# Patient Record
Sex: Female | Born: 1935 | Race: White | Hispanic: No | Smoking: Former smoker
Health system: Southern US, Community
[De-identification: ages and names within clinical notes are randomized; demographics above are authoritative.]

## PROBLEM LIST (undated history)

## (undated) DIAGNOSIS — R911 Solitary pulmonary nodule: Secondary | ICD-10-CM

## (undated) DIAGNOSIS — C50919 Malignant neoplasm of unspecified site of unspecified female breast: Secondary | ICD-10-CM

## (undated) DIAGNOSIS — M1990 Unspecified osteoarthritis, unspecified site: Secondary | ICD-10-CM

## (undated) DIAGNOSIS — G2581 Restless legs syndrome: Secondary | ICD-10-CM

## (undated) DIAGNOSIS — Z860100 Personal history of colon polyps, unspecified: Secondary | ICD-10-CM

## (undated) DIAGNOSIS — E785 Hyperlipidemia, unspecified: Secondary | ICD-10-CM

## (undated) DIAGNOSIS — K219 Gastro-esophageal reflux disease without esophagitis: Secondary | ICD-10-CM

## (undated) DIAGNOSIS — Z9889 Other specified postprocedural states: Secondary | ICD-10-CM

## (undated) DIAGNOSIS — K5790 Diverticulosis of intestine, part unspecified, without perforation or abscess without bleeding: Secondary | ICD-10-CM

## (undated) DIAGNOSIS — Z973 Presence of spectacles and contact lenses: Secondary | ICD-10-CM

## (undated) DIAGNOSIS — G43909 Migraine, unspecified, not intractable, without status migrainosus: Secondary | ICD-10-CM

## (undated) HISTORY — PX: CYSTOCELE REPAIR: SHX163

## (undated) HISTORY — DX: Malignant neoplasm of unspecified site of unspecified female breast: C50.919

## (undated) HISTORY — DX: Restless legs syndrome: G25.81

## (undated) HISTORY — PX: HEMORRHOID SURGERY: SHX153

## (undated) HISTORY — DX: Unspecified osteoarthritis, unspecified site: M19.90

## (undated) HISTORY — DX: Migraine, unspecified, not intractable, without status migrainosus: G43.909

## (undated) HISTORY — DX: Personal history of colonic polyps: Z86.010

## (undated) HISTORY — PX: APPENDECTOMY: SHX54

## (undated) HISTORY — PX: HYSTEROSCOPY: SHX211

## (undated) HISTORY — DX: Diverticulosis of intestine, part unspecified, without perforation or abscess without bleeding: K57.90

## (undated) HISTORY — DX: Other specified postprocedural states: Z98.890

## (undated) HISTORY — DX: Gastro-esophageal reflux disease without esophagitis: K21.9

## (undated) HISTORY — DX: Personal history of colon polyps, unspecified: Z86.0100

## (undated) HISTORY — PX: LYMPH NODE BIOPSY: SHX201

## (undated) HISTORY — PX: OTHER SURGICAL HISTORY: SHX169

## (undated) HISTORY — PX: ERCP: SHX60

## (undated) HISTORY — PX: ABDOMINAL HYSTERECTOMY: SHX81

## (undated) HISTORY — DX: Solitary pulmonary nodule: R91.1

## (undated) HISTORY — PX: SPHINCTEROTOMY: SHX5279

## (undated) HISTORY — DX: Hyperlipidemia, unspecified: E78.5

---

## 1993-09-16 HISTORY — PX: BREAST LUMPECTOMY: SHX2

## 1998-06-12 ENCOUNTER — Ambulatory Visit (HOSPITAL_COMMUNITY): Admission: RE | Admit: 1998-06-12 | Discharge: 1998-06-12 | Payer: Self-pay | Admitting: Oncology

## 1998-06-12 ENCOUNTER — Encounter: Payer: Self-pay | Admitting: Oncology

## 1999-03-15 ENCOUNTER — Encounter (INDEPENDENT_AMBULATORY_CARE_PROVIDER_SITE_OTHER): Payer: Self-pay | Admitting: Specialist

## 1999-03-15 ENCOUNTER — Other Ambulatory Visit: Admission: RE | Admit: 1999-03-15 | Discharge: 1999-03-15 | Payer: Self-pay | Admitting: Internal Medicine

## 1999-05-09 ENCOUNTER — Other Ambulatory Visit: Admission: RE | Admit: 1999-05-09 | Discharge: 1999-05-09 | Payer: Self-pay | Admitting: Obstetrics and Gynecology

## 1999-05-09 ENCOUNTER — Encounter (INDEPENDENT_AMBULATORY_CARE_PROVIDER_SITE_OTHER): Payer: Self-pay

## 1999-05-28 ENCOUNTER — Encounter (INDEPENDENT_AMBULATORY_CARE_PROVIDER_SITE_OTHER): Payer: Self-pay | Admitting: Specialist

## 1999-05-28 ENCOUNTER — Ambulatory Visit (HOSPITAL_COMMUNITY): Admission: RE | Admit: 1999-05-28 | Discharge: 1999-05-28 | Payer: Self-pay | Admitting: Obstetrics and Gynecology

## 1999-05-28 ENCOUNTER — Encounter: Payer: Self-pay | Admitting: Obstetrics and Gynecology

## 1999-06-14 ENCOUNTER — Encounter: Payer: Self-pay | Admitting: Oncology

## 1999-06-14 ENCOUNTER — Ambulatory Visit (HOSPITAL_COMMUNITY): Admission: RE | Admit: 1999-06-14 | Discharge: 1999-06-14 | Payer: Self-pay | Admitting: Oncology

## 2000-02-26 ENCOUNTER — Encounter: Payer: Self-pay | Admitting: Surgery

## 2000-02-26 ENCOUNTER — Encounter: Admission: RE | Admit: 2000-02-26 | Discharge: 2000-02-26 | Payer: Self-pay | Admitting: Surgery

## 2000-05-02 ENCOUNTER — Encounter: Payer: Self-pay | Admitting: Internal Medicine

## 2000-05-02 ENCOUNTER — Ambulatory Visit (HOSPITAL_COMMUNITY): Admission: RE | Admit: 2000-05-02 | Discharge: 2000-05-02 | Payer: Self-pay | Admitting: Internal Medicine

## 2000-08-12 ENCOUNTER — Ambulatory Visit (HOSPITAL_COMMUNITY): Admission: RE | Admit: 2000-08-12 | Discharge: 2000-08-12 | Payer: Self-pay | Admitting: Internal Medicine

## 2000-08-12 ENCOUNTER — Encounter: Payer: Self-pay | Admitting: Internal Medicine

## 2000-08-20 ENCOUNTER — Inpatient Hospital Stay (HOSPITAL_COMMUNITY): Admission: RE | Admit: 2000-08-20 | Discharge: 2000-08-23 | Payer: Self-pay | Admitting: Obstetrics and Gynecology

## 2000-08-20 ENCOUNTER — Encounter (INDEPENDENT_AMBULATORY_CARE_PROVIDER_SITE_OTHER): Payer: Self-pay | Admitting: Specialist

## 2000-11-13 ENCOUNTER — Ambulatory Visit (HOSPITAL_COMMUNITY): Admission: RE | Admit: 2000-11-13 | Discharge: 2000-11-13 | Payer: Self-pay | Admitting: Internal Medicine

## 2000-11-13 ENCOUNTER — Encounter: Payer: Self-pay | Admitting: Internal Medicine

## 2001-04-06 ENCOUNTER — Ambulatory Visit (HOSPITAL_COMMUNITY): Admission: RE | Admit: 2001-04-06 | Discharge: 2001-04-06 | Payer: Self-pay | Admitting: Internal Medicine

## 2001-04-06 ENCOUNTER — Encounter: Payer: Self-pay | Admitting: Internal Medicine

## 2001-08-13 ENCOUNTER — Emergency Department (HOSPITAL_COMMUNITY): Admission: EM | Admit: 2001-08-13 | Discharge: 2001-08-13 | Payer: Self-pay | Admitting: Emergency Medicine

## 2001-08-26 ENCOUNTER — Encounter: Payer: Self-pay | Admitting: Internal Medicine

## 2001-08-26 ENCOUNTER — Ambulatory Visit (HOSPITAL_COMMUNITY): Admission: RE | Admit: 2001-08-26 | Discharge: 2001-08-26 | Payer: Self-pay | Admitting: Internal Medicine

## 2001-11-14 HISTORY — PX: CERVICAL SPINE SURGERY: SHX589

## 2001-11-16 ENCOUNTER — Encounter: Payer: Self-pay | Admitting: Neurosurgery

## 2001-11-19 ENCOUNTER — Inpatient Hospital Stay (HOSPITAL_COMMUNITY): Admission: RE | Admit: 2001-11-19 | Discharge: 2001-11-20 | Payer: Self-pay | Admitting: Neurosurgery

## 2001-11-19 ENCOUNTER — Encounter: Payer: Self-pay | Admitting: Neurosurgery

## 2001-12-16 ENCOUNTER — Encounter: Admission: RE | Admit: 2001-12-16 | Discharge: 2001-12-16 | Payer: Self-pay | Admitting: Neurosurgery

## 2001-12-16 ENCOUNTER — Encounter: Payer: Self-pay | Admitting: Neurosurgery

## 2002-03-30 ENCOUNTER — Encounter: Payer: Self-pay | Admitting: Neurosurgery

## 2002-03-30 ENCOUNTER — Encounter: Admission: RE | Admit: 2002-03-30 | Discharge: 2002-03-30 | Payer: Self-pay | Admitting: Neurosurgery

## 2002-07-30 ENCOUNTER — Ambulatory Visit (HOSPITAL_COMMUNITY): Admission: RE | Admit: 2002-07-30 | Discharge: 2002-07-30 | Payer: Self-pay | Admitting: Internal Medicine

## 2002-07-30 ENCOUNTER — Encounter: Payer: Self-pay | Admitting: Internal Medicine

## 2003-10-28 ENCOUNTER — Ambulatory Visit (HOSPITAL_COMMUNITY): Admission: RE | Admit: 2003-10-28 | Discharge: 2003-10-28 | Payer: Self-pay | Admitting: Internal Medicine

## 2003-10-28 ENCOUNTER — Encounter: Payer: Self-pay | Admitting: Internal Medicine

## 2003-11-08 ENCOUNTER — Ambulatory Visit (HOSPITAL_COMMUNITY): Admission: RE | Admit: 2003-11-08 | Discharge: 2003-11-08 | Payer: Self-pay | Admitting: Internal Medicine

## 2004-01-04 ENCOUNTER — Encounter: Payer: Self-pay | Admitting: Gastroenterology

## 2004-01-04 ENCOUNTER — Observation Stay (HOSPITAL_COMMUNITY): Admission: AD | Admit: 2004-01-04 | Discharge: 2004-01-05 | Payer: Self-pay | Admitting: Gastroenterology

## 2004-04-16 ENCOUNTER — Emergency Department (HOSPITAL_COMMUNITY): Admission: EM | Admit: 2004-04-16 | Discharge: 2004-04-16 | Payer: Self-pay | Admitting: Emergency Medicine

## 2004-08-01 ENCOUNTER — Ambulatory Visit: Payer: Self-pay | Admitting: Internal Medicine

## 2004-09-16 HISTORY — PX: OTHER SURGICAL HISTORY: SHX169

## 2004-11-16 ENCOUNTER — Ambulatory Visit: Payer: Self-pay | Admitting: Internal Medicine

## 2005-03-01 ENCOUNTER — Ambulatory Visit: Payer: Self-pay | Admitting: Internal Medicine

## 2005-03-12 ENCOUNTER — Ambulatory Visit (HOSPITAL_COMMUNITY): Admission: RE | Admit: 2005-03-12 | Discharge: 2005-03-12 | Payer: Self-pay | Admitting: Internal Medicine

## 2005-03-12 ENCOUNTER — Ambulatory Visit: Payer: Self-pay | Admitting: Internal Medicine

## 2005-06-20 ENCOUNTER — Ambulatory Visit: Payer: Self-pay | Admitting: Internal Medicine

## 2005-08-13 ENCOUNTER — Ambulatory Visit: Payer: Self-pay | Admitting: Internal Medicine

## 2005-11-12 ENCOUNTER — Ambulatory Visit: Payer: Self-pay | Admitting: Internal Medicine

## 2005-11-18 ENCOUNTER — Ambulatory Visit: Payer: Self-pay | Admitting: Internal Medicine

## 2006-02-07 ENCOUNTER — Ambulatory Visit: Payer: Self-pay | Admitting: Internal Medicine

## 2006-03-31 ENCOUNTER — Ambulatory Visit: Payer: Self-pay | Admitting: Internal Medicine

## 2006-04-07 ENCOUNTER — Ambulatory Visit: Payer: Self-pay | Admitting: Internal Medicine

## 2006-04-28 ENCOUNTER — Ambulatory Visit: Payer: Self-pay | Admitting: Internal Medicine

## 2006-07-11 ENCOUNTER — Ambulatory Visit: Payer: Self-pay | Admitting: Internal Medicine

## 2006-07-30 ENCOUNTER — Ambulatory Visit: Payer: Self-pay | Admitting: Internal Medicine

## 2006-07-30 LAB — CONVERTED CEMR LAB: Cholesterol: 187 mg/dL (ref 0–200)

## 2006-08-13 ENCOUNTER — Ambulatory Visit: Payer: Self-pay | Admitting: Internal Medicine

## 2006-10-03 ENCOUNTER — Ambulatory Visit: Payer: Self-pay | Admitting: Internal Medicine

## 2007-01-12 ENCOUNTER — Ambulatory Visit: Payer: Self-pay | Admitting: Internal Medicine

## 2007-01-12 LAB — CONVERTED CEMR LAB
Alkaline Phosphatase: 84 units/L (ref 39–117)
Lipase: 23 units/L (ref 11.0–59.0)
Total Bilirubin: 1 mg/dL (ref 0.3–1.2)
Total Protein: 7 g/dL (ref 6.0–8.3)

## 2007-01-15 ENCOUNTER — Ambulatory Visit: Payer: Self-pay | Admitting: Internal Medicine

## 2007-05-28 DIAGNOSIS — Z860100 Personal history of colon polyps, unspecified: Secondary | ICD-10-CM | POA: Insufficient documentation

## 2007-05-28 DIAGNOSIS — K219 Gastro-esophageal reflux disease without esophagitis: Secondary | ICD-10-CM | POA: Insufficient documentation

## 2007-05-28 DIAGNOSIS — Z853 Personal history of malignant neoplasm of breast: Secondary | ICD-10-CM

## 2007-06-01 ENCOUNTER — Ambulatory Visit: Payer: Self-pay | Admitting: Internal Medicine

## 2007-06-01 DIAGNOSIS — E785 Hyperlipidemia, unspecified: Secondary | ICD-10-CM | POA: Insufficient documentation

## 2007-06-01 DIAGNOSIS — G4700 Insomnia, unspecified: Secondary | ICD-10-CM | POA: Insufficient documentation

## 2007-06-01 LAB — CONVERTED CEMR LAB
Blood in Urine, dipstick: NEGATIVE
Nitrite: NEGATIVE
Protein, U semiquant: NEGATIVE
Specific Gravity, Urine: 1.02
WBC Urine, dipstick: NEGATIVE

## 2007-06-08 ENCOUNTER — Telehealth: Payer: Self-pay | Admitting: Internal Medicine

## 2007-06-11 LAB — CONVERTED CEMR LAB
ALT: 27 units/L (ref 0–35)
AST: 29 units/L (ref 0–37)
Basophils Relative: 0.7 % (ref 0.0–1.0)
Bilirubin, Direct: 0.1 mg/dL (ref 0.0–0.3)
CO2: 32 meq/L (ref 19–32)
Calcium: 9.6 mg/dL (ref 8.4–10.5)
Chloride: 110 meq/L (ref 96–112)
Eosinophils Relative: 4.8 % (ref 0.0–5.0)
Glucose, Bld: 105 mg/dL — ABNORMAL HIGH (ref 70–99)
Platelets: 222 10*3/uL (ref 150–400)
RBC: 4.16 M/uL (ref 3.87–5.11)
RDW: 12.9 % (ref 11.5–14.6)
Total CHOL/HDL Ratio: 3
Total Protein: 6.2 g/dL (ref 6.0–8.3)
Triglycerides: 84 mg/dL (ref 0–149)
VLDL: 17 mg/dL (ref 0–40)
WBC: 5.8 10*3/uL (ref 4.5–10.5)

## 2007-07-15 ENCOUNTER — Ambulatory Visit: Payer: Self-pay | Admitting: Internal Medicine

## 2007-07-15 DIAGNOSIS — R634 Abnormal weight loss: Secondary | ICD-10-CM

## 2007-07-20 ENCOUNTER — Ambulatory Visit: Payer: Self-pay | Admitting: Cardiovascular Disease

## 2007-08-04 ENCOUNTER — Telehealth: Payer: Self-pay | Admitting: *Deleted

## 2007-08-31 ENCOUNTER — Ambulatory Visit: Payer: Self-pay | Admitting: Internal Medicine

## 2007-08-31 DIAGNOSIS — F4322 Adjustment disorder with anxiety: Secondary | ICD-10-CM

## 2007-10-07 ENCOUNTER — Ambulatory Visit: Payer: Self-pay | Admitting: Internal Medicine

## 2007-11-11 ENCOUNTER — Telehealth: Payer: Self-pay | Admitting: Internal Medicine

## 2007-11-19 ENCOUNTER — Telehealth: Payer: Self-pay | Admitting: *Deleted

## 2007-11-27 ENCOUNTER — Encounter: Payer: Self-pay | Admitting: Internal Medicine

## 2008-01-28 ENCOUNTER — Telehealth: Payer: Self-pay | Admitting: Internal Medicine

## 2008-01-29 ENCOUNTER — Telehealth: Payer: Self-pay | Admitting: Internal Medicine

## 2008-02-29 ENCOUNTER — Telehealth: Payer: Self-pay | Admitting: Internal Medicine

## 2008-04-18 ENCOUNTER — Ambulatory Visit: Payer: Self-pay | Admitting: Internal Medicine

## 2008-04-18 DIAGNOSIS — L255 Unspecified contact dermatitis due to plants, except food: Secondary | ICD-10-CM

## 2008-04-20 ENCOUNTER — Encounter: Payer: Self-pay | Admitting: Internal Medicine

## 2008-05-03 ENCOUNTER — Telehealth: Payer: Self-pay | Admitting: Internal Medicine

## 2008-05-11 ENCOUNTER — Telehealth: Payer: Self-pay | Admitting: Internal Medicine

## 2008-07-06 ENCOUNTER — Ambulatory Visit: Payer: Self-pay | Admitting: Internal Medicine

## 2008-07-06 DIAGNOSIS — M542 Cervicalgia: Secondary | ICD-10-CM

## 2008-07-06 DIAGNOSIS — R51 Headache: Secondary | ICD-10-CM

## 2008-07-06 DIAGNOSIS — R519 Headache, unspecified: Secondary | ICD-10-CM | POA: Insufficient documentation

## 2008-07-06 DIAGNOSIS — M25559 Pain in unspecified hip: Secondary | ICD-10-CM

## 2008-07-06 DIAGNOSIS — M549 Dorsalgia, unspecified: Secondary | ICD-10-CM | POA: Insufficient documentation

## 2008-07-08 ENCOUNTER — Telehealth: Payer: Self-pay | Admitting: Internal Medicine

## 2008-07-08 LAB — CONVERTED CEMR LAB
ALT: 21 units/L (ref 0–35)
AST: 25 units/L (ref 0–37)
Alkaline Phosphatase: 85 units/L (ref 39–117)
Bilirubin, Direct: 0.1 mg/dL (ref 0.0–0.3)
CO2: 33 meq/L — ABNORMAL HIGH (ref 19–32)
Calcium: 9.5 mg/dL (ref 8.4–10.5)
Chloride: 105 meq/L (ref 96–112)
Glucose, Bld: 91 mg/dL (ref 70–99)
Hemoglobin: 14 g/dL (ref 12.0–15.0)
LDL Cholesterol: 88 mg/dL (ref 0–99)
Lymphocytes Relative: 18.6 % (ref 12.0–46.0)
Monocytes Relative: 9.6 % (ref 3.0–12.0)
Neutro Abs: 4.1 10*3/uL (ref 1.4–7.7)
Neutrophils Relative %: 64.6 % (ref 43.0–77.0)
RBC: 4.32 M/uL (ref 3.87–5.11)
RDW: 12.2 % (ref 11.5–14.6)
Sodium: 144 meq/L (ref 135–145)
Total CHOL/HDL Ratio: 2.6
Total Protein: 6.6 g/dL (ref 6.0–8.3)
Triglycerides: 114 mg/dL (ref 0–149)

## 2008-07-13 ENCOUNTER — Ambulatory Visit: Payer: Self-pay | Admitting: Internal Medicine

## 2008-07-19 ENCOUNTER — Telehealth: Payer: Self-pay | Admitting: Internal Medicine

## 2008-07-20 ENCOUNTER — Ambulatory Visit: Payer: Self-pay | Admitting: Internal Medicine

## 2008-07-21 ENCOUNTER — Telehealth: Payer: Self-pay | Admitting: Internal Medicine

## 2008-07-25 ENCOUNTER — Telehealth: Payer: Self-pay | Admitting: Internal Medicine

## 2008-08-04 DIAGNOSIS — K589 Irritable bowel syndrome without diarrhea: Secondary | ICD-10-CM | POA: Insufficient documentation

## 2008-08-04 DIAGNOSIS — K5730 Diverticulosis of large intestine without perforation or abscess without bleeding: Secondary | ICD-10-CM | POA: Insufficient documentation

## 2008-08-04 DIAGNOSIS — K222 Esophageal obstruction: Secondary | ICD-10-CM

## 2008-08-05 ENCOUNTER — Ambulatory Visit: Payer: Self-pay | Admitting: Internal Medicine

## 2008-08-08 ENCOUNTER — Telehealth: Payer: Self-pay | Admitting: *Deleted

## 2008-08-15 ENCOUNTER — Encounter: Payer: Self-pay | Admitting: Internal Medicine

## 2008-08-29 ENCOUNTER — Ambulatory Visit: Payer: Self-pay | Admitting: Internal Medicine

## 2008-09-01 ENCOUNTER — Telehealth: Payer: Self-pay | Admitting: Internal Medicine

## 2008-09-12 ENCOUNTER — Telehealth: Payer: Self-pay | Admitting: Internal Medicine

## 2008-09-13 ENCOUNTER — Encounter: Payer: Self-pay | Admitting: Internal Medicine

## 2008-11-08 ENCOUNTER — Telehealth: Payer: Self-pay | Admitting: *Deleted

## 2008-11-22 ENCOUNTER — Telehealth: Payer: Self-pay | Admitting: *Deleted

## 2008-12-08 ENCOUNTER — Encounter: Payer: Self-pay | Admitting: Internal Medicine

## 2008-12-21 ENCOUNTER — Encounter: Payer: Self-pay | Admitting: Internal Medicine

## 2008-12-30 ENCOUNTER — Telehealth: Payer: Self-pay | Admitting: Internal Medicine

## 2009-01-27 ENCOUNTER — Encounter: Payer: Self-pay | Admitting: Internal Medicine

## 2009-02-21 ENCOUNTER — Ambulatory Visit: Payer: Self-pay | Admitting: Internal Medicine

## 2009-02-21 DIAGNOSIS — R195 Other fecal abnormalities: Secondary | ICD-10-CM

## 2009-02-22 LAB — CONVERTED CEMR LAB
ALT: 24 units/L (ref 0–35)
AST: 27 units/L (ref 0–37)
Albumin: 4.1 g/dL (ref 3.5–5.2)
Alkaline Phosphatase: 76 units/L (ref 39–117)
Bilirubin, Direct: 0.2 mg/dL (ref 0.0–0.3)
Total Protein: 6.9 g/dL (ref 6.0–8.3)

## 2009-04-14 ENCOUNTER — Telehealth: Payer: Self-pay | Admitting: *Deleted

## 2009-06-13 ENCOUNTER — Telehealth: Payer: Self-pay | Admitting: *Deleted

## 2009-06-21 ENCOUNTER — Telehealth: Payer: Self-pay | Admitting: Internal Medicine

## 2009-07-04 ENCOUNTER — Ambulatory Visit: Payer: Self-pay | Admitting: Internal Medicine

## 2009-08-02 ENCOUNTER — Telehealth: Payer: Self-pay | Admitting: *Deleted

## 2009-10-27 ENCOUNTER — Ambulatory Visit: Payer: Self-pay | Admitting: Internal Medicine

## 2009-10-27 DIAGNOSIS — M1990 Unspecified osteoarthritis, unspecified site: Secondary | ICD-10-CM | POA: Insufficient documentation

## 2009-10-27 LAB — CONVERTED CEMR LAB
Albumin: 3.9 g/dL (ref 3.5–5.2)
BUN: 20 mg/dL (ref 6–23)
Basophils Absolute: 0 10*3/uL (ref 0.0–0.1)
Basophils Relative: 0.6 % (ref 0.0–3.0)
CO2: 31 meq/L (ref 19–32)
Chloride: 105 meq/L (ref 96–112)
Cholesterol: 178 mg/dL (ref 0–200)
Creatinine, Ser: 0.9 mg/dL (ref 0.4–1.2)
Eosinophils Absolute: 0.3 10*3/uL (ref 0.0–0.7)
Hemoglobin: 13.6 g/dL (ref 12.0–15.0)
LDL Cholesterol: 87 mg/dL (ref 0–99)
Lymphocytes Relative: 26.9 % (ref 12.0–46.0)
Monocytes Relative: 10.2 % (ref 3.0–12.0)
Neutro Abs: 3 10*3/uL (ref 1.4–7.7)
Neutrophils Relative %: 56.5 % (ref 43.0–77.0)
Potassium: 4.5 meq/L (ref 3.5–5.1)
RBC: 4.28 M/uL (ref 3.87–5.11)
TSH: 3.37 microintl units/mL (ref 0.35–5.50)
Total CHOL/HDL Ratio: 2
Total Protein: 6.4 g/dL (ref 6.0–8.3)
Triglycerides: 60 mg/dL (ref 0.0–149.0)
VLDL: 12 mg/dL (ref 0.0–40.0)

## 2009-11-29 ENCOUNTER — Ambulatory Visit: Payer: Self-pay | Admitting: Internal Medicine

## 2009-12-11 ENCOUNTER — Encounter: Payer: Self-pay | Admitting: Internal Medicine

## 2009-12-22 ENCOUNTER — Ambulatory Visit: Payer: Self-pay | Admitting: Internal Medicine

## 2009-12-22 LAB — CONVERTED CEMR LAB
OCCULT 1: NEGATIVE
OCCULT 2: NEGATIVE
OCCULT 3: NEGATIVE

## 2009-12-26 ENCOUNTER — Encounter: Payer: Self-pay | Admitting: *Deleted

## 2010-01-26 ENCOUNTER — Telehealth: Payer: Self-pay | Admitting: *Deleted

## 2010-01-29 ENCOUNTER — Telehealth: Payer: Self-pay | Admitting: *Deleted

## 2010-01-30 ENCOUNTER — Encounter: Payer: Self-pay | Admitting: Internal Medicine

## 2010-01-31 ENCOUNTER — Telehealth: Payer: Self-pay | Admitting: Internal Medicine

## 2010-01-31 ENCOUNTER — Encounter: Payer: Self-pay | Admitting: Internal Medicine

## 2010-02-07 ENCOUNTER — Encounter (INDEPENDENT_AMBULATORY_CARE_PROVIDER_SITE_OTHER): Payer: Self-pay | Admitting: *Deleted

## 2010-02-28 ENCOUNTER — Telehealth: Payer: Self-pay | Admitting: *Deleted

## 2010-03-06 ENCOUNTER — Ambulatory Visit: Payer: Self-pay | Admitting: Internal Medicine

## 2010-03-06 DIAGNOSIS — R209 Unspecified disturbances of skin sensation: Secondary | ICD-10-CM | POA: Insufficient documentation

## 2010-03-07 ENCOUNTER — Ambulatory Visit: Payer: Self-pay | Admitting: Internal Medicine

## 2010-03-26 ENCOUNTER — Telehealth: Payer: Self-pay | Admitting: *Deleted

## 2010-03-28 ENCOUNTER — Encounter: Admission: RE | Admit: 2010-03-28 | Discharge: 2010-03-28 | Payer: Self-pay | Admitting: Neurosurgery

## 2010-04-03 ENCOUNTER — Encounter: Admission: RE | Admit: 2010-04-03 | Discharge: 2010-04-03 | Payer: Self-pay | Admitting: Neurosurgery

## 2010-06-06 ENCOUNTER — Ambulatory Visit: Payer: Self-pay | Admitting: Internal Medicine

## 2010-06-12 LAB — CONVERTED CEMR LAB
AST: 27 units/L (ref 0–37)
Albumin: 4 g/dL (ref 3.5–5.2)
Alkaline Phosphatase: 113 units/L (ref 39–117)
Total Protein: 6.3 g/dL (ref 6.0–8.3)

## 2010-06-18 ENCOUNTER — Telehealth: Payer: Self-pay | Admitting: *Deleted

## 2010-07-02 ENCOUNTER — Ambulatory Visit: Payer: Self-pay | Admitting: Internal Medicine

## 2010-07-02 DIAGNOSIS — R1011 Right upper quadrant pain: Secondary | ICD-10-CM

## 2010-07-02 LAB — CONVERTED CEMR LAB
ALT: 29 units/L (ref 0–35)
Basophils Absolute: 0 10*3/uL (ref 0.0–0.1)
Basophils Relative: 0.6 % (ref 0.0–3.0)
Eosinophils Absolute: 0.3 10*3/uL (ref 0.0–0.7)
Glucose, Urine, Semiquant: NEGATIVE
Lipase: 30 units/L (ref 11.0–59.0)
Lymphocytes Relative: 24.1 % (ref 12.0–46.0)
MCHC: 34.3 g/dL (ref 30.0–36.0)
MCV: 94.3 fL (ref 78.0–100.0)
Monocytes Absolute: 0.7 10*3/uL (ref 0.1–1.0)
Neutrophils Relative %: 60 % (ref 43.0–77.0)
Nitrite: NEGATIVE
Platelets: 206 10*3/uL (ref 150.0–400.0)
RBC: 4.31 M/uL (ref 3.87–5.11)
RDW: 13.9 % (ref 11.5–14.6)
Specific Gravity, Urine: 1.015
Total Bilirubin: 0.6 mg/dL (ref 0.3–1.2)
WBC Urine, dipstick: NEGATIVE
pH: 7

## 2010-07-06 ENCOUNTER — Encounter: Admission: RE | Admit: 2010-07-06 | Discharge: 2010-07-06 | Payer: Self-pay | Admitting: Internal Medicine

## 2010-07-19 ENCOUNTER — Ambulatory Visit: Payer: Self-pay | Admitting: Internal Medicine

## 2010-08-27 ENCOUNTER — Encounter: Payer: Self-pay | Admitting: Internal Medicine

## 2010-08-27 ENCOUNTER — Telehealth: Payer: Self-pay | Admitting: Internal Medicine

## 2010-08-28 ENCOUNTER — Telehealth: Payer: Self-pay | Admitting: *Deleted

## 2010-09-16 HISTORY — PX: CHOLECYSTECTOMY: SHX55

## 2010-10-08 LAB — COMPREHENSIVE METABOLIC PANEL
Alkaline Phosphatase: 100 U/L (ref 39–117)
BUN: 15 mg/dL (ref 6–23)
CO2: 28 mEq/L (ref 19–32)
Chloride: 104 mEq/L (ref 96–112)
Creatinine, Ser: 0.96 mg/dL (ref 0.4–1.2)
GFR calc non Af Amer: 57 mL/min — ABNORMAL LOW (ref >60)
Glucose, Bld: 92 mg/dL (ref 70–99)
Potassium: 5.1 mEq/L (ref 3.5–5.1)
Total Bilirubin: 0.4 mg/dL (ref 0.3–1.2)

## 2010-10-08 LAB — CBC
HCT: 40.8 % (ref 36.0–46.0)
MCH: 31.1 pg (ref 26.0–34.0)
MCHC: 33.1 g/dL (ref 30.0–36.0)
MCV: 94 fL (ref 78.0–100.0)
RDW: 13.5 % (ref 11.5–15.5)

## 2010-10-08 LAB — SURGICAL PCR SCREEN: MRSA, PCR: NEGATIVE

## 2010-10-08 LAB — DIFFERENTIAL
Basophils Absolute: 0 10*3/uL (ref 0.0–0.1)
Eosinophils Relative: 4 % (ref 0–5)
Lymphocytes Relative: 25 % (ref 12–46)
Lymphs Abs: 1.5 10*3/uL (ref 0.7–4.0)
Monocytes Absolute: 0.6 10*3/uL (ref 0.1–1.0)
Monocytes Relative: 11 % (ref 3–12)

## 2010-10-12 ENCOUNTER — Ambulatory Visit (HOSPITAL_COMMUNITY)
Admission: RE | Admit: 2010-10-12 | Discharge: 2010-10-14 | Payer: Self-pay | Source: Home / Self Care | Attending: General Surgery | Admitting: General Surgery

## 2010-10-16 NOTE — Op Note (Signed)
Jeanne Tran, Jeanne Tran            ACCOUNT NO.:  [id number]  MEDICAL RECORD NO.:  [id number]          PATIENT TYPE:  AMB  LOCATION:  DAY                          FACILITY:  [facility]  PHYSICIAN:  [name], M.D.DATE OF BIRTH:  1934-08-15  DATE OF PROCEDURE: DATE OF DISCHARGE:                              OPERATIVE REPORT   PREOPERATIVE DIAGNOSIS:  Symptomatic cholelithiasis.  POSTOPERATIVE DIAGNOSIS:  Symptomatic cholelithiasis.  PROCEDURE:  Laparoscopic cholecystectomy with cholangiogram.  SURGEON:  [name], M.D.  ASSISTANT:  Jeanella Anton, MD  ANESTHESIA:  General.  INDICATIONS:  Ms. Batch is a 77 year old female who has a history of problems with choledocholithiasis.  In 2006, she underwent an ERCP and sphincterotomy.  She has known gallstones and common bile duct diameter is chronically dilated about 10 mm.  Liver function tests were normal. She now presents for elective cholecystectomy.  We discussed the procedure, risks and aftercare preoperatively.  TECHNIQUE:  She was brought to the operating room, placed supine on the operating table, and a general anesthetic was administered.  The abdominal wall was widely sterilely prepped and draped.  In the subumbilical region, Marcaine solution was infiltrated with a local anesthetic effect superficially deep.  A small subumbilical incision was made through the skin and subcutaneous tissues where the fascia was identified.  A small incision was made in the midline fascia. The fascia was grasped and retracted anteriorly.  The peritoneal cavity was then entered under direct vision.  A pursestring suture of 0 Vicryl was placed around the fascial edges.  A Hasson trocar was introduced to the peritoneal cavity, and pneumoperitoneum created by insufflation of CO2 gas.  Next the laparoscope was introduced, and there was no underlying bleeding or organ injury noted.  She was placed in reverse Trendelenburg  position with the right side tilted slightly up.  A 5-mm trocar was placed through subxiphoid incision and two 5-mm trocars placed in the right upper quadrant.  The gallbladder was identified, and there were no acute inflammatory changes.  The fundus of the gallbladder was grasped and retracted to the right shoulder.  The infundibulum was grasped using blunt dissection on the infundibulum, and I mobilized it and it was retracted laterally.  I identified the cystic duct, and using blunt dissection, created a window around it and a critical view was achieved.  A clip was placed at the cystic duct gallbladder junction.  A small incision was made into the cystic duct, and bile milked back.  A cholangiocath was passed to the anterior abdominal wall and placed into the cystic duct and cholangiogram performed.  Under real time fluoroscopy, dilute contrast was injected into the cystic duct.  The common hepatic, right and left hepatic, and common bile ducts all filled promptly.  There was some dilatation of common bile duct.  Contrast drained into the duodenum without obvious evidence of obstruction.  There was some scalloping of the distal common bile duct.  Final reports pending the radiologist's interpretation.  Following this, I removed the cholangiocatheter, the cystic duct was clipped three times on the biliary side, and it was divided.  I identified an anterior branch of  the cystic artery and created a window around, and it was clipped and divided.  A posterior branch of the cystic artery was identified.  A window was created around it, and it was clipped and divided.  The gallbladder was then dissected from the liver using electrocautery and tacked. The gallbladder fossa was inspected and copiously irrigated and bleeding points controlled with electrocautery.  Further inspection demonstrated no evidence of bleeding and no evidence of bile leak.  The gallbladder was then placed in  an Endopouch bag and removed through the subumbilical port.  I evacuated fluid as much as possible.  I once again inspected the gallbladder fossa.  No bleeding or bile leak was noticed.  I then closed the subumbilical fascial defect under laparoscopic vision by tightening up and tying down the pursestring suture.  The CO2 gas was released, and the trocars were removed.  All skin incisions were closed with 4-0 Monocryl subcuticular stitches.  Steri-Strips and sterile dressings were applied.  She tolerated the procedure well without any apparent complications and was taken to recovery room in satisfactory condition.     [name], M.D.     Katina Degree  D:  10/12/2010  T:  10/12/2010  Job:  [id number]  cc:   Standley Brooking. Regis Bill, MD [place], [place] [address]  Lowella Bandy. Olevia Perches, [place] [address]  Electronically Signed by [name] M.D. on 10/16/2010 09:21:38 AM

## 2010-10-18 NOTE — Progress Notes (Signed)
Summary: refills on celebrex and nasacort sent to [facility]  Phone Note Call from Patient Call back at Home Phone [phone]   Caller: Patient Summary of Call: Pt would like refills on celebrex and nasacort sent to [facility] Initial call taken by: Sherron Monday, CMA (AAMA),  March 26, 2010 4:56 PM  Follow-up for Phone Call        ok to do this  ( celebrex x 6 months and nasocort for 12 months) Follow-up by: Burnis Medin MD,  March 26, 2010 4:57 PM  Additional Follow-up for Phone Call Additional follow up Details #1::        Rx faxed electronically. Additional Follow-up by: Sherron Monday, CMA (AAMA),  March 26, 2010 5:08 PM    Prescriptions: NASACORT AQ 55 MCG/ACT AERS (TRIAMCINOLONE ACETONIDE(NASAL)) Spray 2 spray into both nostrils once a day  #3 x 3   Entered by:   Sherron Monday, CMA (AAMA)   Authorized by:   Burnis Medin MD   Signed by:   Sherron Monday, CMA (AAMA) on 03/26/2010   Method used:   Faxed to ...       [facility] (retail)             ,          Ph: [phone]       Fax: [phone]   RxIDWU:[id number] CELEBREX 200 MG CAPS (CELECOXIB) Take 1 capsule by mouth once a day  #90 x 1   Entered by:   Sherron Monday, CMA ([place])   Authorized by:   Burnis Medin MD   Signed by:   Sherron Monday, CMA (AAMA) on 03/26/2010   Method used:   Faxed to ...       [facility] (retail)             ,          Ph: [phone]       Fax: [phone]   RxID:   [id number]

## 2010-10-18 NOTE — Progress Notes (Signed)
Summary: refills   Phone Note Call from Patient Call back at Home Phone [phone]   Caller: Patient Summary of Call: refills on ambien and vagifem Initial call taken by: Sherron Monday, [place] Deborra Medina),  August 27, 2010 4:44 PM  Follow-up for Phone Call        rx mailed to pharmacy. Follow-up by: Sherron Monday, CMA (AAMA),  August 27, 2010 4:45 PM    Prescriptions: VAGIFEM 10 MCG TABS (ESTRADIOL) use 3 times a week  #36 x 3   Entered by:   Sherron Monday, CMA (AAMA)   Authorized by:   Burnis Medin MD   Signed by:   Sherron Monday, CMA (AAMA) on 08/27/2010   Method used:   Printed then faxed to ...       [facility] (retail)             ,          Ph: [phone]       Fax: [phone]   [id number] AMBIEN 10 MG TABS (ZOLPIDEM TARTRATE) 1 1/2 by mouth at bedtime Brand medically necessary #135 x 1   Entered by:   Sherron Monday, CMA (AAMA)   Authorized by:   Burnis Medin MD   Signed by:   Sherron Monday, CMA (AAMA) on 08/27/2010   Method used:   Printed then faxed to ...       [facility] (retail)             ,          Ph: [phone]       Fax: [phone]   RxID:   [id number]

## 2010-10-18 NOTE — Medication Information (Signed)
Summary: Coverage Approval for Ambien  Coverage Approval for Ambien   Imported By: Laural Benes 02/05/2010 12:21:22  _____________________________________________________________________  External Attachment:    Type:   Image     Comment:   External Document

## 2010-10-18 NOTE — Assessment & Plan Note (Signed)
Summary: annual visit/ssc   Vital Signs:  Patient profile:   77 year old female Menstrual status:  hysterectomy Height:      65 inches Weight:      159 pounds BMI:     26.55 Pulse rate:   72 / minute BP sitting:   120 / 80  (left arm) Cuff size:   regular  Vitals Entered By: Sherron Monday, CMA (AAMA) (November 29, 2009 11:09 AM)  Contraindications/Deferment of Procedures/Staging:    Test/Procedure: PAP Smear    Reason for deferment: hysterectomy  CC: Annual visit for disease management-Pt wants to discuss increasing zoloft     Menstrual Status hysterectomy   History of Present Illness: Jeanne Tran comesin today for   multiple medical problems  management .  Since her last visit she has done failry well without major change of med status.   LIPIds  NO se of meds  Allergies : zyrtec some help   and nose spray  year round.  GERD:  on nexium  forgets some.   about 3-4 per week.   then signs come back . Sleep :   brand  Lorrin Mais  helps best.  GI  on hypocyamine Er  two times a day per brodie   Mood:  would like to increase  stress some out of sorts.    Some anxiety .  mammogram   to do in march   To get  tooth extracted .    Preventive Care Screening  Mammogram:    Date:  11/14/2008    Results:  normal   Prior Values:    Colonoscopy:  Location:  [facility].   (03/12/2005)    Last Tetanus Booster:  Historical (09/16/2001)    Last Flu Shot:  Historical (09/16/1997)   Preventive Screening-Counseling & Management  Alcohol-Tobacco     Alcohol drinks/day: 2     Alcohol type: wine     Smoking Status: quit     Year Quit: 1990  Caffeine-Diet-Exercise     Caffeine use/day: 2     Does Patient Exercise: no  Hep-HIV-STD-Contraception     Dental Visit-last 6 months yes     Sun Exposure-Excessive: no  Safety-Violence-Falls     Seat Belt Use: 100     Smoke Detectors: yes  EKG  Procedure date:  11/29/2009  Findings:      Normal sinus rhythm with  rate of:  73, Left axis deviation- anterior fascicular block. Poor R wave progression- probable normal variant.  Current Medications (verified): 1)  Ambien 10 Mg Tabs (Zolpidem Tartrate) .Marland Kitchen.. 1 1/2 By Mouth At Bedtime 2)  Celebrex 200 Mg Caps (Celecoxib) .... Take 1 Capsule By Mouth Once A Day 3)  Nasacort Aq 55 Mcg/act Aers (Triamcinolone Acetonide(Nasal)) .... Spray 2 Spray Into Both Nostrils Once A Day 4)  Nexium 40 Mg  Cpdr (Esomeprazole Magnesium) .Marland Kitchen.. 1 By Mouth Once Daily 5)  Hydroxyzine Hcl 25 Mg  Tabs (Hydroxyzine Hcl) .Marland Kitchen.. 1 By Mouth At Bedtime 6)  Vagifem 10 Mcg Tabs (Estradiol) .... Use 3 Times A Week 7)  Flomax 0.4 Mg  Cp24 (Tamsulosin Hcl) .Marland Kitchen.. 1 By Mouth Twice A Week 8)  Biotin Forte 3 Mg  Tabs (Biotin) .... Once Daily 9)  Eq Fiber Therapy 0.52 Gm  Caps (Psyllium) .... Once Daily 10)  Multi-Vitamin   Tabs (Multiple Vitamin) .... Once Daily 11)  Fish Oil   Oil (Fish Oil) .... Once Daily 12)  Calcium 500/d 500-125 Mg-Unit  Tabs (  Calcium Carbonate-Vitamin D) .... Once Daily 13)  Crestor 5 Mg  Tabs (Rosuvastatin Calcium) .Marland Kitchen.. 1 By Mouth Once Daily 14)  Zoloft 50 Mg  Tabs (Sertraline Hcl) .Marland Kitchen.. 1 By Mouth Once Daily or As Directed 15)  Hyoscyamine Sulfate Cr 0.375 Mg Xr12h-Tab (Hyoscyamine Sulfate) .Marland Kitchen.. 1 By Mouth Two Times A Day  Allergies (verified): 1)  ! Codeine  Past History:  Past medical, surgical, family and social histories (including risk factors) reviewed, and no changes noted (except as noted below).  Past Medical History: Reviewed history from 07/06/2008 and no changes required. High Cholesterol RLS Breast cancer, hx of left with radiation and lumpectomy Colonic polyps, hx of GERD ercp  and sphincterotomy  for abnormal lfts and dilated biliary ducts 5/05 Pulmonary nodules stable childbirth x 3           LAST Mammogram: 1/09 Pap: Hyst Td: 2003 Colonscopy: 2006 EKG: Dexa: 2009  Past Surgical History: Reviewed history from 08/31/2007 and no changes  required. Lymph Node Surgery Cystocele Repair CB x3 Colonoscopy Appendectomy Hemorrhoidectomy Lumpectomy Tonsillectomy Hysterectomy C spine surgery  3/03  Past History:  Care Management: Surgery: [facility] Gastroenterology: Olevia Perches  Family History: Reviewed history from 07/06/2008 and no changes required. Family History Other cancer - Esophageal Family History of Cardiovascular disorder    Social History: Reviewed history from 07/06/2008 and no changes required. Retired Single widowed Never Smoked Alcohol use-yes   new International aid/development worker   hhof 1    Caffeine use/day:  2 Dental Care w/in 6 mos.:  yes Sun Exposure-Excessive:  no  Review of Systems  The patient denies anorexia, fever, weight loss, weight gain, decreased hearing, hoarseness, chest pain, syncope, dyspnea on exertion, peripheral edema, prolonged cough, headaches, hemoptysis, abdominal pain, melena, hematochezia, severe indigestion/heartburn, hematuria, muscle weakness, transient blindness, difficulty walking, depression, unusual weight change, abnormal bleeding, enlarged lymph nodes, angioedema, and breast masses.   Physical Exam General Appearance: well developed, well nourished, no acute distress Eyes: conjunctiva and lids normal, PERRLA, EOMI, WNL Ears, Nose, Mouth, Throat: TM clear, nares clear, oral exam WNL Neck: supple, no lymphadenopathy, no thyromegaly, no JVD Respiratory: clear to auscultation and percussion, respiratory effort normal Cardiovascular: regular rate and rhythm, S1-S2, no murmur, rub or gallop, no bruits, peripheral pulses normal and symmetric, no cyanosis, clubbing, edema or varicosities Chest: no scars, masses, tenderness; no asymmetry, skin changes, nipple discharge   Gastrointestinal: soft, non-tender; no hepatosplenomegaly, masses; active bowel sounds all quadrants,  rectal per gi  Lymphatic: no cervical, axillary or inguinal adenopathy Musculoskeletal: gait normal, muscle tone and strength WNL,  no joint swelling, effusions, discoloration, crepitus  Skin: clear, good turgor, color WNL, no rashes, lesions, or ulcerations Neurologic: normal mental status, normal reflexes, normal strength, sensation, and motion Psychiatric: alert; oriented to person, place and time Other Exam:  see labs  and ekg     sinus rhythm  LAD  no acute changes.     Impression & Recommendations:  Problem # 1:  DEGENERATIVE JOINT DISEASE (ICD-715.90) meds lfts to monitor no sig se of meds  stool cards given  Her updated medication list for this problem includes:    Celebrex 200 Mg Caps (Celecoxib) .Marland Kitchen... Take 1 capsule by mouth once a day  Problem # 2:  Hx of ESOPHAGEAL STRICTURE (ICD-530.3) continue on meds  nexium   Problem # 3:  INSOMNIA, CHRONIC (ICD-307.42) stable otherwise on brand name ambien  Problem # 4:  HYPERLIPIDEMIA (B2193296.4)  Her updated medication list for this problem includes:  Crestor 5 Mg Tabs (Rosuvastatin calcium) .Marland Kitchen... 1 by mouth once daily  Labs Reviewed: SGOT: 22 (10/27/2009)   SGPT: 21 (10/27/2009)   HDL:79.50 (10/27/2009), 70.6 (07/06/2008)  LDL:87 (10/27/2009), 88 (07/06/2008)  Chol:178 (10/27/2009), 181 (07/06/2008)  Trig:60.0 (10/27/2009), 114 (07/06/2008)  Orders: EKG w/ Interpretation (93000)  Problem # 5:  GERD (ICD-530.81)  Her updated medication list for this problem includes:    Nexium 40 Mg Cpdr (Esomeprazole magnesium) .Marland Kitchen... 1 by mouth once daily    Hyoscyamine Sulfate Cr 0.375 Mg Xr12h-tab (Hyoscyamine sulfate) .Marland Kitchen... 1 by mouth two times a day  Problem # 6:  BREAST CANCER, HX OF (ICD-V10.3)  Problem # 7:  COLONIC POLYPS, HX OF (ICD-V12.72) stool cards today  given   Problem # 8:  ibs on as needed levsin    Problem # 9:  ADJUSTMENT DISORDER WITH ANXIOUS MOOD (ICD-309.24)  some what sounds lile poop out and some life related issues aggravating  no alarm signs  . agree with increase meds dose.   Orders: Prescription Created Electronically  [phone])  Complete Medication List: 1)  Ambien 10 Mg Tabs (Zolpidem tartrate) .Marland Kitchen.. 1 1/2 by mouth at bedtime 2)  Celebrex 200 Mg Caps (Celecoxib) .... Take 1 capsule by mouth once a day 3)  Nasacort Aq 55 Mcg/act Aers (Triamcinolone acetonide(nasal)) .... Spray 2 spray into both nostrils once a day 4)  Nexium 40 Mg Cpdr (Esomeprazole magnesium) .Marland Kitchen.. 1 by mouth once daily 5)  Hydroxyzine Hcl 25 Mg Tabs (Hydroxyzine hcl) .Marland Kitchen.. 1 by mouth at bedtime 6)  Vagifem 10 Mcg Tabs (Estradiol) .... Use 3 times a week 7)  Flomax 0.4 Mg Cp24 (Tamsulosin hcl) .Marland Kitchen.. 1 by mouth twice a week 8)  Biotin Forte 3 Mg Tabs (Biotin) .... Once daily 9)  Eq Fiber Therapy 0.52 Gm Caps (Psyllium) .... Once daily 10)  Multi-vitamin Tabs (Multiple vitamin) .... Once daily 11)  Fish Oil Oil (Fish oil) .... Once daily 12)  Calcium 500/d 500-125 Mg-unit Tabs (Calcium carbonate-vitamin d) .... Once daily 13)  Crestor 5 Mg Tabs (Rosuvastatin calcium) .Marland Kitchen.. 1 by mouth once daily 14)  Zoloft 50 Mg Tabs (Sertraline hcl) .Marland Kitchen.. 1 by mouth once daily or as directed 15)  Hyoscyamine Sulfate Cr 0.375 Mg Xr12h-tab (Hyoscyamine sulfate) .Marland Kitchen.. 1 by mouth two times a day 16)  Sertraline Hcl 100 Mg Tabs (Sertraline hcl) .Marland Kitchen.. 1 by mouth once daily or as directed  Patient Instructions: 1)  increase zoloft as discussed  . 2)  return office visit  in a month    if not better . otherwise .    3)  ROV   in 12 months.  4)  LFTS  in 6 months  " moinitor  high risk meds " Prescriptions: CRESTOR 5 MG  TABS (ROSUVASTATIN CALCIUM) 1 by mouth once daily  #90 x 3   Entered and Authorized by:   Burnis Medin MD   Signed by:   Burnis Medin MD on 11/29/2009   Method used:   Electronically to        [facility] (mail-order)             ,          Ph: [phone]       Fax: [phone]   RxID:   [id number] AMBIEN 10 MG TABS (ZOLPIDEM TARTRATE) 1 1/2 by mouth at bedtime Brand medically necessary #135 x 1   Entered and Authorized by:    Burnis Medin MD  Signed by:   Burnis Medin MD on 11/29/2009   Method used:   Print then Give to Patient   RxID:   [id number] SERTRALINE HCL 100 MG TABS (SERTRALINE HCL) 1 by mouth once daily or as directed  #90 x 3   Entered and Authorized by:   Burnis Medin MD   Signed by:   Burnis Medin MD on 11/29/2009   Method used:   Electronically to        [facility] (mail-order)             ,          Ph: [phone]       Fax: [phone]   RxID:   [id number]

## 2010-10-18 NOTE — Progress Notes (Signed)
Summary: FYI-  Phone Note Call from Patient Call back at [facility] Phone [phone]   Caller: Patient Summary of Call: Pt called and said that she had neck surgery in the past. She is now having her neck tingle but it goes away when she turns her neck. Pt is wondering if she should call Dr. Saintclair Halsted or see [place] first. I told pt to call Dr. Saintclair Halsted since he done the surgery to see what they say then if they tell her to see [place] then she can make an appt. Initial call taken by: Sherron Monday, [place] Deborra Medina),  February 28, 2010 1:12 PM  Follow-up for Phone Call        actually her surgery was 8 years ago so I think she should see Dr. Regis Bill about this to work it up a little first Follow-up by: Laurey Morale MD,  February 28, 2010 1:17 PM  Additional Follow-up for Phone Call Additional follow up Details #1::        Pt aware and appt made. Additional Follow-up by: Sherron Monday, CMA (AAMA),  February 28, 2010 1:17 PM

## 2010-10-18 NOTE — Progress Notes (Signed)
Summary: prior auth  Phone Note Call from Patient Call back at [facility] Phone [phone]   Caller: Patient Summary of Call: Pt called back saying that [facility] couldn't call out to call us about doing a prior auth. I have left her a message to call back in reguards to this.  Initial call taken by: Sherron Monday, CMA Deborra Medina),  Jan 29, 2010 3:18 PM  Follow-up for Phone Call        Spoke to pt and told her to tell [facility] to fax [place] the prior auth form. Pt is aware and has our fax number. She is going to see what they can do about this. Follow-up by: Sherron Monday, CMA (AAMA),  Jan 29, 2010 5:08 PM

## 2010-10-18 NOTE — Progress Notes (Signed)
Summary: Pt req to get a referral to Dr Olevia Perches re: gall bladder  Phone Note Call from Patient Call back at Home Phone [phone]   Caller: Patient Summary of Call: Pt is having gall bladder problems and is req a referral to Dr. Olevia Perches. Pls advise.  Initial call taken by: Braulio Bosch,  June 18, 2010 4:01 PM  Follow-up for Phone Call        Spoke to pt and she is having RUQ pain and then sometimes it moves to the back. This has been going on for 4 days. Pt had the gallbladder duct open 4 years ago. Pt has seen Dr. Olevia Perches in the past but thought we could get her in soonier. Pt is also going to call them herself to see when she can get in.  Pt called saying that she can't see Dr. Olevia Perches until 11/3 Follow-up by: Sherron Monday, [place] Deborra Medina),  June 18, 2010 4:13 PM  Additional Follow-up for Phone Call Additional follow up Details #1::        We can do an ov   Additional Follow-up by: Burnis Medin MD,  June 19, 2010 4:58 PM    Additional Follow-up for Phone Call Additional follow up Details #2::    Offered appt on 10/10 but pt wanted to wait until after her trip. She leaves on 10/11 and will return on 10/15. Pt to come in on 10/17 in am. I told pt to call us if she gets worse. Follow-up by: Sherron Monday, CMA (AAMA),  June 20, 2010 9:19 AM

## 2010-10-18 NOTE — Assessment & Plan Note (Signed)
Summary: abd pain--ch.    History of Present Illness Visit Type: Follow-up Visit Primary GI MD: Delfin Edis MD Primary Tamarra Geiselman: Shanon Ace, MD Chief Complaint: follow-up RUQ pain 2 episodes of N&V  Pt. wants to know if she has to have colonoscopy (received recall letter in mail) History of Present Illness:   This is a 77 year old white female with right upper quadrant abdominal pain of several weeks duration. She has a long history of intermittent abdominal pain attributed to choledocholithiasis. She has a strong family history of gallbladder disease in her grandmother as well as in her mother and 2 half-sisters. An upper abdominal ultrasound in 2008 showed s normal gallbladder with a 2.6 mm gallbladder wall. A CT Scan of the abdomen in November 2008 showed a normal gallbladder and pancreas. Her most recent upper abdominal ultrasound in October 2011 showed cholelithiasis with a common bile duct of 10 mm. Her liver function tests have been normal as well as her amylase and lipase. In 2006, she had a severe attack of abdominal pain and underwent an ERCP and sphincterotomy with an 8.5 mm balloon passage through the common bile duct.Her LFT's at that time were markedly elevated.  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 The attacks became very mild and less frequent after the shincterotomy.She denies jaundice or fever. Colonoscopies in 2000, 2003 and 2006 showed a tortuous colon and hyperplastic polyp. An upper endoscopy  in February 2005 showed a mild esophageal stricture.   GI Review of Systems    Reports abdominal pain, nausea, and  vomiting.     Location of  Abdominal pain: RUQ.    Denies acid reflux, belching, bloating, chest pain, dysphagia with liquids, dysphagia with solids, heartburn, loss of appetite, vomiting blood, weight loss, and  weight gain.        Denies anal fissure, black tarry stools, change in bowel habit, constipation, diarrhea, diverticulosis, fecal incontinence,  heme positive stool, hemorrhoids, irritable bowel syndrome, jaundice, light color stool, liver problems, rectal bleeding, and  rectal pain.    Current Medications (verified): 1)  Ambien 10 Mg Tabs (Zolpidem Tartrate) .Marland Kitchen.. 1 1/2 By Mouth At Bedtime 2)  Celebrex 200 Mg Caps (Celecoxib) .... Take 1 Capsule By Mouth Once A Day 3)  Nasacort Aq 55 Mcg/act Aers (Triamcinolone Acetonide(Nasal)) .... Spray 2 Spray Into Both Nostrils Once A Day 4)  Nexium 40 Mg  Cpdr (Esomeprazole Magnesium) .Marland Kitchen.. 1 By Mouth Once Daily 5)  Hydroxyzine Hcl 25 Mg  Tabs (Hydroxyzine Hcl) .Marland Kitchen.. 1 By Mouth At Bedtime 6)  Vagifem 10 Mcg Tabs (Estradiol) .... Use 3 Times A Week 7)  Flomax 0.4 Mg  Cp24 (Tamsulosin Hcl) .Marland Kitchen.. 1 By Mouth Twice A Week 8)  Biotin Forte 3 Mg  Tabs (Biotin) .... Once Daily 9)  Eq Fiber Therapy 0.52 Gm  Caps (Psyllium) .... Once Daily 10)  Multi-Vitamin   Tabs (Multiple Vitamin) .... Once Daily 11)  Fish Oil   Oil (Fish Oil) .... Once Daily 12)  Calcium 500/d 500-125 Mg-Unit  Tabs (Calcium Carbonate-Vitamin D) .... Once Daily 13)  Crestor 5 Mg  Tabs (Rosuvastatin Calcium) .Marland Kitchen.. 1 By Mouth Once Daily 14)  Zoloft 100 Mg Tabs (Sertraline Hcl) .Marland Kitchen.. 1 By Mouth Once Daily 15)  Hyoscyamine Sulfate Cr 0.375 Mg Xr12h-Tab (Hyoscyamine Sulfate) .Marland Kitchen.. 1 By Mouth Two Times A Day 16)  Sertraline Hcl 100 Mg Tabs (Sertraline Hcl) .Marland Kitchen.. 1 By Mouth Once Daily or As Directed 17)  Fluocinonide 0.05 % Crea (Fluocinonide) .... Apply  Two Times A Day To Poison Ivy Not On Face 18)  Cetirizine Hcl 10 Mg Tabs (Cetirizine Hcl)  Allergies (verified): 1)  ! Codeine  Past History:  Past Medical History: Reviewed history from 03/06/2010 and no changes required. High Cholesterol RLS Breast cancer, hx of left with radiation and lumpectomy Colonic polyps, hx of GERD ercp  and sphincterotomy  for abnormal lfts and dilated biliary ducts 5/05 Pulmonary nodules stable childbirth x 3   Migraine Headache          LAST Mammogram:  1/09 Pap: Hyst Td: 2003 Colonscopy: 2006 EKG: Dexa: 2009  Past Surgical History: Reviewed history from 07/02/2010 and no changes required. Lymph Node Surgery Cystocele Repair CB x3 Colonoscopy Appendectomy Hemorrhoidectomy Lumpectomy Tonsillectomy Hysterectomy C spine surgery  3/03 Gallbladder duct open 2006  Family History: Reviewed history from 07/06/2008 and no changes required. Family History Other cancer - Esophageal Family History of Cardiovascular disorder   No FH of Colon Cancer:  Social History: Reviewed history from 11/29/2009 and no changes required. Retired Single widowed Never Smoked Alcohol use-yes   new International aid/development worker   hhof 1      Review of Systems  The patient denies allergy/sinus, anemia, anxiety-new, arthritis/joint pain, back pain, blood in urine, breast changes/lumps, change in vision, confusion, cough, coughing up blood, depression-new, fainting, fatigue, fever, headaches-new, hearing problems, heart murmur, heart rhythm changes, itching, menstrual pain, muscle pains/cramps, night sweats, nosebleeds, pregnancy symptoms, shortness of breath, skin rash, sleeping problems, sore throat, swelling of feet/legs, swollen lymph glands, thirst - excessive , urination - excessive , urination changes/pain, urine leakage, vision changes, and voice change.         Pertinent positive and negative review of systems were noted in the above HPI. All other ROS was otherwise negative.   Vital Signs:  Patient profile:   77 year old female Menstrual status:  hysterectomy Height:      65 inches Weight:      155 pounds BMI:     25.89 Pulse rate:   68 / minute Pulse rhythm:   regular BP sitting:   112 / 70  (left arm)  Vitals Entered By: Randye Lobo NCMA (July 19, 2010 3:16 PM)  Physical Exam  General:  Well developed, well nourished, no acute distress. Eyes:  PERRLA, no icterus. Mouth:  No deformity or lesions, dentition normal. Neck:  Supple; no masses or  thyromegaly. Lungs:  Clear throughout to auscultation. Heart:  Regular rate and rhythm; no murmurs, rubs,  or bruits. Abdomen:  soft abdomen with normoactive bowel sounds. No scars. No distention. Minimal tenderness in right upper quadrant. Liver edge at costal margin. No CVA tenderness. Extremities:  No clubbing, cyanosis, edema or deformities noted. Skin:  no stigmata of chronic liver disease. Psych:  Alert and cooperative. Normal mood and affect.   Impression & Recommendations:  Problem # 1:  ABDOMINAL PAIN, RIGHT UPPER QUADRANT (ICD-789.01) Patient has recurrent right upper quadrant abdominal pain with a previous negative biliary workup now with cholelithiasis on an abdominal ultrasound. Her liver function tests are normal but they were very elevated in 2006 during one her attacks. She has a dilated common bile duct and underwent a sphincterotomy in 2008. There is a strong family history of this in several relatives. We will make a referral for consideration of cholecystectomy. We have discussed the possibility of a HIDA scan but I feel it is not necessary at this time since the indication for chole is already here even if  her HIDA scan is normal.  Problem # 2:  FECAL OCCULT BLOOD (ICD-792.1) She is up-to-date on her colonoscopy. The next exam will be due in 2016. It may not be necessary due to age.  Other Orders: Normal Surgery ([place])  Patient Instructions: 1)  You have been scheduled for a consult regarding possible cholecystectomy with Dr [name] at [facility] on 08/20/10 @ 2:10 pm. Please arrive at 1:45 pm for registration. 2)  Stay on low-fat diet. 3)  Copy sent to : Dr Shanon Ace, Dr [name] 4)  The medication list was reviewed and reconciled.  All changed / newly prescribed medications were explained.  A complete medication list was provided to the patient / caregiver.

## 2010-10-18 NOTE — Letter (Signed)
Summary: Generic Letter  [facility] [place]   [place], [place] [address]   Phone: [phone]  Fax: [phone]    12/26/2009  [address] [place], [place]  [address]  Dear Ms. Carberry,  Your stool cards are negative for blood.          Sincerely,   Paul Half, CMA (AAMA)

## 2010-10-18 NOTE — Letter (Signed)
Summary: [facility]   Imported By: Laural Benes 02/20/2010 13:53:35  _____________________________________________________________________  External Attachment:    Type:   Image     Comment:   External Document

## 2010-10-18 NOTE — Letter (Signed)
Summary: [facility]   Imported By: Laural Benes 09/20/2010 09:10:58  _____________________________________________________________________  External Attachment:    Type:   Image     Comment:   External Document

## 2010-10-18 NOTE — Progress Notes (Signed)
Summary: Lorrin Mais PA  Phone Note From Other Clinic   Caller: vm Summary of Call: PA call Janett Billow, coverage review rep, [place].  [phone] [id number] Case #.   Request for coverage is currently in process.  Once all info received a decision will be make within 24 hours.  Paperwork faxed to office today.  Paperwork not in Triage office.  Do you have?  Ambien 10 mg needs review because nonpreferred.  No subs listed.  Generic Zolpidem is covered through [employer] & at pharmacy.    Initial call taken by: Shelbie Hutching, RN,  Jan 31, 2010 1:26 PM  Follow-up for Phone Call        Just recieved it and will fax it back today. Follow-up by: Sherron Monday, CMA (AAMA),  Jan 31, 2010 1:56 PM

## 2010-10-18 NOTE — Assessment & Plan Note (Signed)
Summary: 3 MTH ROV  lab only // RS   Vital Signs:  Patient profile:   77 year old female Weight:      156.6 pounds Pulse rate:   66 / minute  Vitals Entered By: Sherron Monday, CMA (AAMA) (October 27, 2009 10:29 AM) CC: Lab only visit- Pt scheduled a annual visit for disease management for a later date.   History of Present Illness: pt  hasnt had lab yet and we reviewed  record and scheduled lab for today and then follow up appt scheduled. Burnis Medin MD  October 27, 2009 1:23 PM   Allergies: 1)  ! Codeine   Complete Medication List: 1)  Ambien 10 Mg Tabs (Zolpidem tartrate) .Marland Kitchen.. 1 1/2 by mouth at bedtime 2)  Celebrex 200 Mg Caps (Celecoxib) .... Take 1 capsule by mouth once a day 3)  Nasacort Aq 55 Mcg/act Aers (Triamcinolone acetonide(nasal)) .... Spray 2 spray into both nostrils once a day 4)  Nexium 40 Mg Cpdr (Esomeprazole magnesium) .Marland Kitchen.. 1 by mouth once daily 5)  Hydroxyzine Hcl 25 Mg Tabs (Hydroxyzine hcl) .Marland Kitchen.. 1 by mouth at bedtime 6)  Vagifem 10 Mcg Tabs (Estradiol) .... Use 3 times a week 7)  Flomax 0.4 Mg Cp24 (Tamsulosin hcl) .Marland Kitchen.. 1 by mouth twice a week 8)  Biotin Forte 3 Mg Tabs (Biotin) .... Once daily 9)  Eq Fiber Therapy 0.52 Gm Caps (Psyllium) .... Once daily 10)  Multi-vitamin Tabs (Multiple vitamin) .... Once daily 11)  Fish Oil Oil (Fish oil) .... Once daily 12)  Calcium 500/d 500-125 Mg-unit Tabs (Calcium carbonate-vitamin d) .... Once daily 13)  Crestor 5 Mg Tabs (Rosuvastatin calcium) .Marland Kitchen.. 1 by mouth once daily 14)  Zoloft 50 Mg Tabs (Sertraline hcl) .Marland Kitchen.. 1 by mouth once daily or as directed 15)  Prednisone 20 Mg Tabs (Prednisone) .... Take 3 by mouth once daily for 3 days ,2  once daily x 3 days,1 once daily x 3 days ,1/2 once daily x 3 days  Other Orders: Venipuncture [id number]) TLB-Hepatic/Liver Function Pnl (80076-HEPATIC) TLB-TSH (Thyroid Stimulating Hormone) (84443-TSH) TLB-CBC Platelet - w/Differential (85025-CBCD) TLB-BMP (Basic  Metabolic Panel-BMET) ([id number]) TLB-Lipid Panel (80061-LIPID)

## 2010-10-18 NOTE — Assessment & Plan Note (Signed)
Summary: neck tingling/ok to wait per Dr. Toula Moos   Vital Signs:  Patient profile:   77 year old female Menstrual status:  hysterectomy Weight:      157 pounds Pulse rate:   72 / minute BP sitting:   130 / 80  (right arm) Cuff size:   regular  Vitals Entered By: Sherron Monday, CMA (AAMA) (March 06, 2010 3:02 PM) CC: Neck tingling that has been going on x 1 month. Pt states that it comes and goes. It is on the left side and when she turns her head to the rt it stops. Sometimes it goes to her left arm and chest area. It doesn't go down her back and her leg.   History of Present Illness: Jeanne Tran comes in today  for acute problem.    Onset  fairly suddenly     in am ike a crcik.  in the neck awaken  with this.   This on for about a month . No rx  except tylenol. Gets tingling left neck  tipping head to right stops the tingling.    Sometimes  goes down  upper left arm and chest.  No chest pain sob. No weakness   balance problems other neuro isses.  No rx for this  better that earlier but still tingling.   Dr Saintclair Halsted  did her neck surgery years ago.   Preventive Screening-Counseling & Management  Alcohol-Tobacco     Alcohol drinks/day: 2     Alcohol type: wine     Smoking Status: quit     Year Quit: 1990  Caffeine-Diet-Exercise     Caffeine use/day: 2     Does Patient Exercise: no  Current Medications (verified): 1)  Ambien 10 Mg Tabs (Zolpidem Tartrate) .Marland Kitchen.. 1 1/2 By Mouth At Bedtime 2)  Celebrex 200 Mg Caps (Celecoxib) .... Take 1 Capsule By Mouth Once A Day 3)  Nasacort Aq 55 Mcg/act Aers (Triamcinolone Acetonide(Nasal)) .... Spray 2 Spray Into Both Nostrils Once A Day 4)  Nexium 40 Mg  Cpdr (Esomeprazole Magnesium) .Marland Kitchen.. 1 By Mouth Once Daily 5)  Hydroxyzine Hcl 25 Mg  Tabs (Hydroxyzine Hcl) .Marland Kitchen.. 1 By Mouth At Bedtime 6)  Vagifem 10 Mcg Tabs (Estradiol) .... Use 3 Times A Week 7)  Flomax 0.4 Mg  Cp24 (Tamsulosin Hcl) .Marland Kitchen.. 1 By Mouth Twice A Week 8)  Biotin Forte 3  Mg  Tabs (Biotin) .... Once Daily 9)  Eq Fiber Therapy 0.52 Gm  Caps (Psyllium) .... Once Daily 10)  Multi-Vitamin   Tabs (Multiple Vitamin) .... Once Daily 11)  Fish Oil   Oil (Fish Oil) .... Once Daily 12)  Calcium 500/d 500-125 Mg-Unit  Tabs (Calcium Carbonate-Vitamin D) .... Once Daily 13)  Crestor 5 Mg  Tabs (Rosuvastatin Calcium) .Marland Kitchen.. 1 By Mouth Once Daily 14)  Zoloft 50 Mg  Tabs (Sertraline Hcl) .Marland Kitchen.. 1 By Mouth Once Daily or As Directed 15)  Hyoscyamine Sulfate Cr 0.375 Mg Xr12h-Tab (Hyoscyamine Sulfate) .Marland Kitchen.. 1 By Mouth Two Times A Day 16)  Sertraline Hcl 100 Mg Tabs (Sertraline Hcl) .Marland Kitchen.. 1 By Mouth Once Daily or As Directed 17)  Fluocinonide 0.05 % Crea (Fluocinonide) .... Apply Two Times A Day To Poison Ivy Not On Face 18)  Cetirizine Hcl 10 Mg Tabs (Cetirizine Hcl)  Allergies (verified): 1)  ! Codeine  Past History:  Past medical, surgical, family and social histories (including risk factors) reviewed, and no changes noted (except as noted below).  Past Medical History: High  Cholesterol RLS Breast cancer, hx of left with radiation and lumpectomy Colonic polyps, hx of GERD ercp  and sphincterotomy  for abnormal lfts and dilated biliary ducts 5/05 Pulmonary nodules stable childbirth x 3   Migraine Headache          LAST Mammogram: 1/09 Pap: Hyst Td: 2003 Colonscopy: 2006 EKG: Dexa: 2009  Past Surgical History: Reviewed history from 08/31/2007 and no changes required. Lymph Node Surgery Cystocele Repair CB x3 Colonoscopy Appendectomy Hemorrhoidectomy Lumpectomy Tonsillectomy Hysterectomy C spine surgery  3/03  Past History:  Care Management: Surgery: [facility] Gastroenterology: Olevia Perches Neurology consult : [facility]  NS in past Dr Saintclair Halsted   Family History: Reviewed history from 07/06/2008 and no changes required. Family History Other cancer - Esophageal Family History of Cardiovascular disorder    Social History: Reviewed history from 11/29/2009 and no  changes required. 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    Physical Exam  General:  alert, well-developed, well-nourished, and well-hydrated.   Head:  normocephalic, atraumatic, and no abnormalities observed.   Eyes:  vision grossly intact.  eoms nl  Ears:  R ear normal and L ear normal.   Mouth:  pharynx pink and moist.  tongue midline Neck:  mild tenderness left trapezius    area no bony tenderness   rom ok  signs with left tipping less iwht right  Lungs:  Normal respiratory effort, chest expands symmetrically. Lungs are clear to auscultation, no crackles or wheezes. Heart:  normal rate, regular rhythm, and no murmur.   Msk:  no joint swelling and no joint warmth.  mild oa changes  Extremities:  no clubbing cyanosis or edema  Neurologic:  alert & oriented X3 and cranial nerves IIi-XII intact.  strength normal in all extremities, gait normal, and DTRs symmetrical and normal.  good balance  Skin:  turgor normal, color normal, no ecchymoses, and no petechiae.   Cervical Nodes:  No lymphadenopathy noted Psych:  Oriented X3, good eye contact, not anxious appearing, and not depressed appearing.     Impression & Recommendations:  Problem # 1:  NECK PAIN (ICD-723.1) seemingly mechanical Her updated medication list for this problem includes:    Celebrex 200 Mg Caps (Celecoxib) .Marland Kitchen... Take 1 capsule by mouth once a day  Orders: T-2 View CXR ([id number]) T-Cervical Spine Comp 4 Views (72050TC)  Problem # 2:  TINGLING (ICD-782.0) Assessment: New coming and going probably  mechanical  nerve  issue ... has djd of neck  and non  focall exam today  but does have hx of breast cancer .  Orders: T-2 View CXR ([id number]) T-Cervical Spine Comp 4 Views (72050TC)  Problem # 3:  BREAST CANCER, HX OF (ICD-V10.3)  Orders: T-2 View CXR ([id number]) T-Cervical Spine Comp 4 Views (72050TC)  Complete Medication List: 1)  Ambien 10 Mg Tabs (Zolpidem tartrate) .Marland Kitchen.. 1 1/2 by mouth at bedtime 2)  Celebrex 200 Mg Caps (Celecoxib) .... Take 1 capsule by mouth once a day 3)  Nasacort Aq 55 Mcg/act Aers (Triamcinolone acetonide(nasal)) .... Spray 2 spray into both nostrils once a day 4)  Nexium 40 Mg Cpdr (Esomeprazole magnesium) .Marland Kitchen.. 1 by mouth once daily 5)  Hydroxyzine  Hcl 25 Mg Tabs (Hydroxyzine hcl) .Marland Kitchen.. 1 by mouth at bedtime 6)  Vagifem 10 Mcg Tabs (Estradiol) .... Use 3 times a week 7)  Flomax 0.4 Mg Cp24 (Tamsulosin hcl) .Marland Kitchen.. 1 by mouth twice a week 8)  Biotin Forte 3 Mg Tabs (Biotin) .... Once daily 9)  Eq Fiber Therapy 0.52 Gm Caps (Psyllium) .... Once daily 10)  Multi-vitamin Tabs (Multiple vitamin) .... Once daily 11)  Fish Oil Oil (Fish oil) .... Once daily 12)  Calcium 500/d 500-125 Mg-unit Tabs (Calcium carbonate-vitamin d) .... Once daily 13)  Crestor 5 Mg Tabs (Rosuvastatin calcium) .Marland Kitchen.. 1 by mouth once daily 14)  Zoloft 50 Mg Tabs (Sertraline hcl) .Marland Kitchen.. 1 by mouth once daily or as directed 15)  Hyoscyamine Sulfate Cr 0.375 Mg Xr12h-tab (Hyoscyamine sulfate) .Marland Kitchen.. 1 by mouth two times a day 16)  Sertraline Hcl 100 Mg Tabs (Sertraline hcl) .Marland Kitchen.. 1 by mouth once daily or as directed 17)  Fluocinonide 0.05 % Crea (Fluocinonide) .... Apply two times a day to poison ivy not on face 18)  Cetirizine Hcl 10 Mg Tabs (Cetirizine hcl)  Patient Instructions: 1)  plain x ray  of neck  and chest  2)  will notify of results  3)  if ok recommend     we try a course of prednisone and see results. 4)  You have normal strength  5)  This acts like a pinched nerve

## 2010-10-18 NOTE — Progress Notes (Signed)
Summary: refills and multiple ?  Phone Note Call from Patient Call back at Home Phone [phone]   Caller: Patient Summary of Call: Pt needs refills on hydroxysine 25mg  and  nexium 40mg  sent to [facility]. Pt sent [place] a copy of fluocinonide 0.05% cream. I'm unsure if she needs a refill on this or not and also a note about ambien needing a prior [place]. I have left pt a message to call back. We need to know if pt needs [place] to send all 4 rx's to [facility] and to tell pt that she needs to have [facility] call us about the prior [place] for [place]. Pt also wanted to know if Lorrin Mais could be authrized for longer than 6 months but since this is a controlled drug we are unable to do any longer than 6 months. Initial call taken by: Sherron Monday, CMA Deborra Medina),  Jan 26, 2010 3:57 PM  Follow-up for Phone Call        Spoke to pt and she is going to have [facility] call us for the prior auth.  Pt wants hydroxysine and nexium to [facility] and the cream to [facility].  Rx sent to pharmacy. Follow-up by: Sherron Monday, CMA (AAMA),  Jan 29, 2010 9:08 AM    New/Updated Medications: FLUOCINONIDE 0.05 % CREA (FLUOCINONIDE) apply two times a day to poison ivy not on face Prescriptions: FLUOCINONIDE 0.05 % CREA (FLUOCINONIDE) apply two times a day to poison ivy not on face  #30 x 0   Entered by:   Sherron Monday, CMA (AAMA)   Authorized by:   Burnis Medin MD   Signed by:   Sherron Monday, CMA (AAMA) on 01/29/2010   Method used:   Electronically to        [facility]. [phone]* (retail)       [address]       [place], [place]  [address]       Ph: [phone]       Fax: [phone]   RxID:   [id number] NEXIUM 40 MG  CPDR (ESOMEPRAZOLE MAGNESIUM) 1 by mouth once daily  #90 x 3   Entered by:   Sherron Monday, CMA (AAMA)   Authorized by:   Burnis Medin MD   Signed by:   Sherron Monday, CMA (AAMA) on 01/29/2010   Method used:   Electronically to          [facility] (mail-order)             ,          Ph: [phone]       Fax: [phone]   RxIDNY:[id number] HYDROXYZINE HCL 25 MG  TABS (HYDROXYZINE HCL) 1 by mouth at bedtime  #90 x 3   Entered by:   Sherron Monday, CMA ([place])   Authorized by:   Burnis Medin MD   Signed by:   Sherron Monday, CMA (AAMA) on 01/29/2010   Method used:   Electronically to        [facility] (mail-order)             ,          Ph: [phone]       Fax: [phone]   RxID:   [id number]

## 2010-10-18 NOTE — Progress Notes (Signed)
Summary: refill on celebrex  Phone Note From Pharmacy   Caller: [facility] Reason for Call: Needs renewal Details for Reason: celebrex Initial call taken by: Sherron Monday, [place] Deborra Medina),  August 28, 2010 4:43 PM  Follow-up for Phone Call        Per Dr. Regis Bill- ok for 6 months. Follow-up by: Sherron Monday, [place] Deborra Medina),  August 28, 2010 5:36 PM  Additional Follow-up for Phone Call Additional follow up Details #1::        Rx sent to pharmacy. Additional Follow-up by: Sherron Monday, CMA (AAMA),  August 29, 2010 8:20 AM    Prescriptions: CELEBREX 200 MG CAPS (CELECOXIB) Take 1 capsule by mouth once a day  #90 x 1   Entered by:   Sherron Monday, CMA (AAMA)   Authorized by:   Burnis Medin MD   Signed by:   Sherron Monday, CMA (AAMA) on 08/29/2010   Method used:   Electronically to        [facility]* (retail)             ,          Ph: [phone]       Fax: [phone]   RxID:   [id number]

## 2010-10-18 NOTE — Letter (Signed)
Summary: Colonoscopy Letter  [facility]  [place], [place] [address]   Phone: [phone]  Fax: [phone]      Feb 07, 2010 MRN: [id number]   [facility] [address] [place], [place]  [address]   Dear Ms. Kruzel,   According to your medical record, it is time for you to schedule a Colonoscopy. The American Cancer Society recommends this procedure as a method to detect early colon cancer. Patients with a family history of colon cancer, or a personal history of colon polyps or inflammatory bowel disease are at increased risk.  This letter has beeen generated based on the recommendations made at the time of your procedure. If you feel that in your particular situation this may no longer apply, please contact our office.  Please call our office at [phone] to schedule this appointment or to update your records at your earliest convenience.  Thank you for cooperating with [place] to provide you with the very best care possible.   Sincerely,  Lowella Bandy. Olevia Perches, M.D.  [facility] [phone]

## 2010-10-18 NOTE — Progress Notes (Signed)
Summary: dexa  Phone Note Outgoing Call   Call placed by: Sherron Monday, CMA,  July 21, 2008 2:20 PM Call placed to: Patient Summary of Call: Called pt about getting a dexa done. Pt states that she had this done at [facility] about 6 weeks ago. Initial call taken by: Sherron Monday, [place],  July 21, 2008 2:20 PM  Follow-up for Phone Call        I beleive  it was sent to scan please check for this and tell pt will get this to her  Also  her stool came back positive for blood. Rec we get her back to GI   consult . Follow-up by: Burnis Medin MD,  July 22, 2008 5:20 PM  Additional Follow-up for Phone Call Additional follow up Details #1::        Left message on machine to call back. Additional Follow-up by: Sherron Monday, CMA,  July 22, 2008 5:30 PM    Additional Follow-up for Phone Call Additional follow up Details #2::    Pt aware of this and will call Dr. Nichola Sizer office to schedule a follow up. Follow-up by: Sherron Monday, CMA,  July 25, 2008 1:19 PM

## 2010-10-18 NOTE — Assessment & Plan Note (Signed)
Summary: RUQ pain/ssc   Vital Signs:  Patient profile:   77 year old female Menstrual status:  hysterectomy Weight:      158 pounds Temp:     99.4 degrees F oral Pulse rate:   60 / minute BP sitting:   110 / 80  (right arm) Cuff size:   regular  Vitals Entered By: Sherron Monday, CMA (AAMA) (July 02, 2010 11:10 AM) CC: RUQ pain- Pt states that it's not constant. Pt states that it is like ache. Pt had the same kind of pain when she had to gallbladder duct open. This has been going on for 1 month.   History of Present Illness: Jeanne Tran comes in today  for onset of about a month ago  of ruq discomfort   with no particular pattern   but  sometimes after eating . Pain Comes and goes   but mostly daily.  and last about 1 hour and ocass nausea  no vomiting. . no nocturnal. about 4-5 /10.  No intervention.  or treatments tried No diarrhea bleeding . No new meds . Similar feeling when had biliary dilitation  rx with ercp in 2005. Has appt with Dr Olevia Perches in November   Preventive Screening-Counseling & Management  Alcohol-Tobacco     Alcohol drinks/day: 0     Smoking Status: quit     Year Quit: 1990  Caffeine-Diet-Exercise     Caffeine use/day: 2     Does Patient Exercise: no  Current Medications (verified): 1)  Ambien 10 Mg Tabs (Zolpidem Tartrate) .Marland Kitchen.. 1 1/2 By Mouth At Bedtime 2)  Celebrex 200 Mg Caps (Celecoxib) .... Take 1 Capsule By Mouth Once A Day 3)  Nasacort Aq 55 Mcg/act Aers (Triamcinolone Acetonide(Nasal)) .... Spray 2 Spray Into Both Nostrils Once A Day 4)  Nexium 40 Mg  Cpdr (Esomeprazole Magnesium) .Marland Kitchen.. 1 By Mouth Once Daily 5)  Hydroxyzine Hcl 25 Mg  Tabs (Hydroxyzine Hcl) .Marland Kitchen.. 1 By Mouth At Bedtime 6)  Vagifem 10 Mcg Tabs (Estradiol) .... Use 3 Times A Week 7)  Flomax 0.4 Mg  Cp24 (Tamsulosin Hcl) .Marland Kitchen.. 1 By Mouth Twice A Week 8)  Biotin Forte 3 Mg  Tabs (Biotin) .... Once Daily 9)  Eq Fiber Therapy 0.52 Gm  Caps (Psyllium) .... Once Daily 10)   Multi-Vitamin   Tabs (Multiple Vitamin) .... Once Daily 11)  Fish Oil   Oil (Fish Oil) .... Once Daily 12)  Calcium 500/d 500-125 Mg-Unit  Tabs (Calcium Carbonate-Vitamin D) .... Once Daily 13)  Crestor 5 Mg  Tabs (Rosuvastatin Calcium) .Marland Kitchen.. 1 By Mouth Once Daily 14)  Zoloft 100 Mg Tabs (Sertraline Hcl) .Marland Kitchen.. 1 By Mouth Once Daily 15)  Hyoscyamine Sulfate Cr 0.375 Mg Xr12h-Tab (Hyoscyamine Sulfate) .Marland Kitchen.. 1 By Mouth Two Times A Day 16)  Sertraline Hcl 100 Mg Tabs (Sertraline Hcl) .Marland Kitchen.. 1 By Mouth Once Daily or As Directed 17)  Fluocinonide 0.05 % Crea (Fluocinonide) .... Apply Two Times A Day To Poison Ivy Not On Face 18)  Cetirizine Hcl 10 Mg Tabs (Cetirizine Hcl)  Allergies (verified): 1)  ! Codeine  Past History:  Past medical, surgical, family and social histories (including risk factors) reviewed, and no changes noted (except as noted below).  Past Medical History: Reviewed history from 03/06/2010 and no changes required. High Cholesterol RLS Breast cancer, hx of left with radiation and lumpectomy Colonic polyps, hx of GERD ercp  and sphincterotomy  for abnormal lfts and dilated biliary ducts 5/05 Pulmonary nodules stable  childbirth x 3   Migraine Headache          LAST Mammogram: 1/09 Pap: Hyst Td: 2003 Colonscopy: 2006 EKG: Dexa: 2009  Past Surgical History: Lymph Node Surgery Cystocele Repair CB x3 Colonoscopy Appendectomy Hemorrhoidectomy Lumpectomy Tonsillectomy Hysterectomy C spine surgery  3/03 Gallbladder duct open 2006  Past History:  Care Management: Surgery: [facility]: Clement Sayres opened up the gallbladder duct- 2006 Neurology consult : HAs  NS in past Dr Saintclair Halsted   Family History: Reviewed history from 07/06/2008 and no changes required. Family History Other cancer - Esophageal Family History of Cardiovascular disorder    Social History: Reviewed history from 11/29/2009 and no changes required. Retired Single widowed Never  Smoked Alcohol use-yes   new International aid/development worker   hhof 1      Review of Systems  The patient denies anorexia, fever, weight loss, weight gain, decreased hearing, hemoptysis, melena, hematochezia, severe indigestion/heartburn, hematuria, muscle weakness, transient blindness, difficulty walking, depression, abnormal bleeding, and enlarged lymph nodes.    Physical Exam  General:  Well-developed,well-nourished,in no acute distress; alert,appropriate and cooperative throughout examination. non toxic and looks generally well Head:  normocephalic and atraumatic.   Eyes:  vision grossly intact.   Nose:  no external deformity and no external erythema.   Neck:  No deformities, masses, or tenderness noted. Lungs:  Normal respiratory effort, chest expands symmetrically. Lungs are clear to auscultation, no crackles or wheezes. Heart:  Normal rate and regular rhythm. S1 and S2 normal without gallop, murmur, click, rub or other extra sounds. Abdomen:  soft, normal bowel sounds, no masses, no guarding, no rigidity, no abdominal hernia, no hepatomegaly, and no splenomegaly.  mildly  no flank pain no rib painuncomfortable right upper quadrant Pulses:  pulses intact without delay   Extremities:  no clubbing cyanosis or edema  Neurologic:  alert & oriented X3 and gait normal.   Skin:  no ecchymoses and no petechiae.   Cervical Nodes:  No lymphadenopathy noted Psych:  Oriented X3, good eye contact, and not anxious appearing.     Impression & Recommendations:  Problem # 1:  ABDOMINAL PAIN, RIGHT UPPER QUADRANT (ICD-789.01) Assessment New unclear etiology  bur pt relates this to [place] when had biliary obst   and gb removed.    will check ezymes today and [place] and keep appt with dr Olevia Perches .   follow up earlier if worse or progressive. Orders: TLB-Hepatic/Liver Function Pnl (80076-HEPATIC) TLB-Amylase (82150-AMYL) TLB-Lipase (83690-LIPASE) UA Dipstick w/o Micro (automated)  (81003) TLB-CBC Platelet - w/Differential  (85025-CBCD) T-CRP (C-Reactive Protein) [id number]) Radiology Referral (Radiology)  Complete Medication List: 1)  Ambien 10 Mg Tabs (Zolpidem tartrate) .Marland Kitchen.. 1 1/2 by mouth at bedtime 2)  Celebrex 200 Mg Caps (Celecoxib) .... Take 1 capsule by mouth once a day 3)  Nasacort Aq 55 Mcg/act Aers (Triamcinolone acetonide(nasal)) .... Spray 2 spray into both nostrils once a day 4)  Nexium 40 Mg Cpdr (Esomeprazole magnesium) .Marland Kitchen.. 1 by mouth once daily 5)  Hydroxyzine Hcl 25 Mg Tabs (Hydroxyzine hcl) .Marland Kitchen.. 1 by mouth at bedtime 6)  Vagifem 10 Mcg Tabs (Estradiol) .... Use 3 times a week 7)  Flomax 0.4 Mg Cp24 (Tamsulosin hcl) .Marland Kitchen.. 1 by mouth twice a week 8)  Biotin Forte 3 Mg Tabs (Biotin) .... Once daily 9)  Eq Fiber Therapy 0.52 Gm Caps (Psyllium) .... Once daily 10)  Multi-vitamin Tabs (Multiple vitamin) .... Once daily 11)  Fish Oil Oil (Fish oil) .... Once daily 12)  Calcium 500/d  500-125 Mg-unit Tabs (Calcium carbonate-vitamin d) .... Once daily 13)  Crestor 5 Mg Tabs (Rosuvastatin calcium) .Marland Kitchen.. 1 by mouth once daily 14)  Zoloft 100 Mg Tabs (Sertraline hcl) .Marland Kitchen.. 1 by mouth once daily 15)  Hyoscyamine Sulfate Cr 0.375 Mg Xr12h-tab (Hyoscyamine sulfate) .Marland Kitchen.. 1 by mouth two times a day 16)  Sertraline Hcl 100 Mg Tabs (Sertraline hcl) .Marland Kitchen.. 1 by mouth once daily or as directed 17)  Fluocinonide 0.05 % Crea (Fluocinonide) .... Apply two times a day to poison ivy not on face 18)  Cetirizine Hcl 10 Mg Tabs (Cetirizine hcl)  Patient Instructions: 1)  You will be informed of lab results when available.  2)  will contact  you about  ultrasound . 3)  Plan follow up with Dr Olevia Perches .  call if worse  in the meantime .    Orders Added: 1)  TLB-Hepatic/Liver Function Pnl [80076-HEPATIC] 2)  TLB-Amylase [82150-AMYL] 3)  TLB-Lipase [83690-LIPASE] 4)  UA Dipstick w/o Micro (automated)  [81003] 5)  TLB-CBC Platelet - w/Differential [85025-CBCD] 6)  T-CRP (C-Reactive Protein) [23860] 7)  Radiology Referral  [Radiology] 8)  Est. Patient Level IV [id number]    Laboratory Results   Urine Tests    Routine Urinalysis   Color: yellow Appearance: Clear Glucose: negative   (Normal Range: Negative) Bilirubin: negative   (Normal Range: Negative) Ketone: negative   (Normal Range: Negative) Spec. Gravity: 1.015   (Normal Range: 1.003-1.035) Blood: negative   (Normal Range: Negative) pH: 7.0   (Normal Range: 5.0-8.0) Protein: negative   (Normal Range: Negative) Urobilinogen: 0.2   (Normal Range: 0-1) Nitrite: negative   (Normal Range: Negative) Leukocyte Esterace: negative   (Normal Range: Negative)    Comments: Joyce Gross  July 02, 2010 3:40 PM

## 2010-10-25 ENCOUNTER — Encounter: Payer: Self-pay | Admitting: Internal Medicine

## 2010-11-06 ENCOUNTER — Other Ambulatory Visit: Payer: Self-pay | Admitting: *Deleted

## 2010-11-06 MED ORDER — ROSUVASTATIN CALCIUM 5 MG PO TABS: 5.0000 mg | ORAL_TABLET | Freq: Every day | ORAL | Status: DC

## 2010-11-13 NOTE — Letter (Signed)
Summary: [facility]   Imported By: Phillis Knack 11/08/2010 09:25:15  _____________________________________________________________________  External Attachment:    Type:   Image     Comment:   External Document

## 2011-01-26 ENCOUNTER — Other Ambulatory Visit: Payer: Self-pay | Admitting: Internal Medicine

## 2011-01-29 NOTE — Assessment & Plan Note (Signed)
[facility]                                 ON-CALL NOTE   NAME:SMIGELHieu, Krish                     MRN:          [id number]  DATE:07/19/2007                            DOB:          May 28, 1934    TIME:  12:39 p.m.   PHONE NUMBER:  [phone].   OBJECTIVE:  Patient has a question about a CT scan that she is scheduled  for tomorrow at 11:30.  Told she needed to consume two bottles of  contrast before her procedure.  She has not been given the contrast and  wonders what she should be doing.   Objective is CT scan with contrast tomorrow.   PLAN:  She will need to call the office first thing in the morning at 8  o'clock because I have no way of getting the contrast for her or letting  her know when to take it.   PRIMARY CARE [place]:  Dr. Regis Bill.   HOME OFFICE:  [place].     Modesto Charon, MD  Electronically Signed    RNS/MedQ  DD: 07/19/2007  DT: 07/20/2007  Job #: [id number]

## 2011-02-01 NOTE — H&P (Signed)
[facility]  Patient:    Jeanne Tran, Jeanne Tran                     MRN: [id number] Adm. Date:  08/20/00 Attending:  Selinda Orion, M.D.                         History and Physical  CHIEF COMPLAINT:  Pelvic pressure and pelvic relaxation.  HISTORY OF PRESENT ILLNESS:  Ms. Limbaugh is a 77 year old gravida 3, para 3, with three normal vaginal births, who presents for vaginal hysterectomy and repair for pelvic relaxation and oophorectomy.  Significant in the past, she had a cystocele repair in 1963.  She has no stress incontinence but does have pelvic pressure and protrusion of the cystocele and rectocele and the cervix out the vagina on standing.  She has been using some Vagifem tablets for vaginal health.  PAST MEDICAL HISTORY:  Significant in her past history is that in 1995, she had an invasive breast cancer with negative lymph nodes and underwent radiation treatment along with axillary node biopsy.  She had no chemotherapy and she has had no evidence of recurrence.  She is being followed by Dr. Vernell Morgans P. Livesay and Dr. Haywood Lasso.  She has had a screening colonoscopy in the past as well.  CURRENT MEDICATIONS:  Klonopin, Celebrex and Ambien for sleep.  ALLERGIES:  She has allergies to CODEINE.  PAST SURGICAL PROCEDURES:  She had a cystocele repair, as noted.  She had a D&C in 1983.  She had a hysteroscopy last year for spotting.  She has not had any bleeding since then.  She had a hemorrhoidectomy in 1963, a tubal ligation in 1978 and breast surgery.  TRANSFUSIONS:  In 1964, she was said to have had a blood transfusion.  REVIEW OF SYSTEMS:  HEENT:  She wears contacts but has noted no recent decrease in visual or auditory acuity.  No dizziness.  No headaches.  HEART: No history of hypertension.  No rheumatic fever.  No mitral valve prolapse and no chest pain.  No shortness of breath.  LUNGS:  No chronic cough.  No history of asthma.  No  history of emphysema.  She denies hemoptysis.  GU:  She specifically denies stress incontinence but does have pelvic pressure.  No history of recurrent UTIs.  GI:  No bowel habit change.  No weight loss or gain.  She regularly does Hemoccults, which have all been negative.  No anorexia and no weight loss.  MUSCLES, BONES AND JOINTS:  No history of fractures or arthritis.  SOCIAL HISTORY:  She drinks alcohol socially.  Does not smoke.  FAMILY HISTORY:  Her mother has hypertension and is 61.  She does not know the whereabouts of her father.  She has one brother and two sisters.  Her maternal grandmother had a gynecologic cancer and her maternal aunt was diabetic.  PHYSICAL EXAMINATION  VITAL SIGNS:  Blood pressure 120/70.  Weight 168 pounds.  GENERAL:  Well-developed, nourished female in no acute distress, oriented in all respects.  HEENT:  The oropharynx is not injected.  The pupils are round and regular and react to light and accommodation.  NECK:  Supple.  Carotid pulses are equal.  Bruits are not heard.  No adenopathy appreciated.  Trachea is in the midline.  BREASTS:  No masses or tenderness.  Radiation changes are noted.  Axillary areas are free from adenopathy.  LUNGS:  Clear.  HEART:  Normal sinus rhythm.  No murmur.  ABDOMEN:  Soft.  Liver, spleen or kidneys are not palpated.  No tenderness. No masses felt.  Bowel sounds are normal and no bruits heard.  EXTREMITIES:  Good range of motion.  NEUROLOGIC:  Cranial nerves are intact.  Deep reflexes bilaterally are equal.  PELVIC:  Examination reveals protrusion of the cervix and a cystocele at the introitus as well as rectocele.  No pelvic masses are noted.  RECTAL:  Hemoccult is negative.  IMPRESSION 1. Pelvic relaxation. 2. History of breast cancer.  PLAN:  Laparoscopically assisted vaginal hysterectomy to remove her ovaries, along with A&P repair.  Risks, benefits and failure rates have been discussed with  [place].  She has been given detailed informed consent. DD:  08/20/00 TD:  08/20/00 Job: [id number]

## 2011-02-01 NOTE — H&P (Signed)
NAME:  Jeanne Tran, Jeanne Tran                      ACCOUNT NO.:  [id number]   MEDICAL RECORD NO.:  [id number]                   PATIENT TYPE:  OBV   LOCATION:  3034                                 FACILITY:  [facility]   PHYSICIAN:  Malcolm T. Fuller Plan, M.D. [facility]          DATE OF BIRTH:  Feb 05, 1934   DATE OF ADMISSION:  01/04/2004  DATE OF DISCHARGE:  01/05/2004                                HISTORY & PHYSICAL   CHIEF COMPLAINT AND REASON FOR ADMISSION:  Patient being admitted for  observation following ERCP with sphincterotomy earlier today.   HISTORY OF PRESENT ILLNESS:  This is a pleasant 77 year old white female.  She has a history of right upper quadrant pain associated with dilated bile  duct in 2004. Initially, her LFTs were normal. Prior ultrasound in 2003  showed no gallstones but in 2004 she did have a common bile duct dilatation  of 14 mm. Ultimately, her LFTs were elevated in association with these  episodes of nausea and right upper quadrant pain.   Dr. Delfin Edis, her primary GI M.D., attempted ERCP in February of 2005;  however, there were two duodenal diverticula in the region where the ampulla  is normally located, and Dr. Olevia Perches was unable to locate the papilla and  thus unable to proceed with the ERCP. She did note an incidental esophageal  stricture, but this was not dilated. The patient had had further episodes of  GI symptoms of nausea, vomiting, and abdominal pain, and on February 8, her  SGOT was 1,074, and her SGPT was 1,069 with an alkaline phosphatase of 280.  Total bilirubin was normal at 1.2 as were the lipase and amylase.   Today, Dr. Fuller Plan performed an ERCP with sphincterotomy and balloon sweep of  the bile duct. This was a challenging procedure as the diverticula made the  ampulla difficult to locate. After a balloon sweep, he did not find any  stones or sludge emerging from the bile duct. He does note that the  sphincterotomy was limited in size by these  diverticula.   In the post ERCP recovery area, the patient is complaining of moderately  severe upper abdominal pain mostly on the right side associated with some  nausea. This has quickly subsided following administration of Demerol and  Phenergan. Plan is to admit the patient for observation and symptomatic  control for at least 23 hours following this sphincterotomy.   ALLERGIES:  CODEINE.   CURRENT MEDICATIONS:  This is from a list provided by the patient:  1. Atarax 25 mg at h.s.  2. Ambien 5 mg one to three tablets at h.s. p.r.n.  3. Zoloft 100 mg daily.  4. Zetia 10 mg daily.  5. Allegra once daily.  6. Nasacort two sprays daily as needed.  7. Aciphex 20 mg daily.  8. Dicyclomine 10 mg twice daily.  9. Calcium 1,200 mg daily.  10.      Multivitamin once daily.  PAST MEDICAL HISTORY:  1. Irritable bowel syndrome.  2. Diverticulosis and colon polyps. Diverticulosis noted and colon polyps     removed at colonoscopy in July of 2003. Do not know the pathology of the     colon polyps however.  3. Degenerative joint disease.   PAST SURGICAL HISTORY:  1. Hemorrhoidectomy in 1961.  2. Cystocele repair in 1963. She had a repeat cystocele with rectocele     repair in 2001.  3. Right knee arthroscopy 1990.  4. Left breast lumpectomy with radiation in 1995 along with axillary lymph     node dissection.  5. Total abdominal hysterectomy in 2001.  6. Cervical diskectomy in 2003.  7. Removal of precancerous lesion from the right thigh approximately two     years ago.   FAMILY HISTORY:  Mother had gallbladder disease. Her maternal grandfather  had esophageal cancer and her maternal grandmother had heart disease. She  has three half siblings, and she is not aware of any significant medical  problems there.   SOCIAL HISTORY:  The patient is a widow, lives in [place]. She  occasionally has wine to drink. She quit tobacco in 1995. She used to work  in the [employer]; she is retired.   REVIEW OF SYSTEMS:  Occasional stress incontinence with laughing or  sneezing. Occasional episodes of nausea with right upper quadrant pain as  described above. No history of any ENT-type or bleeding problems or any  usual bleeding. Denies dysphagia. Denies jaundice. She is complaining of a  dry mouth following the procedure, probably medication related as this is  not a chronic problem. She denies cough or any history of heart disease or  significant activity-limiting shortness of breath.   PHYSICAL EXAMINATION:  VITAL SIGNS:  Blood pressure is 136/62, pulse 70,  respirations 14, room air saturation of 95%. Weight not yet determined.  GENERAL:  The patient is a pleasant older white female who is somewhat  somnolent but now comfortable following ERCP. She is arousable and  appropriate when answering questions. No acute confusion.  HEENT:  No pallor, no icterus, and extraocular movements intact. Oropharynx:  Mucous membranes are dry and clear.  NECK:  There is no JVD. No masses. No bruits.  CHEST:  Clear to auscultation and percussion bilaterally. There is a healed  scar at the left areola. No cough.  COR:  Regular rate and rhythm. No murmurs, rubs, or gallops.  ABDOMEN:  Slightly tender in the left upper quadrant. No guarding, no  rebound. Bowel sounds are active. There is no abdominal distention.  RECTAL/GENITOURINARY EXAMS:  Not performed.  EXTREMITIES:  Dorsalis pedal pulses are 3+ bilaterally. There is no pedal  edema.  NEUROLOGICAL:  No tremor. Her grip strength is 5/5.   LABORATORY DATA:  From the most recent are from April 14 and as follows:  Total bilirubin 0.7, alkaline phosphatase 208, AST 30, and ALT 124.   IMPRESSION:  1. Episodic nausea and vomiting associated with dilated common bile duct and     abnormal LFTs. Question is to whether this is all related to     microlithiasis obstructing the common bile duct now passed versus    sphincter of  Oddi dysfunction. In any event, a small sphincterotomy has     been performed, and the duct itself was clear on balloon sweep today at     ERCP. Has developed some complaint of increased pain with nausea     following the ERCP, so  there is some concern that she may some post ERCP     pancreatitis.  2. History of breast cancer status post lumpectomy and radiation therapy.  3. History of colon polyps. The pathology of the polyps not clear at this     point.  4. Esophageal stricture which is asymptomatic.  5. Duodenal diverticula in the region of the ampulla making the ERCP     challenging as described above.  6. History of irritable bowel syndrome.   PLAN:  The patient is being admitted to 6700 for observation. Will plan to  check labs including a CMET and a lipase level. Will plan to continue IV  fluids. If she is doing well by 6 p.m. tonight, we will allow her to have  clear liquids. In the meantime, sips and ice chips only. Demerol and  Phenergan added for symptom control if needed, and most of her outpatient  medications will be continued.      Azucena Freed, P.A. [facility]                   Malcolm T. Fuller Plan, M.D. [facility]    SG/MEDQ  D:  01/04/2004  T:  01/05/2004  Job:  [id number]   cc:   Standley Brooking. Regis Bill, M.D. [facility]

## 2011-02-01 NOTE — Discharge Summary (Signed)
NAME:  Jeanne Tran, Jeanne Tran                      ACCOUNT NO.:  [id number]   MEDICAL RECORD NO.:  [id number]                   PATIENT TYPE:  OBV   LOCATION:  3034                                 FACILITY:  [facility]   PHYSICIAN:  Malcolm T. Fuller Plan, M.D. [facility]          DATE OF BIRTH:  1934-06-08   DATE OF ADMISSION:  01/04/2004  DATE OF DISCHARGE:  01/05/2004                                 DISCHARGE SUMMARY   ADMISSION DIAGNOSES:  1. History of episodic nausea and vomiting associated with dilated common     bile duct and elevated liver function tests. Rule out secondary to     microlithiasis involving the common bile duct versus sphincter of Oddi     dysfunction.  2. Status post ERCP with sphincterotomy on the day of admission.  3. Rule out post ERCP pancreatitis as patient had some increased abdominal     pain and nausea post ERCP.  4. History of breast cancer status post lumpectomy and radiation therapy in     1995.  5. History of irritable bowel syndrome, diarrhea predominant.  6. Diverticulosis and colon polyps on colonoscopy in July 2003.  7. History of degenerative joint disease.  8. Status post right knee arthroscopy in 1990.  9. Status post cystocele repair in 1963 and repeat cystocele repair with     rectocele repair in 2001.  10.      Status post cervical disk surgery 2003.  11.      Status post hemorrhoidectomy in 1961.  12.      Status post total abdominal hysterectomy in 2001.  13.      Status post removal of precancerous lesion from her right thigh.  14.      Duodenal diverticula.  15.      Asymptomatic esophageal stricture and history of gastroesophageal     reflux disease.   DISCHARGE DIAGNOSES:  1. Status post ERCP with sphincterotomy.  2. Questionable mild post ERCP pancreatitis. Lipase minimally elevated and     symptoms resolved rapidly.  3. History of dilated common bile duct, abnormal LFTs, and episodic bouts of     nausea and vomiting. Question secondary to  microlithiasis involving the     common bile duct versus sphincter of Oddi dysfunction.  4. Status post multiple abdominal and pelvic surgeries, no history of small-     bowel obstruction.  5. Diarrhea-predominant IBS.  6. Asymptomatic esophageal stricture with controlled gastroesophageal reflux     symptoms.  7. Probable chronic obstructive pulmonary disease.   PROCEDURE:  ERCP with sphincterotomy and balloon pull through per Dr.  Lucio Edward, performed January 04, 2004, showing diffuse dilation of the  biliary tree from the common bile duct up into the common hepatic duct. No  irregularity to the ductal appearance. Cannulation of the ampulla was  difficult secondary to presence of periampullar diverticula. A conservative  sphincterotomy was performed and balloon pull through performed twice. No  stones  or sludge noted and minimal resistance to balloon pull through.   CONSULTATIONS:  None.   LABORATORY DATA:  Sodium 139, potassium 3.5, chloride 106, carbon dioxide  28, glucose 101, BUN 9, creatinine 0.8. Total bilirubin 0.8, alkaline  phosphatase 161, AST 66, ALT 68, lipase 181.   IMAGING STUDIES:  None.   HOSPITAL COURSE:  #1.  ERCP with sphincterotomy. This test was performed  prior to her admission. There were no immediate complications other than  patient complained of some upper abdominal pain and nausea and vomiting  which may have just been normal post ERCP symptoms associated with the  cannulation and the procedure itself; however, given that the case was  somewhat challenging, there was some concern that she may have had some post  ERCP pancreatitis. Her lipase was somewhat elevated. She had GI complaints  immediately upon awakening in the recovery area from the ERCP. She was given  Demerol and Phenergan with resolution of symptoms. She required no further  analgesics or antinauseals for the duration of her hospital stay.   At this time of this dictation, the patient had  tolerated clears, and the  plan was to let her have a low fat meal, and if this was tolerated, she  would be discharged home in stable condition.   #2. Irritable bowel syndrome, diarrhea predominant. The patient has been  taking dicyclomine not only her bouts with nausea, vomiting, and abdominal  pain but also because it helps control her diarrhea. Medications were  discussed with the patient, and it was suggested that she might want to try  using Imodium on an as needed basis to see whether this would control her  diarrhea and that she could then revert to dicyclomine use as needed only as  this medication that has many more potential side effects than Imodium does.   #3. History of episodic nausea and vomiting associated with abnormal LFTs  and dilated common bile duct on ultrasound imaging. The patient's history of  dilated common bile duct goes back further the onset of her episodic  symptoms with her episodic GI symptoms. The LFT abnormalities were more  recent within the last eight months. She had had attempt at ERCP in December  by Dr. Olevia Perches, but because of her anatomy, Dr. Olevia Perches was unable to complete  the ERCP. She had a recurrent bout with GI symptoms, and this time, her  transaminases were in the 1000s, so Dr. Olevia Perches referred her to Dr. Fuller Plan for  another attempt at ERCP. Fortunately, this attempt was successfully, and  hopefully we have solved her problems in so far as GI symptoms go. The  patient was advised that should she have further recurrence of her GI  symptoms that she would probably need to have her gallbladder removed,  especially if these symptoms were again associated with abnormal LFTs.   #4.  Headache. The patient complained of a headache in the morning post  procedure day #1. She was given Tylenol for this.   #5.  Chronic obstructive pulmonary disease. The patient's oxygen saturations were about 94% on 2 liters of oxygen. She has seen Dr. Keturah Barre in  the  past but has not needed to see him recently. She says that on x-ray she has  got granuloma. She also smoked up to three packs a day for 35 or so years.  She quit about 5 years ago. It is likely that she has COPD.   CONDITION ON DISCHARGE:  Stable.  FOLLOW UP:  Return office visit May 9 with Dr. Delfin Edis at 3:15.   DIET:  She is to follow a low fat diet for the next three to five days and  then slowly resume regular diet and try more fatty meals to see if these are  tolerated.   MEDICATIONS:  1. Atarax 25 mg at h.s.  2. Ambien 5 mg one to three p.o. at h.s. p.r.n.  3. Zoloft 100 mg daily.  4. Zetia 10 mg daily.  5. Allegra once daily as needed.  6. Nasacort two sprays to nostrils daily as needed.  7. Aciphex 20 mg daily.  8. Dicyclomine 10 mg twice daily as needed.  9. Imodium one to two tablets after loose stools as needed, up to six doses     daily.  10.      Calcium 1,200 mg daily.  11.      Multivitamin once daily.  12.      Phenergan suppository 25 mg to rectum q.4-6h. p.r.n. nausea.  13.      Tylenol regular or extra strength as needed.      Azucena Freed, P.A. [facility]                   Malcolm T. Fuller Plan, M.D. [facility]    SG/MEDQ  D:  01/05/2004  T:  01/07/2004  Job:  [id number]

## 2011-02-01 NOTE — Op Note (Signed)
[place]  Patient:    Jeanne Tran, Jeanne Tran Visit Number: [id number] MRN: [id number]          Service Type: SUR Location: [id number] Attending Physician:  Elaina Hoops Dictated by:   Grayland Jack., M.D. Proc. 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  PROCEDURE:  Anterior cervical diskectomy and fusion C4-5 and C5-6 using 6 mm ______ patellar wedges and a 37.5 mm Atlantis plate, six 13 mm variable angled screws.  SURGEON:  Julien Girt. Guy Begin., MD  ASSISTANT:  Faythe Ghee, M.D.  ANESTHESIA:  General endotracheal.  HISTORY OF PRESENT ILLNESS:  The patient is a very pleasant 77 year old female, who has had longstanding intermittent numbness and tingling with weakness in her arms and legs and clumsiness in her hands.  She has noted progressive worsening in weakness of the right side of her body with the right arm and leg with significant neck pain and popping in the back of her neck. Preoperative imaging showed severe spinal cord compression at C4-5 and C5-6 and it was felt that her clinical examination was consistent with the early signs of myelopathy and that her films supported spinal cord compression.  The patient was extensively recommended anterior cervical diskectomy and fusion at two levels.  The risks and benefits were explained.  The patient decided to proceed forward.  DESCRIPTION OF PROCEDURE:  The patient was brought in the OR and was induced under general anesthesia.  She was positioned supine with a shoulder roll and 5 pounds of halter traction.  The right side of the neck was prepped and draped using sterile fashion.  After a preoperative x-ray localized the C5 vertebral body, a curvilinear incision was made just off the midline to the anterior border of the sternocleidomastoid  with the 10-blade scalpel.  The superficial layer of the platysma was dissected out and divided ______ sternocleidomastoid and the omohyoid and strap muscles were developed down to prevertebral fascia.  The prevertebral fascia was dissected with Kitners.  An intraoperative x-ray confirmed localization at the C5-6 disk space.  This disk was incised with an 11-blade scalpel and pituitary rongeurs used to remove the anterior margins of the annulus to mark it and then the longus colli was reflected laterally at this disk space and the level above.  A self-retaining retractor was placed.  Both large anterior osteophytes were noted coming off the C4-5 and C5-6 vertebral bodies.  Interspaces were bitten off with a Leksell rongeur and then using a 2 and 3 mm Kerrison punch, the remainder of the anterior osteophytes were bitten off.  Then, using high-speed drill, the disk space was drilled down to the posterior osteophyte.  There was a very large posterior osteophyte coming off the C4 vertebral body that was visualized.  The operating microscope was draped and brought into the field and ______ illumination the remainder of the C4-5 interspace was drilled down using 1 and 2 mm Kerrison punch this large osteophyte coming off the C4 vertebral body was removed in a piecemeal fashion and the posterior longitudinal ligament was visualized and also removed in a piecemeal fashion the thecal sac was exposed.  Then, both proximal C5 neural foramina were decompressed out their foramen.  Thecal sac  was visualized.  The thecal sac was noted to be widely decompressed after removal of the large osteophyte coming off C4.  Endplates were then prepared for arthrodesis.  Gelfoam was placed and attention was taken to the C5-6 disk space.  Again, the high-speed drill was used to drill down the remainder of the annulus, herniated nucleus pulposus and the endplates down to the posterior longitudinal ligament as well as  posterior osteophyte.  There was a moderate sized osteophyte coming off the C5 vertebral body ______ ligament was hypertrophied causing spinal cord compression.  This was all removed in a piecemeal fashion with 1 and 2 mm Kerrison punches.  The thecal sac was then visualized and both proximal C6 neural foramina were widely decompressed.  All four neural foramen were explored with a nerve hook and noted to have no further stenosis and the thecal sac essentially was also noted to be widely decompressed after removal of both osteophytes.  Then the endplates again were scraped and prepared to receive the bone graft using interbody spreader, the vertebral bodies were distracted and 6 mm patellar wedge was inserted at the 2 mm cutoff depth. Then, the C4-5 interspace, this was also this procedure was repeated after copious irrigation and removal of Gelfoam.  After two 6 mm patellar wedges were inserted 1 mm deep to the ______ vertebral ______ , a 37.5 mm Atlantis plate was sized, selected, and inserted after being drilled, tapped, and six 13 mm variable angled screws were inserted in the bodies of C4-5 and six. Postoperative x-ray confirmed good localization of the plate, screws, and bone grafts.  The wound was copiously irrigated and meticulous hemostasis was maintained and the platysma was reapproximated with 3-0 interrupted Vicryls, the skin was closed with a running 4-0 subcuticular.  Benzoin and Steri-Strips were applied.  The patient went to recovery room in stable condition.  At the end of the case, all needle counts and sponge counts were correct. Dictated by:   Grayland Jack., M.D. Attending Physician:  Elaina Hoops DD:  11/19/01 TD:  11/19/01 Job: [id number]

## 2011-02-01 NOTE — Assessment & Plan Note (Signed)
[place] OFFICE NOTE   NAME:Bohlen, KEIRSTON COVINGTON                   MRN:          [id number]  DATE:01/12/2007                            DOB:          [birth year]    Ms. Wergin is a very nice, 77 year old, white female with a history of  dilated common bile duct status post sphincterotomy in 2005 by Dr. Fuller Plan  after my unsuccessful attempt for ERCP in February 2005. She had a  remote cholecystectomy. We have also poled her for irritable bowel  syndrome and diverticulosis. She had colon polyps in 2000. Her last  colonoscopy in July 2003. History of breast cancer in 1995 status post  radiation.   The patient had an acute episode of nausea, vomiting and diarrhea which  occurred 4 weeks ago while visiting her sisters at a family reunion in  [place]. She was sick for about 12 hours vomiting greenish-yellow  material. There was no fever and no rectal bleeding. She was able to  drive back 3 days later to [facility] after taking her sister's  Phenergan suppositories which seemed to have helped. She is about 80 or  90% improved now but having small bowel movements rather frequently 5-6  times a day that are formed.   MEDICATIONS:  1. Calcium.  2. Multivitamins.  3. Hydroxyzine 25 mg p.o. daily.  4. Nexium 40 mg p.o. daily.  5. Crestor 1 a day.  6. Celebrex 200 mg p.o. daily.   PHYSICAL EXAMINATION:  VITAL SIGNS:  Blood pressure 122/72, pulse 80 and  weight 167 pounds.  GENERAL:  She was alert, oriented in no distress.  LUNGS:  Clear to auscultation.  COR:  Normal S1, normal S2.  ABDOMEN:  Soft. Liver edge at costal margin. I could not elicit any  tenderness in the right upper quadrant. Her bowel sounds were  hyperactive. There was some tympany diffusely towards the transverse  colon. Lower abdomen was normal.  RECTAL:  Normal rectal tone. Stool was Hemoccult negative.   IMPRESSION:  A 77 year old, white  female with acute episode of nausea,  vomiting and diarrhea suggestive of acute viral gastroenteritis. The  patient feels that her symptoms are more suggestive of recurrent  gallbladder attack but she is much improved now and I can only find  hyperactive bowel sounds on her physical exam. Besides Hemoccult  negative stool, she is having some symptoms of stool frequency as if she  had a post infectious irritable bowel syndrome.   PLAN:  1. Obtain liver function tests, amylase and lipase today.  2. Upper abdominal ultrasound to assess the size of the common bile      duct and to rule out retained stools.  3. Levbid 0.375 mg 1 p.o. b.i.d.  4. Phenergan 25 mg, dispensed 20 to take p.r.n. nausea and vomiting.     Lowella Bandy. Olevia Perches, MD  Electronically Signed    DMB/MedQ  DD: 01/12/2007  DT: 01/12/2007  Job #: [id number]   cc:   Standley Brooking. Regis Bill, MD

## 2011-02-01 NOTE — Discharge Summary (Signed)
[facility]  Patient:    Jeanne Tran, Jeanne Tran [facility]                 MRN: [id number] Adm. Date:  [id number] Disc. Date: [id number] Attending:  Richarda Blade CC:         Haywood Lasso, M.D.  Lennis P. Marko Plume, M.D.   Discharge Summary  ADMISSION DIAGNOSIS:  Status post carcinoma of the breast, complete prolapse.  OPERATION PERFORMED:  Laparoscopic-assisted vaginal hysterectomy, A&P repair, bilateral salpingo-oophorectomy.  BRIEF HISTORY:  [name] is a 77 year old female with pelvic relaxation and pelvic pressure.  She has minimal to no stress urinary incontinence but has noted complete prolapse.  It is significant that in 1995 she had invasive cancer of the breast with negative nodes and underwent radiation treatment. She has had no chemotherapy and no evidence of recurrence.  She is being followed at this time by Dr. Margot Chimes and Dr. Marko Plume.  Of significance on physical examination was the genital prolapse.  LABORATORY STUDIES:  Coagulation profile and metabolic profile was all within normal limits.  Cardiogram was normal.  HOSPITAL COURSE:  The patient was admitted to the hospital and underwent an uneventful laparoscopic-assisted vaginal hysterectomy, A&P repair, bilateral salpingo-oophorectomy, with lysis of adhesions.  Pathology report revealed benign gynecologic organs.  Her postoperative course was uncomplicated.  Foley catheter was removed on the second day, and she was unable to void, so she was discharged with the Foley catheter to return to the office in about three or four days.  DISCHARGE MEDICATIONS:   She will take Cipro for antibiotic coverage along with Percocet and Restoril for pain.  CONDITION UPON DISCHARGE:  Improved. DD:  09/02/00 TD:  09/02/00 Job: [id number]

## 2011-02-01 NOTE — Op Note (Signed)
[facility]  Patient:    Jeanne Tran, Jeanne Tran                     MRN: [id number] Proc. Date: 08/20/00 Attending:  Selinda Orion, M.D.                           Operative Report  PREOPERATIVE DIAGNOSES:  Pelvic relaxation, genital prolapse with cystocele, rectocele, and uterine descensus.  POSTOPERATIVE DIAGNOSES:  Pelvic relaxation, genital prolapse with cystocele, rectocele, and uterine descensus, pelvic adhesions.  OPERATION PERFORMED:  Laparoscopically assisted vaginal hysterectomy, bilateral salpingo-oophorectomy and lysis of adhesions.  DESCRIPTION OF PROCEDURE:  The patient was placed in lithotomy position and prepped and draped in the usual fashion. A Foley catheter was inserted. A Hulka elevator was inserted into the uterus and a transverse incision was made in the abdomen. The abdomen was distended using a Veress needle and aspiration infusion technique. The abdomen was distended with 3.3 liters of carbon dioxide and then a large 12 mm trocar was inserted into the abdomen to visualize the pelvis. Both ovaries and tubes were normal. The tubes and ovaries were stuck to the pelvic side wall bilaterally. Three trocars were inserted, three 5 mm trocars were inserted 1 in the midline and 1 on each side in right mid quadrant and left mid quadrant. This was under direct vision. Visualization of the pelvis was done. The liver was smooth. There was no evidence of intraperitoneal fluid or abnormality on the external surface of the small or large bowel. The adhesions around both ovaries were then lysed using the endoshears and then I separated the infundibulopelvic ligaments on each side with the Seitzinger tripolar forceps 5 mm in size. The round ligaments were handled and the broad ligaments were severed using the Seitzinger forceps. The bladder flap was created and then went down below and removed the uterus by circumscribing the cervix, clamping the  uterosacral and cardinal ligaments and then removing the uterus. The uterosacral ligaments were then plicated in the midline for vault support with #O Ethibond as well as #0 Vicryl. The vagina was closed in the mid plane posteriorly and then we closed the peritoneum with 2-0 PDS and then did an anterior repair by dissecting the pubocervical vaginal fascia from the midline plicating this in the midline with interrupted sutures of 3-0 Vicryl. Excess vagina was then excised and then closed with a locking running suture of Vicryl and interrupted sutures of 3-0 Vicryl. The vagina was closed anteriorly. We then went posteriorly and dissected the rectocele off the vaginal mucosa and plicated the prerectal fascia with interrupted sutures of 3-0 Vicryl. This afforded a good posterior repair. Excess vaginal mucosa was trimmed and then closed with 2-0 chromic and 3-0 chromic as was the bulbocavernosus and deep transverse perinei muscle. The skin was closed with a continuous suture of 3-0 chromic. We packed the vagina and then went above to look at the trocar sites. They were hemostatically secure. The pedicles were hemostatically secure. Irrigation was accomplished without difficulty. The trocars were then removed and the trocar sites were closed. The three 5 mm trocar sites were closed with 3-0 Vicryl and then the 12 mm was closed with one #0 Vicryl following which we used 3-0 Vicryl to close the skin. The four incisions were then infiltrated with about 16 cc of 0.5% Marcaine with epinephrine. The patient tolerated the procedure well and was sent to the recovery room in  good condition. DD:  08/20/00 TD:  08/20/00 Job: [id number]

## 2011-02-08 ENCOUNTER — Other Ambulatory Visit: Payer: Self-pay | Admitting: Internal Medicine

## 2011-02-12 ENCOUNTER — Other Ambulatory Visit: Payer: Self-pay | Admitting: Internal Medicine

## 2011-02-18 ENCOUNTER — Telehealth: Payer: Self-pay | Admitting: Internal Medicine

## 2011-02-18 NOTE — Telephone Encounter (Signed)
Pt received triamcinolone nasal spray only 2 bottles instead for 3 bottles from [facility]. Please correct.

## 2011-02-18 NOTE — Telephone Encounter (Signed)
Pt aware that this was done by accident. She is to call us before she runs out and let us know to do 3 bottles instead of 2. This way we can send a new rx to [facility].

## 2011-02-27 ENCOUNTER — Encounter: Payer: Self-pay | Admitting: Family Medicine

## 2011-02-27 ENCOUNTER — Ambulatory Visit (INDEPENDENT_AMBULATORY_CARE_PROVIDER_SITE_OTHER): Payer: Medicare Other | Admitting: Family Medicine

## 2011-02-27 VITALS — BP 130/70 | Temp 98.6°F | Ht 65.0 in | Wt 158.0 lb

## 2011-02-27 DIAGNOSIS — Z9889 Other specified postprocedural states: Secondary | ICD-10-CM | POA: Insufficient documentation

## 2011-02-27 DIAGNOSIS — R21 Rash and other nonspecific skin eruption: Secondary | ICD-10-CM

## 2011-02-27 MED ORDER — TRIAMCINOLONE ACETONIDE 0.1 % EX CREA: TOPICAL_CREAM | Freq: Two times a day (BID) | CUTANEOUS | Status: AC

## 2011-02-27 NOTE — Patient Instructions (Signed)
Leave off alcohol which can dry your skin.

## 2011-02-27 NOTE — Progress Notes (Signed)
  Subjective:    Patient ID: Jeanne Tran, female    DOB: 1933/12/15, 77 y.o.   MRN: [id number]  HPI  patient seen with intermittent pruritic rash on her back since this past January. Started shortly after gallbladder surgery. No clear precipitants. No pustular features. No change in soaps or detergents. She's tried some topical alcohol and occasionally peroxide. No other topicals. Symptoms are moderate. She does take hydroxyzine at night chronically for sleep   Review of Systems  Constitutional: Negative for fever and chills.  Skin: Positive for rash.       Objective:   Physical Exam  Constitutional: She appears well-developed and well-nourished.  Cardiovascular: Normal rate and regular rhythm.   Pulmonary/Chest: Breath sounds normal. No respiratory distress. She has no wheezes. She has no rales.  Skin:       Patient has nonspecific rash on back. She has a few small erythematous papules which are nonpustular and some these are excoriated. No significant scaling. Has generally mild dry skin          Assessment & Plan:  Nonspecific papular/follicular rash back which is intermittent for several months. Triamcinolone 0.1% cream twice daily as needed for flareups. Avoid alcohol or peroxide or other drying agents.

## 2011-03-14 ENCOUNTER — Telehealth: Payer: Self-pay | Admitting: *Deleted

## 2011-03-14 MED ORDER — ZOLPIDEM TARTRATE 10 MG PO TABS: 10.0000 mg | ORAL_TABLET | Freq: Every evening | ORAL | Status: DC | PRN

## 2011-03-14 MED ORDER — AMBIEN 10 MG PO TABS: 10.0000 mg | ORAL_TABLET | Freq: Every evening | ORAL | Status: DC | PRN

## 2011-03-14 NOTE — Telephone Encounter (Signed)
Pt needs ambien sent to [facility].  Rx mailed to them.

## 2011-03-14 NOTE — Telephone Encounter (Signed)
Rx mailed to pharmacy.

## 2011-03-21 ENCOUNTER — Telehealth: Payer: Self-pay | Admitting: *Deleted

## 2011-04-26 ENCOUNTER — Other Ambulatory Visit: Payer: Self-pay | Admitting: Internal Medicine

## 2011-05-30 ENCOUNTER — Encounter: Payer: Self-pay | Admitting: Internal Medicine

## 2011-06-10 ENCOUNTER — Telehealth: Payer: Self-pay | Admitting: *Deleted

## 2011-06-10 MED ORDER — AMBIEN 10 MG PO TABS: 10.0000 mg | ORAL_TABLET | Freq: Every evening | ORAL | Status: DC | PRN

## 2011-06-10 NOTE — Telephone Encounter (Signed)
Pt states that she needs a written rx for ambien 10mg  sent to [facility] for #90 tabs. She is also wanting to know if she needs to have labs done.

## 2011-06-12 ENCOUNTER — Other Ambulatory Visit: Payer: Self-pay | Admitting: *Deleted

## 2011-06-12 MED ORDER — HYDROXYZINE HCL 25 MG PO TABS: 25.0000 mg | ORAL_TABLET | Freq: Every day | ORAL | Status: DC

## 2011-06-12 MED ORDER — TRIAMCINOLONE ACETONIDE(NASAL) 55 MCG/ACT NA INHA: 2.0000 | Freq: Every day | NASAL | Status: DC

## 2011-06-12 MED ORDER — CELECOXIB 200 MG PO CAPS: 200.0000 mg | ORAL_CAPSULE | Freq: Every day | ORAL | Status: DC

## 2011-06-12 NOTE — Telephone Encounter (Signed)
Refill on triamcinolone nasal, celebrex and hydroxyzine Needs a follow up medication appt.

## 2011-07-13 ENCOUNTER — Other Ambulatory Visit: Payer: Self-pay | Admitting: Internal Medicine

## 2011-07-20 ENCOUNTER — Other Ambulatory Visit: Payer: Self-pay | Admitting: Internal Medicine

## 2011-10-03 ENCOUNTER — Other Ambulatory Visit: Payer: Self-pay | Admitting: Internal Medicine

## 2011-10-13 ENCOUNTER — Other Ambulatory Visit: Payer: Self-pay | Admitting: Internal Medicine

## 2011-10-16 ENCOUNTER — Telehealth: Payer: Self-pay | Admitting: *Deleted

## 2011-10-16 MED ORDER — SERTRALINE HCL 100 MG PO TABS: ORAL_TABLET | ORAL | Status: DC

## 2011-10-16 MED ORDER — ROSUVASTATIN CALCIUM 5 MG PO TABS: 5.0000 mg | ORAL_TABLET | Freq: Every day | ORAL | Status: DC

## 2011-10-16 MED ORDER — TRIAMCINOLONE ACETONIDE(NASAL) 55 MCG/ACT NA INHA: 2.0000 | Freq: Every day | NASAL | Status: DC

## 2011-10-16 MED ORDER — CELECOXIB 200 MG PO CAPS: 200.0000 mg | ORAL_CAPSULE | Freq: Every day | ORAL | Status: DC

## 2011-10-16 MED ORDER — HYDROXYZINE HCL 25 MG PO TABS: 25.0000 mg | ORAL_TABLET | Freq: Every day | ORAL | Status: DC

## 2011-10-16 NOTE — Telephone Encounter (Addendum)
Refills sent to [facility]. Pt needs to schedule a follow up appt.  Pt aware and will call back to make a follow up appt.

## 2011-10-17 ENCOUNTER — Encounter: Payer: Self-pay | Admitting: Internal Medicine

## 2011-10-17 ENCOUNTER — Ambulatory Visit (INDEPENDENT_AMBULATORY_CARE_PROVIDER_SITE_OTHER): Payer: Medicare Other | Admitting: Internal Medicine

## 2011-10-17 VITALS — BP 100/70 | HR 66 | Wt 165.0 lb

## 2011-10-17 DIAGNOSIS — K219 Gastro-esophageal reflux disease without esophagitis: Secondary | ICD-10-CM

## 2011-10-17 DIAGNOSIS — G4700 Insomnia, unspecified: Secondary | ICD-10-CM | POA: Diagnosis not present

## 2011-10-17 DIAGNOSIS — E785 Hyperlipidemia, unspecified: Secondary | ICD-10-CM | POA: Diagnosis not present

## 2011-10-17 DIAGNOSIS — Z853 Personal history of malignant neoplasm of breast: Secondary | ICD-10-CM

## 2011-10-17 DIAGNOSIS — M25559 Pain in unspecified hip: Secondary | ICD-10-CM

## 2011-10-17 DIAGNOSIS — F4322 Adjustment disorder with anxiety: Secondary | ICD-10-CM

## 2011-10-17 DIAGNOSIS — M1990 Unspecified osteoarthritis, unspecified site: Secondary | ICD-10-CM | POA: Diagnosis not present

## 2011-10-17 DIAGNOSIS — Z79899 Other long term (current) drug therapy: Secondary | ICD-10-CM

## 2011-10-17 LAB — CBC WITH DIFFERENTIAL/PLATELET
Basophils Absolute: 0 10*3/uL (ref 0.0–0.1)
Eosinophils Absolute: 0.4 10*3/uL (ref 0.0–0.7)
Hemoglobin: 13.7 g/dL (ref 12.0–15.0)
Lymphocytes Relative: 23.3 % (ref 12.0–46.0)
MCHC: 33.7 g/dL (ref 30.0–36.0)
Monocytes Relative: 10.9 % (ref 3.0–12.0)
Neutrophils Relative %: 58.9 % (ref 43.0–77.0)
RDW: 12.4 % (ref 11.5–14.6)

## 2011-10-17 LAB — HEPATIC FUNCTION PANEL
AST: 24 U/L (ref 0–37)
Alkaline Phosphatase: 88 U/L (ref 39–117)
Bilirubin, Direct: 0 mg/dL (ref 0.0–0.3)
Total Bilirubin: 0.4 mg/dL (ref 0.3–1.2)

## 2011-10-17 LAB — TSH: TSH: 5.12 u[IU]/mL (ref 0.35–5.50)

## 2011-10-17 LAB — BASIC METABOLIC PANEL
CO2: 30 mEq/L (ref 19–32)
Calcium: 9.6 mg/dL (ref 8.4–10.5)
Creatinine, Ser: 1 mg/dL (ref 0.4–1.2)
GFR: 56.64 mL/min — ABNORMAL LOW (ref >60.00)
Glucose, Bld: 91 mg/dL (ref 70–99)
Sodium: 139 mEq/L (ref 135–145)

## 2011-10-17 LAB — LIPID PANEL
HDL: 81.4 mg/dL (ref >39.00)
LDL Cholesterol: 86 mg/dL (ref 0–99)
Total CHOL/HDL Ratio: 2
VLDL: 11.6 mg/dL (ref 0.0–40.0)

## 2011-10-17 MED ORDER — FLUOCINONIDE 0.05 % EX CREA: TOPICAL_CREAM | Freq: Two times a day (BID) | CUTANEOUS | Status: DC

## 2011-10-17 NOTE — Patient Instructions (Signed)
Will notify you  of labs when available. Caution with mental fogginess with sleep medications I think you are doing well. You need lab monitoring  About every 6 months.

## 2011-10-20 DIAGNOSIS — Z79899 Other long term (current) drug therapy: Secondary | ICD-10-CM | POA: Insufficient documentation

## 2011-10-20 NOTE — Progress Notes (Signed)
Subjective:    Patient ID: Jeanne Tran, female    DOB: Oct 23, 1933, 78 y.o.   MRN: [id number]  HPI Patient comes in today for follow up of  multiple medical problems.  IT has been awhile since in for med review and assessment. Since last visit has had her GB out and doing much better and no recurrence of abd pain. LIPIDs; no change in med and nose noted DJD;  hip problematic at times and various sites  Takes celebrex without se at his point  No fallinf or change in status. Skin: needs refeill for lide for dermatitis that flares  None now.  Mood: sertraline quite helpful  And wishes to remain at this time nose  Noted.  Sleep has been on branded ambien for a while and denies  Hangover or unusual se  And HSRD TO SLEEP WITHOUT IT. Also uses hydroxyzine with this  Hx of breast cancer : gets mammo  Dr Ree Edman retired her GYNE.  Review of Systems Neg cp sob change vision hearing  GI GU excet as above . Bleeding dysphagia. Panic attacks new numbness or weakness.   Past history family history social history reviewed in the electronic medical record. Outpatient Encounter Prescriptions as of 10/17/2011  Medication Sig Dispense Refill  . AMBIEN 10 MG tablet Take 1 tablet (10 mg total) by mouth at bedtime as needed.  90 tablet  1  . Biotin 10 MG TABS Take by mouth.        . celecoxib (CELEBREX) 200 MG capsule Take 1 capsule (200 mg total) by mouth daily.  90 capsule  0  . estradiol (VAGIFEM) 25 MCG vaginal tablet Place 25 mcg vaginally daily.        . fish oil-omega-3 fatty acids 1000 MG capsule Take 2 g by mouth daily.        . fluocinonide cream (LIDEX) 0.05 % Apply topically 2 (two) times daily.  30 g  2  . hydrOXYzine (ATARAX/VISTARIL) 25 MG tablet Take 1 tablet (25 mg total) by mouth at bedtime.  90 tablet  0  . MULTIPLE VITAMIN PO Take by mouth.        Marland Kitchen NEXIUM 40 MG capsule TAKE 1 CAPSULE DAILY  90 capsule  2  . psyllium (FIBER THERAPY) 0.52 G capsule Take 0.52 g by mouth daily.        .  rosuvastatin (CRESTOR) 5 MG tablet Take 1 tablet (5 mg total) by mouth daily.  90 tablet  0  . sertraline (ZOLOFT) 100 MG tablet 1 daily.      Marland Kitchen triamcinolone (NASACORT) 55 MCG/ACT nasal inhaler Place 2 sprays into the nose daily.  3 Inhaler  0  . DISCONTD: fluocinonide (LIDEX) 0.05 % cream Apply topically 2 (two) times daily.        Marland Kitchen DISCONTD: sertraline (ZOLOFT) 100 MG tablet 1 daily. Pt needs to schedule a follow up appt before next refill.  90 tablet  0  . triamcinolone (KENALOG) 0.1 % cream Apply topically 2 (two) times daily.  85.2 g  0  . DISCONTD: calcium-vitamin D (OSCAL WITH D) 250-125 MG-UNIT per tablet Take 1 tablet by mouth daily.        Marland Kitchen DISCONTD: hyoscyamine (LEVBID) 0.375 MG 12 hr tablet Take 0.375 mg by mouth every 12 (twelve) hours as needed.             Objective:   Physical Exam WDWN in nad HEENT: Normocephalic ;atraumatic , Eyes;  PERRL, EOMs  Full,  lids and conjunctiva clear,,Ears: no deformities, canals nl, TM landmarks normal, Nose: no deformity or discharge  Mouth : OP clear without lesion or edema . Neck: Supple without adenopathy or masses or bruits Chest:  Clear to A&P without wheezes rales or rhonchi CV:  S1-S2 no gallops or murmurs peripheral perfusion is normal Abdomen:  Sof,t normal bowel sounds without hepatosplenomegaly, no guarding rebound or masses no CVA tenderness No clubbing cyanosis or edema MS no focal atrophy  Some oa changes gait wnl . Skin: normal capillary refill ,turgor , color:  Some dryness.No acute rashes ,petechiae or bruising  Oriented x 3 and no noted deficits in memory, attention, and speech. NEURO grossly non focal.     Assessment & Plan:  Sleep and  Mood  meds stable     Risk benefit of medication discussed. Aware of newer FDA warning on ambien type meds and BEERs list warnings but she is doing wuite well  benefti more than risk at present and will continue same regimen.  DJD on meds long term needs lab monitoring risk discussed    LIPIDS stable  Skin  Dermatitis  Ok to refill .   Hx of breast ca  Remote gets reg checks

## 2011-10-20 NOTE — Assessment & Plan Note (Signed)
stable Risk benefit of medication discussed. Aware of newer FDA warning on ambien type meds and BEERs list warnings but she is doing wuite well  benefti more than risk at present and will continue same regimen.

## 2011-10-22 ENCOUNTER — Encounter: Payer: Self-pay | Admitting: *Deleted

## 2011-10-22 NOTE — Progress Notes (Signed)
Quick Note:    Letter sent to pt.  ______

## 2011-11-19 ENCOUNTER — Other Ambulatory Visit: Payer: Self-pay | Admitting: *Deleted

## 2011-11-19 NOTE — Telephone Encounter (Signed)
Ok to do this  For 6 months worth mof these meds

## 2011-11-19 NOTE — Telephone Encounter (Signed)
Pt needs refills 90 days supply on the following Hydroxyzine 25mg , zoloft 100mg , celebrex 200mg  and vagifem 5mcg sent into [facility].

## 2011-11-20 MED ORDER — CELECOXIB 200 MG PO CAPS: 200.0000 mg | ORAL_CAPSULE | Freq: Every day | ORAL | Status: DC

## 2011-11-20 MED ORDER — HYDROXYZINE HCL 25 MG PO TABS: 25.0000 mg | ORAL_TABLET | Freq: Every day | ORAL | Status: DC

## 2011-11-20 MED ORDER — SERTRALINE HCL 100 MG PO TABS: 100.0000 mg | ORAL_TABLET | Freq: Every day | ORAL | Status: DC

## 2011-11-20 MED ORDER — ESTRADIOL 25 MCG VA TABS: 25.0000 ug | ORAL_TABLET | VAGINAL | Status: DC

## 2011-11-20 NOTE — Telephone Encounter (Signed)
Spoke with pt and she verified that she uses estradiol 3 times weekly.  Rx sent to pharmacy for medications requested.

## 2012-02-11 ENCOUNTER — Other Ambulatory Visit: Payer: Self-pay | Admitting: Internal Medicine

## 2012-03-04 ENCOUNTER — Other Ambulatory Visit: Payer: Self-pay | Admitting: Family Medicine

## 2012-03-04 NOTE — Telephone Encounter (Signed)
Pt last seen on 10/17/11 and has a future appt for 04/24/12.  Please advise.

## 2012-04-24 ENCOUNTER — Encounter: Payer: Self-pay | Admitting: Internal Medicine

## 2012-04-24 ENCOUNTER — Ambulatory Visit (INDEPENDENT_AMBULATORY_CARE_PROVIDER_SITE_OTHER): Payer: Medicare Other | Admitting: Internal Medicine

## 2012-04-24 VITALS — BP 130/90 | HR 68 | Temp 98.8°F | Ht 64.0 in | Wt 158.0 lb

## 2012-04-24 DIAGNOSIS — Z0000 Encounter for general adult medical examination without abnormal findings: Secondary | ICD-10-CM | POA: Diagnosis not present

## 2012-04-24 DIAGNOSIS — F4322 Adjustment disorder with anxiety: Secondary | ICD-10-CM | POA: Diagnosis not present

## 2012-04-24 DIAGNOSIS — K219 Gastro-esophageal reflux disease without esophagitis: Secondary | ICD-10-CM

## 2012-04-24 DIAGNOSIS — Z853 Personal history of malignant neoplasm of breast: Secondary | ICD-10-CM

## 2012-04-24 DIAGNOSIS — G4700 Insomnia, unspecified: Secondary | ICD-10-CM

## 2012-04-24 DIAGNOSIS — Z79899 Other long term (current) drug therapy: Secondary | ICD-10-CM

## 2012-04-24 DIAGNOSIS — E785 Hyperlipidemia, unspecified: Secondary | ICD-10-CM

## 2012-04-24 LAB — LIPID PANEL
LDL Cholesterol: 73 mg/dL (ref 0–99)
Total CHOL/HDL Ratio: 2
VLDL: 16 mg/dL (ref 0.0–40.0)

## 2012-04-24 LAB — CBC WITH DIFFERENTIAL/PLATELET
Basophils Absolute: 0 10*3/uL (ref 0.0–0.1)
Basophils Relative: 0.7 % (ref 0.0–3.0)
HCT: 40.3 % (ref 36.0–46.0)
Hemoglobin: 13.4 g/dL (ref 12.0–15.0)
Lymphocytes Relative: 25.8 % (ref 12.0–46.0)
Lymphs Abs: 1.5 10*3/uL (ref 0.7–4.0)
Monocytes Relative: 9.2 % (ref 3.0–12.0)
Neutro Abs: 3.4 10*3/uL (ref 1.4–7.7)
RBC: 4.25 Mil/uL (ref 3.87–5.11)
RDW: 13.7 % (ref 11.5–14.6)

## 2012-04-24 LAB — BASIC METABOLIC PANEL
CO2: 28 mEq/L (ref 19–32)
GFR: 67.18 mL/min (ref >60.00)
Glucose, Bld: 94 mg/dL (ref 70–99)
Potassium: 4.1 mEq/L (ref 3.5–5.1)
Sodium: 137 mEq/L (ref 135–145)

## 2012-04-24 LAB — HEPATIC FUNCTION PANEL
ALT: 21 U/L (ref 0–35)
AST: 24 U/L (ref 0–37)
Albumin: 4 g/dL (ref 3.5–5.2)
Alkaline Phosphatase: 94 U/L (ref 39–117)
Total Protein: 6.7 g/dL (ref 6.0–8.3)

## 2012-04-24 MED ORDER — AMBIEN 10 MG PO TABS: 10.0000 mg | ORAL_TABLET | Freq: Every evening | ORAL | Status: DC | PRN

## 2012-04-24 NOTE — Progress Notes (Signed)
Subjective:    Patient ID: Jeanne Tran, female    DOB: [birth year], 78 y.o.   MRN: [id number]  HPI Patient comes in today for preventive visit and follow-up of medical issues. Update  history since  last visit: Since last visit she had gall bladder removed Dr Zella Richer about Jan [id number] without complication.  No injury  Sleep still using ambien / atarax for sleep and doing well denies CNS se and falling or memory lapse. Want to continue. Had reeval of c spine because of some neck arm pain and sx but not a surgical problem at this time. GERD taking nexium and skips some days.  LIPIDS no se of meds Moods stable on the current zoloft denies sig depression and anxiety. impairment.    Hearing:  Stable   Vision:  No limitations at present .  Safety:  Has smoke detector and wears seat belts.  No firearms. No excess sun exposure. Sees dentist regularly.  Falls: NONe  Advance directive :  Reviewed   Memory: Felt to be good  , no concern from her or her family.  Depression: No anhedonia unusual crying or depressive symptoms  Nutrition: Eats well balanced diet; adequate calcium and vitamin D. No swallowing chewiing problems.  Injury: no major injuries in the last six months.  Other healthcare providers:  Reviewed today .  Social:  1  1 cat.   Preventive parameters: up-to-date on colonoscopy, mammogram, immunizations. Including Tdap and pneumovax.  ADLS:   There are no problems or need for assistance  driving, feeding, obtaining food, dressing, toileting and bathing, managing money using phone. She is independent.  Exercise walking 2 x per week neg tad;  hx  Tobacco stopped 1996    Review of Systems  ROS:  GEN/ HEENT: No fever, significant weight changes sweats headaches vision problems hearing changes, CV/ PULM; No chest pain shortness of breath cough, syncope,edema  change in exercise tolerance. GI /GU: No adominal pain, vomiting, change in bowel habits. No blood in the  stool. No significant GU symptoms. SKIN/HEME: ,no acute skin rashes suspicious lesions or bleeding. No lymphadenopathy, nodules, masses.  NEURO/ PSYCH:  No neurologic signs such as weakness numbness. No depression anxiety. IMM/ Allergy: No unusual infections.   REST of 12 system review negative except as per HPI     Objective:   Physical Exam BP 130/90  Pulse 68  Temp 98.8 F (37.1 C) (Oral)  Ht 5\' 4"  (1.626 m)  Wt 158 lb (71.668 kg)  BMI 27.12 kg/m2 Physical Exam: Vital signs reviewed [id number] is a well-developed well-nourished alert cooperative  white female who appears her stated age in no acute distress.  HEENT: normocephalic atraumatic , Eyes: PERRL EOM's full, conjunctiva clear, Nares: paten,t no deformity discharge or tenderness., Ears: no deformity EAC's clear TMs with normal landmarks. Mouth: clear OP, no lesions, edema.  Moist mucous membranes. Dentition in adequate repair. NECK: supple without masses, thyromegaly or bruits. CHEST/PULM:  Clear to auscultation and percussion breath sounds equal no wheeze , rales or rhonchi. No chest wall deformities or tenderness. Breast no nodules or dc  Old changes left  CV: PMI is nondisplaced, S1 S2 no gallops, murmurs, rubs. Peripheral pulses are full without delay.No JVD .  ABDOMEN: Bowel sounds normal nontender  No guard or rebound, no hepato splenomegal no CVA tenderness.  No hernia. Extremtities:  No clubbing cyanosis or edema, no acute joint swelling or redness no focal atrophy NEURO:  Oriented x3, cranial nerves 3-12 appear to be  intact, no obvious focal weakness,gait within normal limits no abnormal reflexes or asymmetrical SKIN: No acute rashes normal turgor, color, no bruising or petechiae. PSYCH: Oriented, good eye contact, no obvious depression anxiety, cognition and judgment appear normal. LN: no cervical axillary inguinal adenopathy      Assessment & Plan:   Preventive Health Care Counseled regarding healthy nutrition,  exercise, sleep, injury prevention, calcium vit d and healthy weight .  Sleep high risk meds  Very stable on current regimen  Risk benefit of medication discussed. Continue same. At present  GI nexium forgets sometimes  Can wean to 3 per week. LIPIDS  No se of meds  DJD spine.  celebrex helps  Mood  zoloft   100 works better.  May try to do 50 mg  Hx of breast cancer remote  Gets mammograms regular at this point  Ambien   10 mg .  No sig se.   No amnesia.  With atarax.   Doing a while and no se .

## 2012-04-24 NOTE — Patient Instructions (Addendum)
Continue healthy lifestyle caution to avoid falls exercise as tolerated  Make sure you're taking the equivalent of 1000 units of vitamin D a day. Refill your medicines as needed no change Caution with Atarax and Ambien as they can make you mentally foggy but it appears that you are well adapted to this medicine. Will notify you  of labs when available. If you are doing well preventive visit in 1 year  .  Call for refills.

## 2012-04-27 ENCOUNTER — Other Ambulatory Visit: Payer: Self-pay | Admitting: Family Medicine

## 2012-04-27 ENCOUNTER — Encounter: Payer: Self-pay | Admitting: Internal Medicine

## 2012-04-27 DIAGNOSIS — R7989 Other specified abnormal findings of blood chemistry: Secondary | ICD-10-CM | POA: Insufficient documentation

## 2012-04-27 DIAGNOSIS — E038 Other specified hypothyroidism: Secondary | ICD-10-CM

## 2012-04-27 DIAGNOSIS — Z0000 Encounter for general adult medical examination without abnormal findings: Secondary | ICD-10-CM | POA: Insufficient documentation

## 2012-04-29 ENCOUNTER — Encounter: Payer: Self-pay | Admitting: Internal Medicine

## 2012-05-11 DIAGNOSIS — L259 Unspecified contact dermatitis, unspecified cause: Secondary | ICD-10-CM | POA: Diagnosis not present

## 2012-05-16 DIAGNOSIS — Z23 Encounter for immunization: Secondary | ICD-10-CM | POA: Diagnosis not present

## 2012-05-20 ENCOUNTER — Other Ambulatory Visit: Payer: Self-pay | Admitting: Internal Medicine

## 2012-05-20 DIAGNOSIS — Z1231 Encounter for screening mammogram for malignant neoplasm of breast: Secondary | ICD-10-CM | POA: Diagnosis not present

## 2012-05-21 ENCOUNTER — Encounter: Payer: Self-pay | Admitting: Internal Medicine

## 2012-05-28 ENCOUNTER — Other Ambulatory Visit (INDEPENDENT_AMBULATORY_CARE_PROVIDER_SITE_OTHER): Payer: Medicare Other

## 2012-05-28 ENCOUNTER — Telehealth: Payer: Self-pay | Admitting: Internal Medicine

## 2012-05-28 DIAGNOSIS — E038 Other specified hypothyroidism: Secondary | ICD-10-CM | POA: Diagnosis not present

## 2012-05-28 DIAGNOSIS — R6889 Other general symptoms and signs: Secondary | ICD-10-CM | POA: Diagnosis not present

## 2012-05-28 DIAGNOSIS — R7989 Other specified abnormal findings of blood chemistry: Secondary | ICD-10-CM

## 2012-05-28 LAB — TSH: TSH: 2.47 u[IU]/mL (ref 0.35–5.50)

## 2012-05-28 LAB — T4, FREE: Free T4: 0.82 ng/dL (ref 0.60–1.60)

## 2012-05-28 NOTE — Telephone Encounter (Signed)
Patient would like to know if she needs to get a chest xray.  Please advise patient.

## 2012-05-28 NOTE — Addendum Note (Signed)
Addended by: Joyce Gross R on: 05/28/2012 11:22 AM   Modules accepted: Orders

## 2012-05-29 NOTE — Telephone Encounter (Signed)
Pt just had wellness exam.  Thought she should have had an x-ray for that appt.  Informed her that is not a normal protocol for that exam.  She is not having any problems and did not find any information in WP's note.  Informed pt she does not need x-ray.

## 2012-06-03 ENCOUNTER — Encounter: Payer: Self-pay | Admitting: Internal Medicine

## 2012-06-08 ENCOUNTER — Other Ambulatory Visit: Payer: Self-pay | Admitting: Family Medicine

## 2012-06-08 DIAGNOSIS — R7989 Other specified abnormal findings of blood chemistry: Secondary | ICD-10-CM

## 2012-06-10 ENCOUNTER — Other Ambulatory Visit: Payer: Self-pay | Admitting: Internal Medicine

## 2012-06-12 ENCOUNTER — Telehealth: Payer: Self-pay | Admitting: Internal Medicine

## 2012-06-12 ENCOUNTER — Ambulatory Visit (INDEPENDENT_AMBULATORY_CARE_PROVIDER_SITE_OTHER): Payer: Medicare Other | Admitting: Family Medicine

## 2012-06-12 ENCOUNTER — Other Ambulatory Visit: Payer: Self-pay | Admitting: Family Medicine

## 2012-06-12 ENCOUNTER — Encounter: Payer: Self-pay | Admitting: Family Medicine

## 2012-06-12 VITALS — BP 112/70 | HR 98 | Temp 98.7°F | Wt 158.0 lb

## 2012-06-12 DIAGNOSIS — L259 Unspecified contact dermatitis, unspecified cause: Secondary | ICD-10-CM | POA: Diagnosis not present

## 2012-06-12 MED ORDER — TRIAMCINOLONE ACETONIDE(NASAL) 55 MCG/ACT NA INHA: 2.0000 | Freq: Every day | NASAL | Status: DC

## 2012-06-12 MED ORDER — PREDNISONE 10 MG PO TABS: ORAL_TABLET | ORAL | Status: DC

## 2012-06-12 NOTE — Telephone Encounter (Signed)
I spoke with pt and did clarify directions.

## 2012-06-12 NOTE — Progress Notes (Signed)
  Subjective:    Patient ID: Jeanne Tran, female    DOB: June 15, 1934, 78 y.o.   MRN: [id number]  HPI Here for an itchy rash on the arms, the right neck, and the right face. She is using Fluocinonide cream.    Review of Systems  Constitutional: Negative.   Skin: Positive for rash.       Objective:   Physical Exam  Constitutional: She appears well-developed and well-nourished.  Skin:       Red papulovesicular rash as above           Assessment & Plan:  Given a 16 day taper of Prednisone from 40 mg a day down. Recheck prn

## 2012-06-12 NOTE — Telephone Encounter (Signed)
Pharmacist called and needs clarification on Prednisone. Need to know how many days pt should take med.

## 2012-07-06 ENCOUNTER — Other Ambulatory Visit: Payer: Self-pay | Admitting: Internal Medicine

## 2012-07-07 ENCOUNTER — Other Ambulatory Visit: Payer: Self-pay | Admitting: Family Medicine

## 2012-07-07 MED ORDER — ROSUVASTATIN CALCIUM 5 MG PO TABS: 5.0000 mg | ORAL_TABLET | Freq: Every day | ORAL | Status: DC

## 2012-09-01 ENCOUNTER — Other Ambulatory Visit: Payer: Self-pay | Admitting: Internal Medicine

## 2012-09-03 ENCOUNTER — Other Ambulatory Visit: Payer: Self-pay | Admitting: Family Medicine

## 2012-09-03 MED ORDER — SERTRALINE HCL 100 MG PO TABS: 100.0000 mg | ORAL_TABLET | Freq: Every day | ORAL | Status: DC

## 2012-09-08 ENCOUNTER — Telehealth: Payer: Self-pay | Admitting: Internal Medicine

## 2012-09-08 NOTE — Telephone Encounter (Signed)
Patient asking to schedule OV for a change in bowel habits. States she is having several BM's a day that are formed. This is a change for her and she has taken something to slow bowels in past and would like OV to discuss. Scheduled on 10/02/12 at 2:30 PM.

## 2012-09-28 ENCOUNTER — Other Ambulatory Visit: Payer: Self-pay | Admitting: Internal Medicine

## 2012-09-29 ENCOUNTER — Other Ambulatory Visit: Payer: Self-pay | Admitting: Internal Medicine

## 2012-10-02 ENCOUNTER — Ambulatory Visit (INDEPENDENT_AMBULATORY_CARE_PROVIDER_SITE_OTHER): Payer: Medicare Other | Admitting: Internal Medicine

## 2012-10-02 ENCOUNTER — Encounter: Payer: Self-pay | Admitting: Internal Medicine

## 2012-10-02 VITALS — BP 120/72 | HR 68 | Ht 64.0 in | Wt 160.0 lb

## 2012-10-02 DIAGNOSIS — R198 Other specified symptoms and signs involving the digestive system and abdomen: Secondary | ICD-10-CM

## 2012-10-02 NOTE — Progress Notes (Signed)
Patient left before being seen. Running behind schedule and patient had another appointment to be at.

## 2012-10-02 NOTE — Progress Notes (Signed)
MADELEN Tran [birth year] MRN [id number]        History of Present Illness:  This is a 79 year old, follow up change in bowl habits. Pt decided to leave before being seen. She waited approx 1 hour.   Past Medical History  Diagnosis Date  . Hyperlipidemia   . RLS (restless legs syndrome)   . Breast cancer     hx of left with radiation and lumpectomy  . History of colonic polyps   . GERD (gastroesophageal reflux disease)   . History of ERCP     and sphincterotomy for abnormal lfts and dilated biliary ducts 5/05  . Pulmonary nodule   . Migraine headache    Past Surgical History  Procedure Date  . Ercp     for abnormal lfts and dilated biliary ducts 5/05  . Sphincterotomy     for abnormal lfts and dilated biliary ducts 5/05  . Lymph node biopsy   . Cystocele repair   . Appendectomy   . Hemorrhoid surgery   . Breast lumpectomy   . Tonsillectomy   . Cervical spine surgery 3/03  . Gallbladder duct open 2006  . Cholecystectomy 2012  . Orthoscopic rt knee   . Hysteroscopy   . Complete hysterectomy     reports that she quit smoking about 18 years ago. Her smoking use included Cigarettes. She has a 75 pack-year smoking history. She has never used smokeless tobacco. She reports that she does not drink alcohol or use illicit drugs. family history is not on file. Allergies  Allergen Reactions  . Codeine     REACTION: Intolerance w/ pain meds not cough syrup       Assessment and Plan: Pt left before being seen   10/02/2012 Jeanne Tran

## 2012-10-03 ENCOUNTER — Encounter: Payer: Self-pay | Admitting: Internal Medicine

## 2012-10-21 ENCOUNTER — Ambulatory Visit: Payer: Medicare Other | Admitting: Internal Medicine

## 2012-10-22 ENCOUNTER — Telehealth: Payer: Self-pay | Admitting: Internal Medicine

## 2012-10-22 NOTE — Telephone Encounter (Signed)
Message copied by Oliva Bustard on Thu Oct 22, 2012  2:20 PM ------      Message from: Larina Bras      Created: Thu Oct 22, 2012  8:09 AM                   ----- Message -----         From: Lafayette Dragon, MD         Sent: 10/21/2012  10:15 PM           To: Larina Bras, CMA            No charge.      ----- Message -----         From: Larina Bras, CMA         Sent: 10/21/2012   4:31 PM           To: Lafayette Dragon, MD            Patient no showed appointment (scheduled yesterday) for 10/21/12. Dr Olevia Perches, do you want to charge no show fee?

## 2012-11-03 ENCOUNTER — Telehealth: Payer: Self-pay | Admitting: Family Medicine

## 2012-11-03 NOTE — Telephone Encounter (Signed)
Ok to refill  90 days  X 2 she should get her yearly visit in august.

## 2012-11-03 NOTE — Telephone Encounter (Signed)
The patient is requesting a 90 day supply be sent to the pharmacy.  Last filled on 09/29/12 #30 with 1 refill.  She would like 6 months.  Last seen on 04/24/12 for her annual.  Please advise.  Thanks!!

## 2012-11-04 ENCOUNTER — Other Ambulatory Visit: Payer: Self-pay | Admitting: Family Medicine

## 2012-11-04 MED ORDER — ZOLPIDEM TARTRATE 10 MG PO TABS: ORAL_TABLET | ORAL | Status: DC

## 2012-11-04 NOTE — Telephone Encounter (Signed)
Done/kjh

## 2012-11-04 NOTE — Telephone Encounter (Signed)
Please schedule the pt for her physical in August and let her know that her rx has been sent to the pharmacy.  Thanks!!

## 2012-11-10 ENCOUNTER — Other Ambulatory Visit (INDEPENDENT_AMBULATORY_CARE_PROVIDER_SITE_OTHER): Payer: Medicare Other

## 2012-11-10 DIAGNOSIS — R946 Abnormal results of thyroid function studies: Secondary | ICD-10-CM | POA: Diagnosis not present

## 2012-11-10 DIAGNOSIS — R7989 Other specified abnormal findings of blood chemistry: Secondary | ICD-10-CM

## 2012-11-10 LAB — TSH: TSH: 3.94 u[IU]/mL (ref 0.35–5.50)

## 2012-11-21 ENCOUNTER — Other Ambulatory Visit: Payer: Self-pay | Admitting: Internal Medicine

## 2012-11-23 ENCOUNTER — Other Ambulatory Visit: Payer: Self-pay | Admitting: Family Medicine

## 2012-11-23 MED ORDER — CELECOXIB 200 MG PO CAPS: ORAL_CAPSULE | ORAL | Status: DC

## 2012-11-23 MED ORDER — ESTRADIOL 10 MCG VA TABS: ORAL_TABLET | VAGINAL | Status: DC

## 2012-12-13 ENCOUNTER — Other Ambulatory Visit: Payer: Self-pay | Admitting: Internal Medicine

## 2012-12-25 ENCOUNTER — Other Ambulatory Visit: Payer: Self-pay | Admitting: Internal Medicine

## 2012-12-28 ENCOUNTER — Other Ambulatory Visit: Payer: Self-pay | Admitting: Internal Medicine

## 2012-12-29 ENCOUNTER — Other Ambulatory Visit: Payer: Self-pay | Admitting: Internal Medicine

## 2013-01-05 ENCOUNTER — Telehealth: Payer: Self-pay | Admitting: Family Medicine

## 2013-01-05 ENCOUNTER — Other Ambulatory Visit: Payer: Self-pay | Admitting: Family Medicine

## 2013-01-05 MED ORDER — TRIAMCINOLONE ACETONIDE(NASAL) 55 MCG/ACT NA INHA: NASAL | Status: DC

## 2013-01-05 NOTE — Telephone Encounter (Signed)
Error

## 2013-01-25 ENCOUNTER — Other Ambulatory Visit: Payer: Self-pay | Admitting: Internal Medicine

## 2013-03-09 DIAGNOSIS — H16049 Marginal corneal ulcer, unspecified eye: Secondary | ICD-10-CM | POA: Diagnosis not present

## 2013-03-11 DIAGNOSIS — H16049 Marginal corneal ulcer, unspecified eye: Secondary | ICD-10-CM | POA: Diagnosis not present

## 2013-04-23 ENCOUNTER — Other Ambulatory Visit (INDEPENDENT_AMBULATORY_CARE_PROVIDER_SITE_OTHER): Payer: Medicare Other

## 2013-04-23 ENCOUNTER — Ambulatory Visit (INDEPENDENT_AMBULATORY_CARE_PROVIDER_SITE_OTHER): Payer: Medicare Other | Admitting: Internal Medicine

## 2013-04-23 ENCOUNTER — Encounter: Payer: Self-pay | Admitting: Internal Medicine

## 2013-04-23 VITALS — BP 112/70 | HR 72 | Ht 64.0 in | Wt 161.2 lb

## 2013-04-23 DIAGNOSIS — R197 Diarrhea, unspecified: Secondary | ICD-10-CM | POA: Diagnosis not present

## 2013-04-23 DIAGNOSIS — Z86010 Personal history of colonic polyps: Secondary | ICD-10-CM

## 2013-04-23 LAB — HEPATIC FUNCTION PANEL
ALT: 23 U/L (ref 0–35)
Albumin: 4.1 g/dL (ref 3.5–5.2)
Alkaline Phosphatase: 84 U/L (ref 39–117)
Total Protein: 6.8 g/dL (ref 6.0–8.3)

## 2013-04-23 MED ORDER — DICYCLOMINE HCL 10 MG PO CAPS: 10.0000 mg | ORAL_CAPSULE | Freq: Three times a day (TID) | ORAL | Status: DC

## 2013-04-23 MED ORDER — MOVIPREP 100 G PO SOLR: 1.0000 | Freq: Once | ORAL | Status: DC

## 2013-04-23 NOTE — Progress Notes (Signed)
Jeanne Tran Aug 22, 1934 MRN [id number]   History of Present Illness:  This is a 79 year old white female with frequent bowel movements occurring 5-6 times a day. They are formed but small and urgent. She is leaking  small amount of stool on her underwear. She denies rectal bleeding or abdominal pain. She has no bowel movements at night. Her last colonoscopy in June 2006 showed extensive diverticulosis, tortuous colon, it was a difficult exam.. She is status post remote hemorrhoidectomy .Marland Kitchen A prior colonoscopy in 2003 showed a hyperplastic polyp. She had a laparoscopic cholecystectomy for cholelithiasis in January 2012. She has an enlarged but not obstructed common bile duct. She denies any other upper right upper quadrant abdominal pain. She underwent an ERCP and sphincterotomy and sweep of the CBD by Dr Fuller Plan in April 2005 to rule out common bile duct obstruction. 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 The liver function tests have been normal.   Past Medical History  Diagnosis Date  . Hyperlipidemia   . RLS (restless legs syndrome)   . Breast cancer     hx of left with radiation and lumpectomy  . History of colonic polyps   . GERD (gastroesophageal reflux disease)   . History of ERCP     and sphincterotomy for abnormal lfts and dilated biliary ducts 5/05  . Pulmonary nodule   . Migraine headache    Past Surgical History  Procedure Laterality Date  . Ercp      for abnormal lfts and dilated biliary ducts 5/05  . Sphincterotomy      for abnormal lfts and dilated biliary ducts 5/05  . Lymph node biopsy    . Cystocele repair    . Appendectomy    . Hemorrhoid surgery    . Breast lumpectomy    . Tonsillectomy    . Cervical spine surgery  3/03  . Gallbladder duct open  2006  . Cholecystectomy  2012  . Orthoscopic rt knee    . Hysteroscopy    . Complete hysterectomy      reports that she quit smoking about 19 years ago. Her smoking use included Cigarettes. She has a  75 pack-year smoking history. She has never used smokeless tobacco. She reports that she does not drink alcohol or use illicit drugs. family history is not on file. Allergies  Allergen Reactions  . Codeine     REACTION: Intolerance w/ pain meds not cough syrup        Review of Systems: Denies abdominal pain rectal bleeding weight loss  The remainder of the 10 point ROS is negative except as outlined in H&P   Physical Exam: General appearance  Well developed, in no distress. Eyes- non icteric. HEENT nontraumatic, normocephalic. Mouth no lesions, tongue papillated, no cheilosis. Neck supple without adenopathy, thyroid not enlarged, no carotid bruits, no JVD. Lungs Clear to auscultation bilaterally. Cor normal S1, normal S2, regular rhythm, no murmur,  quiet precordium. Abdomen: Soft nontender with a mildly hyperactive bowel sounds, Liver edge at costal margin. Rectal: Decreased rectal sphincter tone. Small amount of stool in the rectal ampulla. Stool is Hemoccult negative, no external hemorrhoids. Extremities no pedal edema. Skin no lesions. Neurological alert and oriented x 3. Psychological normal mood and affect.  Assessment and Plan:  Problem #49 79 year old white female with frequent bowel movements which are otherwise soft . She has moderate to severe diverticulosis which may be contributing to her symptoms. She also had a prior cholecystectomy which may result in choleretic  diarrhea. We will start her on Bentyl 10 mg before each meal and I have asked her to discontinue taking prunes every day which is contributing to her frequent bowel movements. We will check a sprue profile, liver function tests and sedimentation rate today. She is due for a recall colonoscopy which will be scheduled in the near future.   04/23/2013 Delfin Edis

## 2013-04-23 NOTE — Patient Instructions (Addendum)
You have been scheduled for a colonoscopy with propofol. Please follow written instructions given to you at your visit today.  Please pick up your prep kit at the pharmacy within the next 1-3 days. If you use inhalers (even only as needed), please bring them with you on the day of your procedure. Your physician has requested that you go to www.startemmi.com and enter the access code given to you at your visit today. This web site gives a general overview about your procedure. However, you should still follow specific instructions given to you by our office regarding your preparation for the procedure.  We have sent the following medications to your pharmacy for you to pick up at your convenience: Bentyl  Please stop eating prunes.  Your physician has requested that you go to the basement for the following lab work before leaving today: Sed Rate, Hepatic Panel, Celiac Panel  Cc: Dr Shanon Ace

## 2013-04-24 ENCOUNTER — Encounter: Payer: Self-pay | Admitting: Internal Medicine

## 2013-04-26 ENCOUNTER — Telehealth: Payer: Self-pay | Admitting: Internal Medicine

## 2013-04-26 ENCOUNTER — Encounter: Payer: Medicare Other | Admitting: Internal Medicine

## 2013-04-26 NOTE — Telephone Encounter (Signed)
Pt had cpx on 8/19, but went to dr Olevia Perches and she did lots of blood work and exam and pt feels like everything has been done. Cancelled cpx. Pt will need scripts  and hopes you can get copy of labs from computer. Pt states if you need more labs to pls let her know.

## 2013-04-27 LAB — CELIAC PANEL 10
Endomysial Screen: NEGATIVE
Tissue Transglutaminase Ab, IgA: 3 U/mL (ref <20)

## 2013-04-29 NOTE — Telephone Encounter (Signed)
Needs yearly office visit for medication evaluation.  Uncertain if she needs any labs may not .  Have her schedule  Reg OV.

## 2013-04-30 NOTE — Telephone Encounter (Signed)
Done kh

## 2013-05-01 ENCOUNTER — Other Ambulatory Visit: Payer: Self-pay | Admitting: Internal Medicine

## 2013-05-03 NOTE — Telephone Encounter (Signed)
Last filled on 11/04/12 #90 with 1 additional refill Has a future appt on 05/18/13 Last seen on 04/24/12 Please advise. Thanks!!

## 2013-05-04 ENCOUNTER — Encounter: Payer: Medicare Other | Admitting: Internal Medicine

## 2013-05-04 NOTE — Telephone Encounter (Signed)
Can refill x 1 #90

## 2013-05-05 ENCOUNTER — Other Ambulatory Visit: Payer: Self-pay | Admitting: Family Medicine

## 2013-05-15 ENCOUNTER — Other Ambulatory Visit: Payer: Self-pay | Admitting: Internal Medicine

## 2013-05-18 ENCOUNTER — Encounter: Payer: Self-pay | Admitting: Internal Medicine

## 2013-05-18 ENCOUNTER — Ambulatory Visit (INDEPENDENT_AMBULATORY_CARE_PROVIDER_SITE_OTHER): Payer: Medicare Other | Admitting: Internal Medicine

## 2013-05-18 VITALS — BP 134/80 | HR 77 | Temp 98.3°F | Wt 161.0 lb

## 2013-05-18 DIAGNOSIS — M1990 Unspecified osteoarthritis, unspecified site: Secondary | ICD-10-CM | POA: Diagnosis not present

## 2013-05-18 DIAGNOSIS — R7989 Other specified abnormal findings of blood chemistry: Secondary | ICD-10-CM

## 2013-05-18 DIAGNOSIS — Z23 Encounter for immunization: Secondary | ICD-10-CM | POA: Diagnosis not present

## 2013-05-18 DIAGNOSIS — E785 Hyperlipidemia, unspecified: Secondary | ICD-10-CM

## 2013-05-18 DIAGNOSIS — Z79899 Other long term (current) drug therapy: Secondary | ICD-10-CM

## 2013-05-18 DIAGNOSIS — L258 Unspecified contact dermatitis due to other agents: Secondary | ICD-10-CM

## 2013-05-18 DIAGNOSIS — L853 Xerosis cutis: Secondary | ICD-10-CM | POA: Insufficient documentation

## 2013-05-18 DIAGNOSIS — R6889 Other general symptoms and signs: Secondary | ICD-10-CM

## 2013-05-18 DIAGNOSIS — G4700 Insomnia, unspecified: Secondary | ICD-10-CM

## 2013-05-18 LAB — LIPID PANEL
Cholesterol: 172 mg/dL (ref 0–200)
HDL: 77.9 mg/dL (ref >39.00)
LDL Cholesterol: 75 mg/dL (ref 0–99)
Total CHOL/HDL Ratio: 2
Triglycerides: 97 mg/dL (ref 0.0–149.0)
VLDL: 19.4 mg/dL (ref 0.0–40.0)

## 2013-05-18 LAB — CBC WITH DIFFERENTIAL/PLATELET
Basophils Absolute: 0 10*3/uL (ref 0.0–0.1)
Eosinophils Absolute: 0.4 10*3/uL (ref 0.0–0.7)
HCT: 38.8 % (ref 36.0–46.0)
Lymphocytes Relative: 24 % (ref 12.0–46.0)
Lymphs Abs: 1.7 10*3/uL (ref 0.7–4.0)
MCHC: 33.4 g/dL (ref 30.0–36.0)
Monocytes Relative: 8.3 % (ref 3.0–12.0)
Neutro Abs: 4.3 10*3/uL (ref 1.4–7.7)
Platelets: 180 10*3/uL (ref 150.0–400.0)
RDW: 14.3 % (ref 11.5–14.6)

## 2013-05-18 LAB — BASIC METABOLIC PANEL
BUN: 24 mg/dL — ABNORMAL HIGH (ref 6–23)
CO2: 31 mEq/L (ref 19–32)
Calcium: 9.6 mg/dL (ref 8.4–10.5)
Chloride: 100 mEq/L (ref 96–112)
Creatinine, Ser: 1.3 mg/dL — ABNORMAL HIGH (ref 0.4–1.2)
GFR: 43.3 mL/min — ABNORMAL LOW (ref >60.00)
Glucose, Bld: 90 mg/dL (ref 70–99)
Potassium: 5.1 mEq/L (ref 3.5–5.1)
Sodium: 135 mEq/L (ref 135–145)

## 2013-05-18 MED ORDER — HYDROXYZINE HCL 25 MG PO TABS: ORAL_TABLET | ORAL | Status: DC

## 2013-05-18 MED ORDER — AMBIEN 10 MG PO TABS: ORAL_TABLET | ORAL | Status: DC

## 2013-05-18 MED ORDER — ROSUVASTATIN CALCIUM 5 MG PO TABS: ORAL_TABLET | ORAL | Status: DC

## 2013-05-18 MED ORDER — FLUOCINONIDE 0.05 % EX CREA: TOPICAL_CREAM | Freq: Two times a day (BID) | CUTANEOUS | Status: DC

## 2013-05-18 NOTE — Patient Instructions (Addendum)
Try limiting medications  And do not drive early the next day  Can impair driving reaction. Continue the moisturizer and can try local 1% cortisone  To flaring areas.  Twice a day.   The rx cortisone can help but not on the face.    rov in 6 -12 months or as needed. For med check  consdier tetanus booster at next check

## 2013-05-18 NOTE — Progress Notes (Signed)
Chief Complaint  Patient presents with  . Follow-up    HPI: Patient comes in today for follow up of  multiple medical problems.  Yearly  Med evaluation and visit   Celebrex  Once a day keeps arthritis joints from hurting and worse when stopping.   gerd nexium q d    crestro q d  No se noted  Due for lab s  Sertraline:   100 mg    Had problems weaning .  Anxiety  Worse with lower dosing.   ambien 10 hs :  No se reported   And  Then  Tried 5 mg  And then hydroxyzine  By itself  And didn't work. So bests sleep wioth Ambien brand and hydroxyzine   Rash under chin .  Hx dry skin rash and itching rx lachydrin otc but not enough ? Can she use  hcs . Out of lidex from past  Needs refill   notice with hydroxyzine    So tried to dec but helps and no falling   No etoh  No driving with meds  ROS: See pertinent positives and negatives per HPI. No cp sob  Gi see dr Olevia Perches note  Past Medical History  Diagnosis Date  . Hyperlipidemia   . RLS (restless legs syndrome)   . Breast cancer     hx of left with radiation and lumpectomy  . History of colonic polyps   . GERD (gastroesophageal reflux disease)   . History of ERCP     and sphincterotomy for abnormal lfts and dilated biliary ducts 5/05  . Pulmonary nodule   . Migraine headache     No family history on file.  History   Social History  . Marital Status: Widowed    Spouse Name: N/A    Number of Children: N/A  . Years of Education: N/A   Social History Main Topics  . Smoking status: Former Smoker -- 3.00 packs/day for 25 years    Types: Cigarettes    Quit date: 02/26/1994  . Smokeless tobacco: Never Used  . Alcohol Use: No  . Drug Use: No  . Sexual Activity: None   Other Topics Concern  . None   Social History Narrative   Retired   Single widowed    International aid/development worker   [place] [employer]                Outpatient Encounter Prescriptions as of 05/18/2013  Medication Sig Dispense Refill  . AMBIEN 10 MG tablet take 1 tablet by mouth  at bedtime if needed  90 tablet  1  . BIOTIN 5000 PO Take by mouth daily.      . celecoxib (CELEBREX) 200 MG capsule take 1 capsule by mouth once daily  90 capsule  1  . dicyclomine (BENTYL) 10 MG capsule Take 1 capsule (10 mg total) by mouth 3 (three) times daily before meals.  90 capsule  1  . Estradiol (VAGIFEM) 10 MCG TABS insert 1 tablet vaginally THREE TIMES A WEEK  12 tablet  5  . fish oil-omega-3 fatty acids 1000 MG capsule Take by mouth daily.       . MULTIPLE VITAMIN PO Take by mouth.        Marland Kitchen NEXIUM 40 MG capsule take 1 capsule by mouth once daily  90 capsule  1  . rosuvastatin (CRESTOR) 5 MG tablet take 1 tablet by mouth once daily  90 tablet  3  . sertraline (ZOLOFT) 100 MG tablet  take 1 tablet by mouth once daily  90 tablet  0  . triamcinolone (NASACORT) 55 MCG/ACT nasal inhaler instill 2 sprays into each nostril once daily  148.5 g  0  . [DISCONTINUED] AMBIEN 10 MG tablet take 1 tablet by mouth at bedtime if needed  90 tablet  1  . [DISCONTINUED] CRESTOR 5 MG tablet take 1 tablet by mouth once daily  90 tablet  0  . [DISCONTINUED] VAGIFEM 10 MCG TABS insert 1 tablet vaginally THREE TIMES A WEEK  36 tablet  1  . fluocinonide cream (LIDEX) 0.05 % Apply topically 2 (two) times daily. To itchy rashas needed, not on face  30 g  2  . hydrOXYzine (ATARAX/VISTARIL) 25 MG tablet take 1 tablet by mouth at bedtime every other day  90 tablet  1  . MOVIPREP 100 G SOLR Take 1 kit (200 g total) by mouth once.  1 kit  0  . [DISCONTINUED] hydrOXYzine (ATARAX/VISTARIL) 25 MG tablet take 1 tablet by mouth at bedtime  90 tablet  1   No facility-administered encounter medications on file as of 05/18/2013.    EXAM:  BP 134/80  Pulse 77  Temp(Src) 98.3 F (36.8 C) (Oral)  Wt 161 lb (73.029 kg)  BMI 27.62 kg/m2  SpO2 95%  Body mass index is 27.62 kg/(m^2).  GENERAL: vitals reviewed and listed above, alert, oriented, appears well hydrated and in no acute distress well groomed well spoken   Normal gait   HEENT: atraumatic, conjunctiva  clear, no obvious abnormalities on inspection of external nose and ears OP : no lesion edema or exudate   NECK: no obvious masses on inspection palpation  LUNGS: clear to auscultation bilaterally, no wheezes, rales or rhonchi, good air movement CV: HRRR, no clubbing cyanosis or  peripheral edema nl cap refill  Skin:  Dry patches upper  Anterior neck with erythema  No papules face clear  MS: moves all extremities without noticeable focal  Abnormality djd changes  PSYCH: pleasant and cooperative, no obvious depression or anxiety Lab Results  Component Value Date   WBC 5.8 04/24/2012   HGB 13.4 04/24/2012   HCT 40.3 04/24/2012   PLT 174.0 04/24/2012   GLUCOSE 94 04/24/2012   CHOL 163 04/24/2012   TRIG 80.0 04/24/2012   HDL 73.70 04/24/2012   LDLCALC 73 04/24/2012   ALT 23 04/23/2013   AST 25 04/23/2013   NA 137 04/24/2012   K 4.1 04/24/2012   CL 101 04/24/2012   CREATININE 0.9 04/24/2012   BUN 22 04/24/2012   CO2 28 04/24/2012   TSH 3.94 11/10/2012   INR 0.93 10/03/2010    ASSESSMENT AND PLAN:  Discussed the following assessment and plan:  INSOMNIA, CHRONIC - Plan: Flu Vaccine QUAD 36+ mos PF IM (Fluarix), hydrOXYzine (ATARAX/VISTARIL) 25 MG tablet, AMBIEN 10 MG tablet, Basic metabolic panel, CBC with Differential, Lipid panel  Need for prophylactic vaccination and inoculation against influenza - Plan: Flu Vaccine QUAD 36+ mos PF IM (Fluarix), hydrOXYzine (ATARAX/VISTARIL) 25 MG tablet, AMBIEN 10 MG tablet, Basic metabolic panel, CBC with Differential, Lipid panel  HYPERLIPIDEMIA - Plan: Flu Vaccine QUAD 36+ mos PF IM (Fluarix), hydrOXYzine (ATARAX/VISTARIL) 25 MG tablet, AMBIEN 10 MG tablet, Basic metabolic panel, CBC with Differential, Lipid panel  INSOMNIA, CHRONIC, Chronic - Plan: Flu Vaccine QUAD 36+ mos PF IM (Fluarix), hydrOXYzine (ATARAX/VISTARIL) 25 MG tablet, AMBIEN 10 MG tablet, Basic metabolic panel, CBC with Differential, Lipid panel  DEGENERATIVE  JOINT DISEASE - Plan: Flu Vaccine QUAD  36+ mos PF IM (Fluarix), hydrOXYzine (ATARAX/VISTARIL) 25 MG tablet, AMBIEN 10 MG tablet, Basic metabolic panel, CBC with Differential, Lipid panel  Abnormal TSH - Plan: Flu Vaccine QUAD 36+ mos PF IM (Fluarix), hydrOXYzine (ATARAX/VISTARIL) 25 MG tablet, AMBIEN 10 MG tablet, Basic metabolic panel, CBC with Differential, Lipid panel  High risk medication use To try qod hydroxyzine    Works better to decrease interupted sleep.  Had been given this by Dr Annamaria Boots a while back and helps maintain her sleep . She also has itching but  Not given for this reason.  reveiwed  benefot more than risk at this time  I dont have many other options to offer as the current combo seems to help. But will monitor with time  And continue to use less  Of each. Dosing and numbners.  Skin poss dry skin eczema plus add steroid as needed  To avoid rebound.  -Patient advised to return or notify health care team  if symptoms worsen or persist or new concerns arise.  Patient Instructions  Try limiting medications  And do not drive early the next day  Can impair driving reaction. Continue the moisturizer and can try local 1% cortisone  To flaring areas.  Twice a day.   The rx cortisone can help but not on the face.    rov in 6 -12 months or as needed. For med check  consdier tetanus booster at next check        Standley Brooking. Curby Carswell M.D.

## 2013-05-18 NOTE — Telephone Encounter (Signed)
Patient seen today

## 2013-05-19 NOTE — Telephone Encounter (Signed)
Ok to refill x 6 months 

## 2013-05-21 DIAGNOSIS — Z1231 Encounter for screening mammogram for malignant neoplasm of breast: Secondary | ICD-10-CM | POA: Diagnosis not present

## 2013-05-25 ENCOUNTER — Encounter: Payer: Self-pay | Admitting: Internal Medicine

## 2013-05-25 ENCOUNTER — Ambulatory Visit (AMBULATORY_SURGERY_CENTER): Payer: Medicare Other | Admitting: Internal Medicine

## 2013-05-25 VITALS — BP 123/73 | HR 57 | Temp 98.0°F | Resp 14 | Ht 64.0 in | Wt 161.0 lb

## 2013-05-25 DIAGNOSIS — K219 Gastro-esophageal reflux disease without esophagitis: Secondary | ICD-10-CM | POA: Diagnosis not present

## 2013-05-25 DIAGNOSIS — Z1211 Encounter for screening for malignant neoplasm of colon: Secondary | ICD-10-CM | POA: Diagnosis not present

## 2013-05-25 DIAGNOSIS — R195 Other fecal abnormalities: Secondary | ICD-10-CM

## 2013-05-25 DIAGNOSIS — Z86010 Personal history of colonic polyps: Secondary | ICD-10-CM

## 2013-05-25 DIAGNOSIS — G43909 Migraine, unspecified, not intractable, without status migrainosus: Secondary | ICD-10-CM | POA: Diagnosis not present

## 2013-05-25 DIAGNOSIS — R197 Diarrhea, unspecified: Secondary | ICD-10-CM | POA: Diagnosis not present

## 2013-05-25 DIAGNOSIS — D126 Benign neoplasm of colon, unspecified: Secondary | ICD-10-CM | POA: Diagnosis not present

## 2013-05-25 DIAGNOSIS — K5730 Diverticulosis of large intestine without perforation or abscess without bleeding: Secondary | ICD-10-CM

## 2013-05-25 DIAGNOSIS — Z886 Allergy status to analgesic agent status: Secondary | ICD-10-CM | POA: Diagnosis not present

## 2013-05-25 MED ORDER — SODIUM CHLORIDE 0.9 % IV SOLN: 500.0000 mL | INTRAVENOUS | Status: DC

## 2013-05-25 NOTE — Progress Notes (Signed)
Patient did not have preoperative order for IV antibiotic SSI prophylaxis. (G8918)  Patient did not experience any of the following events: a burn prior to discharge; a fall within the facility; wrong site/side/patient/procedure/implant event; or a hospital transfer or hospital admission upon discharge from the facility. (G8907)  

## 2013-05-25 NOTE — Patient Instructions (Addendum)

## 2013-05-25 NOTE — Op Note (Signed)
[facility]  [address]. [place], [address]   COLONOSCOPY PROCEDURE REPORT  PATIENT: Jeanne Tran, Jeanne Tran  MR#: [id number] BIRTHDATE: 03-02-1934 , 79  yrs. old GENDER: Female ENDOSCOPIST: Lafayette Dragon, MD REFERRED BY:  Burnis Medin, M.D. PROCEDURE DATE:  05/25/2013 PROCEDURE:   Colonoscopy with hot biopsy/bipolar ASA CLASS:   Class II INDICATIONS:recent diarrhea, hx of severe diverticulosis and partial sigmoid obstruction as of prior colonoscopie  2004- hyperplastic polyp, 02/2005- severe divert MEDICATIONS: MAC sedation, administered by CRNA and Propofol (Diprivan) 90 mg IV  DESCRIPTION OF PROCEDURE:   After the risks and benefits and of the procedure were explained, informed consent was obtained.  A digital rectal exam revealed no abnormalities of the rectum.    The LB [id number]  endoscope was introduced through the anus and advanced to the cecum, which was identified by both the appendix and ileocecal valve .  The quality of the prep was good, using MoviPrep .  The instrument was then slowly withdrawn as the colon was fully examined.     COLON FINDINGS: There was severe diverticulosis noted in the sigmoid colon with associated angulation, tortuosity, muscular hypertrophy and colonic narrowing.  There were scattered diverticuli in the right colon, Random biopsies of the colon were taken to r/o microscopic colitis   Retroflexed views revealed no abnormalities. The scope was then withdrawn from the patient and the procedure completed.  COMPLICATIONS: There were no complications. ENDOSCOPIC IMPRESSION: There was severe diverticulosis noted in the sigmoid colon s/p random biopsies of the colon  RECOMMENDATIONS: Await biopsy results Metamucil 1 tsp daily Take Bentyl as needed for colon spasm or diarrhea   REPEAT EXAM: for No recall due to age..  cc:  _______________________________ eSignedLafayette Dragon, MD 05/25/2013 3:27  PM     PATIENT NAME:  Jeanne Tran, Jeanne Tran MR#: [id number]

## 2013-05-25 NOTE — Progress Notes (Signed)
Lidocaine-40mg IV prior to Propofol InductionPropofol given over incremental dosages 

## 2013-05-25 NOTE — Progress Notes (Signed)
Called to room to assist during endoscopic procedure.  Patient ID and intended procedure confirmed with present staff. Received instructions for my participation in the procedure from the performing physician. ewm 

## 2013-05-26 ENCOUNTER — Telehealth: Payer: Self-pay | Admitting: *Deleted

## 2013-05-26 NOTE — Telephone Encounter (Signed)
  Follow up Call-  Call back number 05/25/2013  Post procedure Call Back phone  # [phone]  Permission to leave phone message Yes     Patient questions:  Do you have a fever, pain , or abdominal swelling? no Pain Score  0 *  Have you tolerated food without any problems? yes  Have you been able to return to your normal activities? yes  Do you have any questions about your discharge instructions: Diet   no Medications  no Follow up visit  no  Do you have questions or concerns about your Care? no  Actions: * If pain score is 4 or above: No action needed, pain <4.

## 2013-05-28 ENCOUNTER — Encounter: Payer: Self-pay | Admitting: Internal Medicine

## 2013-05-31 ENCOUNTER — Encounter: Payer: Self-pay | Admitting: Internal Medicine

## 2013-06-01 ENCOUNTER — Other Ambulatory Visit: Payer: Self-pay | Admitting: Family Medicine

## 2013-06-01 DIAGNOSIS — N289 Disorder of kidney and ureter, unspecified: Secondary | ICD-10-CM

## 2013-06-03 ENCOUNTER — Telehealth: Payer: Self-pay | Admitting: Internal Medicine

## 2013-06-03 NOTE — Telephone Encounter (Signed)
Pt returned your call. pls call on cell phone

## 2013-06-03 NOTE — Telephone Encounter (Signed)
Try this!!   [phone]

## 2013-06-03 NOTE — Telephone Encounter (Signed)
Cell phone number in the computer is incorrect.  Do you remember taking another number?

## 2013-06-04 NOTE — Telephone Encounter (Signed)
Patient notified of lab results by telephone.

## 2013-06-09 ENCOUNTER — Other Ambulatory Visit (INDEPENDENT_AMBULATORY_CARE_PROVIDER_SITE_OTHER): Payer: Medicare Other

## 2013-06-09 DIAGNOSIS — N289 Disorder of kidney and ureter, unspecified: Secondary | ICD-10-CM | POA: Diagnosis not present

## 2013-06-09 LAB — BASIC METABOLIC PANEL
BUN: 24 mg/dL — ABNORMAL HIGH (ref 6–23)
CO2: 31 mEq/L (ref 19–32)
Chloride: 105 mEq/L (ref 96–112)
GFR: 50.57 mL/min — ABNORMAL LOW (ref >60.00)

## 2013-06-11 ENCOUNTER — Encounter: Payer: Self-pay | Admitting: Family Medicine

## 2013-06-11 ENCOUNTER — Telehealth: Payer: Self-pay | Admitting: Family Medicine

## 2013-06-11 NOTE — Telephone Encounter (Signed)
Patient would like to know if there is anything she can be prescribed besides Celebrex.  Instructed her to try OTC medication.  She is going to check with the pharmacist to see what would be better to take.  Notified WP will be back in the office on Monday.

## 2013-06-14 NOTE — Telephone Encounter (Signed)
unfortunately all of the antiinflammatory can affect kidneys   Tylenol 500 mg 3- 4 x per day could be tried  In the short run . Sometimes there are topical medications that can be used for given  joint such as knee or hands  such as topical voltaren which safer than pills

## 2013-06-14 NOTE — Telephone Encounter (Signed)
Patient notified of all.  She has decided that she does not want a prescription at this time.  She will "hang in there" and see what happens.  She will call back if pain progresses.

## 2013-06-18 ENCOUNTER — Other Ambulatory Visit: Payer: Self-pay | Admitting: Internal Medicine

## 2013-06-18 DIAGNOSIS — L259 Unspecified contact dermatitis, unspecified cause: Secondary | ICD-10-CM | POA: Diagnosis not present

## 2013-06-18 DIAGNOSIS — L570 Actinic keratosis: Secondary | ICD-10-CM | POA: Diagnosis not present

## 2013-07-01 ENCOUNTER — Other Ambulatory Visit: Payer: Self-pay | Admitting: Neurosurgery

## 2013-07-01 DIAGNOSIS — M5412 Radiculopathy, cervical region: Secondary | ICD-10-CM | POA: Diagnosis not present

## 2013-07-01 DIAGNOSIS — M5010 Cervical disc disorder with radiculopathy, unspecified cervical region: Secondary | ICD-10-CM

## 2013-07-09 ENCOUNTER — Other Ambulatory Visit: Payer: Self-pay | Admitting: Family Medicine

## 2013-07-09 MED ORDER — SERTRALINE HCL 100 MG PO TABS: ORAL_TABLET | ORAL | Status: DC

## 2013-07-12 ENCOUNTER — Ambulatory Visit
Admission: RE | Admit: 2013-07-12 | Discharge: 2013-07-12 | Disposition: A | Discharge status: Home / Self Care | Payer: Medicare Other | Source: Ambulatory Visit | Attending: Neurosurgery | Admitting: Neurosurgery

## 2013-07-12 DIAGNOSIS — M5020 Other cervical disc displacement, unspecified cervical region: Secondary | ICD-10-CM | POA: Diagnosis not present

## 2013-07-12 DIAGNOSIS — M5010 Cervical disc disorder with radiculopathy, unspecified cervical region: Secondary | ICD-10-CM

## 2013-07-12 DIAGNOSIS — M4802 Spinal stenosis, cervical region: Secondary | ICD-10-CM | POA: Diagnosis not present

## 2013-07-12 DIAGNOSIS — M47812 Spondylosis without myelopathy or radiculopathy, cervical region: Secondary | ICD-10-CM | POA: Diagnosis not present

## 2013-07-12 DIAGNOSIS — M4310 Spondylolisthesis, site unspecified: Secondary | ICD-10-CM | POA: Diagnosis not present

## 2013-07-14 ENCOUNTER — Telehealth: Payer: Self-pay | Admitting: Family Medicine

## 2013-07-14 NOTE — Telephone Encounter (Signed)
Patient left a voicemail on my machine.  She wanted to know what she could take since she is no longer on Celebrex.  I called the pt back.  We discussed this on 06/11/13.  See telephone note.  Asked the pt if she had tried the Tylenol 500 mg 3-4 times per day or if she is now ready for a prescription strength medication.  She has not tried Tylenol.  She stated she must have been confused when we spoke the first time.  Instructed her to try Tylenol 500 mg 3-4 times per day for a short period.  Will call back if that does not help and will then discuss prescription medication.

## 2013-07-17 ENCOUNTER — Encounter (HOSPITAL_COMMUNITY): Payer: Self-pay | Admitting: Emergency Medicine

## 2013-07-17 ENCOUNTER — Emergency Department (HOSPITAL_COMMUNITY): Payer: Medicare Other

## 2013-07-17 ENCOUNTER — Emergency Department (HOSPITAL_COMMUNITY)
Admission: EM | Admit: 2013-07-17 | Discharge: 2013-07-18 | Disposition: A | Discharge status: Home / Self Care | Payer: Medicare Other | Attending: Emergency Medicine | Admitting: Emergency Medicine

## 2013-07-17 DIAGNOSIS — K219 Gastro-esophageal reflux disease without esophagitis: Secondary | ICD-10-CM | POA: Diagnosis not present

## 2013-07-17 DIAGNOSIS — Z8679 Personal history of other diseases of the circulatory system: Secondary | ICD-10-CM | POA: Insufficient documentation

## 2013-07-17 DIAGNOSIS — Z8709 Personal history of other diseases of the respiratory system: Secondary | ICD-10-CM | POA: Diagnosis not present

## 2013-07-17 DIAGNOSIS — R112 Nausea with vomiting, unspecified: Secondary | ICD-10-CM | POA: Diagnosis not present

## 2013-07-17 DIAGNOSIS — R918 Other nonspecific abnormal finding of lung field: Secondary | ICD-10-CM | POA: Diagnosis not present

## 2013-07-17 DIAGNOSIS — Z79899 Other long term (current) drug therapy: Secondary | ICD-10-CM | POA: Diagnosis not present

## 2013-07-17 DIAGNOSIS — Z975 Presence of (intrauterine) contraceptive device: Secondary | ICD-10-CM | POA: Insufficient documentation

## 2013-07-17 DIAGNOSIS — Z853 Personal history of malignant neoplasm of breast: Secondary | ICD-10-CM | POA: Insufficient documentation

## 2013-07-17 DIAGNOSIS — N390 Urinary tract infection, site not specified: Secondary | ICD-10-CM | POA: Diagnosis not present

## 2013-07-17 DIAGNOSIS — R5381 Other malaise: Secondary | ICD-10-CM | POA: Diagnosis not present

## 2013-07-17 DIAGNOSIS — Z792 Long term (current) use of antibiotics: Secondary | ICD-10-CM | POA: Diagnosis not present

## 2013-07-17 DIAGNOSIS — R6889 Other general symptoms and signs: Secondary | ICD-10-CM | POA: Diagnosis not present

## 2013-07-17 DIAGNOSIS — Z87891 Personal history of nicotine dependence: Secondary | ICD-10-CM | POA: Diagnosis not present

## 2013-07-17 DIAGNOSIS — R509 Fever, unspecified: Secondary | ICD-10-CM | POA: Diagnosis not present

## 2013-07-17 DIAGNOSIS — E785 Hyperlipidemia, unspecified: Secondary | ICD-10-CM | POA: Diagnosis not present

## 2013-07-17 DIAGNOSIS — I498 Other specified cardiac arrhythmias: Secondary | ICD-10-CM | POA: Diagnosis not present

## 2013-07-17 DIAGNOSIS — Z860100 Personal history of colon polyps, unspecified: Secondary | ICD-10-CM | POA: Insufficient documentation

## 2013-07-17 DIAGNOSIS — R52 Pain, unspecified: Secondary | ICD-10-CM | POA: Diagnosis not present

## 2013-07-17 LAB — CBC WITH DIFFERENTIAL/PLATELET
Basophils Absolute: 0 10*3/uL (ref 0.0–0.1)
Basophils Relative: 0 % (ref 0–1)
Eosinophils Absolute: 0 10*3/uL (ref 0.0–0.7)
Eosinophils Relative: 0 % (ref 0–5)
HCT: 37.3 % (ref 36.0–46.0)
Hemoglobin: 13 g/dL (ref 12.0–15.0)
Lymphocytes Relative: 3 % — ABNORMAL LOW (ref 12–46)
Lymphs Abs: 0.4 10*3/uL — ABNORMAL LOW (ref 0.7–4.0)
MCH: 32 pg (ref 26.0–34.0)
MCHC: 34.9 g/dL (ref 30.0–36.0)
MCV: 91.9 fL (ref 78.0–100.0)
Monocytes Absolute: 1.1 10*3/uL — ABNORMAL HIGH (ref 0.1–1.0)
Monocytes Relative: 9 % (ref 3–12)
Neutro Abs: 11.7 10*3/uL — ABNORMAL HIGH (ref 1.7–7.7)
Neutrophils Relative %: 88 % — ABNORMAL HIGH (ref 43–77)
Platelets: 143 10*3/uL — ABNORMAL LOW (ref 150–400)
RBC: 4.06 MIL/uL (ref 3.87–5.11)
RDW: 13.3 % (ref 11.5–15.5)
WBC: 13.2 10*3/uL — ABNORMAL HIGH (ref 4.0–10.5)

## 2013-07-17 LAB — POCT I-STAT, CHEM 8
Calcium, Ion: 1.08 mmol/L — ABNORMAL LOW (ref 1.13–1.30)
Chloride: 101 mEq/L (ref 96–112)
Creatinine, Ser: 1.3 mg/dL — ABNORMAL HIGH (ref 0.50–1.10)
Glucose, Bld: 129 mg/dL — ABNORMAL HIGH (ref 70–99)
HCT: 39 % (ref 36.0–46.0)
Hemoglobin: 13.3 g/dL (ref 12.0–15.0)
Potassium: 3.6 mEq/L (ref 3.5–5.1)
Sodium: 137 mEq/L (ref 135–145)

## 2013-07-17 LAB — URINALYSIS, ROUTINE W REFLEX MICROSCOPIC
Bilirubin Urine: NEGATIVE
Glucose, UA: NEGATIVE mg/dL
Ketones, ur: 15 mg/dL — AB
Nitrite: POSITIVE — AB
Protein, ur: 100 mg/dL — AB
Specific Gravity, Urine: 1.02 (ref 1.005–1.030)
Urobilinogen, UA: 1 mg/dL (ref 0.0–1.0)
pH: 6 (ref 5.0–8.0)

## 2013-07-17 LAB — URINE MICROSCOPIC-ADD ON

## 2013-07-17 MED ORDER — ONDANSETRON HCL 4 MG PO TABS: 4.0000 mg | ORAL_TABLET | Freq: Four times a day (QID) | ORAL | Status: DC

## 2013-07-17 MED ORDER — SODIUM CHLORIDE 0.9 % IV BOLUS (SEPSIS)
1000.0000 mL | Freq: Once | INTRAVENOUS | Status: AC
  Administered 2013-07-17: 1000 mL via INTRAVENOUS

## 2013-07-17 MED ORDER — SULFAMETHOXAZOLE-TMP DS 800-160 MG PO TABS: 1.0000 | ORAL_TABLET | Freq: Two times a day (BID) | ORAL | Status: DC

## 2013-07-17 MED ORDER — DEXTROSE 5 % IV SOLN
1.0000 g | Freq: Once | INTRAVENOUS | Status: AC
  Administered 2013-07-17: 1 g via INTRAVENOUS
  Filled 2013-07-17: qty 10

## 2013-07-17 MED ORDER — KETOROLAC TROMETHAMINE 30 MG/ML IJ SOLN
15.0000 mg | Freq: Once | INTRAMUSCULAR | Status: AC
  Administered 2013-07-17: 15 mg via INTRAVENOUS
  Filled 2013-07-17: qty 1

## 2013-07-17 MED ORDER — ONDANSETRON HCL 4 MG/2ML IJ SOLN
4.0000 mg | Freq: Once | INTRAMUSCULAR | Status: AC
  Administered 2013-07-17: 4 mg via INTRAVENOUS
  Filled 2013-07-17: qty 2

## 2013-07-17 NOTE — ED Notes (Signed)
Bed: [id number] Expected date:  Expected time:  Means of arrival:  Comments: EMS/flu like symptoms

## 2013-07-17 NOTE — ED Notes (Signed)
Pt requesting po fluids-cleared with Dr. Corinna Lines given gingerale

## 2013-07-17 NOTE — ED Notes (Signed)
Pt complains of flu like sx for past 2 days. Also began to have emesis this am, cannot keep anything down. Pt had fever with EMS

## 2013-07-18 NOTE — ED Provider Notes (Signed)
CSN: [id number]     Arrival date & time 07/17/13  1857 History   First MD Initiated Contact with Patient 07/17/13 2017     Chief Complaint  Patient presents with  . Fever  . Emesis   (Consider location/radiation/quality/duration/timing/severity/associated sxs/prior Treatment) HPI  79yF with general malaise.  Symptom onset 2 days ago. Fever and generalized body aches. Feels fatigued. Nausea and vomiting since yesterday. No diarrhea. No cough or SOB. No urinary complaints. No rash. No sick contacts. Has taken Tylenol with minimal relief. Had flu shot this year.   Past Medical History  Diagnosis Date  . Hyperlipidemia   . RLS (restless legs syndrome)   . Breast cancer     hx of left with radiation and lumpectomy  . History of colonic polyps   . GERD (gastroesophageal reflux disease)   . History of ERCP     and sphincterotomy for abnormal lfts and dilated biliary ducts 5/05  . Pulmonary nodule   . Migraine headache    Past Surgical History  Procedure Laterality Date  . Ercp      for abnormal lfts and dilated biliary ducts 5/05  . Sphincterotomy      for abnormal lfts and dilated biliary ducts 5/05  . Lymph node biopsy    . Cystocele repair    . Appendectomy    . Hemorrhoid surgery    . Breast lumpectomy    . Tonsillectomy    . Cervical spine surgery  3/03  . Gallbladder duct open  2006  . Cholecystectomy  2012  . Orthoscopic rt knee    . Hysteroscopy    . Complete hysterectomy     History reviewed. No pertinent family history. History  Substance Use Topics  . Smoking status: Former Smoker -- 3.00 packs/day for 25 years    Types: Cigarettes    Quit date: 02/26/1994  . Smokeless tobacco: Never Used  . Alcohol Use: No   OB History   Grav Para Term Preterm Abortions TAB SAB Ect Mult Living                 Review of Systems  All systems reviewed and negative, other than as noted in HPI.   Allergies  Codeine  Home Medications   Current Outpatient Rx  Name   Route  Sig  Dispense  Refill  . AMBIEN 10 MG tablet      take 1 tablet by mouth at bedtime if needed   90 tablet   1     Dispense as written.   Marland Kitchen BIOTIN 5000 PO   Oral   Take 5,000 mg by mouth every other day.          . Estradiol (VAGIFEM) 10 MCG TABS      insert 1 tablet vaginally THREE TIMES A WEEK   12 tablet   5   . fish oil-omega-3 fatty acids 1000 MG capsule   Oral   Take 1 g by mouth daily.          . fluocinonide cream (LIDEX) 0.05 %   Topical   Apply topically 2 (two) times daily. To itchy rashas needed, not on face   30 g   2   . hydrOXYzine (ATARAX/VISTARIL) 25 MG tablet      take 1 tablet by mouth at bedtime every other day   90 tablet   1   . Multiple Vitamin (MULTIVITAMIN WITH MINERALS) TABS tablet   Oral   Take 1 tablet  by mouth daily.         Marland Kitchen NEXIUM 40 MG capsule      take 1 capsule by mouth once daily   90 capsule   1   . rosuvastatin (CRESTOR) 5 MG tablet      take 1 tablet by mouth once daily   90 tablet   3   . sertraline (ZOLOFT) 100 MG tablet      take 1 tablet by mouth once daily   90 tablet   1   . triamcinolone (NASACORT) 55 MCG/ACT nasal inhaler      instill 2 sprays into each nostril once daily   148.5 g   0   . ondansetron (ZOFRAN) 4 MG tablet   Oral   Take 1 tablet (4 mg total) by mouth every 6 (six) hours.   12 tablet   0   . sulfamethoxazole-trimethoprim (BACTRIM DS) 800-160 MG per tablet   Oral   Take 1 tablet by mouth 2 (two) times daily.   8 tablet   0    BP 112/63  Pulse 109  Temp(Src) 99.1 F (37.3 C) (Oral)  Resp 16  SpO2 94% Physical Exam  Nursing note and vitals reviewed. Constitutional: She is oriented to person, place, and time. She appears well-developed and well-nourished. No distress.  Laying in bed. No acute distress.  HENT:  Head: Normocephalic and atraumatic.  Eyes: Conjunctivae are normal. Right eye exhibits no discharge. Left eye exhibits no discharge.  Neck: Neck supple.   Cardiovascular: Normal rate, regular rhythm and normal heart sounds.  Exam reveals no gallop and no friction rub.   No murmur heard. Pulmonary/Chest: Effort normal and breath sounds normal. No respiratory distress.  Abdominal: Soft. She exhibits no distension. There is no tenderness.  Genitourinary:  No CVA tenderness  Musculoskeletal: She exhibits no edema and no tenderness.  Neurological: She is alert and oriented to person, place, and time.  Skin: Skin is warm and dry. She is not diaphoretic.  Psychiatric: She has a normal mood and affect. Her behavior is normal. Thought content normal.    ED Course  Procedures (including critical care time) Labs Review Labs Reviewed  CBC WITH DIFFERENTIAL - Abnormal; Notable for the following:    WBC 13.2 (*)    Platelets 143 (*)    Neutrophils Relative % 88 (*)    Neutro Abs 11.7 (*)    Lymphocytes Relative 3 (*)    Lymphs Abs 0.4 (*)    Monocytes Absolute 1.1 (*)    All other components within normal limits  URINALYSIS, ROUTINE W REFLEX MICROSCOPIC - Abnormal; Notable for the following:    APPearance CLOUDY (*)    Hgb urine dipstick MODERATE (*)    Ketones, ur 15 (*)    Protein, ur 100 (*)    Nitrite POSITIVE (*)    Leukocytes, UA MODERATE (*)    All other components within normal limits  URINE MICROSCOPIC-ADD ON - Abnormal; Notable for the following:    Bacteria, UA MANY (*)    All other components within normal limits  POCT I-STAT, CHEM 8 - Abnormal; Notable for the following:    Creatinine, Ser 1.30 (*)    Glucose, Bld 129 (*)    Calcium, Ion 1.08 (*)    All other components within normal limits  URINE CULTURE   Imaging Review Dg Chest 2 View  07/17/2013   CLINICAL DATA:  Flu-like symptoms  EXAM: CHEST  2 VIEW  COMPARISON:  03/07/2010  FINDINGS: The cardiac shadow is within normal limits. The lungs are clear bilaterally. Postsurgical changes are again noted on the left. No acute bony abnormality is seen.  IMPRESSION: No acute  abnormality noted.   Electronically Signed   By: Inez Catalina M.D.   On: 07/17/2013 21:30    EKG Interpretation   None       MDM   1. UTI (lower urinary tract infection)    79 year old female with fever, nausea and vomiting. Clinically she appears well. No specific urinary complaints, but urinalysis is certainly consistent with UTI. Feeling better with fluids. No vomiting throughout ED stay. I feel she is appropriate for outpatient therapy at this time. Will give a dose of Rocephin prior discharge. Outpt FU. Return precautions discussed.     Virgel Manifold, MD 07/18/13 [phone]

## 2013-07-20 LAB — URINE CULTURE: Colony Count: >=100000

## 2013-07-22 NOTE — ED Notes (Signed)
+   Urine  No treatment needed per Delos Haring.

## 2013-08-27 DIAGNOSIS — L719 Rosacea, unspecified: Secondary | ICD-10-CM | POA: Diagnosis not present

## 2013-08-27 DIAGNOSIS — C4441 Basal cell carcinoma of skin of scalp and neck: Secondary | ICD-10-CM | POA: Diagnosis not present

## 2013-08-27 DIAGNOSIS — C44319 Basal cell carcinoma of skin of other parts of face: Secondary | ICD-10-CM | POA: Diagnosis not present

## 2013-09-21 ENCOUNTER — Telehealth: Payer: Self-pay | Admitting: Internal Medicine

## 2013-09-21 NOTE — Telephone Encounter (Signed)
Spoke with patient and she states she is having more frequent, sometimes urgent formed stools. She has tried Metamucil, Imodium and Pepto bismoll without relief. States she took Dicyclomine before meals in the past and it helped but she does not have any of it currently. Please, advise.

## 2013-09-21 NOTE — Telephone Encounter (Signed)
Please refill Bentyl 10 mg ,#60, 1 po bid 2 refills

## 2013-09-22 MED ORDER — DICYCLOMINE HCL 10 MG PO CAPS: ORAL_CAPSULE | ORAL | Status: DC

## 2013-09-22 NOTE — Telephone Encounter (Signed)
Patient given recommendations. Rx sent.

## 2013-10-18 ENCOUNTER — Other Ambulatory Visit: Payer: Self-pay

## 2013-10-18 MED ORDER — DICYCLOMINE HCL 10 MG PO CAPS
ORAL_CAPSULE | ORAL | Status: DC
Start: 2013-10-18 — End: 2014-05-13

## 2013-11-11 ENCOUNTER — Other Ambulatory Visit: Payer: Self-pay | Admitting: Internal Medicine

## 2013-11-24 ENCOUNTER — Telehealth: Payer: Self-pay | Admitting: Internal Medicine

## 2013-11-24 DIAGNOSIS — M2010 Hallux valgus (acquired), unspecified foot: Secondary | ICD-10-CM

## 2013-11-24 DIAGNOSIS — C44319 Basal cell carcinoma of skin of other parts of face: Secondary | ICD-10-CM | POA: Diagnosis not present

## 2013-11-24 DIAGNOSIS — Z85828 Personal history of other malignant neoplasm of skin: Secondary | ICD-10-CM | POA: Diagnosis not present

## 2013-11-24 MED ORDER — ESTRADIOL 10 MCG VA TABS: ORAL_TABLET | VAGINAL | Status: DC

## 2013-11-24 NOTE — Telephone Encounter (Signed)
Sent by e-scribe. 

## 2013-11-24 NOTE — Telephone Encounter (Signed)
[facility] requesting refill of Estradiol (VAGIFEM) 10 MCG TABS # 36

## 2013-12-14 ENCOUNTER — Ambulatory Visit (INDEPENDENT_AMBULATORY_CARE_PROVIDER_SITE_OTHER): Payer: Medicare Other | Admitting: Internal Medicine

## 2013-12-14 ENCOUNTER — Encounter: Payer: Self-pay | Admitting: Internal Medicine

## 2013-12-14 VITALS — BP 126/80 | Temp 98.1°F | Ht 64.5 in | Wt 164.0 lb

## 2013-12-14 DIAGNOSIS — Z23 Encounter for immunization: Secondary | ICD-10-CM

## 2013-12-14 DIAGNOSIS — R739 Hyperglycemia, unspecified: Secondary | ICD-10-CM

## 2013-12-14 DIAGNOSIS — Z79899 Other long term (current) drug therapy: Secondary | ICD-10-CM

## 2013-12-14 DIAGNOSIS — M1990 Unspecified osteoarthritis, unspecified site: Secondary | ICD-10-CM

## 2013-12-14 DIAGNOSIS — R946 Abnormal results of thyroid function studies: Secondary | ICD-10-CM

## 2013-12-14 DIAGNOSIS — E785 Hyperlipidemia, unspecified: Secondary | ICD-10-CM

## 2013-12-14 DIAGNOSIS — R7989 Other specified abnormal findings of blood chemistry: Secondary | ICD-10-CM

## 2013-12-14 DIAGNOSIS — Z853 Personal history of malignant neoplasm of breast: Secondary | ICD-10-CM | POA: Diagnosis not present

## 2013-12-14 DIAGNOSIS — G4700 Insomnia, unspecified: Secondary | ICD-10-CM

## 2013-12-14 DIAGNOSIS — R7309 Other abnormal glucose: Secondary | ICD-10-CM

## 2013-12-14 DIAGNOSIS — F4322 Adjustment disorder with anxiety: Secondary | ICD-10-CM

## 2013-12-14 LAB — LIPID PANEL
Cholesterol: 174 mg/dL (ref 0–200)
HDL: 76.6 mg/dL (ref >39.00)
LDL Cholesterol: 79 mg/dL (ref 0–99)
Total CHOL/HDL Ratio: 2
Triglycerides: 92 mg/dL (ref 0.0–149.0)
VLDL: 18.4 mg/dL (ref 0.0–40.0)

## 2013-12-14 LAB — BASIC METABOLIC PANEL
BUN: 20 mg/dL (ref 6–23)
CHLORIDE: 105 meq/L (ref 96–112)
CO2: 28 mEq/L (ref 19–32)
Calcium: 9.6 mg/dL (ref 8.4–10.5)
Creatinine, Ser: 1.1 mg/dL (ref 0.4–1.2)
GFR: 51.57 mL/min — ABNORMAL LOW (ref >60.00)
Glucose, Bld: 97 mg/dL (ref 70–99)
POTASSIUM: 5.3 meq/L — AB (ref 3.5–5.1)
Sodium: 141 mEq/L (ref 135–145)

## 2013-12-14 LAB — CBC WITH DIFFERENTIAL/PLATELET
BASOS PCT: 0.3 % (ref 0.0–3.0)
Basophils Absolute: 0 10*3/uL (ref 0.0–0.1)
EOS PCT: 4.9 % (ref 0.0–5.0)
Eosinophils Absolute: 0.3 10*3/uL (ref 0.0–0.7)
HEMATOCRIT: 39.4 % (ref 36.0–46.0)
HEMOGLOBIN: 13.1 g/dL (ref 12.0–15.0)
LYMPHS ABS: 1.4 10*3/uL (ref 0.7–4.0)
Lymphocytes Relative: 21.4 % (ref 12.0–46.0)
MCHC: 33.1 g/dL (ref 30.0–36.0)
MCV: 94.5 fl (ref 78.0–100.0)
Monocytes Absolute: 0.7 10*3/uL (ref 0.1–1.0)
Monocytes Relative: 9.9 % (ref 3.0–12.0)
Neutro Abs: 4.2 10*3/uL (ref 1.4–7.7)
Neutrophils Relative %: 63.5 % (ref 43.0–77.0)
Platelets: 183 10*3/uL (ref 150.0–400.0)
RBC: 4.17 Mil/uL (ref 3.87–5.11)
RDW: 13.3 % (ref 11.5–14.6)
WBC: 6.7 10*3/uL (ref 4.5–10.5)

## 2013-12-14 LAB — TSH: TSH: 1.55 u[IU]/mL (ref 0.35–5.50)

## 2013-12-14 LAB — HEPATIC FUNCTION PANEL
ALBUMIN: 4.1 g/dL (ref 3.5–5.2)
ALT: 19 U/L (ref 0–35)
AST: 27 U/L (ref 0–37)
Alkaline Phosphatase: 91 U/L (ref 39–117)
Bilirubin, Direct: 0 mg/dL (ref 0.0–0.3)
Total Bilirubin: 0.5 mg/dL (ref 0.3–1.2)
Total Protein: 6.8 g/dL (ref 6.0–8.3)

## 2013-12-14 LAB — HEMOGLOBIN A1C: Hgb A1c MFr Bld: 6.3 % (ref 4.6–6.5)

## 2013-12-14 MED ORDER — AMBIEN 10 MG PO TABS: ORAL_TABLET | ORAL | Status: DC

## 2013-12-14 MED ORDER — ESTRADIOL 10 MCG VA TABS: ORAL_TABLET | VAGINAL | Status: DC

## 2013-12-14 NOTE — Patient Instructions (Signed)
I agree to stay off the hydroxyzine   Ambien has some risk but  could take if benefit better than risk. As needed tylenol safer for kidneys than the celebrex. .  Will notify you  of labs when available. gald you are doing well.  consider trying probiotic for the bowel for a month and see if helps.    Fall Prevention and Home Safety Falls cause injuries and can affect all age groups. It is possible to use preventive measures to significantly decrease the likelihood of falls. There are many simple measures which can make your home safer and prevent falls. OUTDOORS  Repair cracks and edges of walkways and driveways.  Remove high doorway thresholds.  Trim shrubbery on the main path into your home.  Have good outside lighting.  Clear walkways of tools, rocks, debris, and clutter.  Check that handrails are not broken and are securely fastened. Both sides of steps should have handrails.  Have leaves, snow, and ice cleared regularly.  Use sand or salt on walkways during winter months.  In the garage, clean up grease or oil spills. BATHROOM  Install night lights.  Install grab bars by the toilet and in the tub and shower.  Use non-skid mats or decals in the tub or shower.  Place a plastic non-slip stool in the shower to sit on, if needed.  Keep floors dry and clean up all water on the floor immediately.  Remove soap buildup in the tub or shower on a regular basis.  Secure bath mats with non-slip, double-sided rug tape.  Remove throw rugs and tripping hazards from the floors. BEDROOMS  Install night lights.  Make sure a bedside light is easy to reach.  Do not use oversized bedding.  Keep a telephone by your bedside.  Have a firm chair with side arms to use for getting dressed.  Remove throw rugs and tripping hazards from the floor. KITCHEN  Keep handles on pots and pans turned toward the center of the stove. Use back burners when possible.  Clean up spills  quickly and allow time for drying.  Avoid walking on wet floors.  Avoid hot utensils and knives.  Position shelves so they are not too high or low.  Place commonly used objects within easy reach.  If necessary, use a sturdy step stool with a grab bar when reaching.  Keep electrical cables out of the way.  Do not use floor polish or wax that makes floors slippery. If you must use wax, use non-skid floor wax.  Remove throw rugs and tripping hazards from the floor. STAIRWAYS  Never leave objects on stairs.  Place handrails on both sides of stairways and use them. Fix any loose handrails. Make sure handrails on both sides of the stairways are as long as the stairs.  Check carpeting to make sure it is firmly attached along stairs. Make repairs to worn or loose carpet promptly.  Avoid placing throw rugs at the top or bottom of stairways, or properly secure the rug with carpet tape to prevent slippage. Get rid of throw rugs, if possible.  Have an electrician put in a light switch at the top and bottom of the stairs. OTHER FALL PREVENTION TIPS  Wear low-heel or rubber-soled shoes that are supportive and fit well. Wear closed toe shoes.  When using a stepladder, make sure it is fully opened and both spreaders are firmly locked. Do not climb a closed stepladder.  Add color or contrast paint or tape to  grab bars and handrails in your home. Place contrasting color strips on first and last steps.  Learn and use mobility aids as needed. Install an electrical emergency response system.  Turn on lights to avoid dark areas. Replace light bulbs that burn out immediately. Get light switches that glow.  Arrange furniture to create clear pathways. Keep furniture in the same place.  Firmly attach carpet with non-skid or double-sided tape.  Eliminate uneven floor surfaces.  Select a carpet pattern that does not visually hide the edge of steps.  Be aware of all pets. OTHER HOME SAFETY  TIPS  Set the water temperature for 120 F (48.8 C).  Keep emergency numbers on or near the telephone.  Keep smoke detectors on every level of the home and near sleeping areas. Document Released: 08/23/2002 Document Revised: 03/03/2012 Document Reviewed: 11/22/2011 [facility] Patient Information 2014 [place].

## 2013-12-14 NOTE — Progress Notes (Signed)
Chief Complaint  Patient presents with  . Follow-up    medication eval    HPI: Jeanne Tran  comes in today for follow up of  multiple medical problems.   Stopped  The hydroxizine cause of warnings.    Sleep not as good. But coping with this still using brand ambien and seems to do well . Denies sif fogginess amnesia  Noted ipariment. Active no falling plays bridge . Social .  GERD: reflu x on purple pill and bentyl  Every day  After colon and uti.   Mood  Doing well no depression  Some problem when tried to dec zoloft in past . So staing on med   LIPID: no se of med taking  Having some gi sx after virus  Antibiotic for UTIetc bently not helping that much  No change in habits  DJD not using celebrex qd  Asks about management  Good day sna bad days depending on weather ROS: See pertinent positives and negatives per HPI.  Past Medical History  Diagnosis Date  . Hyperlipidemia   . RLS (restless legs syndrome)   . Breast cancer     hx of left with radiation and lumpectomy  . History of colonic polyps   . GERD (gastroesophageal reflux disease)   . History of ERCP     and sphincterotomy for abnormal lfts and dilated biliary ducts 5/05  . Pulmonary nodule   . Migraine headache     No family history on file.  History   Social History  . Marital Status: Widowed    Spouse Name: N/A    Number of Children: N/A  . Years of Education: N/A   Social History Main Topics  . Smoking status: Former Smoker -- 3.00 packs/day for 25 years    Types: Cigarettes    Quit date: 02/26/1994  . Smokeless tobacco: Never Used  . Alcohol Use: No  . Drug Use: No  . Sexual Activity: None   Other Topics Concern  . 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                    Outpatient Encounter Prescriptions as of 12/14/2013  Medication Sig  . AMBIEN 10 MG tablet take 1 tablet by mouth at bedtime if needed    . BIOTIN 5000 PO Take 5,000 mg by mouth every other day.   . dicyclomine (BENTYL) 10 MG capsule Take one po BID  . Estradiol (VAGIFEM) 10 MCG TABS vaginal tablet insert 1 tablet vaginally THREE TIMES A WEEK  . fish oil-omega-3 fatty acids 1000 MG capsule Take 1 g by mouth daily.   . fluocinonide cream (LIDEX) 0.05 % Apply topically 2 (two) times daily. To itchy rashas needed, not on face  . Multiple Vitamin (MULTIVITAMIN WITH MINERALS) TABS tablet Take 1 tablet by mouth daily.  Marland Kitchen NEXIUM 40 MG capsule take 1 capsule by mouth once daily  . rosuvastatin (CRESTOR) 5 MG tablet take 1 tablet by mouth once daily  . sertraline (ZOLOFT) 100 MG tablet take 1 tablet by mouth once daily  . [DISCONTINUED] AMBIEN 10 MG tablet take 1 tablet by mouth at bedtime if needed  . [DISCONTINUED] Estradiol (VAGIFEM) 10 MCG TABS vaginal tablet insert 1 tablet vaginally THREE TIMES A WEEK  . triamcinolone (NASACORT) 55 MCG/ACT nasal inhaler instill  2 sprays into each nostril once daily  . [DISCONTINUED] hydrOXYzine (ATARAX/VISTARIL) 25 MG tablet take 1 tablet by mouth at bedtime every other day  . [DISCONTINUED] ondansetron (ZOFRAN) 4 MG tablet Take 1 tablet (4 mg total) by mouth every 6 (six) hours.  . [DISCONTINUED] sulfamethoxazole-trimethoprim (BACTRIM DS) 800-160 MG per tablet Take 1 tablet by mouth 2 (two) times daily.  . [DISCONTINUED] triamcinolone (NASACORT) 55 MCG/ACT AERO nasal inhaler instill 2 sprays into each nostril once daily    EXAM:  BP 126/80  Temp(Src) 98.1 F (36.7 C) (Oral)  Ht 5' 4.5" (1.638 m)  Wt 164 lb (74.39 kg)  BMI 27.73 kg/m2  Body mass index is 27.73 kg/(m^2).  GENERAL: vitals reviewed and listed above, alert, oriented, appears well hydrated and in no acute distress HEENT: atraumatic, conjunctiva  clear, no obvious abnormalities on inspection of external nose and ears OP : no lesion edema or exudate  NECK: no obvious masses on inspection palpation  LUNGS: clear to auscultation  bilaterally, no wheezes, rales or rhonchi, good air movement CV: HRRR, no clubbing cyanosis or  peripheral edema nl cap refill  MS: moves all extremities without noticeable focal  abnormality PSYCH: pleasant and cooperative, no obvious depression or anxiety Lab Results  Component Value Date   WBC 6.7 12/14/2013   HGB 13.1 12/14/2013   HCT 39.4 12/14/2013   PLT 183.0 12/14/2013   GLUCOSE 97 12/14/2013   CHOL 174 12/14/2013   TRIG 92.0 12/14/2013   HDL 76.60 12/14/2013   LDLCALC 79 12/14/2013   ALT 19 12/14/2013   AST 27 12/14/2013   NA 141 12/14/2013   K 5.3* 12/14/2013   CL 105 12/14/2013   CREATININE 1.1 12/14/2013   BUN 20 12/14/2013   CO2 28 12/14/2013   TSH 1.55 12/14/2013   INR 0.93 10/03/2010   HGBA1C 6.3 12/14/2013    ASSESSMENT AND PLAN:  Discussed the following assessment and plan:  Other and unspecified hyperlipidemia - Plan: Basic metabolic panel, CBC with Differential, Hemoglobin A1c, Hepatic function panel, Lipid panel, TSH  High risk medication use - Plan: Basic metabolic panel, CBC with Differential, Hemoglobin A1c, Hepatic function panel, Lipid panel, TSH  Persistent disorder of initiating or maintaining sleep - Plan: Basic metabolic panel, CBC with Differential, Hemoglobin A1c, Hepatic function panel, Lipid panel, TSH  Personal history of malignant neoplasm of breast - Plan: Basic metabolic panel, CBC with Differential, Hemoglobin A1c, Hepatic function panel, Lipid panel, TSH  Adjustment disorder with anxiety - Plan: Basic metabolic panel, CBC with Differential, Hemoglobin A1c, Hepatic function panel, Lipid panel, TSH  Abnormal TSH - Plan: Basic metabolic panel, CBC with Differential, Hemoglobin A1c, Hepatic function panel, Lipid panel, TSH, AMBIEN 10 MG tablet  Hyperglycemia - Plan: Basic metabolic panel, CBC with Differential, Hemoglobin A1c, Hepatic function panel, Lipid panel, TSH  Medication management - Plan: Basic metabolic panel, CBC with Differential, Hemoglobin  A1c, Hepatic function panel, Lipid panel, TSH  INSOMNIA, CHRONIC - Plan: AMBIEN 10 MG tablet  Need for prophylactic vaccination and inoculation against influenza - Plan: AMBIEN 10 MG tablet  HYPERLIPIDEMIA - check labs - Plan: AMBIEN 10 MG tablet  INSOMNIA, CHRONIC, Chronic - Plan: AMBIEN 10 MG tablet  DEGENERATIVE JOINT DISEASE - on celebrex - Plan: AMBIEN 10 MG tablet  Need for TD vaccine - Plan: Tetanus vaccine IM  Need for vaccination with 13-polyvalent pneumococcal conjugate vaccine - Plan: Pneumococcal conjugate vaccine 13-valent Counseled. Poss minor flare IBS  Total visit 38mins > 50% spent counseling and  coordinating care   -Patient advised to return or notify health care team  if symptoms worsen ,persist or new concerns arise.  Patient Instructions  I agree to stay off the hydroxyzine   Ambien has some risk but  could take if benefit better than risk. As needed tylenol safer for kidneys than the celebrex. .  Will notify you  of labs when available. gald you are doing well.  consider trying probiotic for the bowel for a month and see if helps.    Fall Prevention and Home Safety Falls cause injuries and can affect all age groups. It is possible to use preventive measures to significantly decrease the likelihood of falls. There are many simple measures which can make your home safer and prevent falls. OUTDOORS  Repair cracks and edges of walkways and driveways.  Remove high doorway thresholds.  Trim shrubbery on the main path into your home.  Have good outside lighting.  Clear walkways of tools, rocks, debris, and clutter.  Check that handrails are not broken and are securely fastened. Both sides of steps should have handrails.  Have leaves, snow, and ice cleared regularly.  Use sand or salt on walkways during winter months.  In the garage, clean up grease or oil spills. BATHROOM  Install night lights.  Install grab bars by the toilet and in the tub and  shower.  Use non-skid mats or decals in the tub or shower.  Place a plastic non-slip stool in the shower to sit on, if needed.  Keep floors dry and clean up all water on the floor immediately.  Remove soap buildup in the tub or shower on a regular basis.  Secure bath mats with non-slip, double-sided rug tape.  Remove throw rugs and tripping hazards from the floors. BEDROOMS  Install night lights.  Make sure a bedside light is easy to reach.  Do not use oversized bedding.  Keep a telephone by your bedside.  Have a firm chair with side arms to use for getting dressed.  Remove throw rugs and tripping hazards from the floor. KITCHEN  Keep handles on pots and pans turned toward the center of the stove. Use back burners when possible.  Clean up spills quickly and allow time for drying.  Avoid walking on wet floors.  Avoid hot utensils and knives.  Position shelves so they are not too high or low.  Place commonly used objects within easy reach.  If necessary, use a sturdy step stool with a grab bar when reaching.  Keep electrical cables out of the way.  Do not use floor polish or wax that makes floors slippery. If you must use wax, use non-skid floor wax.  Remove throw rugs and tripping hazards from the floor. STAIRWAYS  Never leave objects on stairs.  Place handrails on both sides of stairways and use them. Fix any loose handrails. Make sure handrails on both sides of the stairways are as long as the stairs.  Check carpeting to make sure it is firmly attached along stairs. Make repairs to worn or loose carpet promptly.  Avoid placing throw rugs at the top or bottom of stairways, or properly secure the rug with carpet tape to prevent slippage. Get rid of throw rugs, if possible.  Have an electrician put in a light switch at the top and bottom of the stairs. OTHER FALL PREVENTION TIPS  Wear low-heel or rubber-soled shoes that are supportive and fit well. Wear  closed toe shoes.  When using a stepladder,  make sure it is fully opened and both spreaders are firmly locked. Do not climb a closed stepladder.  Add color or contrast paint or tape to grab bars and handrails in your home. Place contrasting color strips on first and last steps.  Learn and use mobility aids as needed. Install an electrical emergency response system.  Turn on lights to avoid dark areas. Replace light bulbs that burn out immediately. Get light switches that glow.  Arrange furniture to create clear pathways. Keep furniture in the same place.  Firmly attach carpet with non-skid or double-sided tape.  Eliminate uneven floor surfaces.  Select a carpet pattern that does not visually hide the edge of steps.  Be aware of all pets. OTHER HOME SAFETY TIPS  Set the water temperature for 120 F (48.8 C).  Keep emergency numbers on or near the telephone.  Keep smoke detectors on every level of the home and near sleeping areas. Document Released: 08/23/2002 Document Revised: 03/03/2012 Document Reviewed: 11/22/2011 [facility] Patient Information 2014 [place].      Standley Brooking. Panosh M.D.  Pre visit review using our clinic review tool, if applicable. No additional management support is needed unless otherwise documented below in the visit note.

## 2013-12-17 NOTE — Assessment & Plan Note (Signed)
Benefit still more than risk at this time. Is off th hydroxyzine

## 2013-12-17 NOTE — Assessment & Plan Note (Signed)
stable doing great  . continue

## 2013-12-29 DIAGNOSIS — M2010 Hallux valgus (acquired), unspecified foot: Secondary | ICD-10-CM

## 2014-01-09 ENCOUNTER — Other Ambulatory Visit: Payer: Self-pay | Admitting: Internal Medicine

## 2014-01-14 ENCOUNTER — Other Ambulatory Visit: Payer: Self-pay | Admitting: Internal Medicine

## 2014-02-06 ENCOUNTER — Other Ambulatory Visit: Payer: Self-pay | Admitting: Internal Medicine

## 2014-02-08 NOTE — Telephone Encounter (Signed)
Called and spoke to the pharmacy.  Patient did not turn in printed script from 12/14/13 to the pharmacy. #90 with 1 additional refill.  Pharmacist said the last prescription they gave her was 05/2013.  Since Dr. Regis Bill only wanted her to have 6 months worth (would have been Sept) I authorized 1 90 day prescription (August 2015),  She will need to ask for further refills.

## 2014-02-10 DIAGNOSIS — H2510 Age-related nuclear cataract, unspecified eye: Secondary | ICD-10-CM | POA: Diagnosis not present

## 2014-02-23 ENCOUNTER — Other Ambulatory Visit: Payer: Self-pay | Admitting: Internal Medicine

## 2014-02-23 NOTE — Telephone Encounter (Signed)
Sent to the pharmacy by e-scribe. 

## 2014-02-24 ENCOUNTER — Telehealth: Payer: Self-pay | Admitting: Internal Medicine

## 2014-02-24 ENCOUNTER — Ambulatory Visit (INDEPENDENT_AMBULATORY_CARE_PROVIDER_SITE_OTHER): Payer: Medicare Other | Admitting: Family Medicine

## 2014-02-24 ENCOUNTER — Encounter: Payer: Self-pay | Admitting: Family Medicine

## 2014-02-24 VITALS — BP 130/70 | HR 78 | Temp 98.5°F | Wt 158.0 lb

## 2014-02-24 DIAGNOSIS — R195 Other fecal abnormalities: Secondary | ICD-10-CM | POA: Diagnosis not present

## 2014-02-24 LAB — CBC WITH DIFFERENTIAL/PLATELET
BASOS PCT: 0.9 % (ref 0.0–3.0)
Basophils Absolute: 0 10*3/uL (ref 0.0–0.1)
Eosinophils Absolute: 0.3 10*3/uL (ref 0.0–0.7)
Eosinophils Relative: 5.2 % — ABNORMAL HIGH (ref 0.0–5.0)
HEMATOCRIT: 39 % (ref 36.0–46.0)
Hemoglobin: 13.1 g/dL (ref 12.0–15.0)
Lymphocytes Relative: 26.8 % (ref 12.0–46.0)
Lymphs Abs: 1.4 10*3/uL (ref 0.7–4.0)
MCHC: 33.5 g/dL (ref 30.0–36.0)
MCV: 92.5 fl (ref 78.0–100.0)
MONO ABS: 0.6 10*3/uL (ref 0.1–1.0)
MONOS PCT: 10.8 % (ref 3.0–12.0)
Neutro Abs: 3 10*3/uL (ref 1.4–7.7)
Neutrophils Relative %: 56.3 % (ref 43.0–77.0)
Platelets: 201 10*3/uL (ref 150.0–400.0)
RBC: 4.22 Mil/uL (ref 3.87–5.11)
RDW: 14.1 % (ref 11.5–15.5)
WBC: 5.3 10*3/uL (ref 4.0–10.5)

## 2014-02-24 NOTE — Progress Notes (Signed)
   Subjective:    Patient ID: Jeanne Tran, female    DOB: [birth year], 80 y.o.   MRN: [id number]  HPI  Patient seen with chief complaint of black stools over the past 4 days. Otherwise, her bowel movements have been normal. She's had normal consistency and frequency of bowel movements. She has not had any diarrhea or constipation. No visible bloody stools. No recent Pepto-Bismol use. No dietary changes. No new medications. She denies any abdominal pain, dizziness, nonsteroidal use, or any alcohol use. She is an ex-smoker. Vague history of remote peptic ulcer years ago. She takes Nexium for GERD. She had colonoscopy September of 2014 which showed diverticulosis sigmoid colon otherwise normal.  Past Medical History  Diagnosis Date  . Hyperlipidemia   . RLS (restless legs syndrome)   . Breast cancer     hx of left with radiation and lumpectomy  . History of colonic polyps   . GERD (gastroesophageal reflux disease)   . History of ERCP     and sphincterotomy for abnormal lfts and dilated biliary ducts 5/05  . Pulmonary nodule   . Migraine headache    Past Surgical History  Procedure Laterality Date  . Ercp      for abnormal lfts and dilated biliary ducts 5/05  . Sphincterotomy      for abnormal lfts and dilated biliary ducts 5/05  . Lymph node biopsy    . Cystocele repair    . Appendectomy    . Hemorrhoid surgery    . Breast lumpectomy    . Tonsillectomy    . Cervical spine surgery  3/03  . Gallbladder duct open  2006  . Cholecystectomy  2012  . Orthoscopic rt knee    . Hysteroscopy    . Complete hysterectomy      reports that she quit smoking about 20 years ago. Her smoking use included Cigarettes. She has a 75 pack-year smoking history. She has never used smokeless tobacco. She reports that she does not drink alcohol or use illicit drugs. family history is not on file. Allergies  Allergen Reactions  . Codeine     REACTION: Intolerance w/ pain meds not cough syrup       Review of Systems  Constitutional: Negative for fever, chills, appetite change and unexpected weight change.  HENT: Negative for trouble swallowing.   Respiratory: Negative for shortness of breath.   Cardiovascular: Negative for chest pain.  Gastrointestinal: Negative for nausea, vomiting, abdominal pain, diarrhea and rectal pain.  Neurological: Negative for dizziness.       Objective:   Physical Exam  Constitutional: She appears well-developed and well-nourished.  Cardiovascular: Normal rate and regular rhythm.   Pulmonary/Chest: Effort normal and breath sounds normal. No respiratory distress. She has no wheezes. She has no rales.  Abdominal: Soft. Bowel sounds are normal. She exhibits no distension and no mass. There is no rebound and no guarding.  Minimally tender epigastric area. She states this is chronic  Genitourinary:  Normal sphincter tone. No rectal mass. She has some stool which is fairly dark green in color in the rectal vault. No obvious blood. Hemoccult-negative          Assessment & Plan:  Dark-colored stools. Patient was concerned about possible melena. On exam she has dark green stools which are Hemoccult negative. Check CBC though suspect this will be normal. Continue Nexium. If hemoglobin normal observe for now. Followup promptly for any dizziness or abdominal pain

## 2014-02-24 NOTE — Patient Instructions (Signed)
Followup promptly for any bloody stools, dizziness, or any abdominal pain

## 2014-02-24 NOTE — Telephone Encounter (Signed)
Noted  

## 2014-02-24 NOTE — Telephone Encounter (Signed)
Patient Information:  Caller Name: Trayana  Phone: [phone]  Patient: Jeanne Tran, Jeanne Tran  Gender: Female  DOB: 1934-04-09  Age: 80 Years  PCP: Shanon Ace (Family Practice)  Office Follow Up:  Does the office need to follow up with this patient?: No  Instructions For The Office: N/A  RN Note:  [facility] ED VS OFFICE.  Patient is stable. May be seen in the office. Patient PCP is not in office/scheduled with another provider for evaluation. Dr. Elease Hashimoto MD at 10:45 today 02/24/14. Care advice given . Understanding expressed.  Symptoms  Reason For Call & Symptoms: Patient states 3-4 days her stools are black.  She reports everything is normal other than color. No new products or foods.  No n/v/d.   Last stool yesterday 02/23/14.  No pain or discomfort . Afebrile. Eating and drinking noramlly.  Reviewed Health History In EMR: Yes  Reviewed Medications In EMR: Yes  Reviewed Allergies In EMR: Yes  Reviewed Surgeries / Procedures: Yes  Date of Onset of Symptoms: 02/21/2014  Guideline(s) Used:  Rectal Bleeding  Disposition Per Guideline:   Go to ED Now (or to Office with PCP Approval)  Reason For Disposition Reached:   Bloody, black, or tarry bowel movements  Advice Given:  Call Back If:  Bleeding increases in amount  Bleeding occurs 3 or more times after treatment begins  You become worse.  Patient Will Follow Care Advice:  YES  Appointment Scheduled:  02/24/2014 10:45:00 Appointment Scheduled Provider:  Carolann Littler [facility])

## 2014-02-24 NOTE — Progress Notes (Signed)
Pre visit review using our clinic review tool, if applicable. No additional management support is needed unless otherwise documented below in the visit note. 

## 2014-04-11 ENCOUNTER — Other Ambulatory Visit: Payer: Self-pay | Admitting: Family Medicine

## 2014-04-11 DIAGNOSIS — H539 Unspecified visual disturbance: Secondary | ICD-10-CM

## 2014-04-12 ENCOUNTER — Other Ambulatory Visit: Payer: Self-pay | Admitting: Internal Medicine

## 2014-05-13 ENCOUNTER — Other Ambulatory Visit: Payer: Self-pay | Admitting: *Deleted

## 2014-05-13 MED ORDER — DICYCLOMINE HCL 10 MG PO CAPS: ORAL_CAPSULE | ORAL | Status: DC

## 2014-05-16 DIAGNOSIS — H02409 Unspecified ptosis of unspecified eyelid: Secondary | ICD-10-CM | POA: Diagnosis not present

## 2014-05-19 ENCOUNTER — Ambulatory Visit (INDEPENDENT_AMBULATORY_CARE_PROVIDER_SITE_OTHER)
Admission: RE | Admit: 2014-05-19 | Discharge: 2014-05-19 | Disposition: A | Discharge status: Home / Self Care | Payer: Medicare Other | Source: Ambulatory Visit | Attending: Internal Medicine | Admitting: Internal Medicine

## 2014-05-19 ENCOUNTER — Encounter: Payer: Self-pay | Admitting: Internal Medicine

## 2014-05-19 ENCOUNTER — Ambulatory Visit (INDEPENDENT_AMBULATORY_CARE_PROVIDER_SITE_OTHER): Payer: Medicare Other | Admitting: Internal Medicine

## 2014-05-19 VITALS — BP 124/74 | Temp 98.1°F | Ht 64.5 in | Wt 158.0 lb

## 2014-05-19 DIAGNOSIS — M25439 Effusion, unspecified wrist: Secondary | ICD-10-CM | POA: Diagnosis not present

## 2014-05-19 DIAGNOSIS — M19039 Primary osteoarthritis, unspecified wrist: Secondary | ICD-10-CM | POA: Diagnosis not present

## 2014-05-19 LAB — CBC WITH DIFFERENTIAL/PLATELET
Basophils Absolute: 0 10*3/uL (ref 0.0–0.1)
Basophils Relative: 0.5 % (ref 0.0–3.0)
EOS PCT: 4.7 % (ref 0.0–5.0)
Eosinophils Absolute: 0.3 10*3/uL (ref 0.0–0.7)
HEMATOCRIT: 38.9 % (ref 36.0–46.0)
Hemoglobin: 13 g/dL (ref 12.0–15.0)
LYMPHS ABS: 1.4 10*3/uL (ref 0.7–4.0)
Lymphocytes Relative: 18.8 % (ref 12.0–46.0)
MCHC: 33.5 g/dL (ref 30.0–36.0)
MCV: 93.3 fl (ref 78.0–100.0)
MONO ABS: 0.7 10*3/uL (ref 0.1–1.0)
Monocytes Relative: 9.5 % (ref 3.0–12.0)
NEUTROS ABS: 4.9 10*3/uL (ref 1.4–7.7)
NEUTROS PCT: 66.5 % (ref 43.0–77.0)
Platelets: 204 10*3/uL (ref 150.0–400.0)
RBC: 4.17 Mil/uL (ref 3.87–5.11)
RDW: 13.9 % (ref 11.5–15.5)
WBC: 7.3 10*3/uL (ref 4.0–10.5)

## 2014-05-19 LAB — BASIC METABOLIC PANEL
BUN: 24 mg/dL — AB (ref 6–23)
CALCIUM: 9.5 mg/dL (ref 8.4–10.5)
CO2: 30 mEq/L (ref 19–32)
CREATININE: 1.1 mg/dL (ref 0.4–1.2)
Chloride: 103 mEq/L (ref 96–112)
GFR: 51.51 mL/min — ABNORMAL LOW (ref >60.00)
GLUCOSE: 109 mg/dL — AB (ref 70–99)
POTASSIUM: 4.2 meq/L (ref 3.5–5.1)
Sodium: 139 mEq/L (ref 135–145)

## 2014-05-19 LAB — HEPATIC FUNCTION PANEL
ALBUMIN: 3.7 g/dL (ref 3.5–5.2)
ALT: 24 U/L (ref 0–35)
AST: 31 U/L (ref 0–37)
Alkaline Phosphatase: 91 U/L (ref 39–117)
BILIRUBIN TOTAL: 0.6 mg/dL (ref 0.2–1.2)
Bilirubin, Direct: 0 mg/dL (ref 0.0–0.3)
Total Protein: 6.9 g/dL (ref 6.0–8.3)

## 2014-05-19 LAB — C-REACTIVE PROTEIN

## 2014-05-19 MED ORDER — CELECOXIB 200 MG PO CAPS: 200.0000 mg | ORAL_CAPSULE | Freq: Every day | ORAL | Status: DC

## 2014-05-19 NOTE — Patient Instructions (Addendum)
Plan  X ray and labs .   For inflammation.    celebrex short term  2 -3 weeks and then as needed with GI precautions   Follow up in a month  May consider rheumatology consult  .

## 2014-05-19 NOTE — Progress Notes (Signed)
Pre visit review using our clinic review tool, if applicable. No additional management support is needed unless otherwise documented below in the visit note.   Chief Complaint  Patient presents with  . Wrist Pain    Bilateral.  Started 10 days ago.  Rt is swollen.  Will wait on high dose flu vaccine    HPI: Patient Jeanne Tran  comes in today for SDA for  new problem evaluation. No injury insidious  Onset  Bilateral wrist pain and swelling over the last 10+ days right more than left   And more progressive .  Stays the same . ? Not stiffd  Fingers are ok.  Now soreness also . Hx of djd sspine surgery  But is active and mobile  . No hx of similar  ROS: See pertinent positives and negatives per HPI.no feer . systemic sx . recent illness gi bug or unusual rashes . Took tylenol for pain so so ok.  Tends to bruise easy no bleeding no longer on celebrex but had been on in past. No active or recent Gi bleeding  Past Medical History  Diagnosis Date  . Hyperlipidemia   . RLS (restless legs syndrome)   . Breast cancer     hx of left with radiation and lumpectomy  . History of colonic polyps   . GERD (gastroesophageal reflux disease)   . History of ERCP     and sphincterotomy for abnormal lfts and dilated biliary ducts 5/05  . Pulmonary nodule   . Migraine headache     No family history on file.  History   Social History  . Marital Status: Widowed    Spouse Name: N/A    Number of Children: N/A  . Years of Education: N/A   Social History Main Topics  . Smoking status: Former Smoker -- 3.00 packs/day for 25 years    Types: Cigarettes    Quit date: 02/26/1994  . Smokeless tobacco: Never Used  . Alcohol Use: No  . Drug Use: No  . Sexual Activity: None   Other Topics Concern  . 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                    Outpatient Encounter Prescriptions as of 05/19/2014  Medication  Sig  . AMBIEN 10 MG tablet take 1 tablet by mouth at bedtime if needed  . BIOTIN 5000 PO Take 5,000 mg by mouth every other day.   . cetirizine (ZYRTEC) 10 MG tablet Take 10 mg by mouth daily.  . Cholecalciferol (VITAMIN D3) 2000 UNITS TABS Take 1 tablet by mouth daily.  Marland Kitchen dicyclomine (BENTYL) 10 MG capsule Take one po BID  . Estradiol (VAGIFEM) 10 MCG TABS vaginal tablet insert 1 tablet vaginally THREE TIMES A WEEK  . fish oil-omega-3 fatty acids 1000 MG capsule Take 1 g by mouth daily.   . fluocinonide cream (LIDEX) 0.05 % Apply topically 2 (two) times daily. To itchy rashas needed, not on face  . Multiple Vitamin (MULTIVITAMIN WITH MINERALS) TABS tablet Take 1 tablet by mouth daily.  Marland Kitchen NEXIUM 40 MG capsule take 1 capsule by mouth once daily  . rosuvastatin (CRESTOR) 5 MG tablet take 1 tablet by mouth once daily  . sertraline (ZOLOFT) 100 MG tablet take 1 tablet by mouth once daily  . triamcinolone (NASACORT)  55 MCG/ACT AERO nasal inhaler instill 2 sprays into each nostril once daily  . triamcinolone (NASACORT) 55 MCG/ACT nasal inhaler instill 2 sprays into each nostril once daily  . celecoxib (CELEBREX) 200 MG capsule Take 1 capsule (200 mg total) by mouth daily. For 2-3 weeks then as directed    EXAM:  BP 124/74  Temp(Src) 98.1 F (36.7 C) (Oral)  Ht 5' 4.5" (1.638 m)  Wt 158 lb (71.668 kg)  BMI 26.71 kg/m2  Body mass index is 26.71 kg/(m^2).  GENERAL: vitals reviewed and listed above, alert, oriented, appears well hydrated and in no acute distress HEENT: atraumatic, conjunctiva  clear, no obvious abnormalities on inspection of external nose and ears NECK: no obvious masses on inspection palpation  LUNGS: unlabored breathing   CV: HRRR, no clubbing cyanosis or  peripheral edema nl cap refill  Skin venous legs no ulcers  Few senile echymosis none on trunk and no petechiae MS: hands prominent ulnar and slight swelling right more than left of  Wrist .  ? Warm not hot  May be some  hyperemia . rom ok some mcp prominence on right . Neuro non focal  mobile good balance  PSYCH: pleasant and cooperative, no obvious depression or anxiety  ASSESSMENT AND PLAN:  Discussed the following assessment and plan:  Wrist swelling, unspecified laterality - new onset acts inflammatory no systemic sx  or obv triggers ;check x ray lab  clebrex and fu.  - Plan: DG Wrist Complete Right, DG Wrist Complete Left, CBC with Differential, Basic metabolic panel, Hepatic function panel, Cyclic Citrul Peptide Antibody, IGG, ANA, C-reactive protein Unable to order ferritin because of medicare  Dx category   -Patient advised to return or notify health care team  if symptoms worsen ,persist or new concerns arise.  Patient Instructions  Plan  X ray and labs .   For inflammation.    celebrex short term  2 -3 weeks and then as needed with GI precautions   Follow up in a month  May consider rheumatology consult  .    Standley Brooking. Panosh M.D.

## 2014-05-20 LAB — CYCLIC CITRUL PEPTIDE ANTIBODY, IGG

## 2014-05-20 LAB — ANA: Anti Nuclear Antibody(ANA): NEGATIVE

## 2014-05-25 DIAGNOSIS — Z853 Personal history of malignant neoplasm of breast: Secondary | ICD-10-CM | POA: Diagnosis not present

## 2014-05-25 DIAGNOSIS — Z1231 Encounter for screening mammogram for malignant neoplasm of breast: Secondary | ICD-10-CM | POA: Diagnosis not present

## 2014-05-25 LAB — HM MAMMOGRAPHY

## 2014-05-27 ENCOUNTER — Encounter: Payer: Self-pay | Admitting: Internal Medicine

## 2014-06-01 ENCOUNTER — Other Ambulatory Visit: Payer: Self-pay | Admitting: Internal Medicine

## 2014-06-01 NOTE — Telephone Encounter (Signed)
Sent to the pharmacy by e-scribe. 

## 2014-06-02 DIAGNOSIS — N63 Unspecified lump in unspecified breast: Secondary | ICD-10-CM | POA: Diagnosis not present

## 2014-06-08 ENCOUNTER — Other Ambulatory Visit: Payer: Self-pay | Admitting: Radiology

## 2014-06-08 ENCOUNTER — Encounter: Payer: Self-pay | Admitting: Internal Medicine

## 2014-06-08 DIAGNOSIS — D0590 Unspecified type of carcinoma in situ of unspecified breast: Secondary | ICD-10-CM | POA: Diagnosis not present

## 2014-06-09 ENCOUNTER — Other Ambulatory Visit: Payer: Self-pay | Admitting: Radiology

## 2014-06-09 DIAGNOSIS — D0512 Intraductal carcinoma in situ of left breast: Secondary | ICD-10-CM

## 2014-06-15 ENCOUNTER — Other Ambulatory Visit (INDEPENDENT_AMBULATORY_CARE_PROVIDER_SITE_OTHER): Payer: Self-pay | Admitting: General Surgery

## 2014-06-15 ENCOUNTER — Encounter (INDEPENDENT_AMBULATORY_CARE_PROVIDER_SITE_OTHER): Payer: Self-pay | Admitting: General Surgery

## 2014-06-15 ENCOUNTER — Telehealth: Payer: Self-pay | Admitting: Family Medicine

## 2014-06-15 DIAGNOSIS — C50419 Malignant neoplasm of upper-outer quadrant of unspecified female breast: Secondary | ICD-10-CM | POA: Diagnosis not present

## 2014-06-15 DIAGNOSIS — C50412 Malignant neoplasm of upper-outer quadrant of left female breast: Secondary | ICD-10-CM

## 2014-06-15 MED ORDER — DIAZEPAM 5 MG PO TABS: ORAL_TABLET | ORAL | Status: DC

## 2014-06-15 NOTE — Progress Notes (Unsigned)
Patient ID: Jeanne Tran, female   DOB: 1934/08/27, 80 y.o.   MRN: [id number] Jeanne Tran 06/15/2014 11:42 AM Location: Jeanne Tran Patient #: [id number] DOB: March 06, 1934 Widowed / Language: Jeanne Tran / Race: White Female History of Present Illness Jeanne Hollingshead MD; 06/15/2014 12:15 PM) The patient is a 80 year old female who presents with breast cancer.  Note:She is referred by Dr. Marcelo Tran because of a new diagnosis of left breast DCIS. She has a previous history of left breast cancer and was treated with lumpectomy, axillary lymph node dissection, radiation, and tamoxifen 20 years ago. She was noted to have a new mass in the left breast on recent mammography at the 12 to 1:00 position, middle depth. Image guided biopsy was performed with the above pathology. The tumor is ER and PR positive. No family history of breast cancer. No early menarche. No late menopause. First child was born in before the age of 82. No palpable breast mass. An MRI has been ordered for later this week.  Other Problems Jeanne Tran, CMA; 06/15/2014 11:43 AM) Anxiety Disorder Arthritis Breast Cancer Gastric Ulcer Gastroesophageal Reflux Disease Hemorrhoids Hypercholesterolemia Melanoma Migraine Headache Oophorectomy  Past Surgical History Jeanne Tran, CMA; 06/15/2014 11:43 AM) Appendectomy Breast Biopsy Left. Breast Mass; Local Excision Left. Gallbladder Tran - Laparoscopic Hemorrhoidectomy Hysterectomy (not due to cancer) - Complete Knee Tran Left. Oral Tran Spinal Tran - Neck Tonsillectomy  Diagnostic Studies History Jeanne Tran, [name]; 06/15/2014 11:43 AM) Mammogram within last year  Allergies Jeanne Tran, CMA; 06/15/2014 11:44 AM) Codeine Phosphate *ANALGESICS - OPIOID*  Medication History Jeanne Tran, CMA; 06/15/2014 11:47 AM) Ambien (10MG  Tablet, Oral qhs) Active. Biotin (5000MCG Tablet, Oral daily) Active. ZyrTEC  Allergy (10MG  Tablet, Oral prn) Active. Bentyl (10MG  Capsule, Oral daily) Active. Vagifem (10MCG Tablet, Vaginal three times a week) Active. Lidex (0.05% Cream, External prn) Active. NexIUM (40MG  Capsule DR, Oral daily) Active. Nasacort (55MCG/ACT Inhaler, Nasal prn) Active. Multivitamin (Oral daily) Active. Zoloft (100MG  Tablet, Oral daily) Active.  Social History Jeanne Tran, [place]; 06/15/2014 11:43 AM) Caffeine use Coffee. Tobacco use Former smoker.  Family History Jeanne Tran, [name]; 06/15/2014 11:43 AM) Migraine Headache Mother.  Pregnancy / Birth History Jeanne Tran, [name]; 06/15/2014 11:43 AM) Age at menarche 56 years. Contraceptive History Oral contraceptives. Gravida 3 Maternal age 52-20 Para 3     Review of Systems (Jeanne Tran; 06/15/2014 11:43 AM) General Not Present- Appetite Loss, Chills, Fatigue, Fever, Night Sweats, Weight Gain and Weight Loss. Skin Present- Dryness. Not Present- Change in Wart/Mole, Hives, Jaundice, New Lesions, Non-Healing Wounds, Rash and Ulcer. HEENT Present- Ringing in the Ears and Wears glasses/contact lenses. Not Present- Earache, Hearing Loss, Hoarseness, Nose Bleed, Oral Ulcers, Seasonal Allergies, Sinus Pain, Sore Throat, Visual Disturbances and Yellow Eyes. Respiratory Not Present- Bloody sputum, Chronic Cough, Difficulty Breathing, Snoring and Wheezing. Cardiovascular Present- Leg Cramps. Not Present- Chest Pain, Difficulty Breathing Lying Down, Palpitations, Rapid Heart Rate, Shortness of Breath and Swelling of Extremities. Gastrointestinal Not Present- Abdominal Pain, Bloating, Bloody Stool, Change in Bowel Habits, Chronic diarrhea, Constipation, Difficulty Swallowing, Excessive gas, Gets full quickly at meals, Hemorrhoids, Indigestion, Nausea, Rectal Pain and Vomiting. Female Genitourinary Present- Frequency. Not Present- Nocturia, Painful Urination, Pelvic Pain and Urgency. Musculoskeletal Present- Back Pain and  Joint Pain. Not Present- Joint Stiffness, Muscle Pain, Muscle Weakness and Swelling of Extremities. Hematology Present- Easy Bruising. Not Present- Excessive bleeding, Gland problems, HIV and Persistent Infections.  Vitals Jeanne Tran CMA; 06/15/2014 11:48 AM) 06/15/2014 11:47 AM Weight: 157.2  lb Height: 65in Body Surface Area: 1.81 m Body Mass Index: 26.16 kg/m Temp.: 98.50F(Oral)  Pulse: 78 (Regular)  BP: 140/80 (Sitting, Left Arm, Standard)     Physical Exam Jeanne Hollingshead MD; 06/15/2014 12:13 PM)  The physical exam findings are as follows: Note:General: Elderly female in NAD. Pleasant and cooperative.  HEENT: [place]/AT, no facial masses  EYES: EOMI, no icterus  NECK: Supple, no obvious mass or thyroid enlargement.  CV: RRR, no murmur, no JVD.  CHEST: Breath sounds equal and clear. Respirations nonlabored.  BREASTS: Symmetrical in size. No dominant masses, nipple discharge or suspicious skin lesions. Left breast has an inferior scar and a small puncture wound covered by a steri strip in the UOQ.  ABDOMEN: Soft, nontender, nondistended, no masses, no organomegaly, active bowel sounds, small scars, no hernias.  MUSCULOSKELETAL: FROM, good muscle tone, no edema, no venous stasis changes  LYMPHATIC: No palpable cervical, supraclavicular, axillary adenopathy.  SKIN: No jaundice or suspicious rashes.  NEUROLOGIC: Alert and oriented, answers questions appropriately.  PSYCHIATRIC: Normal mood, affect , and behavior.    Assessment & Plan Jeanne Hollingshead MD; 06/15/2014 12:15 PM)  MALIGNANT NEOPLASM OF UPPER-OUTER QUADRANT OF LEFT FEMALE BREAST (174.4  C50.412) Impression: DCIS of left breast. She's had previous breast conservation treatment. Hormone receptor positive. She is not interested in a mastectomy. MRI of the breasts has been ordered by Jeanne Tran  Plan: Left breast lumpectomy after radioactive seed localization as long as MRI does not show any other  suspicious lesions.

## 2014-06-15 NOTE — Telephone Encounter (Signed)
The Atarax was prescribed by Dr. Annamaria Boots @ 25 mg.  She is not sleeping.  She wakes up during the night.  She would like to use this with her Ambien. Please Advise.  Thanks!

## 2014-06-15 NOTE — Telephone Encounter (Signed)
Called in Valium to the pharmacy and left on voicemail.  Left a message on patient's cell for her to return my call about Atarax.  Need to know why she was using it in the past.  Will wait on her phone call.

## 2014-06-15 NOTE — Telephone Encounter (Signed)
Valium  5 mg  Take 1-2 preprocedure   Disp 2   Please ask her about the atarax  / using it ofr anxiety sleep  itching ?   Need more information

## 2014-06-15 NOTE — Telephone Encounter (Signed)
Pt is requesting a rx for Valium.  She is having a MRI on Friday and is claustrophobic.  Her surgeon will not prescribe this.  Would also like to restart generic Atarax.   Please advise.  Thanks!

## 2014-06-15 NOTE — Telephone Encounter (Signed)
  Should not take ambien hydroxizine and valium in same day . Risk for fall and mental fogginess. Can rx 20 atarax in the short term with caution

## 2014-06-16 DIAGNOSIS — Z23 Encounter for immunization: Secondary | ICD-10-CM | POA: Diagnosis not present

## 2014-06-16 NOTE — Telephone Encounter (Signed)
Spoke to the pt.  Advised that she could not use the Atarax with the Ambien or Valium in the same day.  She wanted the Atarax to use along with Ambien.  Said she would think about what medication to use and call back when she makes a decision.  May call back tomorrow.

## 2014-06-16 NOTE — Telephone Encounter (Signed)
LM on cell for the pt to return my call.

## 2014-06-17 ENCOUNTER — Ambulatory Visit
Admission: RE | Admit: 2014-06-17 | Discharge: 2014-06-17 | Disposition: A | Discharge status: Home / Self Care | Payer: Medicare Other | Source: Ambulatory Visit | Attending: Radiology | Admitting: Radiology

## 2014-06-17 DIAGNOSIS — D0592 Unspecified type of carcinoma in situ of left breast: Secondary | ICD-10-CM | POA: Diagnosis not present

## 2014-06-17 DIAGNOSIS — D0512 Intraductal carcinoma in situ of left breast: Secondary | ICD-10-CM

## 2014-06-17 MED ORDER — GADOBENATE DIMEGLUMINE 529 MG/ML IV SOLN
15.0000 mL | Freq: Once | INTRAVENOUS | Status: AC | PRN
  Administered 2014-06-17: 14 mL via INTRAVENOUS

## 2014-06-27 ENCOUNTER — Encounter (HOSPITAL_BASED_OUTPATIENT_CLINIC_OR_DEPARTMENT_OTHER): Payer: Self-pay | Admitting: *Deleted

## 2014-06-27 NOTE — Progress Notes (Signed)
To come in for labs after seeds

## 2014-06-28 ENCOUNTER — Encounter: Payer: Self-pay | Admitting: Internal Medicine

## 2014-06-29 ENCOUNTER — Encounter (HOSPITAL_BASED_OUTPATIENT_CLINIC_OR_DEPARTMENT_OTHER)
Admission: RE | Admit: 2014-06-29 | Discharge: 2014-06-29 | Disposition: A | Discharge status: Home / Self Care | Payer: Medicare Other | Source: Ambulatory Visit | Attending: General Surgery | Admitting: General Surgery

## 2014-06-29 DIAGNOSIS — N63 Unspecified lump in breast: Secondary | ICD-10-CM | POA: Diagnosis not present

## 2014-06-29 DIAGNOSIS — C50912 Malignant neoplasm of unspecified site of left female breast: Secondary | ICD-10-CM | POA: Diagnosis not present

## 2014-06-29 DIAGNOSIS — Z79899 Other long term (current) drug therapy: Secondary | ICD-10-CM | POA: Diagnosis not present

## 2014-06-29 DIAGNOSIS — Z885 Allergy status to narcotic agent status: Secondary | ICD-10-CM | POA: Diagnosis not present

## 2014-06-29 DIAGNOSIS — Z170 Estrogen receptor positive status [ER+]: Secondary | ICD-10-CM | POA: Diagnosis not present

## 2014-06-29 DIAGNOSIS — Z87891 Personal history of nicotine dependence: Secondary | ICD-10-CM | POA: Diagnosis not present

## 2014-06-29 DIAGNOSIS — F419 Anxiety disorder, unspecified: Secondary | ICD-10-CM | POA: Diagnosis not present

## 2014-06-29 DIAGNOSIS — E780 Pure hypercholesterolemia: Secondary | ICD-10-CM | POA: Diagnosis not present

## 2014-06-29 DIAGNOSIS — K219 Gastro-esophageal reflux disease without esophagitis: Secondary | ICD-10-CM | POA: Diagnosis not present

## 2014-06-29 DIAGNOSIS — G43909 Migraine, unspecified, not intractable, without status migrainosus: Secondary | ICD-10-CM | POA: Diagnosis not present

## 2014-06-29 LAB — COMPREHENSIVE METABOLIC PANEL
ALBUMIN: 3.8 g/dL (ref 3.5–5.2)
ALT: 17 U/L (ref 0–35)
ANION GAP: 15 (ref 5–15)
AST: 24 U/L (ref 0–37)
Alkaline Phosphatase: 121 U/L — ABNORMAL HIGH (ref 39–117)
BUN: 15 mg/dL (ref 6–23)
CO2: 25 mEq/L (ref 19–32)
CREATININE: 1.08 mg/dL (ref 0.50–1.10)
Calcium: 9.6 mg/dL (ref 8.4–10.5)
Chloride: 101 mEq/L (ref 96–112)
GFR calc Af Amer: 55 mL/min — ABNORMAL LOW (ref >90)
GFR calc non Af Amer: 48 mL/min — ABNORMAL LOW (ref >90)
Glucose, Bld: 106 mg/dL — ABNORMAL HIGH (ref 70–99)
Potassium: 4.5 mEq/L (ref 3.7–5.3)
Sodium: 141 mEq/L (ref 137–147)
Total Bilirubin: 0.3 mg/dL (ref 0.3–1.2)
Total Protein: 7.5 g/dL (ref 6.0–8.3)

## 2014-06-29 LAB — CBC WITH DIFFERENTIAL/PLATELET
BASOS PCT: 0 % (ref 0–1)
Basophils Absolute: 0 10*3/uL (ref 0.0–0.1)
EOS PCT: 1 % (ref 0–5)
Eosinophils Absolute: 0.2 10*3/uL (ref 0.0–0.7)
HEMATOCRIT: 39.2 % (ref 36.0–46.0)
Hemoglobin: 13.3 g/dL (ref 12.0–15.0)
Lymphocytes Relative: 12 % (ref 12–46)
Lymphs Abs: 1.3 10*3/uL (ref 0.7–4.0)
MCH: 30.9 pg (ref 26.0–34.0)
MCHC: 33.9 g/dL (ref 30.0–36.0)
MCV: 91.2 fL (ref 78.0–100.0)
MONO ABS: 0.8 10*3/uL (ref 0.1–1.0)
Monocytes Relative: 8 % (ref 3–12)
NEUTROS ABS: 8.1 10*3/uL — AB (ref 1.7–7.7)
Neutrophils Relative %: 79 % — ABNORMAL HIGH (ref 43–77)
Platelets: 277 10*3/uL (ref 150–400)
RBC: 4.3 MIL/uL (ref 3.87–5.11)
RDW: 12.9 % (ref 11.5–15.5)
WBC: 10.4 10*3/uL (ref 4.0–10.5)

## 2014-06-29 LAB — PROTIME-INR
INR: 1 (ref 0.00–1.49)
PROTHROMBIN TIME: 13.3 s (ref 11.6–15.2)

## 2014-06-30 ENCOUNTER — Ambulatory Visit (INDEPENDENT_AMBULATORY_CARE_PROVIDER_SITE_OTHER): Payer: Medicare Other | Admitting: Family Medicine

## 2014-06-30 ENCOUNTER — Telehealth: Payer: Self-pay | Admitting: *Deleted

## 2014-06-30 ENCOUNTER — Ambulatory Visit (HOSPITAL_BASED_OUTPATIENT_CLINIC_OR_DEPARTMENT_OTHER): Payer: Medicare Other | Admitting: Cardiology

## 2014-06-30 ENCOUNTER — Encounter: Payer: Self-pay | Admitting: Family Medicine

## 2014-06-30 VITALS — BP 120/76 | HR 90 | Temp 98.4°F | Ht 64.5 in | Wt 157.5 lb

## 2014-06-30 DIAGNOSIS — M7989 Other specified soft tissue disorders: Secondary | ICD-10-CM

## 2014-06-30 DIAGNOSIS — M79661 Pain in right lower leg: Secondary | ICD-10-CM | POA: Diagnosis not present

## 2014-06-30 DIAGNOSIS — Z853 Personal history of malignant neoplasm of breast: Secondary | ICD-10-CM | POA: Diagnosis not present

## 2014-06-30 DIAGNOSIS — C50912 Malignant neoplasm of unspecified site of left female breast: Secondary | ICD-10-CM | POA: Diagnosis not present

## 2014-06-30 DIAGNOSIS — M79604 Pain in right leg: Secondary | ICD-10-CM

## 2014-06-30 NOTE — Progress Notes (Signed)
No chief complaint on file.   HPI:   Jeanne Tran is a 80 yo patient of Dr. Regis Bill here for acute visit for:  1) R leg swelling and pain -reports: redness, pain and erythema of R lower leg over the weekend now improving but still with swelling -denies: injury, lesion to skin, CP, SOB, fever, malaise, weakness, numbness, recent surgery, travel, surgery for breast cancer -of note: saw pcp 1 month ago for unilateral arm swelling and pain, labs reviewed, PCP advised follow up in 1 month and consideration of rheumatology referral  ROS: See pertinent positives and negatives per HPI.  Past Medical History  Diagnosis Date  . Hyperlipidemia   . RLS (restless legs syndrome)   . Breast cancer     hx of left with radiation and lumpectomy  . History of colonic polyps   . GERD (gastroesophageal reflux disease)   . History of ERCP     and sphincterotomy for abnormal lfts and dilated biliary ducts 5/05  . Pulmonary nodule   . Migraine headache   . Wears contact lenses     Past Surgical History  Procedure Laterality Date  . Ercp      for abnormal lfts and dilated biliary ducts 5/05  . Sphincterotomy      for abnormal lfts and dilated biliary ducts 5/05  . Lymph node biopsy    . Cystocele repair    . Appendectomy    . Hemorrhoid surgery    . Tonsillectomy    . Cervical spine surgery  3/03  . Gallbladder duct open  2006  . Cholecystectomy  2012  . Orthoscopic rt knee    . Hysteroscopy    . Complete hysterectomy    . Breast lumpectomy  1995    lt lumpsnbx  . Abdominal hysterectomy      bso    No family history on file.  History   Social History  . Marital Status: Widowed    Spouse Name: N/A    Number of Children: N/A  . Years of Education: N/A   Social History Main Topics  . Smoking status: Former Smoker -- 3.00 packs/day for 25 years    Types: Cigarettes    Quit date: 02/26/1994  . Smokeless tobacco: Never Used  . Alcohol Use: No  . Drug Use: No  . Sexual  Activity: None   Other Topics Concern  . 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                    Current outpatient prescriptions:AMBIEN 10 MG tablet, take 1 tablet by mouth at bedtime if needed, Disp: 90 tablet, Rfl: 0;  BIOTIN 5000 PO, Take 5,000 mg by mouth every other day. , Disp: , Rfl: ;  cetirizine (ZYRTEC) 10 MG tablet, Take 10 mg by mouth daily., Disp: , Rfl: ;  Cholecalciferol (VITAMIN D3) 2000 UNITS TABS, Take 1 tablet by mouth daily., Disp: , Rfl: ;  CRESTOR 5 MG tablet, take 1 tablet by mouth once daily, Disp: 90 tablet, Rfl: 1 dicyclomine (BENTYL) 10 MG capsule, Take one po BID, Disp: 180 capsule, Rfl: 0;  Estradiol (VAGIFEM) 10 MCG TABS vaginal tablet, insert 1 tablet vaginally THREE TIMES A WEEK, Disp: 36 tablet, Rfl: 3;  fish oil-omega-3 fatty acids 1000 MG capsule, Take 1 g by mouth daily. ,  Disp: , Rfl: ;  fluocinonide cream (LIDEX) 0.05 %, Apply topically 2 (two) times daily. To itchy rashas needed, not on face, Disp: 30 g, Rfl: 2 Multiple Vitamin (MULTIVITAMIN WITH MINERALS) TABS tablet, Take 1 tablet by mouth daily., Disp: , Rfl: ;  NEXIUM 40 MG capsule, take 1 capsule by mouth once daily, Disp: 90 capsule, Rfl: 1;  sertraline (ZOLOFT) 100 MG tablet, take 1 tablet by mouth once daily, Disp: 90 tablet, Rfl: 1;  triamcinolone (NASACORT) 55 MCG/ACT AERO nasal inhaler, instill 2 sprays into each nostril once daily, Disp: 49.5 mL, Rfl: 1  EXAM:  Filed Vitals:   06/30/14 1412  BP: 120/76  Pulse: 90  Temp: 98.4 F (36.9 C)    Body mass index is 26.63 kg/(m^2).  GENERAL: vitals reviewed and listed above, alert, oriented, appears well hydrated and in no acute distress  HEENT: atraumatic, conjunttiva clear, no obvious abnormalities on inspection of external nose and ears  NECK: no obvious masses on inspection  LUNGS: clear to auscultation bilaterally, no wheezes, rales or rhonchi, good air  movement  CV: HRRR, 1+ pitting edema R lower ext to mid calf, tr edema L, spider and varicose veins - no erythema, but diameter of R calf> L, NV intact distal, no knee jt swelling, no TTP of DV today.  MS: moves all extremities without noticeable abnormality  PSYCH: pleasant and cooperative, no obvious depression or anxiety  ASSESSMENT AND PLAN:  Discussed the following assessment and plan:  Pain and swelling of lower leg, right - Plan: Lower Extremity Venous Duplex Right  BREAST CANCER, HX OF  -scheduled for lumpectomy tomorrow for breast cancer -STAT venous doppler to r/o DVT  - if neg follow up with PCP to discuss other etiologies, potentially venous insufficiency given exam findings, worse on R due to prior knee surgery -if positive discussed implications, tx options, risks, return and ED precautions - will notify PCP - she has a hx of GI bleed and reports told not to take aspirin - in light of this will likely need to see vascular if positive to discuss options and for managment -surgeon office notified and they will be postponing surgery if positive -Patient advised to return or notify a doctor immediately if symptoms worsen or persist or new concerns arise.  There are no Patient Instructions on file for this visit.   Colin Benton R.

## 2014-06-30 NOTE — Progress Notes (Signed)
Bilateral lower venous duplex performed  

## 2014-06-30 NOTE — Progress Notes (Signed)
Pre visit review using our clinic review tool, if applicable. No additional management support is needed unless otherwise documented below in the visit note. 

## 2014-06-30 NOTE — Telephone Encounter (Signed)
I left a detailed message on the pts cell number stating per Dr Maudie Mercury the doppler utlrasound was negative-did not show a clot, she should follow up with Dr Regis Bill and Dr Maudie Mercury will be forwarding the results to Dr Zella Richer (general surgeon) so he is aware of this for her surgery tomorrow.

## 2014-07-01 ENCOUNTER — Encounter (HOSPITAL_BASED_OUTPATIENT_CLINIC_OR_DEPARTMENT_OTHER)
Admission: RE | Disposition: A | Discharge status: Home / Self Care | Payer: Self-pay | Source: Ambulatory Visit | Attending: General Surgery

## 2014-07-01 ENCOUNTER — Encounter (HOSPITAL_BASED_OUTPATIENT_CLINIC_OR_DEPARTMENT_OTHER): Payer: Self-pay | Admitting: Certified Registered"

## 2014-07-01 ENCOUNTER — Ambulatory Visit (HOSPITAL_BASED_OUTPATIENT_CLINIC_OR_DEPARTMENT_OTHER)
Admission: RE | Admit: 2014-07-01 | Discharge: 2014-07-01 | Disposition: A | Discharge status: Home / Self Care | Payer: Medicare Other | Source: Ambulatory Visit | Attending: General Surgery | Admitting: General Surgery

## 2014-07-01 ENCOUNTER — Encounter (HOSPITAL_BASED_OUTPATIENT_CLINIC_OR_DEPARTMENT_OTHER): Payer: Medicare Other | Admitting: Certified Registered"

## 2014-07-01 ENCOUNTER — Ambulatory Visit (HOSPITAL_BASED_OUTPATIENT_CLINIC_OR_DEPARTMENT_OTHER): Payer: Medicare Other | Admitting: Certified Registered"

## 2014-07-01 DIAGNOSIS — R921 Mammographic calcification found on diagnostic imaging of breast: Secondary | ICD-10-CM | POA: Diagnosis not present

## 2014-07-01 DIAGNOSIS — F419 Anxiety disorder, unspecified: Secondary | ICD-10-CM | POA: Diagnosis not present

## 2014-07-01 DIAGNOSIS — K219 Gastro-esophageal reflux disease without esophagitis: Secondary | ICD-10-CM | POA: Insufficient documentation

## 2014-07-01 DIAGNOSIS — G43909 Migraine, unspecified, not intractable, without status migrainosus: Secondary | ICD-10-CM | POA: Insufficient documentation

## 2014-07-01 DIAGNOSIS — Z87891 Personal history of nicotine dependence: Secondary | ICD-10-CM | POA: Insufficient documentation

## 2014-07-01 DIAGNOSIS — C50912 Malignant neoplasm of unspecified site of left female breast: Secondary | ICD-10-CM | POA: Insufficient documentation

## 2014-07-01 DIAGNOSIS — N6012 Diffuse cystic mastopathy of left breast: Secondary | ICD-10-CM | POA: Diagnosis not present

## 2014-07-01 DIAGNOSIS — D0512 Intraductal carcinoma in situ of left breast: Secondary | ICD-10-CM | POA: Diagnosis not present

## 2014-07-01 DIAGNOSIS — Z79899 Other long term (current) drug therapy: Secondary | ICD-10-CM | POA: Insufficient documentation

## 2014-07-01 DIAGNOSIS — Z170 Estrogen receptor positive status [ER+]: Secondary | ICD-10-CM | POA: Insufficient documentation

## 2014-07-01 DIAGNOSIS — E780 Pure hypercholesterolemia: Secondary | ICD-10-CM | POA: Insufficient documentation

## 2014-07-01 DIAGNOSIS — Z885 Allergy status to narcotic agent status: Secondary | ICD-10-CM | POA: Insufficient documentation

## 2014-07-01 HISTORY — PX: BREAST LUMPECTOMY WITH RADIOACTIVE SEED LOCALIZATION: SHX6424

## 2014-07-01 HISTORY — DX: Presence of spectacles and contact lenses: Z97.3

## 2014-07-01 SURGERY — BREAST LUMPECTOMY WITH RADIOACTIVE SEED LOCALIZATION
Anesthesia: General | Site: Breast | Laterality: Left

## 2014-07-01 MED ORDER — OXYCODONE HCL 5 MG PO TABS: 5.0000 mg | ORAL_TABLET | Freq: Once | ORAL | Status: DC | PRN

## 2014-07-01 MED ORDER — LIDOCAINE HCL (CARDIAC) 20 MG/ML IV SOLN
INTRAVENOUS | Status: DC | PRN
  Administered 2014-07-01: 30 mg via INTRAVENOUS

## 2014-07-01 MED ORDER — FENTANYL CITRATE 0.05 MG/ML IJ SOLN: 50.0000 ug | INTRAMUSCULAR | Status: DC | PRN

## 2014-07-01 MED ORDER — FENTANYL CITRATE 0.05 MG/ML IJ SOLN: 25.0000 ug | INTRAMUSCULAR | Status: DC | PRN

## 2014-07-01 MED ORDER — PROMETHAZINE HCL 25 MG/ML IJ SOLN: 6.2500 mg | INTRAMUSCULAR | Status: DC | PRN

## 2014-07-01 MED ORDER — BUPIVACAINE HCL (PF) 0.5 % IJ SOLN
INTRAMUSCULAR | Status: DC | PRN
  Administered 2014-07-01: 18 mL

## 2014-07-01 MED ORDER — CEFAZOLIN SODIUM-DEXTROSE 2-3 GM-% IV SOLR
2.0000 g | INTRAVENOUS | Status: AC
  Administered 2014-07-01: 2 g via INTRAVENOUS

## 2014-07-01 MED ORDER — LACTATED RINGERS IV SOLN
INTRAVENOUS | Status: DC
  Administered 2014-07-01 (×2): via INTRAVENOUS

## 2014-07-01 MED ORDER — BUPIVACAINE HCL (PF) 0.5 % IJ SOLN
INTRAMUSCULAR | Status: AC
Start: 2014-07-01 — End: 2014-07-01
  Filled 2014-07-01: qty 30

## 2014-07-01 MED ORDER — PROPOFOL 10 MG/ML IV BOLUS
INTRAVENOUS | Status: DC | PRN
  Administered 2014-07-01: 100 mg via INTRAVENOUS

## 2014-07-01 MED ORDER — MIDAZOLAM HCL 2 MG/2ML IJ SOLN: 1.0000 mg | INTRAMUSCULAR | Status: DC | PRN

## 2014-07-01 MED ORDER — TRAMADOL HCL 50 MG PO TABS: 50.0000 mg | ORAL_TABLET | Freq: Four times a day (QID) | ORAL | Status: DC | PRN

## 2014-07-01 MED ORDER — OXYCODONE HCL 5 MG/5ML PO SOLN: 5.0000 mg | Freq: Once | ORAL | Status: DC | PRN

## 2014-07-01 MED ORDER — DEXAMETHASONE SODIUM PHOSPHATE 4 MG/ML IJ SOLN
INTRAMUSCULAR | Status: DC | PRN
  Administered 2014-07-01: 8 mg via INTRAVENOUS

## 2014-07-01 MED ORDER — 0.9 % SODIUM CHLORIDE (POUR BTL) OPTIME
TOPICAL | Status: DC | PRN
  Administered 2014-07-01: 200 mL

## 2014-07-01 MED ORDER — CEFAZOLIN SODIUM-DEXTROSE 2-3 GM-% IV SOLR
INTRAVENOUS | Status: AC
  Filled 2014-07-01: qty 50

## 2014-07-01 MED ORDER — FENTANYL CITRATE 0.05 MG/ML IJ SOLN
INTRAMUSCULAR | Status: AC
  Filled 2014-07-01: qty 6

## 2014-07-01 MED ORDER — FENTANYL CITRATE 0.05 MG/ML IJ SOLN
INTRAMUSCULAR | Status: DC | PRN
  Administered 2014-07-01 (×2): 25 ug via INTRAVENOUS
  Administered 2014-07-01: 50 ug via INTRAVENOUS

## 2014-07-01 MED ORDER — ONDANSETRON HCL 4 MG/2ML IJ SOLN
INTRAMUSCULAR | Status: DC | PRN
  Administered 2014-07-01: 4 mg via INTRAVENOUS

## 2014-07-01 SURGICAL SUPPLY — 47 items
APPLIER CLIP 9.375 MED OPEN (MISCELLANEOUS)
BENZOIN TINCTURE PRP APPL 2/3 (GAUZE/BANDAGES/DRESSINGS) ×3 IMPLANT
BINDER BREAST MEDIUM (GAUZE/BANDAGES/DRESSINGS) ×3 IMPLANT
BLADE SURG 15 STRL LF DISP TIS (BLADE) ×1 IMPLANT
BLADE SURG 15 STRL SS (BLADE) ×2
CANISTER SUC SOCK COL 7IN (MISCELLANEOUS) IMPLANT
CANISTER SUCT 1200ML W/VALVE (MISCELLANEOUS) IMPLANT
CHLORAPREP W/TINT 26ML (MISCELLANEOUS) ×3 IMPLANT
CLIP APPLIE 9.375 MED OPEN (MISCELLANEOUS) IMPLANT
CLOSURE WOUND 1/2 X4 (GAUZE/BANDAGES/DRESSINGS) ×1
COVER BACK TABLE 60X90IN (DRAPES) ×3 IMPLANT
COVER MAYO STAND STRL (DRAPES) ×3 IMPLANT
COVER PROBE W GEL 5X96 (DRAPES) ×3 IMPLANT
DECANTER SPIKE VIAL GLASS SM (MISCELLANEOUS) IMPLANT
DEVICE DUBIN W/COMP PLATE 8390 (MISCELLANEOUS) IMPLANT
DRAPE PED LAPAROTOMY (DRAPES) ×3 IMPLANT
DRAPE UTILITY XL STRL (DRAPES) ×3 IMPLANT
ELECT COATED BLADE 2.86 ST (ELECTRODE) ×3 IMPLANT
ELECT REM PT RETURN 9FT ADLT (ELECTROSURGICAL) ×3
ELECTRODE REM PT RTRN 9FT ADLT (ELECTROSURGICAL) ×1 IMPLANT
GAUZE SPONGE 4X4 12PLY STRL (GAUZE/BANDAGES/DRESSINGS) ×6 IMPLANT
GLOVE BIOGEL PI IND STRL 8.5 (GLOVE) ×2 IMPLANT
GLOVE BIOGEL PI INDICATOR 8.5 (GLOVE) ×4
GLOVE ECLIPSE 8.0 STRL XLNG CF (GLOVE) ×3 IMPLANT
GLOVE SURG SS PI 8.5 STRL IVOR (GLOVE) ×2
GLOVE SURG SS PI 8.5 STRL STRW (GLOVE) ×1 IMPLANT
GOWN STRL REUS W/ TWL LRG LVL3 (GOWN DISPOSABLE) ×2 IMPLANT
GOWN STRL REUS W/ TWL XL LVL3 (GOWN DISPOSABLE) ×1 IMPLANT
GOWN STRL REUS W/TWL LRG LVL3 (GOWN DISPOSABLE) ×4
GOWN STRL REUS W/TWL XL LVL3 (GOWN DISPOSABLE) ×2
NEEDLE HYPO 25X1 1.5 SAFETY (NEEDLE) ×3 IMPLANT
NS IRRIG 1000ML POUR BTL (IV SOLUTION) ×3 IMPLANT
PACK BASIN DAY SURGERY FS (CUSTOM PROCEDURE TRAY) ×3 IMPLANT
PENCIL BUTTON HOLSTER BLD 10FT (ELECTRODE) ×3 IMPLANT
SLEEVE SCD COMPRESS KNEE MED (MISCELLANEOUS) IMPLANT
SPONGE GAUZE 4X4 12PLY STER LF (GAUZE/BANDAGES/DRESSINGS) ×3 IMPLANT
SPONGE LAP 4X18 X RAY DECT (DISPOSABLE) ×3 IMPLANT
STRIP CLOSURE SKIN 1/2X4 (GAUZE/BANDAGES/DRESSINGS) ×2 IMPLANT
SUT MON AB 4-0 PC3 18 (SUTURE) ×3 IMPLANT
SUT SILK 2 0 FS (SUTURE) ×6 IMPLANT
SUT VICRYL 3-0 CR8 SH (SUTURE) ×3 IMPLANT
SYR CONTROL 10ML LL (SYRINGE) ×3 IMPLANT
TOWEL OR 17X24 6PK STRL BLUE (TOWEL DISPOSABLE) ×6 IMPLANT
TOWEL OR NON WOVEN STRL DISP B (DISPOSABLE) ×3 IMPLANT
TUBE CONNECTING 20'X1/4 (TUBING)
TUBE CONNECTING 20X1/4 (TUBING) IMPLANT
YANKAUER SUCT BULB TIP NO VENT (SUCTIONS) IMPLANT

## 2014-07-01 NOTE — Transfer of Care (Signed)
Immediate Anesthesia Transfer of Care Note  Patient: Jeanne Tran  Procedure(s) Performed: Procedure(s): LEFT BREAST LUMPECTOMY WITH RADIOACTIVE SEED LOCALIZATION (Left)  Patient Location: PACU  Anesthesia Type:General  Level of Consciousness: awake and patient cooperative  Airway & Oxygen Therapy: Patient Spontanous Breathing and Patient connected to face mask oxygen  Post-op Assessment: Report given to PACU RN and Post -op Vital signs reviewed and stable  Post vital signs: Reviewed and stable  Complications: No apparent anesthesia complications

## 2014-07-01 NOTE — Anesthesia Postprocedure Evaluation (Signed)
  Anesthesia Post-op Note  Patient: Jeanne Tran  Procedure(s) Performed: Procedure(s): LEFT BREAST LUMPECTOMY WITH RADIOACTIVE SEED LOCALIZATION (Left)  Patient Location: PACU  Anesthesia Type:General  Level of Consciousness: awake, alert  and oriented  Airway and Oxygen Therapy: Patient Spontanous Breathing  Post-op Pain: none  Post-op Assessment: Post-op Vital signs reviewed  Post-op Vital Signs: Reviewed  Last Vitals:  Filed Vitals:   07/01/14 1330  BP: 123/69  Pulse: 75  Temp: 36.7 C  Resp: 18    Complications: No apparent anesthesia complications

## 2014-07-01 NOTE — Op Note (Signed)
Operative Note  Jeanne Tran female 80 y.o. 07/01/2014  PREOPERATIVE DX:  DCIS left breast  POSTOPERATIVE DX:  Same  PROCEDURE:   Left partial mastectomy after radioactive seed localization         Surgeon: [name]   Assistants: none  Anesthesia: General LMA anesthesia  Indications:   This is a 80 year old female who had left breast cancer approximately 20 years ago. She was treated with lumpectomy, axillary lymph node dissection, radiation, and tamoxifen. She had a recent mammogram which demonstrated a new mass in the 12 to 1:00 positions. Biopsy was positive for ductal carcinoma in situ. She now presents for the above procedure.    Procedure Detail:  She had placement of the radioactive seed earlier this week. She was seen in the holding area. Using the neoprobe, the area of the seed was identified. The left breast was marked with my initials.  She was brought to the operating room placed supine on the operating table and a general anesthetic was given. The left breast was sterilely prepped and draped. Using the neoprobe, the area of the seed was identified at approximately the 1:00 position. Local anesthetic consisting of 1/2% plain Marcaine was infiltrated in the area.  A curvilinear incision was made in the upper-outer quadrant through the skin and subcutaneous tissue. Flaps were raised in all directions. Using the neoprobe, the partial mastectomy was performed with what appeared to be grossly negative margins in all directions. The anterior aspect of the specimen was marked with a single suture, the medial part of the specimen was marked with a double suture. A specimen mammogram was performed which demonstrated the lesion and the radioactive seeds to be within the specimen. This was confirmed by the radiologist. The specimen was sent to pathology. Pathology called and reported that he had retrieved the seed.  Shave margins were then performed in the superior,  lateral, inferior, and medial and areas. These were oriented with a single suture anteriorly and a double suture medially. They were sent to pathology.  The wound was inspected. Bleeding was controlled with electrocautery. The wound is irrigated. Hemostasis was adequate.  The subcutaneous tissues closed with interrupted 3-0 Vicryl sutures. The skin was closed with 4-0 Monocryl subcuticular stitch. Steri-Strips and a sterile dressing were applied. A breast binder was applied.  She tolerated the procedure well apparent complications and was taken to the recovery room in satisfactory condition.  Estimated Blood Loss:  100 ml         Drains: none          Specimens: Left breast tissue        Complications:  * No complications entered in OR log *         Disposition: PACU - hemodynamically stable.         Condition: stable

## 2014-07-01 NOTE — Discharge Instructions (Addendum)
[place] [facility] Phone Number [phone]  BREAST BIOPSY/ PARTIAL MASTECTOMY: POST OP INSTRUCTIONS  Always review your discharge instruction sheet given to you by the facility where your surgery was performed.  IF YOU HAVE DISABILITY OR FAMILY LEAVE FORMS, YOU MUST BRING THEM TO THE OFFICE FOR PROCESSING.  DO NOT GIVE THEM TO YOUR DOCTOR.  1. A prescription for pain medication may be given to you upon discharge.  Take your pain medication as prescribed, if needed.  If narcotic pain medicine is not needed, then you may take acetaminophen (Tylenol) or ibuprofen (Advil) as needed. 2. Take your usually prescribed medications unless otherwise directed 3. If you need a refill on your pain medication, please contact your pharmacy.  They will contact our office to request authorization.  Prescriptions will not be filled after 5pm or on week-ends. 4. You should eat very light the first 24 hours after surgery, such as soup, crackers, pudding, etc.  Resume your normal diet the day after surgery. 5. Most patients will experience some swelling and bruising in the breast.  Ice packs and a good support bra will help.  Swelling and bruising can take several days to resolve.  6. It is common to experience some constipation if taking pain medication after surgery.  Increasing fluid intake and taking a stool softener will usually help or prevent this problem from occurring.  A mild laxative (Milk of Magnesia or Miralax) should be taken according to package directions if there are no bowel movements after 48 hours. 7. Unless discharge instructions indicate otherwise, you may remove your binder and bandages 48 hours after surgery, and you may shower at that time.  You may have steri-strips (small skin tapes) in place directly over the incision.  These strips should be left on the skin.  If your surgeon used skin glue on the incision, you may shower in 24 hours.  The glue will flake off over the next 2-3  weeks.  Any sutures or staples will be removed at the office during your follow-up visit. 8. ACTIVITIES:  Light activities for 5-7 days or until you are pain-free.  Wearing a good support bra or sports bra minimizes pain and swelling.  You may have sexual intercourse when it is comfortable. a. You may drive when you no longer are taking prescription pain medication, you can comfortably wear a seatbelt, and you can safely maneuver your car and apply brakes. b. RETURN TO WORK:  ______________________________________________________________________________________ 9. You should see your doctor in the office for a follow-up appointment approximately two weeks after your surgery.   Please call to make this appointment. 10.  OTHER INSTRUCTIONS: _______________________________________________________________________________________________ _____________________________________________________________________________________________________________________________________ _____________________________________________________________________________________________________________________________________ _____________________________________________________________________________________________________________________________________  WHEN TO CALL YOUR DOCTOR: 1. Fever over 101.0 2. Nausea and/or vomiting. 3. Extreme swelling or bruising. 4. Continued bleeding from incision. 5. Increased pain, redness, or drainage from the incision.  The clinic staff is available to answer your questions during regular business hours.  Please dont hesitate to call and ask to speak to one of the nurses for clinical concerns.  If you have a medical emergency, go to the nearest emergency room or call 911.  A surgeon from [facility] is always on call at the hospital.  For further questions, please visit [email or web address]    Post Anesthesia Home Care Instructions  Activity: Get plenty of rest for  the remainder of the day. A responsible adult should stay with you for 24 hours following the procedure.  For the next 24 hours, DO  NOT: -Drive a car Film/video editor -Drink alcoholic beverages -Take any medication unless instructed by your physician -Make any legal decisions or sign important papers.  Meals: Start with liquid foods such as gelatin or soup. Progress to regular foods as tolerated. Avoid greasy, spicy, heavy foods. If nausea and/or vomiting occur, drink only clear liquids until the nausea and/or vomiting subsides. Call your physician if vomiting continues.  Special Instructions/Symptoms: Your throat may feel dry or sore from the anesthesia or the breathing tube placed in your throat during surgery. If this causes discomfort, gargle with warm salt water. The discomfort should disappear within 24 hours.

## 2014-07-01 NOTE — Anesthesia Preprocedure Evaluation (Addendum)
Anesthesia Evaluation  Patient identified by MRN, date of birth, ID band Patient awake    Reviewed: Allergy & Precautions, H&P , NPO status , Patient's Chart, lab work & pertinent test results  Airway Mallampati: II TM Distance: >3 FB Neck ROM: Full    Dental  (+) Dental Advisory Given   Pulmonary former smoker,  breath sounds clear to auscultation        Cardiovascular negative cardio ROS  Rhythm:Regular Rate:Normal     Neuro/Psych  Headaches,    GI/Hepatic Neg liver ROS, GERD-  ,  Endo/Other  negative endocrine ROS  Renal/GU negative Renal ROS     Musculoskeletal  (+) Arthritis -,   Abdominal   Peds  Hematology negative hematology ROS (+)   Anesthesia Other Findings   Reproductive/Obstetrics                          Anesthesia Physical Anesthesia Plan  ASA: II  Anesthesia Plan: General   Post-op Pain Management:    Induction: Intravenous  Airway Management Planned: LMA  Additional Equipment:   Intra-op Plan:   Post-operative Plan:   Informed Consent: I have reviewed the patients History and Physical, chart, labs and discussed the procedure including the risks, benefits and alternatives for the proposed anesthesia with the patient or authorized representative who has indicated his/her understanding and acceptance.   Dental advisory given  Plan Discussed with: CRNA and Surgeon  Anesthesia Plan Comments:         Anesthesia Quick Evaluation

## 2014-07-01 NOTE — Anesthesia Procedure Notes (Signed)
Procedure Name: LMA Insertion Date/Time: 07/01/2014 11:19 AM Performed by: Kamrin Spath Pre-anesthesia Checklist: Patient identified, Emergency Drugs available, Suction available and Patient being monitored Patient Re-evaluated:Patient Re-evaluated prior to inductionOxygen Delivery Method: Circle System Utilized Preoxygenation: Pre-oxygenation with 100% oxygen Intubation Type: IV induction Ventilation: Mask ventilation without difficulty LMA: LMA inserted LMA Size: 4.0 Number of attempts: 1 Airway Equipment and Method: bite block Placement Confirmation: positive ETCO2 Tube secured with: Tape Dental Injury: Teeth and Oropharynx as per pre-operative assessment

## 2014-07-01 NOTE — H&P (Signed)
The patient is a 80 year old female who presents with breast cancer.  Note: She is referred by Dr. Marcelo Baldy because of a new diagnosis of left breast DCIS. She has a previous history of left breast cancer and was treated with lumpectomy, axillary lymph node dissection, radiation, and tamoxifen 20 years ago. She was noted to have a new mass in the left breast on recent mammography at the 12 to 1:00 position, middle depth. Image guided biopsy was performed with the above pathology. The tumor is ER and PR positive. No family history of breast cancer. No early menarche. No late menopause. First child was born in before the age of 65. No palpable breast mass. An MRI has been ordered for later this week.  Other Problems [name], CMA; 06/15/2014 11:43 AM)  Anxiety Disorder  Arthritis  Breast Cancer  Gastric Ulcer  Gastroesophageal Reflux Disease  Hemorrhoids  Hypercholesterolemia  Melanoma  Migraine Headache  Oophorectomy  Past Surgical History [name], CMA; 06/15/2014 11:43 AM)  Appendectomy  Breast Biopsy Left.  Breast Mass; Local Excision Left.  Gallbladder Surgery - Laparoscopic  Hemorrhoidectomy  Hysterectomy (not due to cancer) - Complete  Knee Surgery Left.  Oral Surgery  Spinal Surgery - Neck  Tonsillectomy  Diagnostic Studies History [name], [name]; 06/15/2014 11:43 AM)  Mammogram within last year  Allergies [name], CMA; 06/15/2014 11:44 AM)  Codeine Phosphate *ANALGESICS - OPIOID*  Medication History [name], CMA; 06/15/2014 11:47 AM)  Ambien (10MG  Tablet, Oral qhs) Active.  Biotin (5000MCG Tablet, Oral daily) Active.  ZyrTEC Allergy (10MG  Tablet, Oral prn) Active.  Bentyl (10MG  Capsule, Oral daily) Active.  Vagifem (10MCG Tablet, Vaginal three times a week) Active.  Lidex (0.05% Cream, External prn) Active.  NexIUM (40MG  Capsule DR, Oral daily) Active.  Nasacort (55MCG/ACT Inhaler, Nasal prn) Active.  Multivitamin (Oral daily) Active.  Zoloft  (100MG  Tablet, Oral daily) Active.  Social History [name], [place]; 06/15/2014 11:43 AM)  Caffeine use Coffee.  Tobacco use Former smoker.  Family History [name], [name]; 06/15/2014 11:43 AM)  Migraine Headache Mother.  Pregnancy / Birth History [name], [name]; 06/15/2014 11:43 AM)  Age at menarche 89 years.  Contraceptive History Oral contraceptives.  Gravida 3  Maternal age 74-20  Para 3  Review of Systems ([place]; 06/15/2014 11:43 AM)  General Not Present- Appetite Loss, Chills, Fatigue, Fever, Night Sweats, Weight Gain and Weight Loss.  Skin Present- Dryness. Not Present- Change in Wart/Mole, Hives, Jaundice, New Lesions, Non-Healing Wounds, Rash and Ulcer.  HEENT Present- Ringing in the Ears and Wears glasses/contact lenses. Not Present- Earache, Hearing Loss, Hoarseness, Nose Bleed, Oral Ulcers, Seasonal Allergies, Sinus Pain, Sore Throat, Visual Disturbances and Yellow Eyes.  Respiratory Not Present- Bloody sputum, Chronic Cough, Difficulty Breathing, Snoring and Wheezing.  Cardiovascular Present- Leg Cramps. Not Present- Chest Pain, Difficulty Breathing Lying Down, Palpitations, Rapid Heart Rate, Shortness of Breath and Swelling of Extremities.  Gastrointestinal Not Present- Abdominal Pain, Bloating, Bloody Stool, Change in Bowel Habits, Chronic diarrhea, Constipation, Difficulty Swallowing, Excessive gas, Gets full quickly at meals, Hemorrhoids, Indigestion, Nausea, Rectal Pain and Vomiting.  Female Genitourinary Present- Frequency. Not Present- Nocturia, Painful Urination, Pelvic Pain and Urgency.  Musculoskeletal Present- Back Pain and Joint Pain. Not Present- Joint Stiffness, Muscle Pain, Muscle Weakness and Swelling of Extremities.  Hematology Present- Easy Bruising. Not Present- Excessive bleeding, Gland problems, HIV and Persistent Infections.  Vitals [name] CMA; 06/15/2014 11:48 AM)  06/15/2014 11:47 AM  Weight: 157.2 lb Height:  65 in  Body Surface  Area: 1.81 m Body Mass Index: 26.16 kg/m  Temp.: 98.4 F (Oral) Pulse: 78 (Regular)  BP: 140/80 (Sitting, Left Arm, Standard)  Physical Exam [name] MD; 06/15/2014 12:13 PM)  The physical exam findings are as follows:  Note: General: Elderly female in NAD. Pleasant and cooperative.  HEENT: [place]/AT, no facial masses  EYES: EOMI, no icterus  NECK: Supple, no obvious mass or thyroid enlargement.  CV: RRR, no murmur, no JVD.  CHEST: Breath sounds equal and clear. Respirations nonlabored.  BREASTS: Symmetrical in size. No dominant masses, nipple discharge or suspicious skin lesions. Left breast has an inferior scar and a small puncture wound covered by a steri strip in the UOQ.  ABDOMEN: Soft, nontender, nondistended, no masses, no organomegaly, active bowel sounds, small scars, no hernias.  MUSCULOSKELETAL: FROM, good muscle tone, no edema, no venous stasis changes  LYMPHATIC: No palpable cervical, supraclavicular, axillary adenopathy.  SKIN: No jaundice or suspicious rashes.  NEUROLOGIC: Alert and oriented, answers questions appropriately.  PSYCHIATRIC: Normal mood, affect , and behavior.  Assessment & Plan [name] MD; 06/15/2014 12:15 PM)  MALIGNANT NEOPLASM OF UPPER-OUTER QUADRANT OF LEFT FEMALE BREAST (174.4  C50.412)  Impression: DCIS of left breast. She's had previous breast conservation treatment. Hormone receptor positive. She is not interested in a mastectomy. MRI of the breasts did not show any other lesions. Plan: Left breast lumpectomy after radioactive seed localization.  Procedure and risks have been discussed with her.

## 2014-07-04 ENCOUNTER — Encounter (HOSPITAL_BASED_OUTPATIENT_CLINIC_OR_DEPARTMENT_OTHER): Payer: Self-pay | Admitting: General Surgery

## 2014-07-07 ENCOUNTER — Telehealth: Payer: Self-pay | Admitting: Family Medicine

## 2014-07-07 NOTE — Telephone Encounter (Signed)
Pt would like to change to name brand Celebrex.  90 day supply.  Needs to go to [facility].  Also is requesting a medication for sleep.  States she is only sleeping 4 hours per night.  OV?

## 2014-07-08 ENCOUNTER — Telehealth: Payer: Self-pay | Admitting: Internal Medicine

## 2014-07-08 NOTE — Telephone Encounter (Signed)
Spoke with patient and she states that she is "recovering from breast cancer surgery and will call back as needed".

## 2014-07-08 NOTE — Telephone Encounter (Signed)
Need to ask reason for brand because her insurance company  Usually do not approve brand  without a documented reason.  There is no good answer about the sleep. 1. Discuss with the oncologist for ideas  2. OV to discuss other options  For sleep

## 2014-07-08 NOTE — Telephone Encounter (Signed)
[id number] [address], [place]. Is requesting re-fill on AMBIEN 10 MG tablet

## 2014-07-09 ENCOUNTER — Other Ambulatory Visit: Payer: Self-pay | Admitting: Internal Medicine

## 2014-07-11 NOTE — Telephone Encounter (Signed)
Sent to the pharmacy by e-scribe.  Sent a letter to the pt to schedule her yearly wellness exam for March 2016.

## 2014-07-12 ENCOUNTER — Other Ambulatory Visit: Payer: Self-pay | Admitting: Family Medicine

## 2014-07-12 MED ORDER — TRIAMCINOLONE ACETONIDE 55 MCG/ACT NA AERO: INHALATION_SPRAY | NASAL | Status: DC

## 2014-07-12 NOTE — Telephone Encounter (Signed)
Pt called to request a 90 day supply of triamcinolone (NASACORT) 55 MCG/ACT AERO nasal inhaler.  Sent to the pharmacy by e-scribe.

## 2014-07-13 ENCOUNTER — Encounter: Payer: Self-pay | Admitting: Internal Medicine

## 2014-07-13 MED ORDER — ZOLPIDEM TARTRATE 5 MG PO TABS: 5.0000 mg | ORAL_TABLET | Freq: Every evening | ORAL | Status: DC | PRN

## 2014-07-13 MED ORDER — NASACORT AQ 55 MCG/ACT NA AERO: 2.0000 | INHALATION_SPRAY | Freq: Every day | NASAL | Status: DC

## 2014-07-13 NOTE — Telephone Encounter (Signed)
Can  Do new rx ambien 5 mg  "1 po if needed at night for sleep:  Avoid regular use ." Disp 30 refill x 1

## 2014-07-13 NOTE — Telephone Encounter (Signed)
Ambien 5 mg called to the pharmacy.  Pt also left a message on my machine that requested the name brand Nasacort to be sent to the pharmacy.  Currently out of generic.

## 2014-07-21 ENCOUNTER — Telehealth: Payer: Self-pay | Admitting: Family Medicine

## 2014-07-21 NOTE — Telephone Encounter (Signed)
Pt states she was only off the 10 mg for a few days because she had some at home.  Has been taking the 5 mg and it is not working.  Would like to switch back to the 10 mg.  Please advise.  Thanks!

## 2014-07-21 NOTE — Telephone Encounter (Signed)
Because she was off med for a while and the fda and safely  recommendations for age and gender  are to use  5 mg  When needed.  I suggest try 5 mg and then can add another 5 if needed .  And avoid regular use.

## 2014-07-21 NOTE — Telephone Encounter (Signed)
Can rx 10 mg ambien disp 30  ROV before  Runs out.

## 2014-07-21 NOTE — Telephone Encounter (Signed)
Pt would like to know why you have decreased the strength of her Ambien from 10 mg to 5 mg.  Please advise.  Thanks!

## 2014-07-22 MED ORDER — ZOLPIDEM TARTRATE 10 MG PO TABS: 10.0000 mg | ORAL_TABLET | Freq: Every evening | ORAL | Status: DC | PRN

## 2014-07-22 NOTE — Telephone Encounter (Signed)
Pt notified to pick up rx at the pharmacy and to make an appt before runs out.

## 2014-07-22 NOTE — Addendum Note (Signed)
Addended by: Miles Costain T on: 07/22/2014 04:49 PM   Modules accepted: Orders

## 2014-07-25 ENCOUNTER — Telehealth: Payer: Self-pay | Admitting: *Deleted

## 2014-07-25 NOTE — Telephone Encounter (Signed)
Received ok to schedule for Dr. Burr Medico. Called and left a message for the pt to return my call so I can schedule her.

## 2014-07-27 ENCOUNTER — Other Ambulatory Visit: Payer: Self-pay | Admitting: Family Medicine

## 2014-07-27 MED ORDER — AMBIEN 10 MG PO TABS: 10.0000 mg | ORAL_TABLET | Freq: Every evening | ORAL | Status: DC | PRN

## 2014-07-27 NOTE — Telephone Encounter (Signed)
Patient called and left a voice message on my machine.  Stated she wanted only name brand Ambien.  Called to the pharmacy and left on voicemail.

## 2014-07-29 ENCOUNTER — Telehealth: Payer: Self-pay | Admitting: *Deleted

## 2014-07-29 NOTE — Telephone Encounter (Signed)
Left a message for pt to return my call so I can schedule a med onc appt.

## 2014-08-03 ENCOUNTER — Telehealth: Payer: Self-pay | Admitting: *Deleted

## 2014-08-03 NOTE — Telephone Encounter (Signed)
Pt returned my call and I confirmed 08/08/14 appt w/ her.  Unable to mail before appt letter - gave verbal.  Unable to mail welcoming packet - gave directions and instructions.  Unable to mail intake form - placed a note for one to be given at time of check in.  Emailed Engineer, civil (consulting) at [employer] to make her aware.  Added to spreadsheet. All records are in [facility].

## 2014-08-05 ENCOUNTER — Other Ambulatory Visit: Payer: Self-pay | Admitting: Internal Medicine

## 2014-08-05 NOTE — Telephone Encounter (Signed)
Sent to the pharmacy by e-scribe. 

## 2014-08-05 NOTE — Telephone Encounter (Signed)
Ok to refill x 2  

## 2014-08-07 NOTE — Progress Notes (Signed)
[facility] NOTE  Patient Care Team: Burnis Medin, MD as PCP - General Lafayette Dragon, MD (Gastroenterology) Dyke Maes, OD (Optometry) Elaina Hoops, MD (Neurosurgery) [name], MD as Consulting Physician (General Surgery) Truitt Merle, MD as Consulting Physician (Hematology)  Oncology History  1.  History of left breast cancer   Diagnosed in 1995, s/p lumpectomy and 5 years Tamoxifen. Records not available     2.  Left breast DCIS  Diagnosed  On 06/08/2014      CHIEF COMPLAINTS/PURPOSE OF CONSULTATION:  Newly diagnosed left breast DCIS   HISTORY OF PRESENTING ILLNESS:  Jeanne Tran 80 y.o. female is here because of recent diagnosis of left breast DCIS.   She is referred by Dr. Zella Richer because of a new diagnosis of left breast DCIS. She has a previous history of left breast cancer in 1995 and was treated with lumpectomy, axillary lymph node dissection, radiation, and 7 years tamoxifen. She has been followed by yearly mammogram. She was noted to have a new mass in the left breast on recent mammography at the 12 to 1:00 position, middle depth. Image guided biopsy was performed with the above pathology. The tumor is ER and PR positive.   She was seen by Dr. Zella Richer. She declined mastectomy, underwent lumpectomy on 07/01/2014. The surgery went well without any complications. She has recovered well from the surgery, except mild fatigue after the surgery. She is able to function well at home. She denies any pain, dyspnea, abdominal discomfort, or any other symptoms.   SUMMARY OF ONCOLOGIC HISTORY: Oncology History   Diagnosed in 1995, s/p lumpectomy and 5 years Tamoxifen. Records not available      History of left breast cancer    Initial Diagnosis History of left breast cancer    In terms of breast cancer risk profile:  She menarched at early age of 89 and went to menopause at age mind 27's.   She had 3 pregnancy, her first child was born at  age 36.   She did not breast-fed her child.  She did received birth control pills for several years.  She was on hormone replacement therapy for 12-13 years.  She has no family history of Breast/GYN/GI cancer    MEDICAL HISTORY:  Past Medical History  Diagnosis Date  . Hyperlipidemia   . RLS (restless legs syndrome)   . Breast cancer     hx of left with radiation and lumpectomy  . History of colonic polyps   . GERD (gastroesophageal reflux disease)   . History of ERCP     and sphincterotomy for abnormal lfts and dilated biliary ducts 5/05  . Pulmonary nodule   . Migraine headache   . Wears contact lenses     SURGICAL HISTORY: Past Surgical History  Procedure Laterality Date  . Ercp      for abnormal lfts and dilated biliary ducts 5/05  . Sphincterotomy      for abnormal lfts and dilated biliary ducts 5/05  . Lymph node biopsy    . Cystocele repair    . Appendectomy    . Hemorrhoid surgery    . Tonsillectomy    . Cervical spine surgery  3/03  . Gallbladder duct open  2006  . Cholecystectomy  2012  . Orthoscopic rt knee    . Hysteroscopy    . Complete hysterectomy    . Breast lumpectomy  1995    lt lumpsnbx  . Abdominal hysterectomy  bso  . Breast lumpectomy with radioactive seed localization Left 07/01/2014    Procedure: LEFT BREAST LUMPECTOMY WITH RADIOACTIVE SEED LOCALIZATION;  Surgeon: [name], MD;  Location: [facility];  Service: General;  Laterality: Left;    SOCIAL HISTORY: History   Social History  . Marital Status: Widowed    Spouse Name: N/A    Number of Children: N/A  . Years of Education: N/A  She lives alone at home.   Occupational History  . Not on file.   Social History Main Topics  . Smoking status: Former Smoker -- 3.00 packs/day for 25 years    Types: Cigarettes    Quit date: 02/26/1994  . Smokeless tobacco: Never Used  . Alcohol Use: No  . Drug Use: No  . Sexual Activity: Not on file          Social  History Narrative   Retired   Single widowed    International aid/development worker   HH of 1   No   No tad and bridge.                    FAMILY HISTORY: No family history of malignancy.  ALLERGIES:  is allergic to codeine.  MEDICATIONS:  Current Outpatient Prescriptions  Medication Sig Dispense Refill  . AMBIEN 10 MG tablet Take 1 tablet (10 mg total) by mouth at bedtime as needed for sleep. 30 tablet 0  . BIOTIN 5000 PO Take 5,000 mg by mouth every other day.     . cetirizine (ZYRTEC) 10 MG tablet Take 10 mg by mouth daily.    . Cholecalciferol (VITAMIN D3) 2000 UNITS TABS Take 1 tablet by mouth daily.    . CRESTOR 5 MG tablet take 1 tablet by mouth once daily 90 tablet 1  . dicyclomine (BENTYL) 10 MG capsule Take one po BID 180 capsule 0  . fish oil-omega-3 fatty acids 1000 MG capsule Take 1 g by mouth daily.     . fluocinonide cream (LIDEX) 0.05 % Apply topically 2 (two) times daily. To itchy rashas needed, not on face 30 g 2  . Multiple Vitamin (MULTIVITAMIN WITH MINERALS) TABS tablet Take 1 tablet by mouth daily.    . Multiple Vitamin (MULTIVITAMIN) tablet Take 1 tablet by mouth daily.    Marland Kitchen NASACORT AQ 55 MCG/ACT AERO nasal inhaler Place 2 sprays into the nose daily. 1 Inhaler 0  . NEXIUM 40 MG capsule take 1 capsule by mouth once daily 90 capsule 1  . Probiotic Product ([employer] PROBIOTIC) CHEW Chew 2 capsules by mouth daily.    . sertraline (ZOLOFT) 100 MG tablet take 1 tablet by mouth once daily 90 tablet 1   No current facility-administered medications for this visit.    REVIEW OF SYSTEMS:   Constitutional: Denies fevers, chills or abnormal night sweats Eyes: Denies blurriness of vision, double vision or watery eyes Ears, nose, mouth, throat, and face: Denies mucositis or sore throat Respiratory: Denies cough, dyspnea or wheezes Cardiovascular: Denies palpitation, chest discomfort or lower extremity swelling Gastrointestinal:  Denies nausea, heartburn or change in bowel habits Skin: Denies  abnormal skin rashes Lymphatics: Denies new lymphadenopathy or easy bruising Neurological:Denies numbness, tingling or new weaknesses Behavioral/Psych: Mood is stable, no new changes  All other systems were reviewed with the patient and are negative.  PHYSICAL EXAMINATION: ECOG PERFORMANCE STATUS: 0 - Asymptomatic  Filed Vitals:   08/08/14 1052  BP: 122/70  Pulse: 93  Temp: 98 F (36.7 C)  Resp: 17   Filed Weights   08/08/14 1052  Weight: 157 lb 3.2 oz (71.305 kg)    GENERAL:alert, no distress and comfortable SKIN: skin color, texture, turgor are normal, no rashes or significant lesions EYES: normal, conjunctiva are pink and non-injected, sclera clear OROPHARYNX:no exudate, no erythema and lips, buccal mucosa, and tongue normal  NECK: supple, thyroid normal size, non-tender, without nodularity LYMPH:  no palpable lymphadenopathy in the cervical, axillary or inguinal LUNGS: clear to auscultation and percussion with normal breathing effort HEART: regular rate & rhythm and no murmurs and no lower extremity edema ABDOMEN:abdomen soft, non-tender and normal bowel sounds Musculoskeletal:no cyanosis of digits and no clubbing  PSYCH: alert & oriented x 3 with fluent speech NEURO: no focal motor/sensory deficits Breasts: Breast inspection showed a positive surgical scar at the left upper outer quadrant, we'll healed, no surrounding skin erythema or discharge. No skin change or nipple discharge. Palpation of the breasts and axilla revealed no obvious mass that I could appreciate.  LABORATORY DATA:  I have reviewed the data as listed Lab Results  Component Value Date   WBC 10.4 06/29/2014   HGB 13.3 06/29/2014   HCT 39.2 06/29/2014   MCV 91.2 06/29/2014   PLT 277 06/29/2014   Lab Results  Component Value Date   NA 141 06/29/2014   K 4.5 06/29/2014   CL 101 06/29/2014   CO2 25 06/29/2014   Pathology report 07/01/2014 FINAL DIAGNOSIS Diagnosis 1. Breast, lumpectomy, left -  DUCTAL CARCINOMA IN SITU WITH CALCIFICATIONS, LOW GRADE, SPANNING 1.8 CM. - THE SURGICAL RESECTION MARGINS ARE NEGATIVE FOR CARCINOMA. - SEE ONCOLOGY TABLE BELOW. 2. Breast, excision, medial margin left - FIBROCYSTIC CHANGES. - THERE IS NO EVIDENCE OF MALIGNANCY. - SEE COMMENT. 3. Breast, excision, superior margin left - FIBROCYSTIC CHANGES. - THERE IS NO EVIDENCE OF MALIGNANCY. - SEE COMMENT. 4. Breast, excision, lateral margin left - FIBROCYSTIC CHANGES. - THERE IS NO EVIDENCE OF MALIGNANCY. - SEE COMMENT. 5. Breast, excision, inferior margin left - FIBROCYSTIC CHANGES. - THERE IS NO EVIDENCE OF MALIGNANCY. - SEE COMMENT. Microscopic Comment 1. BREAST, IN SITU CARCINOMA Specimen, including laterality: Left breast Procedure (include lymph node sampling sentinel-non-sentinel: Lumpectomy and additional margin resection x 4 Grade of carcinoma: Low grade Necrosis: Not identified. Estimated tumor size: (gross measurement): 1.8 cm Treatment effect: N/A Distance to closest margin: Greater than 0.2 cm to all margins Breast prognostic profile: [phone] 1 of 3 FINAL for SAHIAN, DELAFOSSE [id number]) Microscopic Comment(continued) Estrogen receptor: 100%, strong staining intensity Progesterone receptor: 100%, strong staining intensity TNM: pTis, pNX (JK:kh 07-04-14) 2. -5. The surgical resection  RADIOGRAPHIC STUDIES: MRI of bilateral breast Jul 17, 2014 IMPRESSION: No evidence of residual left breast malignancy. No abnormal enhancement is seen to suggest malignancy in either breast.  There are the expected post biopsy changes including low level enhancement in the upper left breast.  RECOMMENDATION: Continue with current treatment plan for the newly diagnosed left breast DCIS.  BI-RADS CATEGORY 6: Known biopsy-proven malignancy.   ASSESSMENT:  left breast DCIS s/p lumpectomy, ER/PR positive  PLAN:  #1 left breast DCIS, ER/PR positive The patient had early  stage disease. She is considered cured of disease.  Any form of adjuvant treatment is for prevention of disease recurrence.  Due to her prior left breast radiation history, no adjuvant radiation at this time I recommend adjuvant tamoxifen for 5 years to reduce future risks of breast cancer. She tolerated tamoxifen well before, and I anticipate she will do well again  this time. The side effects of tamoxifen which includes but not limited to, hot flash, nausea, fatigue, risk of endometrial cancer (she had  Hysterectomy)  and DVT, which were discussed with patient in details. She agrees to proceed. I sent a prescription of tamoxifen to her pharmacy with refills. She is currently on Zoloft, which will  have interaction with tamoxifen. I recommend her to change Zoloft to Lexapro or Effexor which has no significant interaction with tamoxifen. She is going to see her primary care physician Dr. Regis Bill tomorrow and will discuss with this. I recommend her to start tamoxifen after she switches her antidepressant medication.     #2 Bone health  No history of osteoporosis. I recommend her take calcium 1 g a day and vitamin D.  Plan 1. Repeat labs CBC and CMP today  2. start tamoxifen after she changes Zoloft to Lexapro or Effexor.  3 follow-up with me in 3 months with lab CBC and CMP     All questions were answered. The patient knows to call the clinic with any problems, questions or concerns. I spent 25 minutes counseling the patient face to face. The total time spent in the appointment was 40 minutes and more than 50% was on counseling.     Truitt Merle, MD 08/08/2014 11:40 AM

## 2014-08-08 ENCOUNTER — Ambulatory Visit: Payer: Medicare Other

## 2014-08-08 ENCOUNTER — Ambulatory Visit (HOSPITAL_BASED_OUTPATIENT_CLINIC_OR_DEPARTMENT_OTHER): Payer: Medicare Other | Admitting: Hematology

## 2014-08-08 ENCOUNTER — Encounter: Payer: Self-pay | Admitting: Hematology

## 2014-08-08 ENCOUNTER — Ambulatory Visit (HOSPITAL_BASED_OUTPATIENT_CLINIC_OR_DEPARTMENT_OTHER): Payer: Medicare Other

## 2014-08-08 VITALS — BP 122/70 | HR 93 | Temp 98.0°F | Resp 17 | Ht 65.0 in | Wt 157.2 lb

## 2014-08-08 DIAGNOSIS — D0512 Intraductal carcinoma in situ of left breast: Secondary | ICD-10-CM | POA: Diagnosis not present

## 2014-08-08 DIAGNOSIS — Z853 Personal history of malignant neoplasm of breast: Secondary | ICD-10-CM | POA: Diagnosis not present

## 2014-08-08 LAB — COMPREHENSIVE METABOLIC PANEL (CC13)
ALT: 10 U/L (ref 0–55)
AST: 17 U/L (ref 5–34)
Albumin: 3.6 g/dL (ref 3.5–5.0)
Alkaline Phosphatase: 130 U/L (ref 40–150)
Anion Gap: 10 mEq/L (ref 3–11)
BILIRUBIN TOTAL: 0.41 mg/dL (ref 0.20–1.20)
BUN: 18.3 mg/dL (ref 7.0–26.0)
CO2: 26 meq/L (ref 22–29)
Calcium: 10.1 mg/dL (ref 8.4–10.4)
Chloride: 104 mEq/L (ref 98–109)
Creatinine: 1.3 mg/dL — ABNORMAL HIGH (ref 0.6–1.1)
Glucose: 105 mg/dl (ref 70–140)
Potassium: 4.9 mEq/L (ref 3.5–5.1)
SODIUM: 141 meq/L (ref 136–145)
Total Protein: 7.1 g/dL (ref 6.4–8.3)

## 2014-08-08 LAB — CBC WITH DIFFERENTIAL/PLATELET
BASO%: 0.5 % (ref 0.0–2.0)
Basophils Absolute: 0.1 10*3/uL (ref 0.0–0.1)
EOS%: 2.2 % (ref 0.0–7.0)
Eosinophils Absolute: 0.3 10*3/uL (ref 0.0–0.5)
HCT: 38.4 % (ref 34.8–46.6)
HGB: 12.6 g/dL (ref 11.6–15.9)
LYMPH%: 12.8 % — AB (ref 14.0–49.7)
MCH: 30.1 pg (ref 25.1–34.0)
MCHC: 32.8 g/dL (ref 31.5–36.0)
MCV: 91.6 fL (ref 79.5–101.0)
MONO#: 1.1 10*3/uL — AB (ref 0.1–0.9)
MONO%: 8.8 % (ref 0.0–14.0)
NEUT#: 9.2 10*3/uL — ABNORMAL HIGH (ref 1.5–6.5)
NEUT%: 75.7 % (ref 38.4–76.8)
Platelets: 281 10*3/uL (ref 145–400)
RBC: 4.19 10*6/uL (ref 3.70–5.45)
RDW: 13.2 % (ref 11.2–14.5)
WBC: 12.1 10*3/uL — AB (ref 3.9–10.3)
lymph#: 1.6 10*3/uL (ref 0.9–3.3)

## 2014-08-08 MED ORDER — TAMOXIFEN CITRATE 20 MG PO TABS: 20.0000 mg | ORAL_TABLET | Freq: Every day | ORAL | Status: DC

## 2014-08-08 NOTE — Progress Notes (Signed)
Checked in new pt with no financial concerns at this time.  Pt has 2 insurances so financial services may not be needed but she has my card for any questions or concerns.

## 2014-08-09 ENCOUNTER — Encounter: Payer: Self-pay | Admitting: Internal Medicine

## 2014-08-09 ENCOUNTER — Ambulatory Visit (INDEPENDENT_AMBULATORY_CARE_PROVIDER_SITE_OTHER): Payer: Medicare Other | Admitting: Internal Medicine

## 2014-08-09 VITALS — BP 144/76 | Temp 98.3°F | Ht 65.0 in | Wt 157.0 lb

## 2014-08-09 DIAGNOSIS — Z853 Personal history of malignant neoplasm of breast: Secondary | ICD-10-CM

## 2014-08-09 DIAGNOSIS — Z79899 Other long term (current) drug therapy: Secondary | ICD-10-CM

## 2014-08-09 DIAGNOSIS — R748 Abnormal levels of other serum enzymes: Secondary | ICD-10-CM | POA: Diagnosis not present

## 2014-08-09 DIAGNOSIS — G4700 Insomnia, unspecified: Secondary | ICD-10-CM

## 2014-08-09 DIAGNOSIS — F4322 Adjustment disorder with anxiety: Secondary | ICD-10-CM

## 2014-08-09 DIAGNOSIS — R7989 Other specified abnormal findings of blood chemistry: Secondary | ICD-10-CM

## 2014-08-09 MED ORDER — AMBIEN 10 MG PO TABS: 10.0000 mg | ORAL_TABLET | Freq: Every evening | ORAL | Status: DC | PRN

## 2014-08-09 MED ORDER — MIRTAZAPINE 15 MG PO TABS: 15.0000 mg | ORAL_TABLET | Freq: Every day | ORAL | Status: DC

## 2014-08-09 NOTE — Progress Notes (Signed)
Pre visit review using our clinic review tool, if applicable. No additional management support is needed unless otherwise documented below in the visit note.  Chief Complaint  Patient presents with  . Follow-up    Medications Ambien Zoloft question about Celebrex    HPI: Jeanne Tran 80 y.o.  comes in today for a number of issues regarding her medications and sleep  In regard to her breast cancer she was given a prescription for May start tamoxifen. However she was told that she should get off of the Zoloft as soon as possible and look for an alternative for the Zoloft. This medication has served her well in regard to anxiety depression. She is currently on 100 mg even though that is not listed on her medication sheet.   In regard to her sleep she has been on Ambien 10 mg branded in the past with a prior authorization because lower doses and non-branded didn't work this helps her get a few hours asleep. In the past she was on Atarax hydroxyzine however because this in addition is a medication not recommended by [facility]  criteria she has gone off of it before this diagnosis however she isn't sleeping well at all and feels terrible about this  She was using Celebrex about once a day because it does help her aches and pains and her joint pains she can use Tylenol but was avoiding taking that every 6 hours. She has had laboratory studies done recently to check her kidney function. ROS: See pertinent positives and negatives per HPI.  Past Medical History  Diagnosis Date  . Hyperlipidemia   . RLS (restless legs syndrome)   . Breast cancer     hx of left with radiation and lumpectomy  . History of colonic polyps   . GERD (gastroesophageal reflux disease)   . History of ERCP     and sphincterotomy for abnormal lfts and dilated biliary ducts 5/05  . Pulmonary nodule   . Migraine headache   . Wears contact lenses     No family history on file.  History   Social History  . Marital  Status: Widowed    Spouse Name: N/A    Number of Children: N/A  . Years of Education: N/A   Social History Main Topics  . Smoking status: Former Smoker -- 3.00 packs/day for 25 years    Types: Cigarettes    Quit date: 02/26/1994  . Smokeless tobacco: Never Used  . Alcohol Use: No  . Drug Use: No  . Sexual Activity: None   Other Topics Concern  . 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                    Outpatient Encounter Prescriptions as of 08/09/2014  Medication Sig  . AMBIEN 10 MG tablet Take 1 tablet (10 mg total) by mouth at bedtime as needed for sleep (brand only).  Marland Kitchen BIOTIN 5000 PO Take 5,000 mg by mouth every other day.   . cetirizine (ZYRTEC) 10 MG tablet Take 10 mg by mouth daily.  . Cholecalciferol (VITAMIN D3) 2000 UNITS TABS Take 1 tablet by mouth daily.  . CRESTOR 5 MG tablet take 1 tablet by mouth once daily  . dicyclomine (BENTYL) 10 MG capsule Take one po BID  . fish oil-omega-3 fatty acids 1000 MG capsule  Take 1 g by mouth daily.   . fluocinonide cream (LIDEX) 0.05 % Apply topically 2 (two) times daily. To itchy rashas needed, not on face  . Multiple Vitamin (MULTIVITAMIN WITH MINERALS) TABS tablet Take 1 tablet by mouth daily.  . Multiple Vitamin (MULTIVITAMIN) tablet Take 1 tablet by mouth daily.  Marland Kitchen NASACORT AQ 55 MCG/ACT AERO nasal inhaler Place 2 sprays into the nose daily.  Marland Kitchen NEXIUM 40 MG capsule take 1 capsule by mouth once daily  . Probiotic Product ([employer] PROBIOTIC) CHEW Chew 2 capsules by mouth daily.  . sertraline (ZOLOFT) 100 MG tablet Take 100 mg by mouth daily. To decrease to 50 mg and wean as directed   . [DISCONTINUED] AMBIEN 10 MG tablet Take 1 tablet (10 mg total) by mouth at bedtime as needed for sleep.  . [DISCONTINUED] AMBIEN 10 MG tablet Take 1 tablet (10 mg total) by mouth at bedtime as needed for sleep (brand only).  . mirtazapine (REMERON) 15 MG tablet Take 1  tablet (15 mg total) by mouth at bedtime. Can increase to 2 hs for sleep  . tamoxifen (NOLVADEX) 20 MG tablet Take 1 tablet (20 mg total) by mouth daily. (Patient not taking: Reported on 08/09/2014)    EXAM:  BP 144/76 mmHg  Temp(Src) 98.3 F (36.8 C) (Oral)  Ht 5\' 5"  (1.651 m)  Wt 157 lb (71.215 kg)  BMI 26.13 kg/m2  Body mass index is 26.13 kg/(m^2).  GENERAL: vitals reviewed and listed above, alert, oriented, appears well hydrated and in no acute distress she looks sleep deprived tired but cognitively intact HEENT: atraumatic, conjunctiva  clear, no obvious abnormalities on inspection of external nose and ears NECK: no obvious masses on inspection palpation   MS: moves all extremities without noticeable focal  abnormality PSYCH: pleasant and cooperative, no obvious depression or anxiety looks sleepy tired exhausted Lab Results  Component Value Date   WBC 12.1* 08/08/2014   HGB 12.6 08/08/2014   HCT 38.4 08/08/2014   PLT 281 08/08/2014   GLUCOSE 105 08/08/2014   CHOL 174 12/14/2013   TRIG 92.0 12/14/2013   HDL 76.60 12/14/2013   LDLCALC 79 12/14/2013   ALT 10 08/08/2014   AST 17 08/08/2014   NA 141 08/08/2014   K 4.9 08/08/2014   CL 101 06/29/2014   CREATININE 1.3* 08/08/2014   BUN 18.3 08/08/2014   CO2 26 08/08/2014   TSH 1.55 12/14/2013   INR 1.00 06/29/2014   HGBA1C 6.3 12/14/2013    ASSESSMENT AND PLAN:  Discussed the following assessment and plan:  Adjustment disorder with anxiety - Had been stable told she needs to stop the Zoloft to go on tamoxifen most SSRIs may interfere with tamoxifen will have to review other options  Elevated serum creatinine - Concern at 1.3 having been on Celebrex at this time remain off and will follow renal function  Medication management - Plan decreasing the Zoloft adding of the Remeron at night and adjusting medication at follow-up  Persistent disorder of initiating or maintaining sleep - Has tried many things in the past she  still sleep deprived thinks the Ambien helps some but not enough benefit more than risk at this time 10 mg branded Ambie  BREAST CANCER, HX OF - to begin tamoxifen n  Electronic med list appears to be wrong she is still taking the Zoloft 100 mg but it is written as D/C. She hasn't taken the tamoxifen. Extended time during this visit trying to actually get her med list.  Discussed options risk and benefit upper first she not go back on the Atarax will try Remeron increased dose. Uncertain at this time what will substitute for Zoloft but will look up other possibilities drug interactions. May have to contact oncology about this. Plan our OV in 1 months consider weaning Zoloft further before then Plan rechecking BMP at some point because of the elevation in creatinine over baseline.  -Patient advised to return or notify health care team  if  or new concerns arise.  Patient Instructions  Lorrin Mais brand 10 mg with caution  As we discussed . We will add  remeron at night also    ( older antidepressant  That causes drowsiness)   zoloft  Can decrease the effectiveness of the  Tamoxifen  But otherwise not dangerous  Decrease the sertraline to 50 mg per day for a week then will look for alternatives to this med but most in the group may have the same problem.  I will research the drug interactions.  Kidney function is down some from October  . For now stay  Off of the celebrex and use tylenol until improves and then we can reassess.   ROV in 1 month or as needed to  Go over these med changes     Mariann Laster K. Panosh M.D.  Total visit 71mins > 50% spent counseling and coordinating care   It appears that buspar may not interfere with tamoxifen  For anxiety

## 2014-08-09 NOTE — Patient Instructions (Addendum)
ambien brand 10 mg with caution  As we discussed . We will add  remeron at night also    ( older antidepressant  That causes drowsiness)   zoloft  Can decrease the effectiveness of the  Tamoxifen  But otherwise not dangerous  Decrease the sertraline to 50 mg per day for a week then will look for alternatives to this med but most in the group may have the same problem.  I will research the drug interactions.  Kidney function is down some from October  . For now stay  Off of the celebrex and use tylenol until improves and then we can reassess.   ROV in 1 month or as needed to  Go over these med changes

## 2014-08-14 DIAGNOSIS — Z79899 Other long term (current) drug therapy: Secondary | ICD-10-CM | POA: Insufficient documentation

## 2014-08-14 DIAGNOSIS — R7989 Other specified abnormal findings of blood chemistry: Secondary | ICD-10-CM | POA: Insufficient documentation

## 2014-08-14 DIAGNOSIS — Z515 Encounter for palliative care: Secondary | ICD-10-CM | POA: Insufficient documentation

## 2014-08-16 ENCOUNTER — Telehealth: Payer: Self-pay | Admitting: Family Medicine

## 2014-08-16 NOTE — Telephone Encounter (Signed)
Pt called and left a message on my machine.  Stated she needs a 90 day refill of her Ambien.  Pt was seen in the office and also given a prescription on 08/09/14.  Left a message for the pt to return my call.

## 2014-08-17 MED ORDER — AMBIEN 10 MG PO TABS: 10.0000 mg | ORAL_TABLET | Freq: Every evening | ORAL | Status: DC | PRN

## 2014-08-17 NOTE — Telephone Encounter (Signed)
Pt has misplaced the written rx.  I called in 90 days of Ambien to the pharmacy.

## 2014-08-22 ENCOUNTER — Telehealth: Payer: Self-pay | Admitting: Family Medicine

## 2014-08-22 MED ORDER — ESCITALOPRAM OXALATE 10 MG PO TABS: 10.0000 mg | ORAL_TABLET | Freq: Every day | ORAL | Status: DC

## 2014-08-22 NOTE — Telephone Encounter (Signed)
Pt would like to for Dr. Regis Bill to take over dicyclomine (BENTYL) 10 MG capsule.  Two capsules daily.  She stated Dr. Olevia Perches no longer needs to see her due to age. Please advise.  Thanks!

## 2014-08-22 NOTE — Telephone Encounter (Signed)
Her oncologist says that [facility] to use Lexapro( see nov note)     If she is doing ok with 50 mg of zoloft as we discussed     Change over to lexapro 10 mg 1 po qd Disp 30 Refill x 1         Then make sure has rov in 3-4 weeks         Thanks     [facility]

## 2014-08-22 NOTE — Telephone Encounter (Signed)
Spoke to the pt.  Advised she will stop the Zoloft and begin Lexapro 10 mg 1 po qd.  She has made a follow up visit on 09/26/14 @ 11 AM.

## 2014-08-24 NOTE — Telephone Encounter (Signed)
Would like to know if she should take glucosamine-chondroitin OTC?  Would it interfere with her other medications?  Stated, "this old achy body is no fun."  Please advise.  Thanks!

## 2014-08-26 MED ORDER — DICYCLOMINE HCL 10 MG PO CAPS: ORAL_CAPSULE | ORAL | Status: DC

## 2014-08-26 NOTE — Addendum Note (Signed)
Addended by: Miles Costain T on: 08/26/2014 10:37 AM   Modules accepted: Orders

## 2014-08-26 NOTE — Telephone Encounter (Signed)
Pt notified.  Bentyl sent to the pharmacy by e-scribe.

## 2014-08-26 NOTE — Telephone Encounter (Signed)
Ok to rx the bentyl for 90 days  until we see her in January   Ok to try the supplement but would wait until changing meds to make sure no side effects. Helps some people and not others and can change cholesterol and blood sugars slightly sometimes.

## 2014-08-26 NOTE — Telephone Encounter (Signed)
Pt would like to know if there is something she can take for her arthritis since her Celebrex has been stopped.

## 2014-08-28 NOTE — Telephone Encounter (Signed)
Tylenol as discussed 500 mg twice a day. If a given joint such as hand or knee is more severe than can use voltaren topical gel 2-4 grams  If needed qid for hand wrist or knee joint pain.   Disp enough for a 2 weeks trial and if doing well can get more.

## 2014-08-29 NOTE — Telephone Encounter (Signed)
Left a message on home machine for the pt to return my call.

## 2014-08-30 MED ORDER — DICLOFENAC SODIUM 1 % TD GEL: 4.0000 g | Freq: Four times a day (QID) | TRANSDERMAL | Status: DC

## 2014-08-30 NOTE — Addendum Note (Signed)
Addended by: Miles Costain T on: 08/30/2014 03:06 PM   Modules accepted: Orders

## 2014-08-30 NOTE — Telephone Encounter (Signed)
Spoke to the pt.  Advised that she may use Tylenol 500 mg bid.  Also sent in the diclofenac gel as she stated she has joint pain.  0 refills.  Pt will call back to let us know how it is working for her.

## 2014-09-02 DIAGNOSIS — L814 Other melanin hyperpigmentation: Secondary | ICD-10-CM | POA: Diagnosis not present

## 2014-09-02 DIAGNOSIS — Z85828 Personal history of other malignant neoplasm of skin: Secondary | ICD-10-CM | POA: Diagnosis not present

## 2014-09-02 DIAGNOSIS — L821 Other seborrheic keratosis: Secondary | ICD-10-CM | POA: Diagnosis not present

## 2014-09-02 DIAGNOSIS — D1801 Hemangioma of skin and subcutaneous tissue: Secondary | ICD-10-CM | POA: Diagnosis not present

## 2014-09-12 ENCOUNTER — Telehealth: Payer: Self-pay | Admitting: Family Medicine

## 2014-09-12 DIAGNOSIS — M7989 Other specified soft tissue disorders: Secondary | ICD-10-CM

## 2014-09-12 NOTE — Telephone Encounter (Signed)
Pt calls and leaves a message on my machine.  States the Lexapro is not strong enough or it is making her agitated.  Not sure if she needs to increase her dose or try something new.  Also states her rt hand is still swollen.  Is requesting a referral to the hand center.

## 2014-09-12 NOTE — Telephone Encounter (Signed)
Can try 15 mg  Of lexapro  Per day 1.5 tabs and disp 45   Please do hand referral.

## 2014-09-13 DIAGNOSIS — H2513 Age-related nuclear cataract, bilateral: Secondary | ICD-10-CM | POA: Diagnosis not present

## 2014-09-13 DIAGNOSIS — H18822 Corneal disorder due to contact lens, left eye: Secondary | ICD-10-CM | POA: Diagnosis not present

## 2014-09-13 DIAGNOSIS — H02534 Eyelid retraction left upper eyelid: Secondary | ICD-10-CM | POA: Diagnosis not present

## 2014-09-13 NOTE — Telephone Encounter (Signed)
LMOM for the pt to return my call.

## 2014-09-14 NOTE — Telephone Encounter (Signed)
Tried calling patient's home number.  Received a busy signal. Will try again at a later time.

## 2014-09-14 NOTE — Telephone Encounter (Signed)
Spoke to the pt.  She will increase Lexapro to 1.5 tabs daily.  Does not want a prescription at this time.  Will call me back on Monday to decide. Hand referral placed in the system.

## 2014-09-19 DIAGNOSIS — M1811 Unilateral primary osteoarthritis of first carpometacarpal joint, right hand: Secondary | ICD-10-CM | POA: Diagnosis not present

## 2014-09-19 DIAGNOSIS — M19041 Primary osteoarthritis, right hand: Secondary | ICD-10-CM | POA: Diagnosis not present

## 2014-09-25 DIAGNOSIS — S61401A Unspecified open wound of right hand, initial encounter: Secondary | ICD-10-CM | POA: Diagnosis not present

## 2014-09-26 ENCOUNTER — Encounter: Payer: Self-pay | Admitting: Internal Medicine

## 2014-09-26 ENCOUNTER — Ambulatory Visit (INDEPENDENT_AMBULATORY_CARE_PROVIDER_SITE_OTHER): Payer: Medicare Other | Admitting: Internal Medicine

## 2014-09-26 VITALS — BP 110/76 | HR 85 | Temp 98.6°F | Wt 162.9 lb

## 2014-09-26 DIAGNOSIS — Z853 Personal history of malignant neoplasm of breast: Secondary | ICD-10-CM | POA: Diagnosis not present

## 2014-09-26 DIAGNOSIS — Z79899 Other long term (current) drug therapy: Secondary | ICD-10-CM | POA: Diagnosis not present

## 2014-09-26 DIAGNOSIS — N183 Chronic kidney disease, stage 3 unspecified: Secondary | ICD-10-CM

## 2014-09-26 DIAGNOSIS — N189 Chronic kidney disease, unspecified: Secondary | ICD-10-CM | POA: Insufficient documentation

## 2014-09-26 DIAGNOSIS — N289 Disorder of kidney and ureter, unspecified: Secondary | ICD-10-CM | POA: Insufficient documentation

## 2014-09-26 DIAGNOSIS — F4322 Adjustment disorder with anxiety: Secondary | ICD-10-CM

## 2014-09-26 DIAGNOSIS — M7989 Other specified soft tissue disorders: Secondary | ICD-10-CM | POA: Insufficient documentation

## 2014-09-26 DIAGNOSIS — G4700 Insomnia, unspecified: Secondary | ICD-10-CM

## 2014-09-26 DIAGNOSIS — M25439 Effusion, unspecified wrist: Secondary | ICD-10-CM

## 2014-09-26 MED ORDER — ESOMEPRAZOLE MAGNESIUM 40 MG PO CPDR: 40.0000 mg | DELAYED_RELEASE_CAPSULE | Freq: Every day | ORAL | Status: DC

## 2014-09-26 MED ORDER — FLUOCINONIDE 0.05 % EX CREA: TOPICAL_CREAM | Freq: Two times a day (BID) | CUTANEOUS | Status: DC

## 2014-09-26 MED ORDER — ESCITALOPRAM OXALATE 10 MG PO TABS: 15.0000 mg | ORAL_TABLET | Freq: Every day | ORAL | Status: DC

## 2014-09-26 NOTE — Progress Notes (Signed)
Chief Complaint  Patient presents with  . Follow-up    HPI: Jeanne Tran  81 y.o. comes in for fu of medication changes  Other ?s   Lexapro 15 mg seems to be better than 10 needs refill   And remeron  ambien as needed   Hands swollen hand special sit said nothing to do dont have noet yet   Right hand inc swelling and rom dec hard to grip   Why not to take celebrex ? Disc   Refill llidex in case  For itchy rash no hx of psoriasis  Has hx of rosacea   Does she still need to take the nexium?  Can take otc calcium antacid if needed.?  When misses med  No rebounmd at this time    ROS: See pertinent positives and negatives per HPI. No feer falling  Has thorn rose bush  cut right hand over weekend no infection has it wrapped  Contacts on hold per dr Barrie Lyme until eye changes better   Past Medical History  Diagnosis Date  . Hyperlipidemia   . RLS (restless legs syndrome)   . Breast cancer     hx of left with radiation and lumpectomy  . History of colonic polyps   . GERD (gastroesophageal reflux disease)   . History of ERCP     and sphincterotomy for abnormal lfts and dilated biliary ducts 5/05  . Pulmonary nodule   . Migraine headache   . Wears contact lenses     No family history on file.  History   Social History  . Marital Status: Widowed    Spouse Name: N/A    Number of Children: N/A  . Years of Education: N/A   Social History Main Topics  . Smoking status: Former Smoker -- 3.00 packs/day for 25 years    Types: Cigarettes    Quit date: 02/26/1994  . Smokeless tobacco: Never Used  . Alcohol Use: No  . Drug Use: No  . Sexual Activity: None   Other Topics Concern  . 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                    Outpatient Encounter Prescriptions as of 09/26/2014  Medication Sig  . BIOTIN 5000 PO Take 5,000 mg by mouth every other day.   . cetirizine (ZYRTEC) 10 MG  tablet Take 10 mg by mouth daily.  . Cholecalciferol (VITAMIN D3) 2000 UNITS TABS Take 1 tablet by mouth daily.  . CRESTOR 5 MG tablet take 1 tablet by mouth once daily  . dicyclomine (BENTYL) 10 MG capsule Take one po BID  . escitalopram (LEXAPRO) 10 MG tablet Take 1.5 tablets (15 mg total) by mouth daily.  Marland Kitchen esomeprazole (NEXIUM) 40 MG capsule Take 1 capsule (40 mg total) by mouth daily.  . fish oil-omega-3 fatty acids 1000 MG capsule Take 1 g by mouth daily.   . fluocinonide cream (LIDEX) 0.05 % Apply topically 2 (two) times daily. To itchy rashas needed, not on face  . mirtazapine (REMERON) 15 MG tablet Take 1 tablet (15 mg total) by mouth at bedtime. Can increase to 2 hs for sleep  . Multiple Vitamin (MULTIVITAMIN WITH MINERALS) TABS tablet Take 1 tablet by mouth daily.  . Multiple Vitamin (MULTIVITAMIN) tablet Take 1 tablet by mouth daily.  Marland Kitchen  NASACORT AQ 55 MCG/ACT AERO nasal inhaler Place 2 sprays into the nose daily.  . Probiotic Product ([employer] PROBIOTIC) CHEW Chew 2 capsules by mouth daily.  . tamoxifen (NOLVADEX) 20 MG tablet Take 1 tablet (20 mg total) by mouth daily.  . [DISCONTINUED] escitalopram (LEXAPRO) 10 MG tablet Take 1 tablet (10 mg total) by mouth daily.  . [DISCONTINUED] fluocinonide cream (LIDEX) 0.05 % Apply topically 2 (two) times daily. To itchy rashas needed, not on face  . [DISCONTINUED] NEXIUM 40 MG capsule take 1 capsule by mouth once daily  . diclofenac sodium (VOLTAREN) 1 % GEL Apply 4 g topically 4 (four) times daily. (Patient not taking: Reported on 09/26/2014)  . [DISCONTINUED] sertraline (ZOLOFT) 100 MG tablet Take 100 mg by mouth daily. To decrease to 50 mg and wean as directed     EXAM:  BP 110/76 mmHg  Pulse 85  Temp(Src) 98.6 F (37 C) (Oral)  Wt 162 lb 14.4 oz (73.891 kg)  SpO2 96%  Body mass index is 27.11 kg/(m^2).  GENERAL: vitals reviewed and listed above, alert, oriented, appears well hydrated and in no acute distress HEENT: atraumatic,  conjunctiva  clear, no obvious abnormalities on inspection of external nose and ears  edema or exudate  CV: HRRR, no clubbing cyanosis or  peripheral edema nl cap refill  MS: moves all extremities left fingers all swollen  Through out all joints and fingers almost sausage fingers  Dec rom from swelling  No warmth some pink color   PSYCH: pleasant and cooperative, no obvious depression or anxiety Lab Results  Component Value Date   WBC 12.1* 08/08/2014   HGB 12.6 08/08/2014   HCT 38.4 08/08/2014   PLT 281 08/08/2014   GLUCOSE 105 08/08/2014   CHOL 174 12/14/2013   TRIG 92.0 12/14/2013   HDL 76.60 12/14/2013   LDLCALC 79 12/14/2013   ALT 10 08/08/2014   AST 17 08/08/2014   NA 141 08/08/2014   K 4.9 08/08/2014   CL 101 06/29/2014   CREATININE 1.3* 08/08/2014   BUN 18.3 08/08/2014   CO2 26 08/08/2014   TSH 1.55 12/14/2013   INR 1.00 06/29/2014   HGBA1C 6.3 12/14/2013    ASSESSMENT AND PLAN:  Discussed the following assessment and plan:  Adjustment disorder with anxiety - transitional meds  better on lexapro 30 and remeron anbien as needed   Medication management  Swelling of both hands - rifght more than left  with stoiffness and dec rom had serologies in past neg see rheumatology  - Plan: Ambulatory referral to Rheumatology  Persistent disorder of initiating or maintaining sleep  Wrist swelling, unspecified laterality - Plan: Ambulatory referral to Rheumatology  Chronic renal insufficiency, stage 3 (moderate) - cr 1- 1.3 range gfr 40 range   BREAST CANCER, HX OF   nexium for years  When forgets   Remote hx of stricture   Will opin dr Olevia Perches aboud advisablitly of decreasing dose of this med risk benefit issue  -Patient advised to return or notify health care team  if symptoms worsen ,persist or new concerns arise.  Patient Instructions  We will arrange for rheumatology referral to assess the hand swelling .  Stay on the lexapro 15 mg per day for now . Continue sleep    Regimen .  Still avoid  Antiinflammatories  For kidney protection.  Will flag dr Olevia Perches  About  Opinion on weaning  and getting off the   nexium or not.  Keep next appt.  Standley Brooking. Panosh M.D.  Pre visit review using our clinic review tool, if applicable. No additional management support is needed unless otherwise documented below in the visit note.

## 2014-09-26 NOTE — Patient Instructions (Addendum)
We will arrange for rheumatology referral to assess the hand swelling .  Stay on the lexapro 15 mg per day for now . Continue sleep   Regimen .  Still avoid  Antiinflammatories  For kidney protection.  Will flag dr Olevia Perches  About  Opinion on weaning  and getting off the   nexium or not.  Keep next appt.

## 2014-10-03 NOTE — Telephone Encounter (Signed)
Pt seen on 09/26/14 for follow up of Lexapro.

## 2014-10-04 ENCOUNTER — Telehealth: Payer: Self-pay | Admitting: Family Medicine

## 2014-10-04 DIAGNOSIS — H18822 Corneal disorder due to contact lens, left eye: Secondary | ICD-10-CM | POA: Diagnosis not present

## 2014-10-04 DIAGNOSIS — H2513 Age-related nuclear cataract, bilateral: Secondary | ICD-10-CM | POA: Diagnosis not present

## 2014-10-04 DIAGNOSIS — H02534 Eyelid retraction left upper eyelid: Secondary | ICD-10-CM | POA: Diagnosis not present

## 2014-10-04 NOTE — Telephone Encounter (Signed)
Pt called to report she needs a refill of escitalopram (LEXAPRO) 10 MG tablet.  Currently taking 1.5 tabs daily.  She is having a hard time cutting the tablet in half and sometimes the other half is crushed and not useable.  Will check with [facility] for any suggestions.

## 2014-10-04 NOTE — Telephone Encounter (Signed)
Please see if the pharmacy can cut these pills for her  if not  See if insurance will pay for    5 mg tabs take 3 po qd   90 days refill x 2

## 2014-10-05 DIAGNOSIS — H52219 Irregular astigmatism, unspecified eye: Secondary | ICD-10-CM | POA: Diagnosis not present

## 2014-10-05 DIAGNOSIS — H179 Unspecified corneal scar and opacity: Secondary | ICD-10-CM | POA: Diagnosis not present

## 2014-10-05 DIAGNOSIS — H16103 Unspecified superficial keratitis, bilateral: Secondary | ICD-10-CM | POA: Diagnosis not present

## 2014-10-05 DIAGNOSIS — H04123 Dry eye syndrome of bilateral lacrimal glands: Secondary | ICD-10-CM | POA: Diagnosis not present

## 2014-10-06 MED ORDER — ESCITALOPRAM OXALATE 5 MG PO TABS
15.0000 mg | ORAL_TABLET | Freq: Every day | ORAL | Status: DC
Start: 2014-10-06 — End: 2015-01-10

## 2014-10-06 NOTE — Telephone Encounter (Signed)
Spoke to [place] at the pharmacy.  The Board of Pharmacy's does not allow an employee to cut a patient's prescriptions.  She was unable to tell me if three 5 mg tabs would be covered.  She will have to run the prescription in order to tell me.  I will send in a new rx.  Left a message on home phone informing the pt to pickup her new rx at the pharmacy.

## 2014-10-07 ENCOUNTER — Ambulatory Visit: Payer: Medicare Other | Admitting: Internal Medicine

## 2014-10-09 NOTE — Progress Notes (Addendum)
Response from Dr Olevia Perches  "I saw Jeanne Tran  in 04/2013 for diarrhea. Before that, she had an ERCP in 2005 and 2006.for suspected bile duct obstruction. She does not have to be on a acid reducer from my standpoint., never had a dx of GERD or PUD".  Will disc this at her next appt

## 2014-10-18 ENCOUNTER — Encounter: Payer: Self-pay | Admitting: Internal Medicine

## 2014-10-18 ENCOUNTER — Ambulatory Visit (INDEPENDENT_AMBULATORY_CARE_PROVIDER_SITE_OTHER): Payer: Medicare Other | Admitting: Internal Medicine

## 2014-10-18 VITALS — BP 116/70 | Temp 98.2°F | Ht 65.0 in | Wt 162.7 lb

## 2014-10-18 DIAGNOSIS — Z853 Personal history of malignant neoplasm of breast: Secondary | ICD-10-CM | POA: Diagnosis not present

## 2014-10-18 DIAGNOSIS — R7989 Other specified abnormal findings of blood chemistry: Secondary | ICD-10-CM

## 2014-10-18 DIAGNOSIS — R748 Abnormal levels of other serum enzymes: Secondary | ICD-10-CM

## 2014-10-18 DIAGNOSIS — F4322 Adjustment disorder with anxiety: Secondary | ICD-10-CM | POA: Diagnosis not present

## 2014-10-18 DIAGNOSIS — R198 Other specified symptoms and signs involving the digestive system and abdomen: Secondary | ICD-10-CM

## 2014-10-18 DIAGNOSIS — G4700 Insomnia, unspecified: Secondary | ICD-10-CM | POA: Diagnosis not present

## 2014-10-18 DIAGNOSIS — M2550 Pain in unspecified joint: Secondary | ICD-10-CM

## 2014-10-18 DIAGNOSIS — Z0000 Encounter for general adult medical examination without abnormal findings: Secondary | ICD-10-CM

## 2014-10-18 DIAGNOSIS — Z79899 Other long term (current) drug therapy: Secondary | ICD-10-CM | POA: Diagnosis not present

## 2014-10-18 DIAGNOSIS — E785 Hyperlipidemia, unspecified: Secondary | ICD-10-CM | POA: Insufficient documentation

## 2014-10-18 LAB — BASIC METABOLIC PANEL
BUN: 23 mg/dL (ref 6–23)
CHLORIDE: 105 meq/L (ref 96–112)
CO2: 29 mEq/L (ref 19–32)
Calcium: 9.6 mg/dL (ref 8.4–10.5)
Creatinine, Ser: 1.16 mg/dL (ref 0.40–1.20)
GFR: 47.89 mL/min — AB (ref >60.00)
GLUCOSE: 98 mg/dL (ref 70–99)
Potassium: 4.6 mEq/L (ref 3.5–5.1)
Sodium: 139 mEq/L (ref 135–145)

## 2014-10-18 LAB — POCT URINALYSIS DIP (MANUAL ENTRY)
Bilirubin, UA: NEGATIVE
Blood, UA: NEGATIVE
GLUCOSE UA: NEGATIVE
Ketones, POC UA: NEGATIVE
LEUKOCYTES UA: NEGATIVE
NITRITE UA: NEGATIVE
Protein Ur, POC: NEGATIVE
Spec Grav, UA: <=1.005
Urobilinogen, UA: 0.2
pH, UA: 5.5

## 2014-10-18 LAB — T4, FREE: Free T4: 0.79 ng/dL (ref 0.60–1.60)

## 2014-10-18 LAB — LIPID PANEL
CHOL/HDL RATIO: 2
Cholesterol: 176 mg/dL (ref 0–200)
HDL: 73.4 mg/dL (ref >39.00)
LDL Cholesterol: 70 mg/dL (ref 0–99)
NONHDL: 102.6
TRIGLYCERIDES: 164 mg/dL — AB (ref 0.0–149.0)
VLDL: 32.8 mg/dL (ref 0.0–40.0)

## 2014-10-18 LAB — TSH: TSH: 3.37 u[IU]/mL (ref 0.35–4.50)

## 2014-10-18 MED ORDER — MIRTAZAPINE 15 MG PO TABS: 15.0000 mg | ORAL_TABLET | Freq: Every day | ORAL | Status: DC

## 2014-10-18 MED ORDER — DIAZEPAM 5 MG PO TABS: ORAL_TABLET | ORAL | Status: DC

## 2014-10-18 NOTE — Progress Notes (Signed)
Pre visit review using our clinic review tool, if applicable. No additional management support is needed unless otherwise documented below in the visit note.  Chief Complaint  Patient presents with  . Medicare Wellness Exam    HPI: Patient comes in today for Preventive Medicare wellness visit . And disease management medication management  Sleep seems to be doing okay on the Remeron with less Ambien  lexapro  Not helpful  And  zoloft was better still edgy more anxious this is trying 15 mg she was changed from Zoloft because of concern by oncology about interaction with her tamoxifen.  Bentyl not helping  Her GI area  .   As much as before has frequent small stools. No blood in stool or vomitingGI  Bentyl    bid  And now  No diarrhea frequent  Area .  Bitty bits of stool  Has  Joint eval  . By rheumatologist    Soon ; celebrex had helped . A lot but we told her to stop it because of her renal function. Mostly her back and hip at this time had been hand swollen.  Crestor continuing medication due for labs.  Has had did stop her contacts because of a corneal infection or abrasion is now on glasses makes it very hard to read has to use great magnifiers.  History of 2 minor falls in the last year states that her shoe got caught on a wet floor or object but no injury happened. Declines a major balance problem except when she gets up quickly.  Has to have some dental work done and asks for some Valium during as premedication she had been on that on a regular basis in the remote past. States that she should be able to drive with it is having a number of teeth extracted and they will not give her sedation.  Health Maintenance  Topic Date Due  . ZOSTAVAX  10/05/1995  . DEXA SCAN  10/04/2000  . INFLUENZA VACCINE  04/17/2015  . COLONOSCOPY  05/26/2023  . TETANUS/TDAP  12/15/2023  . PNEUMOCOCCAL POLYSACCHARIDE VACCINE AGE 69 AND OVER  Completed   Health Maintenance Review LIFESTYLE:    Exercise:   Less thatn 30 minutes  Tobacco/ETS:no remote in 1950s  Alcohol: per day  no Sugar beverages: Sleep: meds  Help  Drug use: no See scanned document  Hearing: Reports is good  Vision:  No limitations at present . Wears glasses see above  Safety:  Has smoke detector and wears seat belts.  No firearms. No excess sun exposure. Sees dentist regularly.  Falls:  2 in last  Months troipped on wet area   Advance directive :  Reviewed  Has one.  Memory: Felt to be good  , no concern from her or her family.  Depression: No anhedonia unusual crying or depressive symptoms is more anxious and irritable off of her Zoloft Nutrition: Eats well balanced diet; adequate calcium and vitamin D. No swallowing chewing problems.  Injury: no major injuries in the last six months.  Other healthcare providers:  Reviewed today .  Social:  Lives alone No pets.   Preventive parameters: up-to-date  Reviewed   ADLS:   There are no problems or need for assistance  driving, feeding, obtaining food, dressing, toileting and bathing, managing money using phone. She is independent.     ROS:  GEN/ HEENT: No fever, significant weight changes sweats headaches vision problems hearing changes, CV/ PULM; No chest pain shortness of breath cough,  syncope,edema  change in exercise tolerance. GI /GU: No adominal pain, vomiting, change in bowel habits. No blood in the stool. No significant GU symptoms. SKIN/HEME: ,no acute skin rashes suspicious lesions or bleeding. No lymphadenopathy, nodules, masses.  NEURO/ PSYCH:  No neurologic signs such as weakness numbness. No depression anxiety. IMM/ Allergy: No unusual infections.  Allergy .   REST of 12 system review negative except as per HPI   Past Medical History  Diagnosis Date  . Hyperlipidemia   . RLS (restless legs syndrome)   . Breast cancer     hx of left with radiation and lumpectomy  . History of colonic polyps   . GERD (gastroesophageal reflux  disease)   . History of ERCP     and sphincterotomy for abnormal lfts and dilated biliary ducts 5/05  . Pulmonary nodule   . Migraine headache   . Wears contact lenses     No family history on file.  History   Social History  . Marital Status: Widowed    Spouse Name: N/A    Number of Children: N/A  . Years of Education: N/A   Social History Main Topics  . Smoking status: Former Smoker -- 3.00 packs/day for 25 years    Types: Cigarettes    Quit date: 02/26/1994  . Smokeless tobacco: Never Used  . Alcohol Use: No  . Drug Use: No  . Sexual Activity: Not on file   Other Topics Concern  . 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                    Outpatient Encounter Prescriptions as of 10/18/2014  Medication Sig  . BIOTIN PO Take 2,000 mcg by mouth daily.  . cetirizine (ZYRTEC) 10 MG tablet Take 10 mg by mouth daily.  . Cholecalciferol (VITAMIN D3) 2000 UNITS TABS Take 1 tablet by mouth daily.  . CRESTOR 5 MG tablet take 1 tablet by mouth once daily  . dicyclomine (BENTYL) 10 MG capsule Take one po BID  . escitalopram (LEXAPRO) 5 MG tablet Take 3 tablets (15 mg total) by mouth daily.  Marland Kitchen esomeprazole (NEXIUM) 40 MG capsule Take 1 capsule (40 mg total) by mouth daily.  . fish oil-omega-3 fatty acids 1000 MG capsule Take 1 g by mouth daily.   . fluocinonide cream (LIDEX) 0.05 % Apply topically 2 (two) times daily. To itchy rashas needed, not on face  . mirtazapine (REMERON) 15 MG tablet Take 1 tablet (15 mg total) by mouth at bedtime. Can increase to 2 hs for sleep  . Multiple Vitamin (MULTIVITAMIN) tablet Take 1 tablet by mouth daily.  . Probiotic Product (CVS PROBIOTIC) CHEW Chew 2 capsules by mouth daily.  . tamoxifen (NOLVADEX) 20 MG tablet Take 1 tablet (20 mg total) by mouth daily.  . [DISCONTINUED] BIOTIN 5000 PO Take 5,000 mg by mouth every other day.   . [DISCONTINUED] mirtazapine  (REMERON) 15 MG tablet Take 1 tablet (15 mg total) by mouth at bedtime. Can increase to 2 hs for sleep  . diazepam (VALIUM) 5 MG tablet Take 1-2 tablets before procedure  . [DISCONTINUED] diclofenac sodium (VOLTAREN) 1 % GEL Apply 4 g topically 4 (four) times daily. (Patient not taking: Reported on 09/26/2014)  . [DISCONTINUED] Multiple Vitamin (MULTIVITAMIN WITH MINERALS) TABS tablet Take 1 tablet by mouth  daily.  . [DISCONTINUED] NASACORT AQ 55 MCG/ACT AERO nasal inhaler Place 2 sprays into the nose daily.    EXAM:  BP 116/70 mmHg  Temp(Src) 98.2 F (36.8 C) (Oral)  Ht 5\' 5"  (1.651 m)  Wt 162 lb 11.2 oz (73.8 kg)  BMI 27.07 kg/m2  Body mass index is 27.07 kg/(m^2).  Physical Exam: Vital signs reviewed [id number] is a well-developed well-nourished alert cooperative   who appears stated age in no acute distress. waering glasses  HEENT: normocephalic atraumatic , Eyes: PERRL EOM's full, conjunctiva clear, Nares: paten,t no deformity discharge or tenderness., Ears: no deformity EAC's clear TMs with normal landmarks. Mouth: clear OP, no lesions, edema.  Moist mucous membranes. Dentition in adequate repair. NECK: supple without masses, thyromegaly or bruits. CHEST/PULM:  Clear to auscultation and percussion breath sounds equal no wheeze , rales or rhonchi.r breast nl left thickened no masses . CV: PMI is nondisplaced, S1 S2 no gallops, murmurs, rubs. Peripheral pulses are full without delay.No JVD .  ABDOMEN: Bowel sounds normal nontender  No guard or rebound, no hepato splenomegal no CVA tenderness.  Extremtities:  No clubbing cyanosis or edema,  oa changes   NEURO:  Oriented x3, cranial nerves 3-12 appear to be intact, no obvious focal weakness,gait within normal limits no abnormal reflexes or asymmetrical SKIN: No acute rashes normal turgor, color, no bruising or petechiae. PSYCH: Oriented, good eye contact, no obvious depression anxiety, cognition and judgment appear normal. LN: no  cervical axillary inguinal adenopathy Noobvious  noted deficits in memory, attention, and speech.   Lab Results  Component Value Date   WBC 12.1* 08/08/2014   HGB 12.6 08/08/2014   HCT 38.4 08/08/2014   PLT 281 08/08/2014   GLUCOSE 98 10/18/2014   CHOL 176 10/18/2014   TRIG 164.0* 10/18/2014   HDL 73.40 10/18/2014   LDLCALC 70 10/18/2014   ALT 10 08/08/2014   AST 17 08/08/2014   NA 139 10/18/2014   K 4.6 10/18/2014   CL 105 10/18/2014   CREATININE 1.16 10/18/2014   BUN 23 10/18/2014   CO2 29 10/18/2014   TSH 3.37 10/18/2014   INR 1.00 06/29/2014   HGBA1C 6.3 12/14/2013    ASSESSMENT AND PLAN:  Discussed the following assessment and plan:  Medicare annual wellness visit, subsequent  Medication management - Plan: Basic metabolic panel, Lipid panel, TSH, T4, free, POCT urinalysis dipstick  BREAST CANCER, HX OF - Plan: Basic metabolic panel, Lipid panel, TSH, T4, free, POCT urinalysis dipstick  Adjustment disorder with anxiety - Continue increased dose Lexapro consider increase to 20 preprocedure asks for Valium discussed risk 6 pills given don't drive after taking - Plan: Basic metabolic panel, Lipid panel, TSH, T4, free, POCT urinalysis dipstick  Persistent disorder of initiating or maintaining sleep - Some better on Remeron denies side effects - Plan: Basic metabolic panel, Lipid panel, TSH, T4, free, POCT urinalysis dipstick  High risk medication use - benefit more than risk  - Plan: Basic metabolic panel, Lipid panel, TSH, T4, free, POCT urinalysis dipstick  Hyperlipidemia - Check lipid panel continue today - Plan: Basic metabolic panel, Lipid panel, TSH, T4, free, POCT urinalysis dipstick  Elevated serum creatinine - Recheck today - Plan: Basic metabolic panel, Lipid panel, TSH, T4, free, POCT urinalysis dipstick  Multiple joint pain - To see rheumatologist soon felt much better on Celebrex but then we stopped it for risk of renal interaction check today if stable  consider 2 week course  GI symptoms - Has been  on Bentyl for elimination changes is a risk medicine would not increase without seeing GI can wean off Nexium but take when she is on Celebrex - Plan: Ambulatory referral to Gastroenterology Ok to wean the ppi as tolereated  Lipids tsh a1c may be due Patient declines physical therapy today knows about balancing exercises will let us know if she wants [place] to do referral discuss risk-benefit of medications plan follow-up in about 8 weeks. Labs are pending. Prolonged visit and management today in addition to her wellness interviews. And counseling Patient Care Team: Burnis Medin, MD as PCP - General [place] Dragon, MD (Gastroenterology) Dyke Maes, OD (Optometry) Elaina Hoops, MD (Neurosurgery) [name], MD as Consulting Physician (General Surgery) Truitt Merle, MD as Consulting Physician (Hematology)  Patient Instructions  Can wean the nexium every few days and off  EXCEPT  Take if there is a day you take Celebrex to protect the stomach from bleeding.  Celebrex can be dangerous to your kidneys   If lab today is ok could use once a day for 2 weeks and then off to see if helps. Keep appt with rheumatology. Increase the lexapro to 15 mg as discussed . If not helpful we may need to increase to 20 mg . Uncertain  Advise for the bowel habit  change  The bentyl is a higher risk med sometimes and if not working  Other interventions may be in order . Plan to get appt  With dr Olevia Perches for idea . All med have risk and benefit   Good effects bad effects  . Want more benefit than risk  With your meds . Can take some time  to get to  Get  To  the right balance . Sometimes less medicine is best.   Plan ROV in  6-8 months for checking the lexapro ora as needed  Will notify you  of labs when available.   There are balance exercise . Let us know if you want a referral to physical therapy.    Fall Prevention and Home Safety Falls cause injuries and can  affect all age groups. It is possible to use preventive measures to significantly decrease the likelihood of falls. There are many simple measures which can make your home safer and prevent falls. OUTDOORS  Repair cracks and edges of walkways and driveways.  Remove high doorway thresholds.  Trim shrubbery on the main path into your home.  Have good outside lighting.  Clear walkways of tools, rocks, debris, and clutter.  Check that handrails are not broken and are securely fastened. Both sides of steps should have handrails.  Have leaves, snow, and ice cleared regularly.  Use sand or salt on walkways during winter months.  In the garage, clean up grease or oil spills. BATHROOM  Install night lights.  Install grab bars by the toilet and in the tub and shower.  Use non-skid mats or decals in the tub or shower.  Place a plastic non-slip stool in the shower to sit on, if needed.  Keep floors dry and clean up all water on the floor immediately.  Remove soap buildup in the tub or shower on a regular basis.  Secure bath mats with non-slip, double-sided rug tape.  Remove throw rugs and tripping hazards from the floors. BEDROOMS  Install night lights.  Make sure a bedside light is easy to reach.  Do not use oversized bedding.  Keep a telephone by your bedside.  Have a firm chair with  side arms to use for getting dressed.  Remove throw rugs and tripping hazards from the floor. KITCHEN  Keep handles on pots and pans turned toward the center of the stove. Use back burners when possible.  Clean up spills quickly and allow time for drying.  Avoid walking on wet floors.  Avoid hot utensils and knives.  Position shelves so they are not too high or low.  Place commonly used objects within easy reach.  If necessary, use a sturdy step stool with a grab bar when reaching.  Keep electrical cables out of the way.  Do not use floor polish or wax that makes floors slippery.  If you must use wax, use non-skid floor wax.  Remove throw rugs and tripping hazards from the floor. STAIRWAYS  Never leave objects on stairs.  Place handrails on both sides of stairways and use them. Fix any loose handrails. Make sure handrails on both sides of the stairways are as long as the stairs.  Check carpeting to make sure it is firmly attached along stairs. Make repairs to worn or loose carpet promptly.  Avoid placing throw rugs at the top or bottom of stairways, or properly secure the rug with carpet tape to prevent slippage. Get rid of throw rugs, if possible.  Have an electrician put in a light switch at the top and bottom of the stairs. OTHER FALL PREVENTION TIPS  Wear low-heel or rubber-soled shoes that are supportive and fit well. Wear closed toe shoes.  When using a stepladder, make sure it is fully opened and both spreaders are firmly locked. Do not climb a closed stepladder.  Add color or contrast paint or tape to grab bars and handrails in your home. Place contrasting color strips on first and last steps.  Learn and use mobility aids as needed. Install an electrical emergency response system.  Turn on lights to avoid dark areas. Replace light bulbs that burn out immediately. Get light switches that glow.  Arrange furniture to create clear pathways. Keep furniture in the same place.  Firmly attach carpet with non-skid or double-sided tape.  Eliminate uneven floor surfaces.  Select a carpet pattern that does not visually hide the edge of steps.  Be aware of all pets. OTHER HOME SAFETY TIPS  Set the water temperature for 120 F (48.8 C).  Keep emergency numbers on or near the telephone.  Keep smoke detectors on every level of the home and near sleeping areas. Document Released: 08/23/2002 Document Revised: 03/03/2012 Document Reviewed: 11/22/2011 [facility] Patient Information 2015 [place], [place]. This information is not intended to replace advice given to  you by your health care provider. Make sure you discuss any questions you have with your health care provider.     Standley Brooking. Leman Martinek M.D.

## 2014-10-18 NOTE — Patient Instructions (Addendum)
Can wean the nexium every few days and off  EXCEPT  Take if there is a day you take Celebrex to protect the stomach from bleeding.  Celebrex can be dangerous to your kidneys   If lab today is ok could use once a day for 2 weeks and then off to see if helps. Keep appt with rheumatology. Increase the lexapro to 15 mg as discussed . If not helpful we may need to increase to 20 mg . Uncertain  Advise for the bowel habit  change  The bentyl is a higher risk med sometimes and if not working  Other interventions may be in order . Plan to get appt  With dr Olevia Perches for idea . All med have risk and benefit   Good effects bad effects  . Want more benefit than risk  With your meds . Can take some time  to get to  Get  To  the right balance . Sometimes less medicine is best.   Plan ROV in  6-8 months for checking the lexapro ora as needed  Will notify you  of labs when available.   There are balance exercise . Let us know if you want a referral to physical therapy.    Fall Prevention and Home Safety Falls cause injuries and can affect all age groups. It is possible to use preventive measures to significantly decrease the likelihood of falls. There are many simple measures which can make your home safer and prevent falls. OUTDOORS  Repair cracks and edges of walkways and driveways.  Remove high doorway thresholds.  Trim shrubbery on the main path into your home.  Have good outside lighting.  Clear walkways of tools, rocks, debris, and clutter.  Check that handrails are not broken and are securely fastened. Both sides of steps should have handrails.  Have leaves, snow, and ice cleared regularly.  Use sand or salt on walkways during winter months.  In the garage, clean up grease or oil spills. BATHROOM  Install night lights.  Install grab bars by the toilet and in the tub and shower.  Use non-skid mats or decals in the tub or shower.  Place a plastic non-slip stool in the shower to sit on,  if needed.  Keep floors dry and clean up all water on the floor immediately.  Remove soap buildup in the tub or shower on a regular basis.  Secure bath mats with non-slip, double-sided rug tape.  Remove throw rugs and tripping hazards from the floors. BEDROOMS  Install night lights.  Make sure a bedside light is easy to reach.  Do not use oversized bedding.  Keep a telephone by your bedside.  Have a firm chair with side arms to use for getting dressed.  Remove throw rugs and tripping hazards from the floor. KITCHEN  Keep handles on pots and pans turned toward the center of the stove. Use back burners when possible.  Clean up spills quickly and allow time for drying.  Avoid walking on wet floors.  Avoid hot utensils and knives.  Position shelves so they are not too high or low.  Place commonly used objects within easy reach.  If necessary, use a sturdy step stool with a grab bar when reaching.  Keep electrical cables out of the way.  Do not use floor polish or wax that makes floors slippery. If you must use wax, use non-skid floor wax.  Remove throw rugs and tripping hazards from the floor. STAIRWAYS  Never leave objects  on stairs.  Place handrails on both sides of stairways and use them. Fix any loose handrails. Make sure handrails on both sides of the stairways are as long as the stairs.  Check carpeting to make sure it is firmly attached along stairs. Make repairs to worn or loose carpet promptly.  Avoid placing throw rugs at the top or bottom of stairways, or properly secure the rug with carpet tape to prevent slippage. Get rid of throw rugs, if possible.  Have an electrician put in a light switch at the top and bottom of the stairs. OTHER FALL PREVENTION TIPS  Wear low-heel or rubber-soled shoes that are supportive and fit well. Wear closed toe shoes.  When using a stepladder, make sure it is fully opened and both spreaders are firmly locked. Do not climb  a closed stepladder.  Add color or contrast paint or tape to grab bars and handrails in your home. Place contrasting color strips on first and last steps.  Learn and use mobility aids as needed. Install an electrical emergency response system.  Turn on lights to avoid dark areas. Replace light bulbs that burn out immediately. Get light switches that glow.  Arrange furniture to create clear pathways. Keep furniture in the same place.  Firmly attach carpet with non-skid or double-sided tape.  Eliminate uneven floor surfaces.  Select a carpet pattern that does not visually hide the edge of steps.  Be aware of all pets. OTHER HOME SAFETY TIPS  Set the water temperature for 120 F (48.8 C).  Keep emergency numbers on or near the telephone.  Keep smoke detectors on every level of the home and near sleeping areas. Document Released: 08/23/2002 Document Revised: 03/03/2012 Document Reviewed: 11/22/2011 [facility] Patient Information 2015 [place], [place]. This information is not intended to replace advice given to you by your health care provider. Make sure you discuss any questions you have with your health care provider.

## 2014-11-08 ENCOUNTER — Other Ambulatory Visit (HOSPITAL_BASED_OUTPATIENT_CLINIC_OR_DEPARTMENT_OTHER): Payer: Medicare Other

## 2014-11-08 ENCOUNTER — Telehealth: Payer: Self-pay | Admitting: Hematology

## 2014-11-08 ENCOUNTER — Encounter: Payer: Self-pay | Admitting: Hematology

## 2014-11-08 ENCOUNTER — Telehealth: Payer: Self-pay | Admitting: Family Medicine

## 2014-11-08 ENCOUNTER — Ambulatory Visit (HOSPITAL_BASED_OUTPATIENT_CLINIC_OR_DEPARTMENT_OTHER): Payer: Medicare Other | Admitting: Hematology

## 2014-11-08 VITALS — BP 126/66 | HR 75 | Temp 98.2°F | Resp 18 | Ht 65.0 in | Wt 164.1 lb

## 2014-11-08 DIAGNOSIS — Z853 Personal history of malignant neoplasm of breast: Secondary | ICD-10-CM | POA: Diagnosis not present

## 2014-11-08 DIAGNOSIS — Z170 Estrogen receptor positive status [ER+]: Secondary | ICD-10-CM | POA: Diagnosis not present

## 2014-11-08 DIAGNOSIS — D0512 Intraductal carcinoma in situ of left breast: Secondary | ICD-10-CM

## 2014-11-08 DIAGNOSIS — M25439 Effusion, unspecified wrist: Secondary | ICD-10-CM

## 2014-11-08 DIAGNOSIS — M7989 Other specified soft tissue disorders: Secondary | ICD-10-CM

## 2014-11-08 LAB — CBC WITH DIFFERENTIAL/PLATELET
BASO%: 0.5 % (ref 0.0–2.0)
BASOS ABS: 0 10*3/uL (ref 0.0–0.1)
EOS%: 3.9 % (ref 0.0–7.0)
Eosinophils Absolute: 0.3 10*3/uL (ref 0.0–0.5)
HEMATOCRIT: 38.5 % (ref 34.8–46.6)
HEMOGLOBIN: 12.5 g/dL (ref 11.6–15.9)
LYMPH#: 1.6 10*3/uL (ref 0.9–3.3)
LYMPH%: 25.4 % (ref 14.0–49.7)
MCH: 30.1 pg (ref 25.1–34.0)
MCHC: 32.5 g/dL (ref 31.5–36.0)
MCV: 92.8 fL (ref 79.5–101.0)
MONO#: 0.7 10*3/uL (ref 0.1–0.9)
MONO%: 11.3 % (ref 0.0–14.0)
NEUT#: 3.8 10*3/uL (ref 1.5–6.5)
NEUT%: 58.9 % (ref 38.4–76.8)
Platelets: 190 10*3/uL (ref 145–400)
RBC: 4.15 10*6/uL (ref 3.70–5.45)
RDW: 14.5 % (ref 11.2–14.5)
WBC: 6.4 10*3/uL (ref 3.9–10.3)

## 2014-11-08 LAB — COMPREHENSIVE METABOLIC PANEL (CC13)
ALBUMIN: 3.5 g/dL (ref 3.5–5.0)
ALT: 14 U/L (ref 0–55)
ANION GAP: 8 meq/L (ref 3–11)
AST: 23 U/L (ref 5–34)
Alkaline Phosphatase: 92 U/L (ref 40–150)
BUN: 19.7 mg/dL (ref 7.0–26.0)
CO2: 27 meq/L (ref 22–29)
Calcium: 9.2 mg/dL (ref 8.4–10.4)
Chloride: 107 mEq/L (ref 98–109)
Creatinine: 1.1 mg/dL (ref 0.6–1.1)
EGFR: 49 mL/min/{1.73_m2} — AB (ref >90)
Glucose: 100 mg/dl (ref 70–140)
Potassium: 4.7 mEq/L (ref 3.5–5.1)
Sodium: 143 mEq/L (ref 136–145)
TOTAL PROTEIN: 6.6 g/dL (ref 6.4–8.3)
Total Bilirubin: 0.32 mg/dL (ref 0.20–1.20)

## 2014-11-08 MED ORDER — ANASTROZOLE 1 MG PO TABS: 1.0000 mg | ORAL_TABLET | Freq: Every day | ORAL | Status: DC

## 2014-11-08 NOTE — Telephone Encounter (Signed)
Needs another referral to rheumatology

## 2014-11-08 NOTE — Progress Notes (Signed)
[facility] [place] NOTE  Patient Care Team: Burnis Medin, MD as PCP - General [place] Dragon, MD (Gastroenterology) Dyke Maes, OD (Optometry) Elaina Hoops, MD (Neurosurgery) [name], MD as Consulting Physician (General Surgery) Truitt Merle, MD as Consulting Physician (Hematology)  Oncology History  1.  History of left breast cancer   Diagnosed in 1995, s/p lumpectomy and 5 years Tamoxifen. Records not available     2.  Left breast DCIS  Diagnosed  On 06/08/2014      CHIEF COMPLAINTS:  Follow up left breast DCIS   HISTORY OF PRESENTING ILLNESS:  Jeanne Tran 81 y.o. female is here because of recent diagnosis of left breast DCIS.   She is referred by Dr. Zella Richer because of a new diagnosis of left breast DCIS. She has a previous history of left breast cancer in 1995 and was treated with lumpectomy, axillary lymph node dissection, radiation, and 7 years tamoxifen. She has been followed by yearly mammogram. She was noted to have a new mass in the left breast on recent mammography at the 12 to 1:00 position, middle depth. Image guided biopsy was performed with the above pathology. The tumor is ER and PR positive.   She was seen by Dr. Zella Richer. She declined mastectomy, underwent lumpectomy on 07/01/2014. The surgery went well without any complications. She has recovered well from the surgery, except mild fatigue after the surgery. She is able to function well at home. She denies any pain, dyspnea, abdominal discomfort, or any other symptoms.  INTERIM HISTORY: Jeanne Tran returns for follow-up. She started tamoxifen after I saw her 3 months ago. She also switched her Zoloft to Effexor due to the interaction with tamoxifen. However, she feels Effexor is not helping her mood much, she has anxiety and mood fluctuation. Her care physician Dr. Regis Bill did increase her dose of Effexor but did not help. She otherwise is doing well. No noticeable side effects from  tamoxifen. She also reports some worsening swelling and pain of her finger joints in the past few months. Dr. Regis Bill it take her off Celebrex due to her mild renal failure. She was referred to see a rheumatologist, but missed her appointment and she will reschedule soon.  SUMMARY OF ONCOLOGIC HISTORY: Oncology History   Diagnosed in 1995, s/p lumpectomy and 5 years Tamoxifen. Records not available      History of left breast cancer    Initial Diagnosis History of left breast cancer    In terms of breast cancer risk profile:  She menarched at early age of 63 and went to menopause at age mind 79's.   She had 3 pregnancy, her first child was born at age 60.   She did not breast-fed her child.  She did received birth control pills for several years.  She was on hormone replacement therapy for 12-13 years.  She has no family history of Breast/GYN/GI cancer    MEDICAL HISTORY:  Past Medical History  Diagnosis Date  . Hyperlipidemia   . RLS (restless legs syndrome)   . Breast cancer     hx of left with radiation and lumpectomy  . History of colonic polyps   . GERD (gastroesophageal reflux disease)   . History of ERCP     and sphincterotomy for abnormal lfts and dilated biliary ducts 5/05  . Pulmonary nodule   . Migraine headache   . Wears contact lenses     SURGICAL HISTORY: Past Surgical History  Procedure Laterality Date  .  Ercp      for abnormal lfts and dilated biliary ducts 5/05  . Sphincterotomy      for abnormal lfts and dilated biliary ducts 5/05  . Lymph node biopsy    . Cystocele repair    . Appendectomy    . Hemorrhoid surgery    . Tonsillectomy    . Cervical spine surgery  3/03  . Gallbladder duct open  2006  . Cholecystectomy  2012  . Orthoscopic rt knee    . Hysteroscopy    . Complete hysterectomy    . Breast lumpectomy  1995    lt lumpsnbx  . Abdominal hysterectomy      bso  . Breast lumpectomy with radioactive seed localization Left 07/01/2014     Procedure: LEFT BREAST LUMPECTOMY WITH RADIOACTIVE SEED LOCALIZATION;  Surgeon: [name], MD;  Location: [facility];  Service: General;  Laterality: Left;    SOCIAL HISTORY: History   Social History  . Marital Status: Widowed    Spouse Name: N/A    Number of Children: N/A  . Years of Education: N/A  She lives alone at home.   Occupational History  . Not on file.   Social History Main Topics  . Smoking status: Former Smoker -- 3.00 packs/day for 25 years    Types: Cigarettes    Quit date: 02/26/1994  . Smokeless tobacco: Never Used  . Alcohol Use: No  . Drug Use: No  . 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                    FAMILY HISTORY: No family history of malignancy.  ALLERGIES:  is allergic to codeine.  MEDICATIONS:  Current Outpatient Prescriptions  Medication Sig Dispense Refill  . BIOTIN PO Take 2,000 mcg by mouth daily.    . cetirizine (ZYRTEC) 10 MG tablet Take 10 mg by mouth daily.    . Cholecalciferol (VITAMIN D3) 2000 UNITS TABS Take 1 tablet by mouth daily.    . CRESTOR 5 MG tablet take 1 tablet by mouth once daily 90 tablet 1  . diazepam (VALIUM) 5 MG tablet Take 1-2 tablets before procedure 6 tablet 0  . dicyclomine (BENTYL) 10 MG capsule Take one po BID 180 capsule 0  . escitalopram (LEXAPRO) 5 MG tablet Take 3 tablets (15 mg total) by mouth daily. 270 tablet 3  . esomeprazole (NEXIUM) 40 MG capsule Take 1 capsule (40 mg total) by mouth daily. 90 capsule 1  . fish oil-omega-3 fatty acids 1000 MG capsule Take 1 g by mouth daily.     . fluocinonide cream (LIDEX) 0.05 % Apply topically 2 (two) times daily. To itchy rashas needed, not on face 30 g 2  . mirtazapine (REMERON) 15 MG tablet Take 1 tablet (15 mg total) by mouth at bedtime. Can increase to 2 hs for sleep 180 tablet 1  . Multiple Vitamin (MULTIVITAMIN) tablet Take  1 tablet by mouth daily.    . Probiotic Product ([employer] PROBIOTIC) CHEW Chew 2 capsules by mouth daily.    . tamoxifen (NOLVADEX) 20 MG tablet Take 1 tablet (20 mg total) by mouth daily. 90 tablet 3   No current facility-administered medications for this visit.    REVIEW OF SYSTEMS:   Constitutional:  Denies fevers, chills or abnormal night sweats Eyes: Denies blurriness of vision, double vision or watery eyes Ears, nose, mouth, throat, and face: Denies mucositis or sore throat Respiratory: Denies cough, dyspnea or wheezes Cardiovascular: Denies palpitation, chest discomfort or lower extremity swelling Gastrointestinal:  Denies nausea, heartburn or change in bowel habits Skin: Denies abnormal skin rashes Lymphatics: Denies new lymphadenopathy or easy bruising Neurological:Denies numbness, tingling or new weaknesses Behavioral/Psych: Mood is stable, no new changes  All other systems were reviewed with the patient and are negative.  PHYSICAL EXAMINATION: ECOG PERFORMANCE STATUS: 0 - Asymptomatic  Filed Vitals:   11/08/14 1252  BP: 126/66  Pulse: 75  Temp: 98.2 F (36.8 C)  Resp: 18   Filed Weights   11/08/14 1252  Weight: 164 lb 1.6 oz (74.435 kg)    GENERAL:alert, no distress and comfortable SKIN: skin color, texture, turgor are normal, no rashes or significant lesions EYES: normal, conjunctiva are pink and non-injected, sclera clear OROPHARYNX:no exudate, no erythema and lips, buccal mucosa, and tongue normal  NECK: supple, thyroid normal size, non-tender, without nodularity LYMPH:  no palpable lymphadenopathy in the cervical, axillary or inguinal LUNGS: clear to auscultation and percussion with normal breathing effort HEART: regular rate & rhythm and no murmurs and no lower extremity edema ABDOMEN:abdomen soft, non-tender and normal bowel sounds Musculoskeletal:no cyanosis of digits and no clubbing  PSYCH: alert & oriented x 3 with fluent speech NEURO: no focal  motor/sensory deficits Breasts: Breast inspection showed a positive surgical scar at the left upper outer quadrant, well healed, no surrounding skin erythema or discharge. No skin change or nipple discharge. (+) For nests and firmness under the surgical scar, no palpable mass. Palpation of the right breasts and axilla revealed no obvious mass that I could appreciate.  LABORATORY DATA:  I have reviewed the data as listed Lab Results  Component Value Date   WBC 6.4 11/08/2014   HGB 12.5 11/08/2014   HCT 38.5 11/08/2014   MCV 92.8 11/08/2014   PLT 190 11/08/2014   Lab Results  Component Value Date   NA 143 11/08/2014   K 4.7 11/08/2014   CL 105 10/18/2014   CO2 27 11/08/2014   Pathology report 07/01/2014 FINAL DIAGNOSIS Diagnosis 1. Breast, lumpectomy, left - DUCTAL CARCINOMA IN SITU WITH CALCIFICATIONS, LOW GRADE, SPANNING 1.8 CM. - THE SURGICAL RESECTION MARGINS ARE NEGATIVE FOR CARCINOMA. - SEE ONCOLOGY TABLE BELOW. 2. Breast, excision, medial margin left - FIBROCYSTIC CHANGES. - THERE IS NO EVIDENCE OF MALIGNANCY. - SEE COMMENT. 3. Breast, excision, superior margin left - FIBROCYSTIC CHANGES. - THERE IS NO EVIDENCE OF MALIGNANCY. - SEE COMMENT. 4. Breast, excision, lateral margin left - FIBROCYSTIC CHANGES. - THERE IS NO EVIDENCE OF MALIGNANCY. - SEE COMMENT. 5. Breast, excision, inferior margin left - FIBROCYSTIC CHANGES. - THERE IS NO EVIDENCE OF MALIGNANCY. - SEE COMMENT. Microscopic Comment 1. BREAST, IN SITU CARCINOMA Specimen, including laterality: Left breast Procedure (include lymph node sampling sentinel-non-sentinel: Lumpectomy and additional margin resection x 4 Grade of carcinoma: Low grade Necrosis: Not identified. Estimated tumor size: (gross measurement): 1.8 cm Treatment effect: N/A Distance to closest margin: Greater than 0.2 cm to all margins Breast prognostic profile: [phone] 1 of 3 FINAL for DEAZIA, PONCEDELEON  [id number]) Microscopic Comment(continued) Estrogen receptor: 100%, strong staining intensity Progesterone receptor: 100%, strong staining intensity TNM: pTis, pNX (JK:kh 07-04-14) 2. -5. The surgical resection  RADIOGRAPHIC STUDIES: MRI of bilateral breast 07-05-2014 IMPRESSION: No evidence of residual left breast malignancy. No abnormal enhancement  is seen to suggest malignancy in either breast.  There are the expected post biopsy changes including low level enhancement in the upper left breast.  RECOMMENDATION: Continue with current treatment plan for the newly diagnosed left breast DCIS.  BI-RADS CATEGORY 6: Known biopsy-proven malignancy.   ASSESSMENT:  left breast DCIS s/p lumpectomy, ER/PR positive  PLAN:  #1 left breast DCIS, ER/PR positive The patient had early stage disease. She is considered to be cured by surgery alone.  Any form of adjuvant treatment is for prevention of disease recurrence.  Due to her prior left breast radiation history, no adjuvant radiation at this time I recommended adjuvant tamoxifen for 5 years to reduce future risks of breast cancer. She tolerated tamoxifen well before, and I anticipate she will do well again this time. The side effects of tamoxifen which includes but not limited to, hot flash, nausea, fatigue, risk of endometrial cancer (she had  Hysterectomy)  and DVT, which were discussed with patient in details.  -She tolerated tamoxifen quite well, but she's not happy that her Zoloft was switched to Effexor. -I discussed the other option of aromatase inhibitor, such as anastrozole. Side effects of musculoskeletal stiffness and pain, osteoporosis, hot flash, etc., was discussed with patient in details. She agrees to switch. It does not have interaction with SSRI, and she can go back to Zoloft. I did send a prescription of anastrozole to her pharmacy today. -She would like to wait until she sees a rheumatologist for her finger joints issue.  If you have no major issues, she is going to switch tamoxifen to anastrozole afterwards.   #2 Bone health  No history of osteoporosis. I recommend her take calcium 1 g a day and vitamin D. -Bone density scan before she starts anastrozole.  Plan 1. Will switch tamoxifen to arimidex after your rheumatology visit, so you can go back to zoloft 2. RTC in 3 month with a bone density scan and lab    All questions were answered. The patient knows to call the clinic with any problems, questions or concerns. I spent 20 minutes counseling the patient face to face. The total time spent in the appointment was 25 minutes and more than 50% was on counseling.     Truitt Merle, MD 11/08/2014 1:06 PM

## 2014-11-08 NOTE — Telephone Encounter (Signed)
Pt confirmed labs/ov per 02/23 POF, gave pt AVS... Cherylann Banas, sent msg to MD Medicare wont' pay for the diagnosis listed for Bone Density

## 2014-11-09 ENCOUNTER — Telehealth: Payer: Self-pay | Admitting: Hematology

## 2014-11-09 NOTE — Telephone Encounter (Signed)
Referral placed in the system. 

## 2014-11-09 NOTE — Telephone Encounter (Signed)
S/w pt confirming Bone Density Scan D/T .Marland Kitchen... KJ

## 2014-11-15 ENCOUNTER — Ambulatory Visit
Admission: RE | Admit: 2014-11-15 | Discharge: 2014-11-15 | Disposition: A | Discharge status: Home / Self Care | Payer: Medicare Other | Source: Ambulatory Visit | Attending: Hematology | Admitting: Hematology

## 2014-11-15 DIAGNOSIS — Z780 Asymptomatic menopausal state: Secondary | ICD-10-CM | POA: Diagnosis not present

## 2014-11-15 DIAGNOSIS — M85852 Other specified disorders of bone density and structure, left thigh: Secondary | ICD-10-CM | POA: Diagnosis not present

## 2014-11-15 DIAGNOSIS — D0512 Intraductal carcinoma in situ of left breast: Secondary | ICD-10-CM

## 2014-11-18 ENCOUNTER — Encounter: Payer: Self-pay | Admitting: Internal Medicine

## 2014-11-23 ENCOUNTER — Telehealth: Payer: Self-pay | Admitting: *Deleted

## 2014-11-23 DIAGNOSIS — H52219 Irregular astigmatism, unspecified eye: Secondary | ICD-10-CM | POA: Diagnosis not present

## 2014-11-23 DIAGNOSIS — H04123 Dry eye syndrome of bilateral lacrimal glands: Secondary | ICD-10-CM | POA: Diagnosis not present

## 2014-11-23 DIAGNOSIS — H16103 Unspecified superficial keratitis, bilateral: Secondary | ICD-10-CM | POA: Diagnosis not present

## 2014-11-23 DIAGNOSIS — H2513 Age-related nuclear cataract, bilateral: Secondary | ICD-10-CM | POA: Diagnosis not present

## 2014-11-23 NOTE — Telephone Encounter (Signed)
Spoke with pt and informed pt re:  Per Dr. Burr Medico, bone density showed osteopenia.  Pt voiced understanding and stated she has been taking Calcium as instructed by md. Confirmed next office appts for 02/06/15.

## 2014-11-24 ENCOUNTER — Other Ambulatory Visit: Payer: Self-pay | Admitting: Internal Medicine

## 2014-11-25 ENCOUNTER — Other Ambulatory Visit: Payer: Self-pay | Admitting: *Deleted

## 2014-11-25 MED ORDER — ANASTROZOLE 1 MG PO TABS: 1.0000 mg | ORAL_TABLET | Freq: Every day | ORAL | Status: DC

## 2014-11-25 NOTE — Telephone Encounter (Signed)
Would like a 90 day supply per her insurance company.

## 2014-11-25 NOTE — Telephone Encounter (Signed)
Sent to the pharmacy by e-scribe. 

## 2014-11-30 ENCOUNTER — Encounter: Payer: Self-pay | Admitting: General Surgery

## 2014-11-30 DIAGNOSIS — C50412 Malignant neoplasm of upper-outer quadrant of left female breast: Secondary | ICD-10-CM | POA: Diagnosis not present

## 2014-11-30 NOTE — Progress Notes (Signed)
Patient ID: Jeanne Tran, female   DOB: Jan 21, 1934, 81 y.o.   MRN: [id number] Jeanne Tran 11/30/2014 12:21 PM Location: [facility] Patient #: [id number] DOB: 1933-11-19 Widowed / Language: Jeanne Tran / Race: White Female History of Present Illness Jeanne Hollingshead MD; 11/30/2014 12:45 PM) The patient is a 81 year old female    Note:Procedure: Left partial mastectomy  Date: 07/01/14  Pathology: Low grade DCIS, ER/PR positive, margins clear  History: She is here for long-term follow-up. She is now taking anastrozole. She is seeing Dr. Burr Medico from oncology. She has some swelling in the left breast.  Exam: General- Is in NAD.  Right breast-soft with no masses  Left breast-well healed scar with fullness present. [place] is c/w a seroma  Other Problems Jeanne Tran, Jeanne Tran; 11/30/2014 12:26 PM) Migraine Headache Melanoma Hypercholesterolemia MALIGNANT NEOPLASM OF UPPER-OUTER QUADRANT OF LEFT FEMALE BREAST (174.4  C50.412) Unspecified Diagnosis Oophorectomy Left. Breast Cancer Arthritis Anxiety Disorder Hemorrhoids Gastroesophageal Reflux Disease Gastric Ulcer  Past Surgical History Jeanne Tran, Jeanne Tran; 11/30/2014 12:26 PM) Knee Surgery Left. Hysterectomy (not due to cancer) - Complete Oral Surgery Tonsillectomy Spinal Surgery - Neck Breast Biopsy Left. Appendectomy Breast Mass; Local Excision Left. Hemorrhoidectomy Gallbladder Surgery - Laparoscopic  Diagnostic Studies History Jeanne Tran, Jeanne Tran; 11/30/2014 12:26 PM) Mammogram within last year  Allergies Jeanne Tran, Jeanne Tran; 11/30/2014 12:26 PM) Codeine Phosphate *ANALGESICS - OPIOID*  Medication History Jeanne Tran, CMA; 11/30/2014 12:26 PM) Zoloft (50MG  Tablet, Oral daily) Active. (Pt not sure of exact mg dose.) Medications Reconciled Ambien (10MG  Tablet, Oral qhs) Active. Biotin (5000MCG Tablet, Oral daily) Active. ZyrTEC Allergy (10MG  Tablet, Oral prn)  Active. Bentyl (10MG  Capsule, Oral daily) Active. Vagifem (10MCG Tablet, Vaginal three times a week) Active. Lidex (0.05% Cream, External prn) Active. NexIUM (40MG  Capsule DR, Oral daily) Active. Nasacort (55MCG/ACT Inhaler, Nasal prn) Active. Multivitamin (Oral daily) Active.    Vitals Jeanne Tran CMA; 11/30/2014 12:27 PM) 11/30/2014 12:26 PM Weight: 162 lb Height: 65in Body Surface Area: 1.84 m Body Mass Index: 26.96 kg/m Temp.: 97.73F(Oral)  Pulse: 68 (Regular)  Resp.: 18 (Unlabored)  BP: 132/80 (Sitting, Left Arm, Standard)     Assessment & Plan Jeanne Hollingshead MD; 11/30/2014 12:45 PM)  MALIGNANT NEOPLASM OF UPPER-OUTER QUADRANT OF LEFT FEMALE BREAST (174.4  C50.412) Impression: She has a moderate to large seroma proven by ultrasound. This is not uncommon in people who've had previous radiation therapy as she has had. I reassured her.  Plan: Return visit 3 months.  Jeanne Confer, MD

## 2014-12-01 DIAGNOSIS — H2511 Age-related nuclear cataract, right eye: Secondary | ICD-10-CM | POA: Diagnosis not present

## 2014-12-06 ENCOUNTER — Other Ambulatory Visit: Payer: Self-pay | Admitting: Internal Medicine

## 2014-12-06 NOTE — Telephone Encounter (Signed)
Sent to the pharmacy by e-scribe.  Pt will see Dr. Olevia Perches on 01/10/15

## 2014-12-14 DIAGNOSIS — R5383 Other fatigue: Secondary | ICD-10-CM | POA: Diagnosis not present

## 2014-12-14 DIAGNOSIS — M2550 Pain in unspecified joint: Secondary | ICD-10-CM | POA: Diagnosis not present

## 2014-12-14 DIAGNOSIS — M150 Primary generalized (osteo)arthritis: Secondary | ICD-10-CM | POA: Diagnosis not present

## 2014-12-14 DIAGNOSIS — M064 Inflammatory polyarthropathy: Secondary | ICD-10-CM | POA: Diagnosis not present

## 2014-12-21 DIAGNOSIS — H1712 Central corneal opacity, left eye: Secondary | ICD-10-CM | POA: Diagnosis not present

## 2014-12-21 DIAGNOSIS — H2512 Age-related nuclear cataract, left eye: Secondary | ICD-10-CM | POA: Diagnosis not present

## 2014-12-21 DIAGNOSIS — Z961 Presence of intraocular lens: Secondary | ICD-10-CM | POA: Diagnosis not present

## 2014-12-21 DIAGNOSIS — H16102 Unspecified superficial keratitis, left eye: Secondary | ICD-10-CM | POA: Diagnosis not present

## 2015-01-04 DIAGNOSIS — M545 Low back pain: Secondary | ICD-10-CM | POA: Diagnosis not present

## 2015-01-04 DIAGNOSIS — M0609 Rheumatoid arthritis without rheumatoid factor, multiple sites: Secondary | ICD-10-CM | POA: Diagnosis not present

## 2015-01-04 DIAGNOSIS — M150 Primary generalized (osteo)arthritis: Secondary | ICD-10-CM | POA: Diagnosis not present

## 2015-01-04 DIAGNOSIS — C50919 Malignant neoplasm of unspecified site of unspecified female breast: Secondary | ICD-10-CM | POA: Diagnosis not present

## 2015-01-10 ENCOUNTER — Ambulatory Visit (INDEPENDENT_AMBULATORY_CARE_PROVIDER_SITE_OTHER): Payer: Medicare Other | Admitting: Internal Medicine

## 2015-01-10 ENCOUNTER — Encounter: Payer: Self-pay | Admitting: Internal Medicine

## 2015-01-10 VITALS — BP 124/62 | HR 84 | Ht 65.0 in | Wt 162.0 lb

## 2015-01-10 DIAGNOSIS — K589 Irritable bowel syndrome without diarrhea: Secondary | ICD-10-CM

## 2015-01-10 DIAGNOSIS — R197 Diarrhea, unspecified: Secondary | ICD-10-CM

## 2015-01-10 MED ORDER — DICYCLOMINE HCL 20 MG PO TABS: 20.0000 mg | ORAL_TABLET | Freq: Two times a day (BID) | ORAL | Status: DC

## 2015-01-10 NOTE — Progress Notes (Signed)
Jeanne Tran 11-Oct-1933 [id number]  Note: This dictation was prepared with Dragon digital system. Any transcriptional errors that result from this procedure are unintentional.   History of Present Illness: This is a 81 year old white female with irritable bowel syndrome with predominant diarrhea, last office visit August 2014. She has been under good control with Bentyl 10 mg twice a day till last several months , now c/o  recurrence of urgency and frequency which starts usually in the mornings and calms down in the afternoon. Colonoscopy is in 2003, June 2006 and in September 2014 showed moderately severe diverticulosis of the left colon. Random biopsies showed no evidence of microscopic colitis. Patient underwent laparoscopic cholecystectomy in January 2012. She has mildly dilated common bile duct. ERCP with sphincterotomy was done in April 2015 by DR Fuller Plan. She has history of breast cancer currently taking Arimidex. She also takes Metamucil 1 heaping teaspoon daily. She describes her stool as soft fragmented and urgent. There has been no bleeding.     Past Medical History  Diagnosis Date  . Hyperlipidemia   . RLS (restless legs syndrome)   . Breast cancer     hx of left with radiation and lumpectomy  . History of colonic polyps   . GERD (gastroesophageal reflux disease)   . History of ERCP     and sphincterotomy for abnormal lfts and dilated biliary ducts 5/05  . Pulmonary nodule   . Migraine headache   . Wears contact lenses     Past Surgical History  Procedure Laterality Date  . Ercp      for abnormal lfts and dilated biliary ducts 5/05  . Sphincterotomy      for abnormal lfts and dilated biliary ducts 5/05  . Lymph node biopsy    . Cystocele repair    . Appendectomy    . Hemorrhoid surgery    . Tonsillectomy    . Cervical spine surgery  3/03  . Gallbladder duct open  2006  . Cholecystectomy  2012  . Orthoscopic rt knee    . Hysteroscopy    . Complete hysterectomy     . Breast lumpectomy  1995    lt lumpsnbx  . Abdominal hysterectomy      bso  . Breast lumpectomy with radioactive seed localization Left 07/01/2014    Procedure: LEFT BREAST LUMPECTOMY WITH RADIOACTIVE SEED LOCALIZATION;  Surgeon: [name], MD;  Location: [facility];  Service: General;  Laterality: Left;    Allergies  Allergen Reactions  . Codeine     REACTION: Intolerance w/ pain meds not cough syrup. Codeine makes her bounce off the walls.    Family history and social history have been reviewed.  Review of Systems: Stable weight. Negative for rectal bleeding. Denies nausea vomiting  The remainder of the 10 point ROS is negative except as outlined in the H&P  Physical Exam: General Appearance Well developed, in no distress Eyes  Non icteric  HEENT  Non traumatic, normocephalic  Mouth No lesion, tongue papillated, no cheilosis Neck Supple without adenopathy, thyroid not enlarged, no carotid bruits, no JVD Lungs Clear to auscultation bilaterally COR Normal S1, normal S2, regular rhythm, no murmur, quiet precordium Abdomen Soft, nontender. Postcholecystectomy scars. Normoactive bowel sounds. Liver edge at costal margin  Rectal Normal rectal sphincter tone. No external hemorrhoids. Soft Hemoccult negative stool  Extremities  No pedal edema Skin No lesions Neurological Alert and oriented x 3 Psychological Normal mood and affect  Assessment and Plan:   81 year old  white female with irritable bowel syndrome with predominant diarrhea which has been exacerbated  in last several months. We have discussed increasing Bentyl from 10 mg to 20 mg twice a day. As an alternative I suggested starting Robinul Forte likely not  covered by her insurance, so she prefers to stay with the Bentyl. 20 mg She will continue Metamucil 1 heaping teaspoon daily. If her medications don't control the symptoms, consider switching to another antispasmodic or possibly adding a colestipol 2  g daily. She is up-to-date on colonoscopy    Delfin Edis 01/10/2015

## 2015-01-10 NOTE — Patient Instructions (Addendum)
We are sending in your prescription to your pharmacy Dr Regis Bill

## 2015-02-02 ENCOUNTER — Other Ambulatory Visit: Payer: Self-pay | Admitting: Family Medicine

## 2015-02-02 MED ORDER — AMBIEN 10 MG PO TABS: 10.0000 mg | ORAL_TABLET | Freq: Every evening | ORAL | Status: DC | PRN

## 2015-02-02 MED ORDER — MIRTAZAPINE 15 MG PO TABS: 15.0000 mg | ORAL_TABLET | Freq: Every day | ORAL | Status: DC

## 2015-02-02 NOTE — Telephone Encounter (Signed)
Informed the pt that she may pick up her prescriptions from the pharmacy.  Asked about medications from the rheumatologist and was told there are no medications being prescribed from rheum.

## 2015-02-02 NOTE — Telephone Encounter (Signed)
Ok to refill both ambien x 3 remeron as is    Also update her med list  From seeing the rheumatologist.

## 2015-02-06 ENCOUNTER — Ambulatory Visit (HOSPITAL_BASED_OUTPATIENT_CLINIC_OR_DEPARTMENT_OTHER): Payer: Medicare Other | Admitting: Hematology

## 2015-02-06 ENCOUNTER — Other Ambulatory Visit (HOSPITAL_BASED_OUTPATIENT_CLINIC_OR_DEPARTMENT_OTHER): Payer: Medicare Other

## 2015-02-06 ENCOUNTER — Encounter: Payer: Self-pay | Admitting: Hematology

## 2015-02-06 ENCOUNTER — Telehealth: Payer: Self-pay | Admitting: Hematology

## 2015-02-06 VITALS — BP 136/83 | HR 72 | Temp 98.2°F | Resp 19 | Ht 65.0 in | Wt 163.2 lb

## 2015-02-06 DIAGNOSIS — Z853 Personal history of malignant neoplasm of breast: Secondary | ICD-10-CM

## 2015-02-06 DIAGNOSIS — M8580 Other specified disorders of bone density and structure, unspecified site: Secondary | ICD-10-CM

## 2015-02-06 DIAGNOSIS — D0512 Intraductal carcinoma in situ of left breast: Secondary | ICD-10-CM

## 2015-02-06 LAB — CBC WITH DIFFERENTIAL/PLATELET
BASO%: 0.8 % (ref 0.0–2.0)
BASOS ABS: 0.1 10*3/uL (ref 0.0–0.1)
EOS%: 5.2 % (ref 0.0–7.0)
Eosinophils Absolute: 0.3 10*3/uL (ref 0.0–0.5)
HCT: 40.1 % (ref 34.8–46.6)
HEMOGLOBIN: 13.2 g/dL (ref 11.6–15.9)
LYMPH%: 32.7 % (ref 14.0–49.7)
MCH: 30.4 pg (ref 25.1–34.0)
MCHC: 32.9 g/dL (ref 31.5–36.0)
MCV: 92.4 fL (ref 79.5–101.0)
MONO#: 0.7 10*3/uL (ref 0.1–0.9)
MONO%: 11.2 % (ref 0.0–14.0)
NEUT%: 50.1 % (ref 38.4–76.8)
NEUTROS ABS: 3.2 10*3/uL (ref 1.5–6.5)
PLATELETS: 208 10*3/uL (ref 145–400)
RBC: 4.34 10*6/uL (ref 3.70–5.45)
RDW: 14.5 % (ref 11.2–14.5)
WBC: 6.5 10*3/uL (ref 3.9–10.3)
lymph#: 2.1 10*3/uL (ref 0.9–3.3)

## 2015-02-06 LAB — COMPREHENSIVE METABOLIC PANEL (CC13)
ALBUMIN: 3.8 g/dL (ref 3.5–5.0)
ALT: 15 U/L (ref 0–55)
ANION GAP: 12 meq/L — AB (ref 3–11)
AST: 23 U/L (ref 5–34)
Alkaline Phosphatase: 182 U/L — ABNORMAL HIGH (ref 40–150)
BUN: 17 mg/dL (ref 7.0–26.0)
CO2: 26 meq/L (ref 22–29)
Calcium: 9.6 mg/dL (ref 8.4–10.4)
Chloride: 105 mEq/L (ref 98–109)
Creatinine: 1.2 mg/dL — ABNORMAL HIGH (ref 0.6–1.1)
EGFR: 44 mL/min/{1.73_m2} — ABNORMAL LOW (ref >90)
Glucose: 90 mg/dl (ref 70–140)
Potassium: 4.2 mEq/L (ref 3.5–5.1)
Sodium: 142 mEq/L (ref 136–145)
TOTAL PROTEIN: 6.9 g/dL (ref 6.4–8.3)
Total Bilirubin: 0.3 mg/dL (ref 0.20–1.20)

## 2015-02-06 NOTE — Progress Notes (Signed)
[facility] NOTE  Patient Care Team: Burnis Medin, MD as PCP - General [place] Dragon, MD (Gastroenterology) Dyke Maes, OD (Optometry) Kary Kos, MD (Neurosurgery) [name], MD as Consulting Physician (General Surgery) Truitt Merle, MD as Consulting Physician (Hematology)  Oncology History  1.  History of left breast cancer   Diagnosed in 1995, s/p lumpectomy and 5 years Tamoxifen. Records not available     2.  Left breast DCIS  Diagnosed  On 06/08/2014      CHIEF COMPLAINTS:  Follow up left breast DCIS   HISTORY OF PRESENTING ILLNESS:  Jeanne Tran 81 y.o. female is here because of recent diagnosis of left breast DCIS.   She is referred by Dr. Zella Richer because of a new diagnosis of left breast DCIS. She has a previous history of left breast cancer in 1995 and was treated with lumpectomy, axillary lymph node dissection, radiation, and 7 years tamoxifen. She has been followed by yearly mammogram. She was noted to have a new mass in the left breast on recent mammography at the 12 to 1:00 position, middle depth. Image guided biopsy was performed with the above pathology. The tumor is ER and PR positive.   She was seen by Dr. Zella Richer. She declined mastectomy, underwent lumpectomy on 07/01/2014. The surgery went well without any complications. She has recovered well from the surgery, except mild fatigue after the surgery. She is able to function well at home. She denies any pain, dyspnea, abdominal discomfort, or any other symptoms.  In terms of breast cancer risk profile:  She menarched at early age of 72 and went to menopause at age mind 71's.   She had 3 pregnancy, her first child was born at age 60.   She did not breast-fed her child.  She did received birth control pills for several years.  She was on hormone replacement therapy for 12-13 years.  She has no family history of Breast/GYN/GI cancer   INTERIM HISTORY: Gypsie returns for  follow-up. She switched to anastrozaole 3 months ago, and tolerated very well. She denies significant hot flash, joint or muscular discomfort or other new symptoms. She is back to zoloft now, and doing well on that.    MEDICAL HISTORY:  Past Medical History  Diagnosis Date  . Hyperlipidemia   . RLS (restless legs syndrome)   . Breast cancer     hx of left with radiation and lumpectomy  . History of colonic polyps   . GERD (gastroesophageal reflux disease)   . History of ERCP     and sphincterotomy for abnormal lfts and dilated biliary ducts 5/05  . Pulmonary nodule   . Migraine headache   . Wears contact lenses     SURGICAL HISTORY: Past Surgical History  Procedure Laterality Date  . Ercp      for abnormal lfts and dilated biliary ducts 5/05  . Sphincterotomy      for abnormal lfts and dilated biliary ducts 5/05  . Lymph node biopsy    . Cystocele repair    . Appendectomy    . Hemorrhoid surgery    . Tonsillectomy    . Cervical spine surgery  3/03  . Gallbladder duct open  2006  . Cholecystectomy  2012  . Orthoscopic rt knee    . Hysteroscopy    . Complete hysterectomy    . Breast lumpectomy  1995    lt lumpsnbx  . Abdominal hysterectomy      bso  .  Breast lumpectomy with radioactive seed localization Left 07/01/2014    Procedure: LEFT BREAST LUMPECTOMY WITH RADIOACTIVE SEED LOCALIZATION;  Surgeon: [name], MD;  Location: [facility];  Service: General;  Laterality: Left;    SOCIAL HISTORY: History   Social History  . Marital Status: Widowed    Spouse Name: N/A    Number of Children: N/A  . Years of Education: N/A  She lives alone at home.   Occupational History  . Not on file.   Social History Main Topics  . Smoking status: Former Smoker -- 3.00 packs/day for 25 years    Types: Cigarettes    Quit date: 02/26/1994  . Smokeless tobacco: Never Used  . Alcohol Use: No  . Drug Use: No  . Sexual Activity: Not on file           Social History Narrative   Retired   Single widowed    International aid/development worker   HH of 1   No   No [place].                    FAMILY HISTORY: No family history of malignancy.  ALLERGIES:  is allergic to codeine.  MEDICATIONS:  Current Outpatient Prescriptions  Medication Sig Dispense Refill  . AMBIEN 10 MG tablet Take 1 tablet (10 mg total) by mouth at bedtime as needed for sleep (brand only). 30 tablet 2  . anastrozole (ARIMIDEX) 1 MG tablet Take 1 tablet (1 mg total) by mouth daily. 90 tablet 1  . BIOTIN PO Take 2,000 mcg by mouth daily.    . cetirizine (ZYRTEC) 10 MG tablet Take 10 mg by mouth daily.    . Cholecalciferol (VITAMIN D3) 2000 UNITS TABS Take 1 tablet by mouth daily.    . CRESTOR 5 MG tablet take 1 tablet by mouth once daily 90 tablet 2  . diazepam (VALIUM) 5 MG tablet Take 1-2 tablets before procedure 6 tablet 0  . dicyclomine (BENTYL) 20 MG tablet Take 1 tablet (20 mg total) by mouth 2 (two) times daily. 60 tablet 6  . esomeprazole (NEXIUM) 40 MG capsule Take 1 capsule (40 mg total) by mouth daily. 90 capsule 1  . fish oil-omega-3 fatty acids 1000 MG capsule Take 1 g by mouth daily.     . fluocinonide cream (LIDEX) 0.05 % Apply topically 2 (two) times daily. To itchy rashas needed, not on face 30 g 2  . LOTEMAX 0.5 % GEL Place into the left eye 3 (three) times daily.   0  . mirtazapine (REMERON) 15 MG tablet Take 1 tablet (15 mg total) by mouth at bedtime. Can increase to 2 hs for sleep 180 tablet 1  . Multiple Vitamin (MULTIVITAMIN) tablet Take 1 tablet by mouth daily.    Marland Kitchen ofloxacin (OCUFLOX) 0.3 % ophthalmic solution Place 1 drop into the left eye 3 (three) times daily.   0  . Probiotic Product ([employer] PROBIOTIC) CHEW Chew 2 capsules by mouth daily.    . sertraline (ZOLOFT) 100 MG tablet Take 100 mg by mouth daily.     No current facility-administered medications for this visit.    REVIEW OF SYSTEMS:   Constitutional: Denies fevers, chills or abnormal night  sweats Eyes: Denies blurriness of vision, double vision or watery eyes Ears, nose, mouth, throat, and face: Denies mucositis or sore throat Respiratory: Denies cough, dyspnea or wheezes Cardiovascular: Denies palpitation, chest discomfort or lower extremity swelling Gastrointestinal:  Denies nausea, heartburn or change  in bowel habits Skin: Denies abnormal skin rashes Lymphatics: Denies new lymphadenopathy or easy bruising Neurological:Denies numbness, tingling or new weaknesses Behavioral/Psych: Mood is stable, no new changes  All other systems were reviewed with the patient and are negative.  PHYSICAL EXAMINATION: ECOG PERFORMANCE STATUS: 0 - Asymptomatic  Filed Vitals:   02/06/15 1307  BP: 136/83  Pulse: 72  Temp: 98.2 F (36.8 C)  Resp: 19   Filed Weights   02/06/15 1307  Weight: 163 lb 3.2 oz (74.027 kg)    GENERAL:alert, no distress and comfortable SKIN: skin color, texture, turgor are normal, no rashes or significant lesions EYES: normal, conjunctiva are pink and non-injected, sclera clear OROPHARYNX:no exudate, no erythema and lips, buccal mucosa, and tongue normal  NECK: supple, thyroid normal size, non-tender, without nodularity LYMPH:  no palpable lymphadenopathy in the cervical, axillary or inguinal LUNGS: clear to auscultation and percussion with normal breathing effort HEART: regular rate & rhythm and no murmurs and no lower extremity edema ABDOMEN:abdomen soft, non-tender and normal bowel sounds Musculoskeletal:no cyanosis of digits and no clubbing  PSYCH: alert & oriented x 3 with fluent speech NEURO: no focal motor/sensory deficits Breasts: Breast inspection showed a positive surgical scar at the left upper outer quadrant, well healed, no surrounding skin erythema or discharge. No skin change or nipple discharge. (+) fullness and firmness under the surgical scar, no palpable mass. Palpation of the right breasts and axilla revealed no obvious mass that I could  appreciate.  LABORATORY DATA:  I have reviewed the data as listed  CBC Latest Ref Rng 02/06/2015 11/08/2014 08/08/2014  WBC 3.9 - 10.3 10e3/uL 6.5 6.4 12.1(H)  Hemoglobin 11.6 - 15.9 g/dL 13.2 12.5 12.6  Hematocrit 34.8 - 46.6 % 40.1 38.5 38.4  Platelets 145 - 400 10e3/uL 208 190 281    CMP Latest Ref Rng 02/06/2015 11/08/2014 10/18/2014  Glucose 70 - 140 mg/dl 90 100 98  BUN 7.0 - 26.0 mg/dL 17.0 19.7 23  Creatinine 0.6 - 1.1 mg/dL 1.2(H) 1.1 1.16  Sodium 136 - 145 mEq/L 142 143 139  Potassium 3.5 - 5.1 mEq/L 4.2 4.7 4.6  Chloride 96 - 112 mEq/L - - 105  CO2 22 - 29 mEq/L 26 27 29   Calcium 8.4 - 10.4 mg/dL 9.6 9.2 9.6  Total Protein 6.4 - 8.3 g/dL 6.9 6.6 -  Total Bilirubin 0.20 - 1.20 mg/dL 0.30 0.32 -  Alkaline Phos 40 - 150 U/L 182(H) 92 -  AST 5 - 34 U/L 23 23 -  ALT 0 - 55 U/L 15 14 -    Pathology report 07/01/2014 FINAL DIAGNOSIS Diagnosis 1. Breast, lumpectomy, left - DUCTAL CARCINOMA IN SITU WITH CALCIFICATIONS, LOW GRADE, SPANNING 1.8 CM. - THE SURGICAL RESECTION MARGINS ARE NEGATIVE FOR CARCINOMA. - SEE ONCOLOGY TABLE BELOW. 2. Breast, excision, medial margin left - FIBROCYSTIC CHANGES. - THERE IS NO EVIDENCE OF MALIGNANCY. - SEE COMMENT. 3. Breast, excision, superior margin left - FIBROCYSTIC CHANGES. - THERE IS NO EVIDENCE OF MALIGNANCY. - SEE COMMENT. 4. Breast, excision, lateral margin left - FIBROCYSTIC CHANGES. - THERE IS NO EVIDENCE OF MALIGNANCY. - SEE COMMENT. 5. Breast, excision, inferior margin left - FIBROCYSTIC CHANGES. - THERE IS NO EVIDENCE OF MALIGNANCY. - SEE COMMENT. Microscopic Comment 1. BREAST, IN SITU CARCINOMA Specimen, including laterality: Left breast Procedure (include lymph node sampling sentinel-non-sentinel: Lumpectomy and additional margin resection x 4 Grade of carcinoma: Low grade Necrosis: Not identified. Estimated tumor size: (gross measurement): 1.8 cm Treatment effect: N/A Distance to closest  margin: Greater than 0.2  cm to all margins Breast prognostic profile: [phone] 1 of 3 FINAL for ZAKKIYYA, REAVES [id number]) Microscopic Comment(continued) Estrogen receptor: 100%, strong staining intensity Progesterone receptor: 100%, strong staining intensity TNM: pTis, pNX (JK:kh 07-04-14) 2. -5. The surgical resection  RADIOGRAPHIC STUDIES:  Bone density scan 11/15/2014 FINDINGS: AP LUMBAR SPINE L1-L4  Bone Mineral Density (BMD): 0.984 g/cm2  Young Adult T-Score: -0.6  Z-Score: 2.1  Left FEMUR neck  Bone Mineral Density (BMD): 0.700 g/cm2  Young Adult T-Score: -1.3  Z-Score: 0.9  ASSESSMENT: Patient's diagnostic category is LOW BONE MASS by WHO Criteria.  ASSESSMENT:  left breast DCIS s/p lumpectomy, ER/PR positive  PLAN:  #1 left breast DCIS, ER/PR positive The patient had early stage disease. She is considered to be cured by surgery alone.  Any form of adjuvant treatment is for prevention of disease recurrence.  Due to her prior left breast radiation history, no adjuvant radiation at this time I recommended adjuvant antiestrogen therapy to reduce risk of breast cancer in the future -She tolerated tamoxifen quite well, but she's not happy that her Zoloft was switched to Effexor. -So tamoxifen was switched to anastrozole. She is tolerating it well, we will plan for total 5 years. -She will continue annual mammogram, physical exam every 3-4 months for the first 2 years, then every 6-12 months afterwards.  #2 osteopenia  No history of osteoporosis. I recommend her take calcium 1 g a day and vitamin D. -Bone density scan  on 11/15/2014 showed osteopenia. -I discussed the option of adding biphosphonate for her os opinion, she really doesn't want take additional medication. -I discussed anastrozole may potentially make her osteopenia worse. We'll follow her bone density scan in 2 years  Follow-up: 3 months with lab   All questions were answered. The patient knows to  call the clinic with any problems, questions or concerns. I spent 20 minutes counseling the patient face to face. The total time spent in the appointment was 25 minutes and more than 50% was on counseling.     Truitt Merle, MD 02/06/2015 1:41 PM

## 2015-02-06 NOTE — Telephone Encounter (Signed)
Pt confirmed labs/ov per 05/23 POF, gave pt AVS and Calendar.... KJ  °

## 2015-02-22 DIAGNOSIS — H16102 Unspecified superficial keratitis, left eye: Secondary | ICD-10-CM | POA: Diagnosis not present

## 2015-02-22 DIAGNOSIS — H2512 Age-related nuclear cataract, left eye: Secondary | ICD-10-CM | POA: Diagnosis not present

## 2015-02-22 DIAGNOSIS — H10413 Chronic giant papillary conjunctivitis, bilateral: Secondary | ICD-10-CM | POA: Diagnosis not present

## 2015-02-23 DIAGNOSIS — C50412 Malignant neoplasm of upper-outer quadrant of left female breast: Secondary | ICD-10-CM | POA: Diagnosis not present

## 2015-03-15 DIAGNOSIS — H10413 Chronic giant papillary conjunctivitis, bilateral: Secondary | ICD-10-CM | POA: Diagnosis not present

## 2015-03-15 DIAGNOSIS — L719 Rosacea, unspecified: Secondary | ICD-10-CM | POA: Diagnosis not present

## 2015-03-15 DIAGNOSIS — L718 Other rosacea: Secondary | ICD-10-CM | POA: Diagnosis not present

## 2015-03-15 DIAGNOSIS — H16102 Unspecified superficial keratitis, left eye: Secondary | ICD-10-CM | POA: Diagnosis not present

## 2015-03-30 ENCOUNTER — Telehealth: Payer: Self-pay

## 2015-03-30 DIAGNOSIS — F4322 Adjustment disorder with anxiety: Secondary | ICD-10-CM

## 2015-03-30 MED ORDER — SERTRALINE HCL 100 MG PO TABS: 100.0000 mg | ORAL_TABLET | Freq: Every day | ORAL | Status: DC

## 2015-03-30 NOTE — Telephone Encounter (Signed)
Pt called asking if Dr Burr Medico will refill her sertraline. The original prescriber is pt's PCP. "it will certainly make life easier if Dr Burr Medico will refill it". The pt would have to make an appt with her PCP before the sertraline would be refilled.

## 2015-03-30 NOTE — Addendum Note (Signed)
Addended by: Janace Hoard on: 03/30/2015 04:57 PM   Modules accepted: Orders

## 2015-03-30 NOTE — Telephone Encounter (Signed)
Yes, please refill. Thanks.  Jeanne Tran  03/30/2015

## 2015-03-30 NOTE — Telephone Encounter (Signed)
lvm that Dr Burr Medico was willing to refill sertraline and it was e-scribed to [facility].

## 2015-04-05 DIAGNOSIS — M0609 Rheumatoid arthritis without rheumatoid factor, multiple sites: Secondary | ICD-10-CM | POA: Diagnosis not present

## 2015-04-05 DIAGNOSIS — M2550 Pain in unspecified joint: Secondary | ICD-10-CM | POA: Diagnosis not present

## 2015-04-05 DIAGNOSIS — M720 Palmar fascial fibromatosis [Dupuytren]: Secondary | ICD-10-CM | POA: Diagnosis not present

## 2015-04-05 DIAGNOSIS — M545 Low back pain: Secondary | ICD-10-CM | POA: Diagnosis not present

## 2015-04-10 ENCOUNTER — Telehealth: Payer: Self-pay

## 2015-04-10 MED ORDER — DICYCLOMINE HCL 20 MG PO TABS: 20.0000 mg | ORAL_TABLET | Freq: Two times a day (BID) | ORAL | Status: DC

## 2015-04-10 NOTE — Telephone Encounter (Signed)
Medication sent for 90 day supply.  

## 2015-04-11 DIAGNOSIS — M19041 Primary osteoarthritis, right hand: Secondary | ICD-10-CM | POA: Diagnosis not present

## 2015-04-11 DIAGNOSIS — M1811 Unilateral primary osteoarthritis of first carpometacarpal joint, right hand: Secondary | ICD-10-CM | POA: Diagnosis not present

## 2015-04-12 ENCOUNTER — Ambulatory Visit (INDEPENDENT_AMBULATORY_CARE_PROVIDER_SITE_OTHER): Payer: Medicare Other | Admitting: Internal Medicine

## 2015-04-12 ENCOUNTER — Encounter: Payer: Self-pay | Admitting: Internal Medicine

## 2015-04-12 VITALS — BP 120/74 | Temp 98.5°F | Ht 65.0 in | Wt 159.8 lb

## 2015-04-12 DIAGNOSIS — Z8719 Personal history of other diseases of the digestive system: Secondary | ICD-10-CM | POA: Diagnosis not present

## 2015-04-12 DIAGNOSIS — R351 Nocturia: Secondary | ICD-10-CM

## 2015-04-12 DIAGNOSIS — M1990 Unspecified osteoarthritis, unspecified site: Secondary | ICD-10-CM

## 2015-04-12 DIAGNOSIS — R1013 Epigastric pain: Secondary | ICD-10-CM

## 2015-04-12 DIAGNOSIS — M19041 Primary osteoarthritis, right hand: Secondary | ICD-10-CM

## 2015-04-12 MED ORDER — RANITIDINE HCL 150 MG PO TABS: 150.0000 mg | ORAL_TABLET | Freq: Every day | ORAL | Status: DC

## 2015-04-12 NOTE — Progress Notes (Signed)
Pre visit review using our clinic review tool, if applicable. No additional management support is needed unless otherwise documented below in the visit note.   Chief Complaint  Patient presents with  . Abdominal Pain    x1WK    HPI: Patient Jeanne Tran   81 y.o.  comes in today for SDA for  new problem evaluation. Complain of high localized epigastritic pain . aboaut a week .   The same. It wakes her up from sleep without associated symptom Can remember when sleep wake up. No nausea  .   Goes away if she gets up moves around. Does not radiate to her back no cough no heartburn in her chest. ? Urinary  Frequency dull pain.  Goes away over  When gets u no  No wegiht loss vomiting .  Doesn't take aspirin Advil Aleve products. Has been trying to take Nexium every other night to avoid the metabolic consequences of long-term use.  Taking calming med and came up and slept .  Has had her gallbladder out his had no GI problems since that time. No and unintentional weight loss. New line is seeing hand surgeon for contractures in her right hand after seeing the rheumatologist. Considering a topical compounded diclofenac baclofen lidocaine to the hand.  ROS: See pertinent positives and negatives per HPI. Is more drowsy now because she's been awakened from sleep takes Remeron at night and a quarter of Ambien at times.  Past Medical History  Diagnosis Date  . Hyperlipidemia   . RLS (restless legs syndrome)   . Breast cancer     hx of left with radiation and lumpectomy  . History of colonic polyps   . GERD (gastroesophageal reflux disease)   . History of ERCP     and sphincterotomy for abnormal lfts and dilated biliary ducts 5/05  . Pulmonary nodule   . Migraine headache   . Wears contact lenses     No family history on file.  History   Social History  . Marital Status: Widowed    Spouse Name: N/A  . Number of Children: N/A  . Years of Education: N/A   Social History Main Topics   . Smoking status: Former Smoker -- 3.00 packs/day for 25 years    Types: Cigarettes    Quit date: 02/26/1994  . Smokeless tobacco: Never Used  . Alcohol Use: No  . Drug Use: No  . Sexual Activity: Not on file   Other Topics Concern  . 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                    Outpatient Prescriptions Prior to Visit  Medication Sig Dispense Refill  . anastrozole (ARIMIDEX) 1 MG tablet Take 1 tablet (1 mg total) by mouth daily. 90 tablet 1  . BIOTIN PO Take 2,000 mcg by mouth daily.    . cetirizine (ZYRTEC) 10 MG tablet Take 10 mg by mouth daily.    . Cholecalciferol (VITAMIN D3) 2000 UNITS TABS Take 1 tablet by mouth daily.    . CRESTOR 5 MG tablet take 1 tablet by mouth once daily 90 tablet 2  . dicyclomine (BENTYL) 20 MG tablet Take 1 tablet (20 mg total) by mouth 2 (two) times daily. 180 tablet 2  . esomeprazole (NEXIUM) 40 MG capsule Take 1 capsule (  40 mg total) by mouth daily. 90 capsule 1  . fish oil-omega-3 fatty acids 1000 MG capsule Take 1 g by mouth daily.     . mirtazapine (REMERON) 15 MG tablet Take 1 tablet (15 mg total) by mouth at bedtime. Can increase to 2 hs for sleep 180 tablet 1  . Multiple Vitamin (MULTIVITAMIN) tablet Take 1 tablet by mouth daily.    Marland Kitchen ofloxacin (OCUFLOX) 0.3 % ophthalmic solution Place 1 drop into the left eye 3 (three) times daily.   0  . Probiotic Product ([employer]S PROBIOTIC) CHEW Chew 2 capsules by mouth daily.    . sertraline (ZOLOFT) 100 MG tablet Take 1 tablet (100 mg total) by mouth daily. 90 tablet 0  . diazepam (VALIUM) 5 MG tablet Take 1-2 tablets before procedure (Patient not taking: Reported on 04/12/2015) 6 tablet 0  . fluocinonide cream (LIDEX) 0.05 % Apply topically 2 (two) times daily. To itchy rashas needed, not on face 30 g 2  . LOTEMAX 0.5 % GEL Place into the left eye 3 (three) times daily.   0   No facility-administered medications prior  to visit.     EXAM:  BP 120/74 mmHg  Temp(Src) 98.5 F (36.9 C) (Oral)  Ht 5\' 5"  (1.651 m)  Wt 159 lb 12.8 oz (72.485 kg)  BMI 26.59 kg/m2  Body mass index is 26.59 kg/(m^2).  GENERAL: vitals reviewed and listed above, alert, oriented, appears well hydrated and in no acute distress HEENT: atraumatic, conjunctiva  clear, no obvious abnormalities on inspection of external nose and ears NECK: no obvious masses on inspection palpation  LUNGS: clear to auscultation bilaterally, no wheezes, rales or rhonchi, CV: HRRR, no clubbing cyanosis or  peripheral edema nl cap refill  Abdomen soft without hepatomegaly guarding or rebound no bruits noted and no mass. No focal tenderness but points to the hypogastric area MS: moves all extremities right hand with significant arthritis with contractures flexion and some swelling. PSYCH: pleasant and cooperative, no obvious depression or anxiety  ASSESSMENT AND PLAN:  Discussed the following assessment and plan:  Abdominal pain, epigastric - Plan: POCT urinalysis dipstick  Arthritis of right hand  Nocturia Curious epigastric pain fairly localized without associated symptoms except perhaps nocturia that wakes her up at night over the last week. She does eat late at night and has been trying to wean down on her Nexium but has no classic GERD symptoms otherwise. No chest pain respiratory problem and no new medication. Will optimize nocturnal acid reflux precautions as she does have a hiatal hernia and then follow-up if not improved. Add ranitidine 150-300 at night.. Take her snack earlier in the evening. Follow-up in 3-4 weeks. Regard to her arthritis with contractures in her right hand topical compounded medicine I have no medical objection I don't think at the wrist to her health. We'll get a urinalysis today to rule out infection. -Patient advised to return or notify health care team  if symptoms worsen ,persist or new concerns arise.  Patient  Instructions  It is possible that your hiatal hernia and gastric acid is causing the pain in the area you discussed scribed. Let's add an acid blocker that isn't quite as powerful as the Nexium but take it every night ranitidine 150 mg. Avoid having  snack late at night move it up 3 or 4 hours.  We'll check a urinalysis today to make sure you don't have a urine infection today. If your stomach symptoms are not improving over the next  2-3 weeks on one sheet to get back with [place] and will reevaluate. Consider ultrasound other tests. Contact us if fever blood in stool vomiting.  You can tell Dr. Fredna Dow  that the compounded topical he is thinking of to help your hand is okay with [place] and appears to be safe in your condition.  ROV in 1 month or prn unless all better.     Standley Brooking. Latronda Spink M.D.

## 2015-04-12 NOTE — Patient Instructions (Signed)
It is possible that your hiatal hernia and gastric acid is causing the pain in the area you discussed scribed. Let's add an acid blocker that isn't quite as powerful as the Nexium but take it every night ranitidine 150 mg. Avoid having  snack late at night move it up 3 or 4 hours.  We'll check a urinalysis today to make sure you don't have a urine infection today. If your stomach symptoms are not improving over the next 2-3 weeks on one sheet to get back with [place] and will reevaluate. Consider ultrasound other tests. Contact us if fever blood in stool vomiting.  You can tell Dr. Fredna Dow  that the compounded topical he is thinking of to help your hand is okay with [place] and appears to be safe in your condition.  ROV in 1 month or prn unless all better.

## 2015-04-13 LAB — POCT URINALYSIS DIP (MANUAL ENTRY)
BILIRUBIN UA: NEGATIVE
BILIRUBIN UA: NEGATIVE
Blood, UA: NEGATIVE
GLUCOSE UA: NEGATIVE
Leukocytes, UA: NEGATIVE
NITRITE UA: NEGATIVE
Protein Ur, POC: NEGATIVE
SPEC GRAV UA: 1.015
UROBILINOGEN UA: 0.2
pH, UA: 6

## 2015-04-17 DIAGNOSIS — L718 Other rosacea: Secondary | ICD-10-CM | POA: Diagnosis not present

## 2015-04-17 DIAGNOSIS — L719 Rosacea, unspecified: Secondary | ICD-10-CM | POA: Diagnosis not present

## 2015-04-17 DIAGNOSIS — H2512 Age-related nuclear cataract, left eye: Secondary | ICD-10-CM | POA: Diagnosis not present

## 2015-04-17 DIAGNOSIS — Z961 Presence of intraocular lens: Secondary | ICD-10-CM | POA: Diagnosis not present

## 2015-04-17 DIAGNOSIS — H01022 Squamous blepharitis right lower eyelid: Secondary | ICD-10-CM | POA: Diagnosis not present

## 2015-04-17 DIAGNOSIS — H01024 Squamous blepharitis left upper eyelid: Secondary | ICD-10-CM | POA: Diagnosis not present

## 2015-04-17 DIAGNOSIS — H01021 Squamous blepharitis right upper eyelid: Secondary | ICD-10-CM | POA: Diagnosis not present

## 2015-04-17 DIAGNOSIS — H01025 Squamous blepharitis left lower eyelid: Secondary | ICD-10-CM | POA: Diagnosis not present

## 2015-04-18 DIAGNOSIS — M24541 Contracture, right hand: Secondary | ICD-10-CM | POA: Diagnosis not present

## 2015-04-18 DIAGNOSIS — M25641 Stiffness of right hand, not elsewhere classified: Secondary | ICD-10-CM | POA: Diagnosis not present

## 2015-04-25 DIAGNOSIS — M25641 Stiffness of right hand, not elsewhere classified: Secondary | ICD-10-CM | POA: Diagnosis not present

## 2015-04-25 DIAGNOSIS — M24541 Contracture, right hand: Secondary | ICD-10-CM | POA: Diagnosis not present

## 2015-05-02 DIAGNOSIS — M24541 Contracture, right hand: Secondary | ICD-10-CM | POA: Diagnosis not present

## 2015-05-02 DIAGNOSIS — M25641 Stiffness of right hand, not elsewhere classified: Secondary | ICD-10-CM | POA: Diagnosis not present

## 2015-05-06 ENCOUNTER — Other Ambulatory Visit: Payer: Self-pay | Admitting: Internal Medicine

## 2015-05-09 ENCOUNTER — Ambulatory Visit (HOSPITAL_BASED_OUTPATIENT_CLINIC_OR_DEPARTMENT_OTHER): Payer: Medicare Other | Admitting: Hematology

## 2015-05-09 ENCOUNTER — Encounter: Payer: Self-pay | Admitting: Hematology

## 2015-05-09 ENCOUNTER — Other Ambulatory Visit (HOSPITAL_BASED_OUTPATIENT_CLINIC_OR_DEPARTMENT_OTHER): Payer: Medicare Other

## 2015-05-09 ENCOUNTER — Telehealth: Payer: Self-pay | Admitting: Hematology

## 2015-05-09 VITALS — BP 139/60 | HR 68 | Temp 98.2°F | Resp 18 | Ht 65.0 in | Wt 160.6 lb

## 2015-05-09 DIAGNOSIS — Z170 Estrogen receptor positive status [ER+]: Secondary | ICD-10-CM | POA: Diagnosis not present

## 2015-05-09 DIAGNOSIS — C50912 Malignant neoplasm of unspecified site of left female breast: Secondary | ICD-10-CM

## 2015-05-09 DIAGNOSIS — M8580 Other specified disorders of bone density and structure, unspecified site: Secondary | ICD-10-CM | POA: Diagnosis not present

## 2015-05-09 DIAGNOSIS — D0512 Intraductal carcinoma in situ of left breast: Secondary | ICD-10-CM | POA: Diagnosis not present

## 2015-05-09 DIAGNOSIS — Z853 Personal history of malignant neoplasm of breast: Secondary | ICD-10-CM

## 2015-05-09 DIAGNOSIS — M24541 Contracture, right hand: Secondary | ICD-10-CM | POA: Diagnosis not present

## 2015-05-09 DIAGNOSIS — M25641 Stiffness of right hand, not elsewhere classified: Secondary | ICD-10-CM | POA: Diagnosis not present

## 2015-05-09 LAB — COMPREHENSIVE METABOLIC PANEL (CC13)
ALT: 18 U/L (ref 0–55)
AST: 22 U/L (ref 5–34)
Albumin: 3.9 g/dL (ref 3.5–5.0)
Alkaline Phosphatase: 114 U/L (ref 40–150)
Anion Gap: 10 mEq/L (ref 3–11)
BUN: 26.5 mg/dL — ABNORMAL HIGH (ref 7.0–26.0)
CALCIUM: 10 mg/dL (ref 8.4–10.4)
CO2: 26 meq/L (ref 22–29)
CREATININE: 1.4 mg/dL — AB (ref 0.6–1.1)
Chloride: 108 mEq/L (ref 98–109)
EGFR: 37 mL/min/{1.73_m2} — ABNORMAL LOW (ref >90)
GLUCOSE: 96 mg/dL (ref 70–140)
Potassium: 4.5 mEq/L (ref 3.5–5.1)
Sodium: 143 mEq/L (ref 136–145)
Total Bilirubin: 0.3 mg/dL (ref 0.20–1.20)
Total Protein: 6.6 g/dL (ref 6.4–8.3)

## 2015-05-09 LAB — CBC WITH DIFFERENTIAL/PLATELET
BASO%: 0.5 % (ref 0.0–2.0)
Basophils Absolute: 0 10*3/uL (ref 0.0–0.1)
EOS%: 3.8 % (ref 0.0–7.0)
Eosinophils Absolute: 0.2 10*3/uL (ref 0.0–0.5)
HEMATOCRIT: 40.5 % (ref 34.8–46.6)
HGB: 13.5 g/dL (ref 11.6–15.9)
LYMPH#: 1.6 10*3/uL (ref 0.9–3.3)
LYMPH%: 25 % (ref 14.0–49.7)
MCH: 31.1 pg (ref 25.1–34.0)
MCHC: 33.3 g/dL (ref 31.5–36.0)
MCV: 93.3 fL (ref 79.5–101.0)
MONO#: 0.7 10*3/uL (ref 0.1–0.9)
MONO%: 11.5 % (ref 0.0–14.0)
NEUT#: 3.7 10*3/uL (ref 1.5–6.5)
NEUT%: 59.2 % (ref 38.4–76.8)
Platelets: 163 10*3/uL (ref 145–400)
RBC: 4.34 10*6/uL (ref 3.70–5.45)
RDW: 13.7 % (ref 11.2–14.5)
WBC: 6.3 10*3/uL (ref 3.9–10.3)

## 2015-05-09 MED ORDER — ANASTROZOLE 1 MG PO TABS: 1.0000 mg | ORAL_TABLET | Freq: Every day | ORAL | Status: DC

## 2015-05-09 NOTE — Telephone Encounter (Signed)
per pof to sch pt appt-gave pt copy of sch-sch mamma-gave pt time/date/location

## 2015-05-09 NOTE — Telephone Encounter (Signed)
Sent to the pharmacy by e-scribe. 

## 2015-05-09 NOTE — Progress Notes (Signed)
[facility] NOTE  Patient Care Team: Burnis Medin, MD as PCP - General [place] Dragon, MD (Gastroenterology) Dyke Maes, OD (Optometry) Kary Kos, MD (Neurosurgery) [name], MD as Consulting Physician (General Surgery) Truitt Merle, MD as Consulting Physician (Hematology) Daryll Brod, MD as Consulting Physician (Orthopedic Surgery)  Oncology History   Diagnosed in 1995, s/p lumpectomy and 5 years Tamoxifen. Records not available      Cancer of left breast   1995 Cancer Diagnosis History of left breast cancer, status post lumpectomy and 5 years tamoxifen.   06/08/2014 Initial Biopsy Left breast biopsy showed DCIS.   07/01/2014 Surgery Left breast lumpectomy, surgical margins were negative.   08/10/2014 -  Anti-estrogen oral therapy She started adjuvant tamoxifen, and was subsequently switched to anastrozole a few months later due to her anti-depression medications Zoloft.   05/09/2015 Initial Diagnosis Cancer of left breast    CHIEF COMPLAINTS:  Follow up left breast DCIS   HISTORY OF PRESENTING ILLNESS:  Jeanne Tran 81 y.o. female is here because of recent diagnosis of left breast DCIS.   She is referred by Dr. Zella Richer because of a new diagnosis of left breast DCIS. She has a previous history of left breast cancer in 1995 and was treated with lumpectomy, axillary lymph node dissection, radiation, and 7 years tamoxifen. She has been followed by yearly mammogram. She was noted to have a new mass in the left breast on recent mammography at the 12 to 1:00 position, middle depth. Image guided biopsy was performed with the above pathology. The tumor is ER and PR positive.   She was seen by Dr. Zella Richer. She declined mastectomy, underwent lumpectomy on 07/01/2014. The surgery went well without any complications. She has recovered well from the surgery, except mild fatigue after the surgery. She is able to function well at home. She denies any pain,  dyspnea, abdominal discomfort, or any other symptoms.  In terms of breast cancer risk profile:  She menarched at early age of 81 and went to menopause at age mind 44's.   She had 3 pregnancy, her first child was born at age 81.   She did not breast-fed her child.  She did received birth control pills for several years.  She was on hormone replacement therapy for 12-13 years.  She has no family history of Breast/GYN/GI cancer  CURRENT THERAPY: Anastrozole 1 mg once daily  INTERIM HISTORY: Deajah returns for follow-up. She has been compliant with anastrozole, and tolerated very well. She has occasional mild joint discomfort, tolerable. No other noticeable side effects. She is back to Zoloft since she switched from tamoxifen to anastrozole, and she is much happier with Zoloft. No other new complaints.  MEDICAL HISTORY:  Past Medical History  Diagnosis Date  . Hyperlipidemia   . RLS (restless legs syndrome)   . Breast cancer     hx of left with radiation and lumpectomy  . History of colonic polyps   . GERD (gastroesophageal reflux disease)   . History of ERCP     and sphincterotomy for abnormal lfts and dilated biliary ducts 5/05  . Pulmonary nodule   . Migraine headache   . Wears contact lenses     SURGICAL HISTORY: Past Surgical History  Procedure Laterality Date  . Ercp      for abnormal lfts and dilated biliary ducts 5/05  . Sphincterotomy      for abnormal lfts and dilated biliary ducts 5/05  . Lymph node  biopsy    . Cystocele repair    . Appendectomy    . Hemorrhoid surgery    . Tonsillectomy    . Cervical spine surgery  3/03  . Gallbladder duct open  2006  . Cholecystectomy  2012  . Orthoscopic rt knee    . Hysteroscopy    . Complete hysterectomy    . Breast lumpectomy  1995    lt lumpsnbx  . Abdominal hysterectomy      bso  . Breast lumpectomy with radioactive seed localization Left 07/01/2014    Procedure: LEFT BREAST LUMPECTOMY WITH RADIOACTIVE SEED  LOCALIZATION;  Surgeon: [name], MD;  Location: [facility];  Service: General;  Laterality: Left;    SOCIAL HISTORY: History   Social History  . Marital Status: Widowed    Spouse Name: N/A    Number of Children: N/A  . Years of Education: N/A  She lives alone at home.   Occupational History  . Not on file.   Social History Main Topics  . Smoking status: Former Smoker -- 3.00 packs/day for 25 years    Types: Cigarettes    Quit date: 02/26/1994  . Smokeless tobacco: Never Used  . Alcohol Use: No  . Drug Use: No  . 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                    FAMILY HISTORY: No family history of malignancy.  ALLERGIES:  is allergic to codeine.  MEDICATIONS:  Current Outpatient Prescriptions  Medication Sig Dispense Refill  . anastrozole (ARIMIDEX) 1 MG tablet Take 1 tablet (1 mg total) by mouth daily. 90 tablet 2  . BIOTIN PO Take 2,000 mcg by mouth daily.    Marland Kitchen BLEPHAMIDE S.O.P. ophthalmic ointment     . cetirizine (ZYRTEC) 10 MG tablet Take 10 mg by mouth daily.    . Cholecalciferol (VITAMIN D3) 2000 UNITS TABS Take 1 tablet by mouth daily.    . CRESTOR 5 MG tablet take 1 tablet by mouth once daily 90 tablet 2  . diazepam (VALIUM) 5 MG tablet Take 1-2 tablets before procedure 6 tablet 0  . dicyclomine (BENTYL) 20 MG tablet Take 1 tablet (20 mg total) by mouth 2 (two) times daily. 180 tablet 2  . DUREZOL 0.05 % EMUL     . esomeprazole (NEXIUM) 40 MG capsule take 1 capsule by mouth once daily 90 capsule 1  . fish oil-omega-3 fatty acids 1000 MG capsule Take 1 g by mouth daily.     Marland Kitchen LOTEMAX 0.5 % GEL instill 1 drop into left eye three times a day  0  . mirtazapine (REMERON) 15 MG tablet Take 1 tablet (15 mg total) by mouth at bedtime. Can increase to 2 hs for sleep 180 tablet 1  . Multiple Vitamin (MULTIVITAMIN) tablet Take 1  tablet by mouth daily.    Marland Kitchen ofloxacin (OCUFLOX) 0.3 % ophthalmic solution Place 1 drop into the left eye 3 (three) times daily.   0  . Probiotic Product ([employer] PROBIOTIC) CHEW Chew 2 capsules by mouth daily.    . ranitidine (ZANTAC) 150 MG tablet Take 1 tablet (150 mg total) by mouth at bedtime. May increase to 2 at bedtime 60 tablet 1  . sertraline (ZOLOFT)  100 MG tablet Take 1 tablet (100 mg total) by mouth daily. 90 tablet 0  . zolpidem (AMBIEN) 10 MG tablet Take 5 mg by mouth at bedtime as needed for sleep.     No current facility-administered medications for this visit.    REVIEW OF SYSTEMS:   Constitutional: Denies fevers, chills or abnormal night sweats Eyes: Denies blurriness of vision, double vision or watery eyes Ears, nose, mouth, throat, and face: Denies mucositis or sore throat Respiratory: Denies cough, dyspnea or wheezes Cardiovascular: Denies palpitation, chest discomfort or lower extremity swelling Gastrointestinal:  Denies nausea, heartburn or change in bowel habits Skin: Denies abnormal skin rashes Lymphatics: Denies new lymphadenopathy or easy bruising Neurological:Denies numbness, tingling or new weaknesses Behavioral/Psych: Mood is stable, no new changes  All other systems were reviewed with the patient and are negative.  PHYSICAL EXAMINATION: ECOG PERFORMANCE STATUS: 0 - Asymptomatic  Filed Vitals:   05/09/15 1253  BP: 139/60  Pulse: 68  Temp: 98.2 F (36.8 C)  Resp: 18   Filed Weights   05/09/15 1253  Weight: 160 lb 9.6 oz (72.848 kg)    GENERAL:alert, no distress and comfortable SKIN: skin color, texture, turgor are normal, no rashes or significant lesions EYES: normal, conjunctiva are pink and non-injected, sclera clear OROPHARYNX:no exudate, no erythema and lips, buccal mucosa, and tongue normal  NECK: supple, thyroid normal size, non-tender, without nodularity LYMPH:  no palpable lymphadenopathy in the cervical, axillary or inguinal LUNGS: clear  to auscultation and percussion with normal breathing effort HEART: regular rate & rhythm and no murmurs and no lower extremity edema ABDOMEN:abdomen soft, non-tender and normal bowel sounds Musculoskeletal:no cyanosis of digits and no clubbing  PSYCH: alert & oriented x 3 with fluent speech NEURO: no focal motor/sensory deficits Breasts: Breast inspection showed a positive surgical scar at the left upper outer quadrant, well healed, no surrounding skin erythema or discharge. No skin change or nipple discharge. (+) fullness and firmness under the surgical scar, no palpable mass. Palpation of the right breasts and axilla revealed no obvious mass that I could appreciate.  LABORATORY DATA:  I have reviewed the data as listed  CBC Latest Ref Rng 05/09/2015 02/06/2015 11/08/2014  WBC 3.9 - 10.3 10e3/uL 6.3 6.5 6.4  Hemoglobin 11.6 - 15.9 g/dL 13.5 13.2 12.5  Hematocrit 34.8 - 46.6 % 40.5 40.1 38.5  Platelets 145 - 400 10e3/uL 163 208 190    CMP Latest Ref Rng 05/09/2015 02/06/2015 11/08/2014  Glucose 70 - 140 mg/dl 96 90 100  BUN 7.0 - 26.0 mg/dL 26.5(H) 17.0 19.7  Creatinine 0.6 - 1.1 mg/dL 1.4(H) 1.2(H) 1.1  Sodium 136 - 145 mEq/L 143 142 143  Potassium 3.5 - 5.1 mEq/L 4.5 4.2 4.7  Chloride 96 - 112 mEq/L - - -  CO2 22 - 29 mEq/L 26 26 27   Calcium 8.4 - 10.4 mg/dL 10.0 9.6 9.2  Total Protein 6.4 - 8.3 g/dL 6.6 6.9 6.6  Total Bilirubin 0.20 - 1.20 mg/dL 0.30 0.30 0.32  Alkaline Phos 40 - 150 U/L 114 182(H) 92  AST 5 - 34 U/L 22 23 23   ALT 0 - 55 U/L 18 15 14     Pathology report 07/01/2014 FINAL DIAGNOSIS Diagnosis 1. Breast, lumpectomy, left - DUCTAL CARCINOMA IN SITU WITH CALCIFICATIONS, LOW GRADE, SPANNING 1.8 CM. - THE SURGICAL RESECTION MARGINS ARE NEGATIVE FOR CARCINOMA. - SEE ONCOLOGY TABLE BELOW. 2. Breast, excision, medial margin left - FIBROCYSTIC CHANGES. - THERE IS NO EVIDENCE OF MALIGNANCY. - SEE COMMENT.  3. Breast, excision, superior margin left - FIBROCYSTIC  CHANGES. - THERE IS NO EVIDENCE OF MALIGNANCY. - SEE COMMENT. 4. Breast, excision, lateral margin left - FIBROCYSTIC CHANGES. - THERE IS NO EVIDENCE OF MALIGNANCY. - SEE COMMENT. 5. Breast, excision, inferior margin left - FIBROCYSTIC CHANGES. - THERE IS NO EVIDENCE OF MALIGNANCY. - SEE COMMENT. Microscopic Comment 1. BREAST, IN SITU CARCINOMA Specimen, including laterality: Left breast Procedure (include lymph node sampling sentinel-non-sentinel: Lumpectomy and additional margin resection x 4 Grade of carcinoma: Low grade Necrosis: Not identified. Estimated tumor size: (gross measurement): 1.8 cm Treatment effect: N/A Distance to closest margin: Greater than 0.2 cm to all margins Breast prognostic profile: [phone] 1 of 3 FINAL for SENECA, MOBBS [id number]) Microscopic Comment(continued) Estrogen receptor: 100%, strong staining intensity Progesterone receptor: 100%, strong staining intensity TNM: pTis, pNX (JK:kh 07-04-14) 2. -5. The surgical resection  RADIOGRAPHIC STUDIES:  Bone density scan 11/15/2014 FINDINGS: AP LUMBAR SPINE L1-L4  Bone Mineral Density (BMD): 0.984 g/cm2  Young Adult T-Score: -0.6  Z-Score: 2.1  Left FEMUR neck  Bone Mineral Density (BMD): 0.700 g/cm2  Young Adult T-Score: -1.3  Z-Score: 0.9  ASSESSMENT: Patient's diagnostic category is LOW BONE MASS by WHO Criteria.  ASSESSMENT:  left breast DCIS s/p lumpectomy, ER/PR positive  PLAN:  #1 left breast DCIS, ER/PR positive The patient had early stage disease. She is considered to be cured by surgery alone.  Any form of adjuvant treatment is for prevention of disease recurrence.  Due to her prior left breast radiation history, no adjuvant radiation at this time I recommended adjuvant antiestrogen therapy to reduce risk of breast cancer in the future -She tolerated tamoxifen quite well, but she's not happy that her Zoloft was switched to Effexor. -So tamoxifen  was switched to anastrozole. She is tolerating it well, we will plan for total 5 years. -She will continue annual mammogram, and physical exam for cancer surveillance -She is due for mammogram, I'll order one to be done within the next months.  #2 osteopenia  No history of osteoporosis. I recommend her take calcium 1 g a day and vitamin D. -Bone density scan  on 11/15/2014 showed osteopenia. -I discussed the option of adding biphosphonate for her os opinion, she really doesn't want take additional medication. -I discussed anastrozole may potentially make her osteopenia worse. We'll follow her bone density scan in 2 years  Follow-up: 4 months with lab, mammogram in the next months. Continue anastrozole, I refilled for her today   All questions were answered. The patient knows to call the clinic with any problems, questions or concerns. I spent 20 minutes counseling the patient face to face. The total time spent in the appointment was 25 minutes and more than 50% was on counseling.     Truitt Merle, MD 05/09/2015 6:31 PM

## 2015-05-15 ENCOUNTER — Encounter: Payer: Self-pay | Admitting: Internal Medicine

## 2015-05-15 ENCOUNTER — Ambulatory Visit (INDEPENDENT_AMBULATORY_CARE_PROVIDER_SITE_OTHER): Payer: Medicare Other | Admitting: Internal Medicine

## 2015-05-15 VITALS — BP 132/72 | Temp 98.0°F | Ht 65.0 in | Wt 159.0 lb

## 2015-05-15 DIAGNOSIS — Z23 Encounter for immunization: Secondary | ICD-10-CM

## 2015-05-15 DIAGNOSIS — R7989 Other specified abnormal findings of blood chemistry: Secondary | ICD-10-CM

## 2015-05-15 DIAGNOSIS — N289 Disorder of kidney and ureter, unspecified: Secondary | ICD-10-CM | POA: Diagnosis not present

## 2015-05-15 DIAGNOSIS — G4700 Insomnia, unspecified: Secondary | ICD-10-CM

## 2015-05-15 DIAGNOSIS — R1013 Epigastric pain: Secondary | ICD-10-CM | POA: Diagnosis not present

## 2015-05-15 DIAGNOSIS — R748 Abnormal levels of other serum enzymes: Secondary | ICD-10-CM

## 2015-05-15 DIAGNOSIS — Z79899 Other long term (current) drug therapy: Secondary | ICD-10-CM

## 2015-05-15 LAB — BASIC METABOLIC PANEL
BUN: 26 mg/dL — AB (ref 6–23)
CHLORIDE: 101 meq/L (ref 96–112)
CO2: 30 meq/L (ref 19–32)
CREATININE: 1.31 mg/dL — AB (ref 0.40–1.20)
Calcium: 9.6 mg/dL (ref 8.4–10.5)
GFR: 41.56 mL/min — ABNORMAL LOW (ref >60.00)
GLUCOSE: 89 mg/dL (ref 70–99)
Potassium: 4.1 mEq/L (ref 3.5–5.1)
Sodium: 137 mEq/L (ref 135–145)

## 2015-05-15 NOTE — Patient Instructions (Signed)
Stay off of the celebrex  .and any  Medications like advil Jeanne Tran which  Can be toxic to the kidneys   Your kidney function continues to be decreased  .   Plan to get ultrasound of the kidney   You will be notified about appt for this.   If ongoing problem we may get renal doctors involved .   i think ok to  Use the remeron if helps but ask dr Burr Medico  About this also  Gals you stomach is doing ok  Wean nexium as tolerated .

## 2015-05-15 NOTE — Progress Notes (Signed)
Pre visit review using our clinic review tool, if applicable. No additional management support is needed unless otherwise documented below in the visit note.  Chief Complaint  Patient presents with  . Follow-up    HPI: Comes in for follow up of  multiple medical issues  abd sx  Better adn trying to wean the ppi Having ms pain more off the celebrex and to have elevated creatinine     Told to stop the remeron by oncology cause  On zoloft but noted less sleep since off .  Hand arthiris   More functional   Last appt"Curious epigastric pain fairly localized without associated symptoms except perhaps nocturia that wakes her up at night over the last week. She does eat late at night and has been trying to wean down on her Nexium but has no classic GERD symptoms otherwise. No chest pain respiratory problem and no new medication. Will optimize nocturnal acid reflux precautions as she does have a hiatal hernia and then follow-up if not improved. Add ranitidine 150-300 at night.. Take her snack earlier in the evening. Follow-up in 3-4 weeks. Regard to her arthritis with contractures in her right hand topical compounded medicine I have no medical objection I don't think at the wrist to her health. We'll get a urinalysis today to rule out infection."  ROS: See pertinent positives and negatives per HPI.  Past Medical History  Diagnosis Date  . Hyperlipidemia   . RLS (restless legs syndrome)   . Breast cancer     hx of left with radiation and lumpectomy  . History of colonic polyps   . GERD (gastroesophageal reflux disease)   . History of ERCP     and sphincterotomy for abnormal lfts and dilated biliary ducts 5/05  . Pulmonary nodule   . Migraine headache   . Wears contact lenses     No family history on file.  Social History   Social History  . Marital Status: Widowed    Spouse Name: N/A  . Number of Children: N/A  . Years of Education: N/A   Social History Main Topics  . Smoking  status: Former Smoker -- 3.00 packs/day for 25 years    Types: Cigarettes    Quit date: 02/26/1994  . Smokeless tobacco: Never Used  . Alcohol Use: No  . Drug Use: No  . Sexual Activity: Not Asked   Other Topics Concern  . 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                    Outpatient Prescriptions Prior to Visit  Medication Sig Dispense Refill  . anastrozole (ARIMIDEX) 1 MG tablet Take 1 tablet (1 mg total) by mouth daily. 90 tablet 2  . BIOTIN PO Take 2,000 mcg by mouth daily.    Marland Kitchen BLEPHAMIDE S.O.P. ophthalmic ointment     . cetirizine (ZYRTEC) 10 MG tablet Take 10 mg by mouth daily.    . Cholecalciferol (VITAMIN D3) 2000 UNITS TABS Take 1 tablet by mouth daily.    . CRESTOR 5 MG tablet take 1 tablet by mouth once daily 90 tablet 2  . diazepam (VALIUM) 5 MG tablet Take 1-2 tablets before procedure 6 tablet 0  . dicyclomine (BENTYL) 20 MG tablet Take 1 tablet (20 mg total) by mouth 2 (two) times daily. 180 tablet 2  .  DUREZOL 0.05 % EMUL     . esomeprazole (NEXIUM) 40 MG capsule take 1 capsule by mouth once daily 90 capsule 1  . fish oil-omega-3 fatty acids 1000 MG capsule Take 1 g by mouth daily.     Marland Kitchen LOTEMAX 0.5 % GEL instill 1 drop into left eye three times a day  0  . Multiple Vitamin (MULTIVITAMIN) tablet Take 1 tablet by mouth daily.    Marland Kitchen ofloxacin (OCUFLOX) 0.3 % ophthalmic solution Place 1 drop into the left eye 3 (three) times daily.   0  . Probiotic Product ([employer]S PROBIOTIC) CHEW Chew 2 capsules by mouth daily.    . ranitidine (ZANTAC) 150 MG tablet Take 1 tablet (150 mg total) by mouth at bedtime. May increase to 2 at bedtime 60 tablet 1  . sertraline (ZOLOFT) 100 MG tablet Take 1 tablet (100 mg total) by mouth daily. 90 tablet 0  . zolpidem (AMBIEN) 10 MG tablet Take 5 mg by mouth at bedtime as needed for sleep.    . mirtazapine (REMERON) 15 MG tablet Take 1 tablet (15 mg total) by mouth  at bedtime. Can increase to 2 hs for sleep (Patient not taking: Reported on 05/15/2015) 180 tablet 1   No facility-administered medications prior to visit.     EXAM:  BP 132/72 mmHg  Temp(Src) 98 F (36.7 C) (Oral)  Ht 5\' 5"  (1.651 m)  Wt 159 lb (72.122 kg)  BMI 26.46 kg/m2  Body mass index is 26.46 kg/(m^2).  GENERAL: vitals reviewed and listed above, alert, oriented, appears well hydrated and in no acute distress HEENT: atraumatic, conjunctiva  clear, no obvious abnormalities on inspection of external nose and ears NECK: no obvious masses on inspection palpation  MS: moves all extremities   Right hand more rom ulnar deviation and deformity noted PSYCH: pleasant and cooperative, no obvious depression or anxiety Lab Results  Component Value Date   WBC 6.3 05/09/2015   HGB 13.5 05/09/2015   HCT 40.5 05/09/2015   PLT 163 05/09/2015   GLUCOSE 89 05/15/2015   CHOL 176 10/18/2014   TRIG 164.0* 10/18/2014   HDL 73.40 10/18/2014   LDLCALC 70 10/18/2014   ALT 18 05/09/2015   AST 22 05/09/2015   NA 137 05/15/2015   K 4.1 05/15/2015   CL 101 05/15/2015   CREATININE 1.31* 05/15/2015   BUN 26* 05/15/2015   CO2 30 05/15/2015   TSH 3.37 10/18/2014   INR 1.00 06/29/2014   HGBA1C 6.3 12/14/2013    ASSESSMENT AND PLAN:  Discussed the following assessment and plan:  Renal insufficiency - Plan: Basic metabolic panel, Protein / creatinine ratio, urine, US Renal  new onset  renal insufficiency - uncertain cause   has stopped celebrex xheck urine protein cr ratio when can give specimen - Plan: US Renal  Abdominal pain, epigastric - better weaning   nexium some  ranitidine other days  - Plan: US Renal  Encounter for immunization  Medication management - Plan: Basic metabolic panel, Protein / creatinine ratio, urine  Serum creatinine raised - Plan: US Renal  Persistent disorder of initiating or maintaining sleep - remeron seems to help  -Patient advised to return or notify  health care team  if symptoms worsen ,persist or new concerns arise.  Patient Instructions  Stay off of the celebrex  .and any  Medications like advil Tori Milks which  Can be toxic to the kidneys   Your kidney function continues to be decreased  .   Plan to  get ultrasound of the kidney   You will be notified about appt for this.   If ongoing problem we may get renal doctors involved .   i think ok to  Use the remeron if helps but ask dr Burr Medico  About this also  Gals you stomach is doing ok  Wean nexium as tolerated .     Standley Brooking. Panosh M.D.

## 2015-05-17 LAB — PROTEIN / CREATININE RATIO, URINE
Creatinine, Urine: 83.7 mg/dL
PROTEIN CREATININE RATIO: 0.32 — AB (ref <0.15)
TOTAL PROTEIN, URINE: 27 mg/dL — AB (ref 5–24)

## 2015-05-18 ENCOUNTER — Other Ambulatory Visit: Payer: Medicare Other

## 2015-05-18 DIAGNOSIS — M25641 Stiffness of right hand, not elsewhere classified: Secondary | ICD-10-CM | POA: Diagnosis not present

## 2015-05-18 DIAGNOSIS — M24541 Contracture, right hand: Secondary | ICD-10-CM | POA: Diagnosis not present

## 2015-05-23 ENCOUNTER — Ambulatory Visit
Admission: RE | Admit: 2015-05-23 | Discharge: 2015-05-23 | Disposition: A | Discharge status: Home / Self Care | Payer: Medicare Other | Source: Ambulatory Visit | Attending: Internal Medicine | Admitting: Internal Medicine

## 2015-05-23 DIAGNOSIS — R7989 Other specified abnormal findings of blood chemistry: Secondary | ICD-10-CM

## 2015-05-23 DIAGNOSIS — R1013 Epigastric pain: Secondary | ICD-10-CM

## 2015-05-23 DIAGNOSIS — N289 Disorder of kidney and ureter, unspecified: Secondary | ICD-10-CM

## 2015-05-23 DIAGNOSIS — R748 Abnormal levels of other serum enzymes: Secondary | ICD-10-CM | POA: Diagnosis not present

## 2015-05-30 ENCOUNTER — Telehealth: Payer: Self-pay | Admitting: Family Medicine

## 2015-05-30 DIAGNOSIS — M25641 Stiffness of right hand, not elsewhere classified: Secondary | ICD-10-CM | POA: Diagnosis not present

## 2015-05-30 DIAGNOSIS — M24541 Contracture, right hand: Secondary | ICD-10-CM | POA: Diagnosis not present

## 2015-05-30 NOTE — Telephone Encounter (Signed)
Pt calling for results on renal ultrasound performed 05/23/15.  Please advise.  Thanks!

## 2015-05-31 DIAGNOSIS — H01021 Squamous blepharitis right upper eyelid: Secondary | ICD-10-CM | POA: Diagnosis not present

## 2015-05-31 DIAGNOSIS — H01022 Squamous blepharitis right lower eyelid: Secondary | ICD-10-CM | POA: Diagnosis not present

## 2015-05-31 DIAGNOSIS — H01025 Squamous blepharitis left lower eyelid: Secondary | ICD-10-CM | POA: Diagnosis not present

## 2015-05-31 DIAGNOSIS — H2512 Age-related nuclear cataract, left eye: Secondary | ICD-10-CM | POA: Diagnosis not present

## 2015-05-31 DIAGNOSIS — H01024 Squamous blepharitis left upper eyelid: Secondary | ICD-10-CM | POA: Diagnosis not present

## 2015-05-31 DIAGNOSIS — H16102 Unspecified superficial keratitis, left eye: Secondary | ICD-10-CM | POA: Diagnosis not present

## 2015-05-31 DIAGNOSIS — Z961 Presence of intraocular lens: Secondary | ICD-10-CM | POA: Diagnosis not present

## 2015-05-31 NOTE — Telephone Encounter (Signed)
Pt notified of results by telephone. 

## 2015-05-31 NOTE — Telephone Encounter (Signed)
Left a message for a return call on home phone. 

## 2015-05-31 NOTE — Telephone Encounter (Signed)
Her renal US is normal for her age

## 2015-06-06 DIAGNOSIS — M19041 Primary osteoarthritis, right hand: Secondary | ICD-10-CM | POA: Diagnosis not present

## 2015-06-06 DIAGNOSIS — M24541 Contracture, right hand: Secondary | ICD-10-CM | POA: Diagnosis not present

## 2015-06-07 ENCOUNTER — Other Ambulatory Visit: Payer: Self-pay | Admitting: Internal Medicine

## 2015-06-07 NOTE — Telephone Encounter (Signed)
Sent to the pharmacy by e-scribe. 

## 2015-06-08 ENCOUNTER — Other Ambulatory Visit: Payer: Self-pay | Admitting: Family Medicine

## 2015-06-08 DIAGNOSIS — R7989 Other specified abnormal findings of blood chemistry: Secondary | ICD-10-CM

## 2015-06-08 DIAGNOSIS — N189 Chronic kidney disease, unspecified: Secondary | ICD-10-CM

## 2015-06-08 NOTE — Telephone Encounter (Signed)
Pt needs 90 day supply

## 2015-06-09 MED ORDER — ZOLPIDEM TARTRATE 10 MG PO TABS: ORAL_TABLET | ORAL | Status: DC

## 2015-06-09 NOTE — Telephone Encounter (Signed)
Called to the pharmacy by e-scribe.

## 2015-06-09 NOTE — Telephone Encounter (Signed)
Sig changed to say aboid regular use  Take 5 mg  Disp 45 ( 90 days)

## 2015-06-13 DIAGNOSIS — M24541 Contracture, right hand: Secondary | ICD-10-CM | POA: Diagnosis not present

## 2015-06-13 DIAGNOSIS — M19041 Primary osteoarthritis, right hand: Secondary | ICD-10-CM | POA: Diagnosis not present

## 2015-06-14 ENCOUNTER — Encounter: Payer: Self-pay | Admitting: Internal Medicine

## 2015-06-14 DIAGNOSIS — M24541 Contracture, right hand: Secondary | ICD-10-CM | POA: Diagnosis not present

## 2015-06-14 DIAGNOSIS — M19041 Primary osteoarthritis, right hand: Secondary | ICD-10-CM | POA: Diagnosis not present

## 2015-06-20 ENCOUNTER — Other Ambulatory Visit (INDEPENDENT_AMBULATORY_CARE_PROVIDER_SITE_OTHER): Payer: Medicare Other

## 2015-06-20 DIAGNOSIS — R748 Abnormal levels of other serum enzymes: Secondary | ICD-10-CM

## 2015-06-20 DIAGNOSIS — N189 Chronic kidney disease, unspecified: Secondary | ICD-10-CM | POA: Diagnosis not present

## 2015-06-20 DIAGNOSIS — R7989 Other specified abnormal findings of blood chemistry: Secondary | ICD-10-CM | POA: Diagnosis not present

## 2015-06-20 LAB — BASIC METABOLIC PANEL
BUN: 16 mg/dL (ref 6–23)
CALCIUM: 9.3 mg/dL (ref 8.4–10.5)
CO2: 27 meq/L (ref 19–32)
Chloride: 107 mEq/L (ref 96–112)
Creatinine, Ser: 1.11 mg/dL (ref 0.40–1.20)
GFR: 50.3 mL/min — ABNORMAL LOW (ref >60.00)
GLUCOSE: 96 mg/dL (ref 70–99)
Potassium: 4.3 mEq/L (ref 3.5–5.1)
SODIUM: 142 meq/L (ref 135–145)

## 2015-06-21 LAB — PROTEIN / CREATININE RATIO, URINE
CREATININE, URINE: 105.4 mg/dL
PROTEIN CREATININE RATIO: 0.09 (ref <0.15)
TOTAL PROTEIN, URINE: 9 mg/dL (ref 5–24)

## 2015-06-27 ENCOUNTER — Encounter: Payer: Medicare Other | Admitting: Internal Medicine

## 2015-06-27 NOTE — Progress Notes (Signed)
Document opened and reviewed for OV but appt  canceled same day .  

## 2015-06-27 NOTE — Patient Instructions (Signed)
   Your kidney function has improved  Since last check .

## 2015-07-04 ENCOUNTER — Ambulatory Visit (INDEPENDENT_AMBULATORY_CARE_PROVIDER_SITE_OTHER): Payer: Medicare Other | Admitting: Internal Medicine

## 2015-07-04 ENCOUNTER — Other Ambulatory Visit: Payer: Self-pay | Admitting: Internal Medicine

## 2015-07-04 ENCOUNTER — Encounter: Payer: Self-pay | Admitting: Internal Medicine

## 2015-07-04 VITALS — BP 136/74 | Temp 98.7°F | Ht 65.0 in | Wt 157.3 lb

## 2015-07-04 DIAGNOSIS — Z79899 Other long term (current) drug therapy: Secondary | ICD-10-CM | POA: Diagnosis not present

## 2015-07-04 DIAGNOSIS — F4322 Adjustment disorder with anxiety: Secondary | ICD-10-CM

## 2015-07-04 DIAGNOSIS — N289 Disorder of kidney and ureter, unspecified: Secondary | ICD-10-CM

## 2015-07-04 DIAGNOSIS — M2550 Pain in unspecified joint: Secondary | ICD-10-CM | POA: Diagnosis not present

## 2015-07-04 MED ORDER — ESOMEPRAZOLE MAGNESIUM 40 MG PO CPDR: 40.0000 mg | DELAYED_RELEASE_CAPSULE | Freq: Every day | ORAL | Status: DC

## 2015-07-04 MED ORDER — SERTRALINE HCL 100 MG PO TABS: 100.0000 mg | ORAL_TABLET | Freq: Every day | ORAL | Status: DC

## 2015-07-04 MED ORDER — ROSUVASTATIN CALCIUM 5 MG PO TABS: 5.0000 mg | ORAL_TABLET | Freq: Every day | ORAL | Status: DC

## 2015-07-04 NOTE — Patient Instructions (Addendum)
Glad your kidney function is better  Stay off the celebrex   Tylenol ok.   ? If you have RA then there may be other interventions to consider   And should  Get a  fu with Dr Trudie Reed .  Ok to refill   Sertraline  crestor and nexium .   Plan wellness visit  And med fu in February /March 17

## 2015-07-04 NOTE — Progress Notes (Signed)
Pre visit review using our clinic review tool, if applicable. No additional management support is needed unless otherwise documented below in the visit note.  Chief Complaint  Patient presents with  . Follow-up    HPI: Jeanne Tran 81 y.o. comes in for  Fy lab renal insufficiency  Has been off celebrex for months  . Feels ok active  No new sx .   arthritis  Takes tylenol if needed . Had seen and surgeon referred by dr Trudie Reed  And had PT so now can grasp with right hand but still has contractures.  Uncertain if she supposed to go back . To Dr.   Trudie Reed  Who has left that practice  ? If has RA plus  OA   Wants refill gi med and crestor  Denies se  Of these  Sertraline helps  And wants to remain on this  Needs refill  ROS: See pertinent positives and negatives per HPI.  Past Medical History  Diagnosis Date  . Hyperlipidemia   . RLS (restless legs syndrome)   . Breast cancer (HCC)     hx of left with radiation and lumpectomy  . History of colonic polyps   . GERD (gastroesophageal reflux disease)   . History of ERCP     and sphincterotomy for abnormal lfts and dilated biliary ducts 5/05  . Pulmonary nodule   . Migraine headache   . Wears contact lenses     No family history on file.  Social History   Social History  . Marital Status: Widowed    Spouse Name: N/A  . Number of Children: N/A  . Years of Education: N/A   Social History Main Topics  . Smoking status: Former Smoker -- 3.00 packs/day for 25 years    Types: Cigarettes    Quit date: 02/26/1994  . Smokeless tobacco: Never Used  . Alcohol Use: No  . Drug Use: No  . Sexual Activity: Not Asked   Other Topics Concern  . 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                    Outpatient Prescriptions Prior to Visit  Medication Sig Dispense Refill  . anastrozole (ARIMIDEX) 1 MG tablet Take 1 tablet (1 mg total) by mouth daily.  90 tablet 2  . BIOTIN PO Take 2,000 mcg by mouth daily.    Marland Kitchen BLEPHAMIDE S.O.P. ophthalmic ointment     . cetirizine (ZYRTEC) 10 MG tablet Take 10 mg by mouth daily.    . Cholecalciferol (VITAMIN D3) 2000 UNITS TABS Take 1 tablet by mouth daily.    . diazepam (VALIUM) 5 MG tablet Take 1-2 tablets before procedure 6 tablet 0  . dicyclomine (BENTYL) 20 MG tablet Take 1 tablet (20 mg total) by mouth 2 (two) times daily. 180 tablet 2  . DUREZOL 0.05 % EMUL     . fish oil-omega-3 fatty acids 1000 MG capsule Take 1 g by mouth daily.     Marland Kitchen LOTEMAX 0.5 % GEL instill 1 drop into left eye three times a day  0  . mirtazapine (REMERON) 15 MG tablet Take 1 tablet (15 mg total) by mouth at bedtime. Can increase to 2 hs for sleep 180 tablet 1  . Multiple Vitamin (MULTIVITAMIN) tablet Take 1 tablet by mouth daily.    Marland Kitchen  ofloxacin (OCUFLOX) 0.3 % ophthalmic solution Place 1 drop into the left eye 3 (three) times daily.   0  . Probiotic Product ([employer] PROBIOTIC) CHEW Chew 2 capsules by mouth daily.    . ranitidine (ZANTAC) 150 MG tablet take 1 tablet by mouth at bedtime MAY INCREASE TO 2 TABLETS AT BEDTIME 60 tablet 3  . zolpidem (AMBIEN) 10 MG tablet 5 mg hs po  if needed for sleep .Avoid daily use. 45 tablet 0  . CRESTOR 5 MG tablet take 1 tablet by mouth once daily 90 tablet 2  . esomeprazole (NEXIUM) 40 MG capsule take 1 capsule by mouth once daily 90 capsule 1  . sertraline (ZOLOFT) 100 MG tablet Take 1 tablet (100 mg total) by mouth daily. 90 tablet 0   No facility-administered medications prior to visit.     EXAM:  BP 136/74 mmHg  Temp(Src) 98.7 F (37.1 C) (Oral)  Ht 5\' 5"  (1.651 m)  Wt 157 lb 4.8 oz (71.351 kg)  BMI 26.18 kg/m2  Body mass index is 26.18 kg/(m^2).  GENERAL: vitals reviewed and listed above, alert, oriented, appears well hydrated and in no acute distress HEENT: atraumatic, conjunctiva  clear, no obvious abnormalities on inspection of external nose and ears  NECK: no obvious  masses on inspection palpation  LUNGS: clear to auscultation bilaterally, no wheezes, rales or rhonchi, good air movement CV: HRRR, no clubbing cyanosis or  peripheral edema nl cap refill  MS: moves all extremities  Right hand contracted mid and ring finger but nl grasp  Cannot extend fingers  Deformities noted right more than left.  PSYCH: pleasant and cooperative, no obvious depression or anxiety Lab Results  Component Value Date   WBC 6.3 05/09/2015   HGB 13.5 05/09/2015   HCT 40.5 05/09/2015   PLT 163 05/09/2015   GLUCOSE 96 06/20/2015   CHOL 176 10/18/2014   TRIG 164.0* 10/18/2014   HDL 73.40 10/18/2014   LDLCALC 70 10/18/2014   ALT 18 05/09/2015   AST 22 05/09/2015   NA 142 06/20/2015   K 4.3 06/20/2015   CL 107 06/20/2015   CREATININE 1.11 06/20/2015   BUN 16 06/20/2015   CO2 27 06/20/2015   TSH 3.37 10/18/2014   INR 1.00 06/29/2014   HGBA1C 6.3 12/14/2013   Lab Results  Component Value Date   CREATININE 1.11 06/20/2015   CREATININE 1.31* 05/15/2015   CREATININE 1.4* 05/09/2015    ASSESSMENT AND PLAN:  Discussed the following assessment and plan:  Renal insufficiency - better stay off the celebrex  tylenol or other  per rheum   Multiple joint pain w deformity - ? dx RA plus OA unclear from record get back with dr Trudie Reed for fu contractures righ hand better with PPT  Medication management - erfill sert nexium for now and readdress at fu and  crestor   Adjustment disorder with anxious mood - Plan: sertraline (ZOLOFT) 100 MG tablet  -Patient advised to return or notify health care team  if symptoms worsen ,persist or new concerns arise.  Patient Instructions  Glad your kidney function is better  Stay off the celebrex   Tylenol ok.   ? If you have RA then there may be other interventions to consider   And should  Get a  fu with Dr Trudie Reed .  Ok to refill   Sertraline  crestor and nexium .   Plan wellness visit  And med fu in February /March 17  Standley Brooking. Paulita Licklider M.D.

## 2015-07-05 DIAGNOSIS — Z853 Personal history of malignant neoplasm of breast: Secondary | ICD-10-CM | POA: Diagnosis not present

## 2015-07-05 DIAGNOSIS — N63 Unspecified lump in breast: Secondary | ICD-10-CM | POA: Diagnosis not present

## 2015-07-05 NOTE — Telephone Encounter (Signed)
Spoke to the pt.  She is not taking this medication.  Request denied.

## 2015-07-21 ENCOUNTER — Ambulatory Visit (INDEPENDENT_AMBULATORY_CARE_PROVIDER_SITE_OTHER): Payer: Medicare Other | Admitting: Internal Medicine

## 2015-07-21 ENCOUNTER — Encounter: Payer: Self-pay | Admitting: Internal Medicine

## 2015-07-21 VITALS — BP 110/80 | HR 83 | Temp 98.2°F | Ht 65.0 in | Wt 155.0 lb

## 2015-07-21 DIAGNOSIS — J019 Acute sinusitis, unspecified: Secondary | ICD-10-CM | POA: Diagnosis not present

## 2015-07-21 MED ORDER — AMOXICILLIN-POT CLAVULANATE 875-125 MG PO TABS: 1.0000 | ORAL_TABLET | Freq: Two times a day (BID) | ORAL | Status: DC

## 2015-07-21 NOTE — Progress Notes (Signed)
Pre visit review using our clinic review tool, if applicable. No additional management support is needed unless otherwise documented below in the visit note.  Chief Complaint  Patient presents with  . Nasal Congestion  . SINUS PRESSURE/PAIN    HPI: Patient Jeanne Tran  comes in today for SDA for  new problem evaluation. Uri sinus congestion with green discharge for a week   Tender face   right side more clogged  starting to drain   This am pain  Pressure right  some better    Using x rtra strength tylenol  No decon  No fever cp sob ROS: See pertinent positives and negatives per HPI. Using heat and cold packs   Past Medical History  Diagnosis Date  . Hyperlipidemia   . RLS (restless legs syndrome)   . Breast cancer (HCC)     hx of left with radiation and lumpectomy  . History of colonic polyps   . GERD (gastroesophageal reflux disease)   . History of ERCP     and sphincterotomy for abnormal lfts and dilated biliary ducts 5/05  . Pulmonary nodule   . Migraine headache   . Wears contact lenses     No family history on file.  Social History   Social History  . Marital Status: Widowed    Spouse Name: N/A  . Number of Children: N/A  . Years of Education: N/A   Social History Main Topics  . Smoking status: Former Smoker -- 3.00 packs/day for 25 years    Types: Cigarettes    Quit date: 02/26/1994  . Smokeless tobacco: Never Used  . Alcohol Use: No  . Drug Use: No  . Sexual Activity: Not Asked   Other Topics Concern  . 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                    Outpatient Prescriptions Prior to Visit  Medication Sig Dispense Refill  . anastrozole (ARIMIDEX) 1 MG tablet Take 1 tablet (1 mg total) by mouth daily. 90 tablet 2  . BIOTIN PO Take 2,000 mcg by mouth daily.    Marland Kitchen BLEPHAMIDE S.O.P. ophthalmic ointment     . cetirizine (ZYRTEC) 10 MG tablet Take 10 mg by mouth  daily.    . Cholecalciferol (VITAMIN D3) 2000 UNITS TABS Take 1 tablet by mouth daily.    . diazepam (VALIUM) 5 MG tablet Take 1-2 tablets before procedure 6 tablet 0  . dicyclomine (BENTYL) 20 MG tablet Take 1 tablet (20 mg total) by mouth 2 (two) times daily. 180 tablet 2  . DUREZOL 0.05 % EMUL     . esomeprazole (NEXIUM) 40 MG capsule Take 1 capsule (40 mg total) by mouth daily. 90 capsule 1  . fish oil-omega-3 fatty acids 1000 MG capsule Take 1 g by mouth daily.     Marland Kitchen LOTEMAX 0.5 % GEL instill 1 drop into left eye three times a day  0  . mirtazapine (REMERON) 15 MG tablet Take 1 tablet (15 mg total) by mouth at bedtime. Can increase to 2 hs for sleep 180 tablet 1  . Multiple Vitamin (MULTIVITAMIN) tablet Take 1 tablet by mouth daily.    . Probiotic Product ([employer]S PROBIOTIC) CHEW Chew 2 capsules by mouth daily.    . ranitidine (ZANTAC) 150 MG tablet take 1  tablet by mouth at bedtime MAY INCREASE TO 2 TABLETS AT BEDTIME 60 tablet 3  . rosuvastatin (CRESTOR) 5 MG tablet Take 1 tablet (5 mg total) by mouth daily. 90 tablet 1  . sertraline (ZOLOFT) 100 MG tablet Take 1 tablet (100 mg total) by mouth daily. 90 tablet 1  . zolpidem (AMBIEN) 10 MG tablet 5 mg hs po  if needed for sleep .Avoid daily use. 45 tablet 0  . ofloxacin (OCUFLOX) 0.3 % ophthalmic solution Place 1 drop into the left eye 3 (three) times daily.   0   No facility-administered medications prior to visit.     EXAM:  BP 110/80 mmHg  Pulse 83  Temp(Src) 98.2 F (36.8 C) (Oral)  Ht 5\' 5"  (1.651 m)  Wt 155 lb (70.308 kg)  BMI 25.79 kg/m2  SpO2 97%  Body mass index is 25.79 kg/(m^2). WDWN in NAD  quiet respirations; mildly congested  somewhat hoarse. Non toxic . HEENT: Normocephalic ;atraumatic , Eyes;  PERRL, EOMs  Full, lids and conjunctiva clear,,Ears: no deformities, canals nl, TM landmarks normal, Nose: no deformity or discharge but congested;face right frontal maxilla  tender Mouth : OP clear without lesion or edema  . Neck: Supple without adenopathy or masses or bruits Chest:  Clear to A&P without wheezes rales or rhonchi CV:  S1-S2 no gallops or murmurs peripheral perfusion is normal Skin :nl perfusion and no acute rashes  PSYCH: pleasant and cooperative, no obvious depression or anxiety  ASSESSMENT AND PLAN:  Discussed the following assessment and plan:  Acute sinusitis, recurrence not specified, unspecified location right frontal max   Risk benefit of med disc  Add saline afrrin and then antibiotic for criteria  Over weekend.  -Patient advised to return or notify health care team  if symptoms worsen ,persist or new concerns arise. Total visit 84mins > 50% spent counseling and coordinating care as indicated in above note and in instructions to patient .   Patient Instructions  Saline  Nose spray and  Can try afrin nose spray to relieve pressure  For no more than 3 days .  Continue warm compresses.   If  persistent or progressive  Then can add antibiotic  Or if relapsing sx   Fever or sever pain .   Sinusitis, Adult Sinusitis is redness, soreness, and inflammation of the paranasal sinuses. Paranasal sinuses are air pockets within the bones of your face. They are located beneath your eyes, in the middle of your forehead, and above your eyes. In healthy paranasal sinuses, mucus is able to drain out, and air is able to circulate through them by way of your nose. However, when your paranasal sinuses are inflamed, mucus and air can become trapped. This can allow bacteria and other germs to grow and cause infection. Sinusitis can develop quickly and last only a short time (acute) or continue over a long period (chronic). Sinusitis that lasts for more than 12 weeks is considered chronic. CAUSES Causes of sinusitis include:  Allergies.  Structural abnormalities, such as displacement of the cartilage that separates your nostrils (deviated septum), which can decrease the air flow through your nose and  sinuses and affect sinus drainage.  Functional abnormalities, such as when the small hairs (cilia) that line your sinuses and help remove mucus do not work properly or are not present. SIGNS AND SYMPTOMS Symptoms of acute and chronic sinusitis are the same. The primary symptoms are pain and pressure around the affected sinuses. Other symptoms include:  Upper toothache.  Earache.  Headache.  Bad breath.  Decreased sense of smell and taste.  A cough, which worsens when you are lying flat.  Fatigue.  Fever.  Thick drainage from your nose, which often is green and may contain pus (purulent).  Swelling and warmth over the affected sinuses. DIAGNOSIS Your health care provider will perform a physical exam. During your exam, your health care provider may perform any of the following to help determine if you have acute sinusitis or chronic sinusitis:  Look in your nose for signs of abnormal growths in your nostrils (nasal polyps).  Tap over the affected sinus to check for signs of infection.  View the inside of your sinuses using an imaging device that has a light attached (endoscope). If your health care provider suspects that you have chronic sinusitis, one or more of the following tests may be recommended:  Allergy tests.  Nasal culture. A sample of mucus is taken from your nose, sent to a lab, and screened for bacteria.  Nasal cytology. A sample of mucus is taken from your nose and examined by your health care provider to determine if your sinusitis is related to an allergy. TREATMENT Most cases of acute sinusitis are related to a viral infection and will resolve on their own within 10 days. Sometimes, medicines are prescribed to help relieve symptoms of both acute and chronic sinusitis. These may include pain medicines, decongestants, nasal steroid sprays, or saline sprays. However, for sinusitis related to a bacterial infection, your health care provider will prescribe  antibiotic medicines. These are medicines that will help kill the bacteria causing the infection. Rarely, sinusitis is caused by a fungal infection. In these cases, your health care provider will prescribe antifungal medicine. For some cases of chronic sinusitis, surgery is needed. Generally, these are cases in which sinusitis recurs more than 3 times per year, despite other treatments. HOME CARE INSTRUCTIONS  Drink plenty of water. Water helps thin the mucus so your sinuses can drain more easily.  Use a humidifier.  Inhale steam 3-4 times a day (for example, sit in the bathroom with the shower running).  Apply a warm, moist washcloth to your face 3-4 times a day, or as directed by your health care provider.  Use saline nasal sprays to help moisten and clean your sinuses.  Take medicines only as directed by your health care provider.  If you were prescribed either an antibiotic or antifungal medicine, finish it all even if you start to feel better. SEEK IMMEDIATE MEDICAL CARE IF:  You have increasing pain or severe headaches.  You have nausea, vomiting, or drowsiness.  You have swelling around your face.  You have vision problems.  You have a stiff neck.  You have difficulty breathing.   This information is not intended to replace advice given to you by your health care provider. Make sure you discuss any questions you have with your health care provider.   Document Released: 09/02/2005 Document Revised: 09/23/2014 Document Reviewed: 09/17/2011 Elsevier Interactive Patient Education 2016 [place]. Tenna Lacko M.D.

## 2015-07-21 NOTE — Patient Instructions (Signed)
Saline  Nose spray and  Can try afrin nose spray to relieve pressure  For no more than 3 days .  Continue warm compresses.   If  persistent or progressive  Then can add antibiotic  Or if relapsing sx   Fever or sever pain .   Sinusitis, Adult Sinusitis is redness, soreness, and inflammation of the paranasal sinuses. Paranasal sinuses are air pockets within the bones of your face. They are located beneath your eyes, in the middle of your forehead, and above your eyes. In healthy paranasal sinuses, mucus is able to drain out, and air is able to circulate through them by way of your nose. However, when your paranasal sinuses are inflamed, mucus and air can become trapped. This can allow bacteria and other germs to grow and cause infection. Sinusitis can develop quickly and last only a short time (acute) or continue over a long period (chronic). Sinusitis that lasts for more than 12 weeks is considered chronic. CAUSES Causes of sinusitis include:  Allergies.  Structural abnormalities, such as displacement of the cartilage that separates your nostrils (deviated septum), which can decrease the air flow through your nose and sinuses and affect sinus drainage.  Functional abnormalities, such as when the small hairs (cilia) that line your sinuses and help remove mucus do not work properly or are not present. SIGNS AND SYMPTOMS Symptoms of acute and chronic sinusitis are the same. The primary symptoms are pain and pressure around the affected sinuses. Other symptoms include:  Upper toothache.  Earache.  Headache.  Bad breath.  Decreased sense of smell and taste.  A cough, which worsens when you are lying flat.  Fatigue.  Fever.  Thick drainage from your nose, which often is green and may contain pus (purulent).  Swelling and warmth over the affected sinuses. DIAGNOSIS Your health care provider will perform a physical exam. During your exam, your health care provider may perform any of  the following to help determine if you have acute sinusitis or chronic sinusitis:  Look in your nose for signs of abnormal growths in your nostrils (nasal polyps).  Tap over the affected sinus to check for signs of infection.  View the inside of your sinuses using an imaging device that has a light attached (endoscope). If your health care provider suspects that you have chronic sinusitis, one or more of the following tests may be recommended:  Allergy tests.  Nasal culture. A sample of mucus is taken from your nose, sent to a lab, and screened for bacteria.  Nasal cytology. A sample of mucus is taken from your nose and examined by your health care provider to determine if your sinusitis is related to an allergy. TREATMENT Most cases of acute sinusitis are related to a viral infection and will resolve on their own within 10 days. Sometimes, medicines are prescribed to help relieve symptoms of both acute and chronic sinusitis. These may include pain medicines, decongestants, nasal steroid sprays, or saline sprays. However, for sinusitis related to a bacterial infection, your health care provider will prescribe antibiotic medicines. These are medicines that will help kill the bacteria causing the infection. Rarely, sinusitis is caused by a fungal infection. In these cases, your health care provider will prescribe antifungal medicine. For some cases of chronic sinusitis, surgery is needed. Generally, these are cases in which sinusitis recurs more than 3 times per year, despite other treatments. HOME CARE INSTRUCTIONS  Drink plenty of water. Water helps thin the mucus so your sinuses can  drain more easily.  Use a humidifier.  Inhale steam 3-4 times a day (for example, sit in the bathroom with the shower running).  Apply a warm, moist washcloth to your face 3-4 times a day, or as directed by your health care provider.  Use saline nasal sprays to help moisten and clean your sinuses.  Take  medicines only as directed by your health care provider.  If you were prescribed either an antibiotic or antifungal medicine, finish it all even if you start to feel better. SEEK IMMEDIATE MEDICAL CARE IF:  You have increasing pain or severe headaches.  You have nausea, vomiting, or drowsiness.  You have swelling around your face.  You have vision problems.  You have a stiff neck.  You have difficulty breathing.   This information is not intended to replace advice given to you by your health care provider. Make sure you discuss any questions you have with your health care provider.   Document Released: 09/02/2005 Document Revised: 09/23/2014 Document Reviewed: 09/17/2011 Elsevier Interactive Patient Education [employer].

## 2015-07-24 DIAGNOSIS — L718 Other rosacea: Secondary | ICD-10-CM | POA: Diagnosis not present

## 2015-07-24 DIAGNOSIS — L719 Rosacea, unspecified: Secondary | ICD-10-CM | POA: Diagnosis not present

## 2015-07-24 DIAGNOSIS — H16103 Unspecified superficial keratitis, bilateral: Secondary | ICD-10-CM | POA: Diagnosis not present

## 2015-08-07 DIAGNOSIS — H01022 Squamous blepharitis right lower eyelid: Secondary | ICD-10-CM | POA: Diagnosis not present

## 2015-08-07 DIAGNOSIS — H01021 Squamous blepharitis right upper eyelid: Secondary | ICD-10-CM | POA: Diagnosis not present

## 2015-08-07 DIAGNOSIS — H01025 Squamous blepharitis left lower eyelid: Secondary | ICD-10-CM | POA: Diagnosis not present

## 2015-08-07 DIAGNOSIS — Z961 Presence of intraocular lens: Secondary | ICD-10-CM | POA: Diagnosis not present

## 2015-08-07 DIAGNOSIS — H16102 Unspecified superficial keratitis, left eye: Secondary | ICD-10-CM | POA: Diagnosis not present

## 2015-08-07 DIAGNOSIS — H01024 Squamous blepharitis left upper eyelid: Secondary | ICD-10-CM | POA: Diagnosis not present

## 2015-08-07 DIAGNOSIS — H2512 Age-related nuclear cataract, left eye: Secondary | ICD-10-CM | POA: Diagnosis not present

## 2015-08-28 DIAGNOSIS — H2512 Age-related nuclear cataract, left eye: Secondary | ICD-10-CM | POA: Diagnosis not present

## 2015-08-28 DIAGNOSIS — C50412 Malignant neoplasm of upper-outer quadrant of left female breast: Secondary | ICD-10-CM | POA: Diagnosis not present

## 2015-08-31 ENCOUNTER — Other Ambulatory Visit: Payer: Self-pay | Admitting: Optometry

## 2015-08-31 DIAGNOSIS — H1089 Other conjunctivitis: Secondary | ICD-10-CM | POA: Diagnosis not present

## 2015-08-31 DIAGNOSIS — H109 Unspecified conjunctivitis: Secondary | ICD-10-CM | POA: Diagnosis not present

## 2015-08-31 DIAGNOSIS — H2512 Age-related nuclear cataract, left eye: Secondary | ICD-10-CM | POA: Diagnosis not present

## 2015-08-31 DIAGNOSIS — L121 Cicatricial pemphigoid: Secondary | ICD-10-CM | POA: Diagnosis not present

## 2015-08-31 DIAGNOSIS — H11442 Conjunctival cysts, left eye: Secondary | ICD-10-CM | POA: Diagnosis not present

## 2015-09-05 ENCOUNTER — Other Ambulatory Visit: Payer: Self-pay | Admitting: *Deleted

## 2015-09-05 DIAGNOSIS — C50912 Malignant neoplasm of unspecified site of left female breast: Secondary | ICD-10-CM

## 2015-09-07 ENCOUNTER — Other Ambulatory Visit (HOSPITAL_BASED_OUTPATIENT_CLINIC_OR_DEPARTMENT_OTHER): Payer: Medicare Other

## 2015-09-07 ENCOUNTER — Encounter: Payer: Self-pay | Admitting: Hematology

## 2015-09-07 ENCOUNTER — Telehealth: Payer: Self-pay | Admitting: Hematology

## 2015-09-07 ENCOUNTER — Ambulatory Visit (HOSPITAL_BASED_OUTPATIENT_CLINIC_OR_DEPARTMENT_OTHER): Payer: Medicare Other | Admitting: Hematology

## 2015-09-07 VITALS — BP 122/67 | HR 76 | Temp 98.4°F | Resp 18 | Ht 65.0 in | Wt 158.7 lb

## 2015-09-07 DIAGNOSIS — C50912 Malignant neoplasm of unspecified site of left female breast: Secondary | ICD-10-CM

## 2015-09-07 DIAGNOSIS — Z86000 Personal history of in-situ neoplasm of breast: Secondary | ICD-10-CM | POA: Diagnosis not present

## 2015-09-07 DIAGNOSIS — M8580 Other specified disorders of bone density and structure, unspecified site: Secondary | ICD-10-CM

## 2015-09-07 DIAGNOSIS — F4322 Adjustment disorder with anxiety: Secondary | ICD-10-CM

## 2015-09-07 LAB — COMPREHENSIVE METABOLIC PANEL
ALBUMIN: 3.8 g/dL (ref 3.5–5.0)
ALK PHOS: 113 U/L (ref 40–150)
ALT: 18 U/L (ref 0–55)
ANION GAP: 7 meq/L (ref 3–11)
AST: 25 U/L (ref 5–34)
BILIRUBIN TOTAL: 0.36 mg/dL (ref 0.20–1.20)
BUN: 22.8 mg/dL (ref 7.0–26.0)
CO2: 29 meq/L (ref 22–29)
Calcium: 9.4 mg/dL (ref 8.4–10.4)
Chloride: 106 mEq/L (ref 98–109)
Creatinine: 1.1 mg/dL (ref 0.6–1.1)
EGFR: 46 mL/min/{1.73_m2} — AB (ref >90)
Glucose: 73 mg/dl (ref 70–140)
POTASSIUM: 4.7 meq/L (ref 3.5–5.1)
Sodium: 142 mEq/L (ref 136–145)
TOTAL PROTEIN: 6.9 g/dL (ref 6.4–8.3)

## 2015-09-07 LAB — CBC WITH DIFFERENTIAL/PLATELET
BASO%: 1.1 % (ref 0.0–2.0)
Basophils Absolute: 0.1 10*3/uL (ref 0.0–0.1)
EOS ABS: 0.3 10*3/uL (ref 0.0–0.5)
EOS%: 4.7 % (ref 0.0–7.0)
HCT: 40.3 % (ref 34.8–46.6)
HGB: 13 g/dL (ref 11.6–15.9)
LYMPH%: 31.7 % (ref 14.0–49.7)
MCH: 31 pg (ref 25.1–34.0)
MCHC: 32.3 g/dL (ref 31.5–36.0)
MCV: 95.9 fL (ref 79.5–101.0)
MONO#: 0.7 10*3/uL (ref 0.1–0.9)
MONO%: 11 % (ref 0.0–14.0)
NEUT%: 51.5 % (ref 38.4–76.8)
NEUTROS ABS: 3.1 10*3/uL (ref 1.5–6.5)
PLATELETS: 190 10*3/uL (ref 145–400)
RBC: 4.2 10*6/uL (ref 3.70–5.45)
RDW: 13.5 % (ref 11.2–14.5)
WBC: 6.1 10*3/uL (ref 3.9–10.3)
lymph#: 1.9 10*3/uL (ref 0.9–3.3)

## 2015-09-07 MED ORDER — SERTRALINE HCL 100 MG PO TABS: 100.0000 mg | ORAL_TABLET | Freq: Every day | ORAL | Status: DC

## 2015-09-07 MED ORDER — ANASTROZOLE 1 MG PO TABS: 1.0000 mg | ORAL_TABLET | Freq: Every day | ORAL | Status: DC

## 2015-09-07 NOTE — Progress Notes (Signed)
[facility] NOTE  Patient Care Team: Burnis Medin, MD as PCP - General Lafayette Dragon, MD (Gastroenterology) Dyke Maes, OD (Optometry) Kary Kos, MD (Neurosurgery) [name], MD as Consulting Physician (General Surgery) Truitt Merle, MD as Consulting Physician (Hematology) Daryll Brod, MD as Consulting Physician (Orthopedic Surgery)  Oncology History   Diagnosed in 1995, s/p lumpectomy and 5 years Tamoxifen. Records not available      Cancer of left breast ([facility])   1995 Cancer Diagnosis History of left breast cancer, status post lumpectomy and 5 years tamoxifen.   06/08/2014 Initial Diagnosis Cancer of left breast   06/08/2014 Initial Biopsy Left breast biopsy showed DCIS.   07/01/2014 Surgery Left breast lumpectomy, surgical margins were negative.   08/10/2014 -  Anti-estrogen oral therapy She started adjuvant tamoxifen, and was subsequently switched to anastrozole a few months later due to her anti-depression medications Zoloft.    CHIEF COMPLAINTS:  Follow up left breast DCIS   HISTORY OF PRESENTING ILLNESS:  Jeanne Tran 81 y.o. female is here because of recent diagnosis of left breast DCIS.   She is referred by Dr. Zella Richer because of a new diagnosis of left breast DCIS. She has a previous history of left breast cancer in 1995 and was treated with lumpectomy, axillary lymph node dissection, radiation, and 7 years tamoxifen. She has been followed by yearly mammogram. She was noted to have a new mass in the left breast on recent mammography at the 12 to 1:00 position, middle depth. Image guided biopsy was performed with the above pathology. The tumor is ER and PR positive.   She was seen by Dr. Zella Richer. She declined mastectomy, underwent lumpectomy on 07/01/2014. The surgery went well without any complications. She has recovered well from the surgery, except mild fatigue after the surgery. She is able to function well at home. She denies any  pain, dyspnea, abdominal discomfort, or any other symptoms.  In terms of breast cancer risk profile:  She menarched at early age of 48 and went to menopause at age mind 40's.   She had 3 pregnancy, her first child was born at age 27.   She did not breast-fed her child.  She did received birth control pills for several years.  She was on hormone replacement therapy for 12-13 years.  She has no family history of Breast/GYN/GI cancer  CURRENT THERAPY: Anastrozole 1 mg once daily  INTERIM HISTORY: Makaya returns for follow-up. She is doing well overall. She has been tolerating anastrozole very well, mild hot flush, no other side effects.  She has good appetite and energy level, functions well at home.   MEDICAL HISTORY:  Past Medical History  Diagnosis Date  . Hyperlipidemia   . RLS (restless legs syndrome)   . Breast cancer ([facility])     hx of left with radiation and lumpectomy  . History of colonic polyps   . GERD (gastroesophageal reflux disease)   . History of ERCP     and sphincterotomy for abnormal lfts and dilated biliary ducts 5/05  . Pulmonary nodule   . Migraine headache   . Wears contact lenses     SURGICAL HISTORY: Past Surgical History  Procedure Laterality Date  . Ercp      for abnormal lfts and dilated biliary ducts 5/05  . Sphincterotomy      for abnormal lfts and dilated biliary ducts 5/05  . Lymph node biopsy    . Cystocele repair    .  Appendectomy    . Hemorrhoid surgery    . Tonsillectomy    . Cervical spine surgery  3/03  . Gallbladder duct open  2006  . Cholecystectomy  2012  . Orthoscopic rt knee    . Hysteroscopy    . Complete hysterectomy    . Breast lumpectomy  1995    lt lumpsnbx  . Abdominal hysterectomy      bso  . Breast lumpectomy with radioactive seed localization Left 07/01/2014    Procedure: LEFT BREAST LUMPECTOMY WITH RADIOACTIVE SEED LOCALIZATION;  Surgeon: [name], MD;  Location: [facility];  Service:  General;  Laterality: Left;    SOCIAL HISTORY: History   Social History  . Marital Status: Widowed    Spouse Name: N/A    Number of Children: N/A  . Years of Education: N/A  She lives alone at home.   Occupational History  . Not on file.   Social History Main Topics  . Smoking status: Former Smoker -- 3.00 packs/day for 25 years    Types: Cigarettes    Quit date: 02/26/1994  . Smokeless tobacco: Never Used  . Alcohol Use: No  . Drug Use: No  . 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                    FAMILY HISTORY: No family history of malignancy.  ALLERGIES:  is allergic to codeine.  MEDICATIONS:  Current Outpatient Prescriptions  Medication Sig Dispense Refill  . amoxicillin-clavulanate (AUGMENTIN) 875-125 MG tablet Take 1 tablet by mouth every 12 (twelve) hours. 14 tablet 0  . anastrozole (ARIMIDEX) 1 MG tablet Take 1 tablet (1 mg total) by mouth daily. 90 tablet 2  . BIOTIN PO Take 2,000 mcg by mouth daily.    Marland Kitchen BLEPHAMIDE S.O.P. ophthalmic ointment     . cetirizine (ZYRTEC) 10 MG tablet Take 10 mg by mouth daily.    . Cholecalciferol (VITAMIN D3) 2000 UNITS TABS Take 1 tablet by mouth daily.    . diazepam (VALIUM) 5 MG tablet Take 1-2 tablets before procedure 6 tablet 0  . dicyclomine (BENTYL) 20 MG tablet Take 1 tablet (20 mg total) by mouth 2 (two) times daily. 180 tablet 2  . DUREZOL 0.05 % EMUL     . esomeprazole (NEXIUM) 40 MG capsule Take 1 capsule (40 mg total) by mouth daily. 90 capsule 1  . fish oil-omega-3 fatty acids 1000 MG capsule Take 1 g by mouth daily.     Marland Kitchen LOTEMAX 0.5 % GEL instill 1 drop into left eye three times a day  0  . mirtazapine (REMERON) 15 MG tablet Take 1 tablet (15 mg total) by mouth at bedtime. Can increase to 2 hs for sleep 180 tablet 1  . Multiple Vitamin (MULTIVITAMIN) tablet Take 1 tablet by mouth daily.    .  Probiotic Product ([employer] PROBIOTIC) CHEW Chew 2 capsules by mouth daily.    . ranitidine (ZANTAC) 150 MG tablet take 1 tablet by mouth at bedtime MAY INCREASE TO 2 TABLETS AT BEDTIME 60 tablet 3  . rosuvastatin (CRESTOR) 5 MG tablet Take 1 tablet (5 mg total) by mouth daily. 90 tablet 1  . sertraline (ZOLOFT) 100 MG tablet Take 1 tablet (100 mg total) by mouth  daily. 90 tablet 1  . zolpidem (AMBIEN) 10 MG tablet 5 mg hs po  if needed for sleep .Avoid daily use. 45 tablet 0   No current facility-administered medications for this visit.    REVIEW OF SYSTEMS:   Constitutional: Denies fevers, chills or abnormal night sweats Eyes: Denies blurriness of vision, double vision or watery eyes Ears, nose, mouth, throat, and face: Denies mucositis or sore throat Respiratory: Denies cough, dyspnea or wheezes Cardiovascular: Denies palpitation, chest discomfort or lower extremity swelling Gastrointestinal:  Denies nausea, heartburn or change in bowel habits Skin: Denies abnormal skin rashes Lymphatics: Denies new lymphadenopathy or easy bruising Neurological:Denies numbness, tingling or new weaknesses Behavioral/Psych: Mood is stable, no new changes  All other systems were reviewed with the patient and are negative.  PHYSICAL EXAMINATION: ECOG PERFORMANCE STATUS: 0 - Asymptomatic  Filed Vitals:   09/07/15 1242  BP: 122/67  Pulse: 76  Temp: 98.4 F (36.9 C)  Resp: 18   Filed Weights   09/07/15 1242  Weight: 158 lb 11.2 oz (71.986 kg)    GENERAL:alert, no distress and comfortable SKIN: skin color, texture, turgor are normal, no rashes or significant lesions EYES: normal, conjunctiva are pink and non-injected, sclera clear OROPHARYNX:no exudate, no erythema and lips, buccal mucosa, and tongue normal  NECK: supple, thyroid normal size, non-tender, without nodularity LYMPH:  no palpable lymphadenopathy in the cervical, axillary or inguinal LUNGS: clear to auscultation and percussion with  normal breathing effort HEART: regular rate & rhythm and no murmurs and no lower extremity edema ABDOMEN:abdomen soft, non-tender and normal bowel sounds Musculoskeletal:no cyanosis of digits and no clubbing  PSYCH: alert & oriented x 3 with fluent speech NEURO: no focal motor/sensory deficits Breasts: Breast inspection showed a positive surgical scar at the left upper outer quadrant, well healed, no surrounding skin erythema or discharge. No skin change or nipple discharge. (+) fullness and firmness under the surgical scar, no palpable mass. Palpation of the right breasts and axilla revealed no obvious mass that I could appreciate.  LABORATORY DATA:  I have reviewed the data as listed  CBC Latest Ref Rng 09/07/2015 05/09/2015 02/06/2015  WBC 3.9 - 10.3 10e3/uL 6.1 6.3 6.5  Hemoglobin 11.6 - 15.9 g/dL 13.0 13.5 13.2  Hematocrit 34.8 - 46.6 % 40.3 40.5 40.1  Platelets 145 - 400 10e3/uL 190 163 208    CMP Latest Ref Rng 09/07/2015 06/20/2015 05/15/2015  Glucose 70 - 140 mg/dl 73 96 89  BUN 7.0 - 26.0 mg/dL 22.8 16 26(H)  Creatinine 0.6 - 1.1 mg/dL 1.1 1.11 1.31(H)  Sodium 136 - 145 mEq/L 142 142 137  Potassium 3.5 - 5.1 mEq/L 4.7 4.3 4.1  Chloride 96 - 112 mEq/L - 107 101  CO2 22 - 29 mEq/L 29 27 30   Calcium 8.4 - 10.4 mg/dL 9.4 9.3 9.6  Total Protein 6.4 - 8.3 g/dL 6.9 - -  Total Bilirubin 0.20 - 1.20 mg/dL 0.36 - -  Alkaline Phos 40 - 150 U/L 113 - -  AST 5 - 34 U/L 25 - -  ALT 0 - 55 U/L 18 - -    Pathology report 07/01/2014 FINAL DIAGNOSIS Diagnosis 1. Breast, lumpectomy, left - DUCTAL CARCINOMA IN SITU WITH CALCIFICATIONS, LOW GRADE, SPANNING 1.8 CM. - THE SURGICAL RESECTION MARGINS ARE NEGATIVE FOR CARCINOMA. - SEE ONCOLOGY TABLE BELOW. 2. Breast, excision, medial margin left - FIBROCYSTIC CHANGES. - THERE IS NO EVIDENCE OF MALIGNANCY. - SEE COMMENT. 3. Breast, excision, superior margin left - FIBROCYSTIC CHANGES. -  THERE IS NO EVIDENCE OF MALIGNANCY. - SEE  COMMENT. 4. Breast, excision, lateral margin left - FIBROCYSTIC CHANGES. - THERE IS NO EVIDENCE OF MALIGNANCY. - SEE COMMENT. 5. Breast, excision, inferior margin left - FIBROCYSTIC CHANGES. - THERE IS NO EVIDENCE OF MALIGNANCY. - SEE COMMENT. Microscopic Comment 1. BREAST, IN SITU CARCINOMA Specimen, including laterality: Left breast Procedure (include lymph node sampling sentinel-non-sentinel: Lumpectomy and additional margin resection x 4 Grade of carcinoma: Low grade Necrosis: Not identified. Estimated tumor size: (gross measurement): 1.8 cm Treatment effect: N/A Distance to closest margin: Greater than 0.2 cm to all margins Breast prognostic profile: [phone] 1 of 3 FINAL for BRIAHNA, MAHDAVI [id number]) Microscopic Comment(continued) Estrogen receptor: 100%, strong staining intensity Progesterone receptor: 100%, strong staining intensity TNM: pTis, pNX (JK:kh 07-04-14) 2. -5. The surgical resection  RADIOGRAPHIC STUDIES:  Bone density scan 11/15/2014 FINDINGS: AP LUMBAR SPINE L1-L4  Bone Mineral Density (BMD): 0.984 g/cm2  Young Adult T-Score: -0.6  Z-Score: 2.1  Left FEMUR neck  Bone Mineral Density (BMD): 0.700 g/cm2  Young Adult T-Score: -1.3  Z-Score: 0.9  ASSESSMENT: Patient's diagnostic category is LOW BONE MASS by WHO Criteria.  ASSESSMENT:  left breast DCIS s/p lumpectomy, ER/PR positive  PLAN:  #1 left breast DCIS, ER/PR positive The patient had early stage disease. She is considered to be cured by surgery alone.  Any form of adjuvant treatment is for prevention of disease recurrence.  Due to her prior left breast radiation history, no adjuvant radiation at this time I recommended adjuvant antiestrogen therapy to reduce risk of breast cancer in the future -She tolerated tamoxifen quite well, but she's not happy that her Zoloft was switched to Effexor. -So tamoxifen was switched to anastrozole. She is tolerating it well,  we will plan for total 5 years. -She will continue annual mammogram, and physical exam for cancer surveillance - I encourage you to continue healthy diet and exercise regularly. - today's lab result were reviewed with her, unremarkable  #2 osteopenia  No history of osteoporosis. I recommend her take calcium 1 g a day and vitamin D. -Bone density scan  on 11/15/2014 showed osteopenia. -I discussed the option of adding biphosphonate for her os opinion, she really doesn't want take additional medication. -I discussed anastrozole may potentially make her osteopenia worse. We'll follow her bone density scan in 2 years  #3 depression -continue zoloft   Follow-up: 6 months with lab. Continue anastrozole.    All questions were answered. The patient knows to call the clinic with any problems, questions or concerns. I spent 20 minutes counseling the patient face to face. The total time spent in the appointment was 25 minutes and more than 50% was on counseling.     Truitt Merle, MD 09/07/2015 11:19 AM

## 2015-09-07 NOTE — Telephone Encounter (Signed)
Gave pt appt for 03/07/16.

## 2015-09-13 ENCOUNTER — Emergency Department (HOSPITAL_COMMUNITY)
Admission: EM | Admit: 2015-09-13 | Discharge: 2015-09-14 | Disposition: A | Discharge status: Home / Self Care | Payer: Medicare Other | Attending: Emergency Medicine | Admitting: Emergency Medicine

## 2015-09-13 ENCOUNTER — Encounter (HOSPITAL_COMMUNITY): Payer: Self-pay | Admitting: *Deleted

## 2015-09-13 ENCOUNTER — Emergency Department (HOSPITAL_COMMUNITY): Payer: Medicare Other

## 2015-09-13 DIAGNOSIS — Z853 Personal history of malignant neoplasm of breast: Secondary | ICD-10-CM | POA: Insufficient documentation

## 2015-09-13 DIAGNOSIS — T148XXA Other injury of unspecified body region, initial encounter: Secondary | ICD-10-CM

## 2015-09-13 DIAGNOSIS — Z8679 Personal history of other diseases of the circulatory system: Secondary | ICD-10-CM | POA: Insufficient documentation

## 2015-09-13 DIAGNOSIS — Y998 Other external cause status: Secondary | ICD-10-CM | POA: Diagnosis not present

## 2015-09-13 DIAGNOSIS — S40211A Abrasion of right shoulder, initial encounter: Secondary | ICD-10-CM | POA: Insufficient documentation

## 2015-09-13 DIAGNOSIS — K219 Gastro-esophageal reflux disease without esophagitis: Secondary | ICD-10-CM | POA: Diagnosis not present

## 2015-09-13 DIAGNOSIS — Z79899 Other long term (current) drug therapy: Secondary | ICD-10-CM | POA: Insufficient documentation

## 2015-09-13 DIAGNOSIS — Z9012 Acquired absence of left breast and nipple: Secondary | ICD-10-CM | POA: Diagnosis not present

## 2015-09-13 DIAGNOSIS — Y9289 Other specified places as the place of occurrence of the external cause: Secondary | ICD-10-CM | POA: Insufficient documentation

## 2015-09-13 DIAGNOSIS — S51001A Unspecified open wound of right elbow, initial encounter: Secondary | ICD-10-CM | POA: Insufficient documentation

## 2015-09-13 DIAGNOSIS — W19XXXA Unspecified fall, initial encounter: Secondary | ICD-10-CM

## 2015-09-13 DIAGNOSIS — Z8669 Personal history of other diseases of the nervous system and sense organs: Secondary | ICD-10-CM | POA: Diagnosis not present

## 2015-09-13 DIAGNOSIS — Z87891 Personal history of nicotine dependence: Secondary | ICD-10-CM | POA: Diagnosis not present

## 2015-09-13 DIAGNOSIS — E785 Hyperlipidemia, unspecified: Secondary | ICD-10-CM | POA: Diagnosis not present

## 2015-09-13 DIAGNOSIS — S59901A Unspecified injury of right elbow, initial encounter: Secondary | ICD-10-CM | POA: Diagnosis present

## 2015-09-13 DIAGNOSIS — S60819A Abrasion of unspecified wrist, initial encounter: Secondary | ICD-10-CM | POA: Diagnosis not present

## 2015-09-13 DIAGNOSIS — Z923 Personal history of irradiation: Secondary | ICD-10-CM | POA: Insufficient documentation

## 2015-09-13 DIAGNOSIS — S50901A Unspecified superficial injury of right elbow, initial encounter: Secondary | ICD-10-CM | POA: Diagnosis not present

## 2015-09-13 DIAGNOSIS — Y9389 Activity, other specified: Secondary | ICD-10-CM | POA: Insufficient documentation

## 2015-09-13 DIAGNOSIS — W01198A Fall on same level from slipping, tripping and stumbling with subsequent striking against other object, initial encounter: Secondary | ICD-10-CM | POA: Diagnosis not present

## 2015-09-13 DIAGNOSIS — S60811A Abrasion of right wrist, initial encounter: Secondary | ICD-10-CM | POA: Diagnosis not present

## 2015-09-13 DIAGNOSIS — M25521 Pain in right elbow: Secondary | ICD-10-CM | POA: Diagnosis not present

## 2015-09-13 DIAGNOSIS — Z792 Long term (current) use of antibiotics: Secondary | ICD-10-CM | POA: Insufficient documentation

## 2015-09-13 DIAGNOSIS — Z86010 Personal history of colonic polyps: Secondary | ICD-10-CM | POA: Diagnosis not present

## 2015-09-13 DIAGNOSIS — S51011A Laceration without foreign body of right elbow, initial encounter: Secondary | ICD-10-CM | POA: Diagnosis not present

## 2015-09-13 MED ORDER — ACETAMINOPHEN 325 MG PO TABS
650.0000 mg | ORAL_TABLET | Freq: Once | ORAL | Status: AC
  Administered 2015-09-13: 650 mg via ORAL
  Filled 2015-09-13: qty 2

## 2015-09-13 NOTE — ED Notes (Signed)
Patient transported to X-ray 

## 2015-09-13 NOTE — ED Provider Notes (Signed)
CSN: [id number]     Arrival date & time 09/13/15  1713 History   First MD Initiated Contact with Patient 09/13/15 2225     Chief Complaint  Patient presents with  . Fall  . Extremity Laceration     (Consider location/radiation/quality/duration/timing/severity/associated sxs/prior Treatment) HPI   Jeanne Tran is a 81 y.o. female with PMH significant for HLD, RLS, GERD, migraine who presents with unwitnessed fall she sustained approximately 4:30 PM today.  Patient reports she turned too quickly and lost her balance causing her to fall, landing on her right shoulder and right elbow.  She denies head injury, LOC, syncope, dizziness, lightheadedness, visual disturbance, headache, N/V, neck pain, back pain, abdominal pain, numbness, tingling, or weakness.  No meds PTA.  No aggravating factors.  She is not on blood thinners.  She is now complaining of right shoulder pain and right elbow pain.  She complains of a skin tear to the right elbow.   Past Medical History  Diagnosis Date  . Hyperlipidemia   . RLS (restless legs syndrome)   . Breast cancer ([facility])     hx of left with radiation and lumpectomy  . History of colonic polyps   . GERD (gastroesophageal reflux disease)   . History of ERCP     and sphincterotomy for abnormal lfts and dilated biliary ducts 5/05  . Pulmonary nodule   . Migraine headache   . Wears contact lenses    Past Surgical History  Procedure Laterality Date  . Ercp      for abnormal lfts and dilated biliary ducts 5/05  . Sphincterotomy      for abnormal lfts and dilated biliary ducts 5/05  . Lymph node biopsy    . Cystocele repair    . Appendectomy    . Hemorrhoid surgery    . Tonsillectomy    . Cervical spine surgery  3/03  . Gallbladder duct open  2006  . Cholecystectomy  2012  . Orthoscopic rt knee    . Hysteroscopy    . Complete hysterectomy    . Breast lumpectomy  1995    lt lumpsnbx  . Abdominal hysterectomy      bso  . Breast lumpectomy  with radioactive seed localization Left 07/01/2014    Procedure: LEFT BREAST LUMPECTOMY WITH RADIOACTIVE SEED LOCALIZATION;  Surgeon: [name], MD;  Location: [facility];  Service: General;  Laterality: Left;   No family history on file. Social History  Substance Use Topics  . Smoking status: Former Smoker -- 3.00 packs/day for 25 years    Types: Cigarettes    Quit date: 02/26/1994  . Smokeless tobacco: Never Used  . Alcohol Use: No   OB History    No data available     Review of Systems All other systems negative unless otherwise stated in HPI    Allergies  Codeine  Home Medications   Prior to Admission medications   Medication Sig Start Date End Date Taking? Authorizing Provider  anastrozole (ARIMIDEX) 1 MG tablet Take 1 tablet (1 mg total) by mouth daily. 09/07/15  Yes Truitt Merle, MD  BIOTIN PO Take 2,000 mcg by mouth daily.   Yes Historical Provider, MD  BLEPHAMIDE S.O.P. ophthalmic ointment Place 1 application into the left eye 2 (two) times daily.  04/10/15  Yes Historical Provider, MD  cetirizine (ZYRTEC) 10 MG tablet Take 10 mg by mouth daily.   Yes Historical Provider, MD  Cholecalciferol (VITAMIN D3) 2000 UNITS TABS Take 1  tablet by mouth daily.   Yes Historical Provider, MD  dicyclomine (BENTYL) 20 MG tablet Take 1 tablet (20 mg total) by mouth 2 (two) times daily. 04/10/15  Yes Lafayette Dragon, MD  DUREZOL 0.05 % EMUL Place 1 drop into the left eye 2 (two) times daily.  04/08/15  Yes Historical Provider, MD  esomeprazole (NEXIUM) 40 MG capsule Take 1 capsule (40 mg total) by mouth daily. 07/04/15  Yes Burnis Medin, MD  fish oil-omega-3 fatty acids 1000 MG capsule Take 1 g by mouth daily.    Yes Historical Provider, MD  LOTEMAX 0.5 % GEL instill 1 drop into left eye three times a day 02/17/15  Yes Historical Provider, MD  mirtazapine (REMERON) 15 MG tablet Take 1 tablet (15 mg total) by mouth at bedtime. Can increase to 2 hs for sleep 02/02/15  Yes Burnis Medin, MD  Multiple Vitamin (MULTIVITAMIN) tablet Take 1 tablet by mouth daily.   Yes Historical Provider, MD  Probiotic Product ([employer]S PROBIOTIC) CHEW Chew 2 capsules by mouth daily.   Yes Historical Provider, MD  rosuvastatin (CRESTOR) 5 MG tablet Take 1 tablet (5 mg total) by mouth daily. 07/04/15  Yes Burnis Medin, MD  sertraline (ZOLOFT) 100 MG tablet Take 1 tablet (100 mg total) by mouth daily. 09/07/15  Yes Truitt Merle, MD  TOBRADEX ophthalmic ointment Place 1 application into the left eye 2 (two) times daily.  09/04/15  Yes Historical Provider, MD  zolpidem (AMBIEN) 10 MG tablet 5 mg hs po  if needed for sleep .Avoid daily use. 06/09/15  Yes Burnis Medin, MD  amoxicillin-clavulanate (AUGMENTIN) 875-125 MG tablet Take 1 tablet by mouth every 12 (twelve) hours. Patient not taking: Reported on 09/13/2015 07/21/15   Burnis Medin, MD  diazepam (VALIUM) 5 MG tablet Take 1-2 tablets before procedure 10/18/14   Burnis Medin, MD  ranitidine (ZANTAC) 150 MG tablet take 1 tablet by mouth at bedtime MAY INCREASE TO 2 TABLETS AT BEDTIME 06/07/15   Burnis Medin, MD   BP 144/96 mmHg  Pulse 65  Temp(Src) 97.8 F (36.6 C) (Oral)  Resp 15  SpO2 94% Physical Exam  Constitutional: She is oriented to person, place, and time. She appears well-developed and well-nourished.  HENT:  Head: Normocephalic and atraumatic. Head is without raccoon's eyes, without Battle's sign, without abrasion, without contusion and without laceration.  Right Ear: External ear normal.  Left Ear: External ear normal.  Nose: Nose normal.  Mouth/Throat: Uvula is midline, oropharynx is clear and moist and mucous membranes are normal.  Eyes: Conjunctivae are normal. Pupils are equal, round, and reactive to light.  Neck: Normal range of motion. Neck supple.  No cervical midline tenderness.   Cardiovascular: Normal rate, regular rhythm, normal heart sounds and intact distal pulses.   No murmur heard. Pulses:      Radial pulses  are 2+ on the right side, and 2+ on the left side.  Pulmonary/Chest: Effort normal and breath sounds normal. No accessory muscle usage or stridor. No respiratory distress. She has no wheezes. She has no rhonchi. She has no rales.  Abdominal: Soft. Bowel sounds are normal. She exhibits no distension. There is no tenderness.  Musculoskeletal: Normal range of motion. She exhibits tenderness.       Right shoulder: She exhibits tenderness and pain. She exhibits normal range of motion, no bony tenderness, no swelling, no effusion, no crepitus, no deformity, no laceration, no spasm, normal pulse and normal strength.  Right elbow: She exhibits normal range of motion, no swelling, no effusion, no deformity and no laceration. Tenderness found.       Right wrist: Normal.  No thoracic or lumbar tenderness. Mildly TTP along right shoulder joint and right elbow.  No anatomical snuffbox tenderness. Compartments are soft and compressible.   Lymphadenopathy:    She has no cervical adenopathy.  Neurological: She is alert and oriented to person, place, and time.  Speech clear without dysarthria.  Strength and sensation intact bilaterally throughout upper and lower extremities.  Skin: Skin is warm and dry. Abrasion noted. No bruising, no ecchymosis and no laceration noted.     Abrasion to right shoulder, superficial skin tear to right elbow.  Multiple small abrasions to wrist.  No active bleeding.   Psychiatric: She has a normal mood and affect. Her behavior is normal.    ED Course  Procedures (including critical care time) Labs Review Labs Reviewed - No data to display  Imaging Review Dg Shoulder Right  09/13/2015  CLINICAL DATA:  Status post fall on concrete, with injury to right shoulder. Right shoulder abrasion. Initial encounter. EXAM: RIGHT SHOULDER - 2+ VIEW COMPARISON:  Chest radiograph performed 07/17/2013 FINDINGS: There is no evidence of fracture or dislocation. The right humeral head is  seated within the glenoid fossa. The acromioclavicular joint is unremarkable in appearance. No significant soft tissue abnormalities are seen. The visualized portions of the right lung are clear. IMPRESSION: No evidence of fracture or dislocation. Electronically Signed   By: Garald Balding M.D.   On: 09/13/2015 23:25   Dg Elbow Complete Right  09/13/2015  CLINICAL DATA:  Fall with right elbow pain EXAM: RIGHT ELBOW - COMPLETE 3+ VIEW COMPARISON:  None. FINDINGS: The lateral view is slightly oblique, limiting evaluation for a right elbow joint effusion. No fracture, dislocation or suspicious focal osseous lesion. Right elbow joint space appears normal. No pathologic soft tissue calcifications. IMPRESSION: No fracture or malalignment in the right elbow, with limitations as described. Electronically Signed   By: Ilona Sorrel M.D.   On: 09/13/2015 23:26   I have personally reviewed and evaluated these images and lab results as part of my medical decision-making.   EKG Interpretation None      MDM   Final diagnoses:  Fall, initial encounter  Skin tear of elbow without complication, right, initial encounter  Abrasion    Patient presents after mechanical fall now complaining of right shoulder and elbow discomfort.  Patient is not on blood thinners.  Blood pressure 144/96, pulse 65, temperature 97.8 F (36.6 C), temperature source Oral, resp. rate 15, SpO2 94 %.  On exam, heart RRR, lungs CTAB, abdomen soft and benign.  Abrasion to right shoulder.  Superficial skin tear to right elbow.  No active bleeding.  FAROM of all joints.  Right shoulder and right elbow mildly TTP.  NVI.  Plain films negative.  Tylenol for pain.  Will clean and dress wounds. Evaluation does not show pathology requring ongoing emergent intervention or admission. Pt is hemodynamically stable and mentating appropriately. Discussed findings/results and plan with patient/guardian, who agrees with plan. All questions answered. Return  precautions discussed and outpatient follow up given. Case has been discussed with and seen by Dr. Darl Householder who agrees with the above plan for discharge.      Gloriann Loan, PA-C 09/13/15 2342  Wandra Arthurs, MD 09/14/15 1600

## 2015-09-13 NOTE — Discharge Instructions (Signed)
Skin Tear Care A skin tear is when the top layer of skin peels off. To repair the skin, your doctor may use:   Tape.  Skin adhesive strips. HOME CARE  Change bandages (dressings) once a day or as told by your doctor.  Gently clean the area with salt (saline) solution or with a mild soap and water.  Do not rub the injured skin dry. Let the area air dry.  Put petroleum jelly or antibiotic cream on the tear. Do not allow a scab to form.  If the bandage sticks, moisten it with warm soapy water and remove it.  Protect the injured skin until it has healed.  Only take medicine as told by your doctor.  Take showers or baths using warm soapy water. Apply a new bandage after the shower or bath.  Keep all doctor visits as told. GET HELP RIGHT AWAY IF:   You have redness, puffiness (swelling), or more pain in the tear.  You have ayellowish-white fluid (pus) coming from the tear.  You have chills.  You have a red streak that goes away from the tear.  You have a bad smell coming from the tear or bandage.  You have a fever or lasting symptoms for more than 2-3 days.  You have a fever and your symptoms suddenly get worse. MAKE SURE YOU:   Understand these instructions.  Will watch this condition.  Will get help right away if you are not doing well or get worse.   This information is not intended to replace advice given to you by your health care provider. Make sure you discuss any questions you have with your health care provider.   Document Released: 06/11/2008 Document Revised: 05/27/2012 Document Reviewed: 03/16/2012 Elsevier Interactive Patient Education [employer].

## 2015-09-13 NOTE — ED Notes (Signed)
Pt states she fell today onto her right side, hitting her shoulder, hip and right side of her head. Pt is not on blood thinners. Pt states she hears cracking in her shoulder when moving. Pt has skin tear to right elbow where it hit concrete. Pt denies loss of consciousness.

## 2015-10-11 DIAGNOSIS — L814 Other melanin hyperpigmentation: Secondary | ICD-10-CM | POA: Diagnosis not present

## 2015-10-11 DIAGNOSIS — D1801 Hemangioma of skin and subcutaneous tissue: Secondary | ICD-10-CM | POA: Diagnosis not present

## 2015-10-11 DIAGNOSIS — D225 Melanocytic nevi of trunk: Secondary | ICD-10-CM | POA: Diagnosis not present

## 2015-10-11 DIAGNOSIS — L738 Other specified follicular disorders: Secondary | ICD-10-CM | POA: Diagnosis not present

## 2015-10-11 DIAGNOSIS — Z85828 Personal history of other malignant neoplasm of skin: Secondary | ICD-10-CM | POA: Diagnosis not present

## 2015-10-11 DIAGNOSIS — L821 Other seborrheic keratosis: Secondary | ICD-10-CM | POA: Diagnosis not present

## 2015-10-25 ENCOUNTER — Ambulatory Visit: Payer: Medicare Other | Admitting: Internal Medicine

## 2015-11-07 ENCOUNTER — Telehealth: Payer: Self-pay | Admitting: Internal Medicine

## 2015-11-07 NOTE — Telephone Encounter (Signed)
Dr. Fuller Plan are you willing to accept?

## 2015-11-07 NOTE — Telephone Encounter (Signed)
OK 

## 2015-11-10 ENCOUNTER — Other Ambulatory Visit: Payer: Self-pay | Admitting: Family Medicine

## 2015-11-10 MED ORDER — RANITIDINE HCL 150 MG PO TABS: ORAL_TABLET | ORAL | Status: DC

## 2015-11-10 NOTE — Telephone Encounter (Signed)
Sent to the pharmacy by e-scribe.  Pt has upcoming wellness on 12/27/15

## 2015-11-13 ENCOUNTER — Other Ambulatory Visit: Payer: Self-pay | Admitting: Family Medicine

## 2015-11-13 MED ORDER — RANITIDINE HCL 150 MG PO TABS: ORAL_TABLET | ORAL | Status: DC

## 2015-11-13 NOTE — Telephone Encounter (Signed)
Left a message for the pt to c/b and schedule with Dr. Stark.  °

## 2015-11-13 NOTE — Telephone Encounter (Signed)
Sent to the pharmacy by e-scribe. 

## 2015-12-11 ENCOUNTER — Other Ambulatory Visit: Payer: Self-pay | Admitting: *Deleted

## 2015-12-11 NOTE — Telephone Encounter (Signed)
Patient called requesting a refill on Ambien Last refill 06/09/15 #45 Last office visit 07/21/15 Upcoming appointment scheduled 12/27/15

## 2015-12-12 NOTE — Telephone Encounter (Signed)
Ok to refill x 1  

## 2015-12-13 MED ORDER — ZOLPIDEM TARTRATE 10 MG PO TABS: ORAL_TABLET | ORAL | Status: DC

## 2015-12-13 NOTE — Telephone Encounter (Signed)
Called to the pharmacy and left on machine. 

## 2015-12-25 ENCOUNTER — Ambulatory Visit: Payer: Medicare Other | Admitting: Gastroenterology

## 2015-12-26 NOTE — Progress Notes (Signed)
Pre visit review using our clinic review tool, if applicable. No additional management support is needed unless otherwise documented below in the visit note.  Chief Complaint  Patient presents with  . Medicare Wellness    HPI: Jeanne Tran 82 y.o. comes in today for Preventive Medicare wellness visit . And med Chronic disease management   management   Sleep see below problematic without meds denies se   Lipids no se of meds continuing  Breast cancer   Every 6 months visits   Arthritis   Saw dr Trudie Reed  And noted synovitis hands   Felt had  Seroneg RA and oa and consider plaquenil never went back and dr Trudie Reed left    Mostly right hand no other flares then didn't really understand what she should be doing  Had hand pt . ( right handed)  Takes ranitidine  And alternating with nexium for her heart burn reflux   nexium about 3 days  Per week  Sertraline works well.   Back on .  Health Maintenance  Topic Date Due  . ZOSTAVAX  12/26/2016 (Originally 10/05/1995)  . INFLUENZA VACCINE  04/16/2016  . TETANUS/TDAP  12/15/2023  . DEXA SCAN  Completed  . PNA vac Low Risk Adult  Completed   Health Maintenance Review LIFESTYLE:  TADn Sugar beverages:n Sleep:take remeron 15  ( 32 no hlep)   5 ambien ocass needs to add 1/4 or 2.5 mg to help sleep  7-8 hours  Denies hang over  Has tried off med hard to sleep  Driving  Not at night cause of bright light reflections.    MEDICARE DOCUMENT QUESTIONS  TO SCAN   Hearing: ok  Vision:  No limitations at present . Last eye check UTD had cataract surgery  Safety:  Has smoke detector and wears seat belts.  No firearms. No excess sun exposure. Sees dentist regularly.  Falls: n  Advance directive :  Reviewed  Has one.  Memory: Felt to be good  , no concern from her or her family.  Depression: No anhedonia unusual crying or depressive symptoms  Nutrition: Eats well balanced diet; adequate calcium and vitamin D. No swallowing chewing  problems.  Injury: no major injuries in the last six months.  Other healthcare providers:  Reviewed today .  Social:  Lives with alone pet cat    Preventive parameters: up-to-date  Reviewed   ADLS:   There are no problems or need for assistance  driving, feeding, obtaining food, dressing, toileting and bathing, managing money using phone. She is independent. Right hand midd fingers still contraction had pt    ROS: see abover GEN/ HEENT: No fever, significant weight changes sweats headaches vision problems hearing changes, CV/ PULM; No chest pain shortness of breath cough, syncope,edema  change in exercise tolerance. GI /GU: No adominal pain, vomiting, change in bowel habits. No blood in the stool. No significant GU symptoms. SKIN/HEME: ,no acute skin rashes suspicious lesions or bleeding. No lymphadenopathy, nodules, masses.  NEURO/ PSYCH:  No neurologic signs such as weakness numbness. No depression anxiety. IMM/ Allergy: No unusual infections.  Allergy .   REST of 12 system review negative except as per HPI   Past Medical History  Diagnosis Date  . Hyperlipidemia   . RLS (restless legs syndrome)   . Breast cancer (HCC)     hx of left with radiation and lumpectomy  . History of colonic polyps   . GERD (gastroesophageal reflux disease)   . History of  ERCP     and sphincterotomy for abnormal lfts and dilated biliary ducts 5/05  . Pulmonary nodule   . Migraine headache   . Wears contact lenses   . Diverticulosis     Family History  Problem Relation Age of Onset  . Mitral valve prolapse Son     with afib to have surgery    Social History   Social History  . Marital Status: Widowed    Spouse Name: N/A  . Number of Children: N/A  . Years of Education: N/A   Social History Main Topics  . Smoking status: Former Smoker -- 3.00 packs/day for 25 years    Types: Cigarettes    Quit date: 02/26/1994  . Smokeless tobacco: Never Used  . Alcohol Use: No  . Drug Use: No  .  Sexual Activity: Not on file   Other Topics Concern  . 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                    Outpatient Encounter Prescriptions as of 12/27/2015  Medication Sig  . anastrozole (ARIMIDEX) 1 MG tablet Take 1 tablet (1 mg total) by mouth daily.  Marland Kitchen BIOTIN PO Take 2,000 mcg by mouth daily.  . cetirizine (ZYRTEC) 10 MG tablet Take 10 mg by mouth daily.  . Cholecalciferol (VITAMIN D3) 2000 UNITS TABS Take 1 tablet by mouth daily.  Marland Kitchen dicyclomine (BENTYL) 20 MG tablet Take 1 tablet (20 mg total) by mouth 2 (two) times daily.  Marland Kitchen esomeprazole (NEXIUM) 40 MG capsule Take 1 capsule (40 mg total) by mouth daily.  . fish oil-omega-3 fatty acids 1000 MG capsule Take 1 g by mouth daily.   . mirtazapine (REMERON) 15 MG tablet Take 1 tablet (15 mg total) by mouth at bedtime. Can increase to 2 hs for sleep  . Multiple Vitamin (MULTIVITAMIN) tablet Take 1 tablet by mouth daily.  . Probiotic Product ([employer] PROBIOTIC) CHEW Chew 2 capsules by mouth daily.  . ranitidine (ZANTAC) 150 MG tablet take 1 tablet by mouth at bedtime MAY INCREASE TO 2 TABLETS AT BEDTIME  . rosuvastatin (CRESTOR) 5 MG tablet Take 1 tablet (5 mg total) by mouth daily.  . sertraline (ZOLOFT) 100 MG tablet Take 1 tablet (100 mg total) by mouth daily.  Marland Kitchen zolpidem (AMBIEN) 10 MG tablet 5 mg hs po  if needed for sleep .Avoid daily use.can take extra 1/2 pill on occasion as needed  . [DISCONTINUED] diazepam (VALIUM) 5 MG tablet Take 1-2 tablets before procedure  . [DISCONTINUED] zolpidem (AMBIEN) 10 MG tablet 5 mg hs po  if needed for sleep .Avoid daily use.  . [DISCONTINUED] amoxicillin-clavulanate (AUGMENTIN) 875-125 MG tablet Take 1 tablet by mouth every 12 (twelve) hours. (Patient not taking: Reported on 09/13/2015)  . [DISCONTINUED] BLEPHAMIDE S.O.P. ophthalmic ointment Place 1 application into the left eye 2 (two) times daily.   .  [DISCONTINUED] DUREZOL 0.05 % EMUL Place 1 drop into the left eye 2 (two) times daily.   . [DISCONTINUED] LOTEMAX 0.5 % GEL instill 1 drop into left eye three times a day  . [DISCONTINUED] TOBRADEX ophthalmic ointment Place 1 application into the left eye 2 (two) times daily.    No facility-administered encounter medications on file as of 12/27/2015.    EXAM:  BP 126/70 mmHg  Temp(Src) 98.7  F (37.1 C) (Oral)  Ht 5' 3.75" (1.619 m)  Wt 158 lb 4.8 oz (71.804 kg)  BMI 27.39 kg/m2  Body mass index is 27.39 kg/(m^2).  Physical Exam: Vital signs reviewed XLK:GMWN is a well-developed well-nourished alert cooperative   who appears stated age in no acute distress.  HEENT: normocephalic atraumatic , Eyes: PERRL EOM's full, conjunctiva clear, Nares: paten,t no deformity discharge or tenderness., Ears: no deformity EAC's clear TMs with normal landmarks. Mouth: clear OP, no lesions, edema.  Moist mucous membranes. Dentition in adequate repair. NECK: supple without masses, thyromegaly or bruits. CHEST/PULM:  Clear to auscultation and percussion breath sounds equal no wheeze , rales or rhonchi. .  Left breast  Thickened changes no lumps  No axillary lumps  CV: PMI is nondisplaced, S1 S2 no gallops, murmurs, rubs. Peripheral pulses are full without delayABDOMEN: Bowel sounds normal nontender  No guard or rebound, no hepato splenomegal no CVA tenderness.   Extremtities:  No clubbing cyanosis or edema,  Right hand midd 2 finger contractures and mild swelling 2 and 3 mcp joint  noulncer deviation no acute joint swelling or redness no focal atrophyotherwise  NEURO:  Oriented x3, cranial nerves 3-12 appear to be intact, no obvious focal weakness,gait within normal limits no abnormal reflexes or asymmetrical SKIN: No acute rashes normal turgor, color, no bruising or petechiae. PSYCH: Oriented, good eye contact, no obvious depression anxiety, cognition and judgment appear normal. LN: no cervical axillary  inguinal adenopathy No noted deficits in memory, attention, and speech.   Lab Results  Component Value Date   WBC 6.1 09/07/2015   HGB 13.0 09/07/2015   HCT 40.3 09/07/2015   PLT 190 09/07/2015   GLUCOSE 73 09/07/2015   CHOL 176 10/18/2014   TRIG 164.0* 10/18/2014   HDL 73.40 10/18/2014   LDLCALC 70 10/18/2014   ALT 18 09/07/2015   AST 25 09/07/2015   NA 142 09/07/2015   K 4.7 09/07/2015   CL 107 06/20/2015   CREATININE 1.1 09/07/2015   BUN 22.8 09/07/2015   CO2 29 09/07/2015   TSH 3.37 10/18/2014   INR 1.00 06/29/2014   HGBA1C 6.3 12/14/2013    ASSESSMENT AND PLAN:  Discussed the following assessment and plan:  Medicare annual wellness visit, subsequent  Medication management - Plan: Lipid panel, TSH, Hepatic function panel, Basic metabolic panel, CBC with Differential/Platelet, Sedimentation rate  Multiple joint pain w deformity - Plan: Lipid panel, TSH, Hepatic function panel, Basic metabolic panel, CBC with Differential/Platelet, Sedimentation rate  Seronegative arthritis - Plan: Lipid panel, TSH, Hepatic function panel, Basic metabolic panel, CBC with Differential/Platelet, Sedimentation rate  BREAST CANCER, HX OF  Hx of hiatal hernia - Plan: Lipid panel, TSH, Hepatic function panel, Basic metabolic panel, CBC with Differential/Platelet, Sedimentation rate  High risk medication use - Plan: Lipid panel, TSH, Hepatic function panel, Basic metabolic panel, CBC with Differential/Platelet, Sedimentation rate  HLD (hyperlipidemia) - Plan: Lipid panel, TSH, Hepatic function panel, Basic metabolic panel, CBC with Differential/Platelet, Sedimentation rate  Insomnia - Plan: Lipid panel, TSH, Hepatic function panel, Basic metabolic panel, CBC with Differential/Platelet, Sedimentation rate Get back with rheum for reasons discussed with patient and reviewed   Try to prevent more deformity right hand  reveiwed all meds     Risk benefit of medication discussed. limit ppi if  possible as she is doing. ambien problematic but using 5 mg and remeron  15 ocass extra 2.5  Poss tolerance  Pt aware risk benefit  She had been off  med until got breast cancer  Doing well now. Patient Care Team: Burnis Medin, MD as PCP - General Dyke Maes, OD (Optometry) Kary Kos, MD (Neurosurgery) [name], MD as Consulting Physician (General Surgery) Truitt Merle, MD as Consulting Physician (Hematology) Daryll Brod, MD as Consulting Physician (Orthopedic Surgery) Ladene Artist, MD as Consulting Physician (Gastroenterology) Warden Fillers, MD as Consulting Physician (Ophthalmology) Harriett Sine, MD as Consulting Physician (Dermatology)  Patient Instructions  Continue lifestyle intervention healthy eating and exercise . Continue the remeron  at night    With 5 ambien   ocass use of  Extra 1/2 dose . With caution. Get a Fu visit with rheumatology for reasons discussed  Poss  Dx of rheumatoid arthritis   As well as you osteoarthritis   Check into shingles vaccine ( Zostavax) reimbursement or cost to you  and can return at any time if call ahead for injection. Will notify you  of labs when available. ROV in 6 months med check  Or as needed depending on labs.      Health Maintenance, Female Adopting a healthy lifestyle and getting preventive care can go a long way to promote health and wellness. Talk with your health care provider about what schedule of regular examinations is right for you. This is a good chance for you to check in with your provider about disease prevention and staying healthy. In between checkups, there are plenty of things you can do on your own. Experts have done a lot of research about which lifestyle changes and preventive measures are most likely to keep you healthy. Ask your health care provider for more information. WEIGHT AND DIET  Eat a healthy diet  Be sure to include plenty of vegetables, fruits, low-fat dairy products, and lean  protein.  Do not eat a lot of foods high in solid fats, added sugars, or salt.  Get regular exercise. This is one of the most important things you can do for your health.  Most adults should exercise for at least 150 minutes each week. The exercise should increase your heart rate and make you sweat (moderate-intensity exercise).  Most adults should also do strengthening exercises at least twice a week. This is in addition to the moderate-intensity exercise.  Maintain a healthy weight  Body mass index (BMI) is a measurement that can be used to identify possible weight problems. It estimates body fat based on height and weight. Your health care provider can help determine your BMI and help you achieve or maintain a healthy weight.  For females 61 years of age and older:   A BMI below 18.5 is considered underweight.  A BMI of 18.5 to 24.9 is normal.  A BMI of 25 to 29.9 is considered overweight.  A BMI of 30 and above is considered obese.  Watch levels of cholesterol and blood lipids  You should start having your blood tested for lipids and cholesterol at 80 years of age, then have this test every 5 years.  You may need to have your cholesterol levels checked more often if:  Your lipid or cholesterol levels are high.  You are older than 80 years of age.  You are at high risk for heart disease.  CANCER SCREENING   Lung Cancer  Lung cancer screening is recommended for adults 110-70 years old who are at high risk for lung cancer because of a history of smoking.  A yearly low-dose CT scan of the lungs is recommended for people who:  Currently smoke.  Have quit within the past 15 years.  Have at least a 30-pack-year history of smoking. A pack year is smoking an average of one pack of cigarettes a day for 1 year.  Yearly screening should continue until it has been 15 years since you quit.  Yearly screening should stop if you develop a health problem that would prevent you  from having lung cancer treatment.  Breast Cancer  Practice breast self-awareness. This means understanding how your breasts normally appear and feel.  It also means doing regular breast self-exams. Let your health care provider know about any changes, no matter how small.  If you are in your 20s or 30s, you should have a clinical breast exam (CBE) by a health care provider every 1-3 years as part of a regular health exam.  If you are 60 or older, have a CBE every year. Also consider having a breast X-ray (mammogram) every year.  If you have a family history of breast cancer, talk to your health care provider about genetic screening.  If you are at high risk for breast cancer, talk to your health care provider about having an MRI and a mammogram every year.  Breast cancer gene (BRCA) assessment is recommended for women who have family members with BRCA-related cancers. BRCA-related cancers include:  Breast.  Ovarian.  Tubal.  Peritoneal cancers.  Results of the assessment will determine the need for genetic counseling and BRCA1 and BRCA2 testing. Cervical Cancer Your health care provider may recommend that you be screened regularly for cancer of the pelvic organs (ovaries, uterus, and vagina). This screening involves a pelvic examination, including checking for microscopic changes to the surface of your cervix (Pap test). You may be encouraged to have this screening done every 3 years, beginning at age 31.  For women ages 20-65, health care providers may recommend pelvic exams and Pap testing every 3 years, or they may recommend the Pap and pelvic exam, combined with testing for human papilloma virus (HPV), every 5 years. Some types of HPV increase your risk of cervical cancer. Testing for HPV may also be done on women of any age with unclear Pap test results.  Other health care providers may not recommend any screening for nonpregnant women who are considered low risk for pelvic  cancer and who do not have symptoms. Ask your health care provider if a screening pelvic exam is right for you.  If you have had past treatment for cervical cancer or a condition that could lead to cancer, you need Pap tests and screening for cancer for at least 20 years after your treatment. If Pap tests have been discontinued, your risk factors (such as having a new sexual partner) need to be reassessed to determine if screening should resume. Some women have medical problems that increase the chance of getting cervical cancer. In these cases, your health care provider may recommend more frequent screening and Pap tests. Colorectal Cancer  This type of cancer can be detected and often prevented.  Routine colorectal cancer screening usually begins at 80 years of age and continues through 79 years of age.  Your health care provider may recommend screening at an earlier age if you have risk factors for colon cancer.  Your health care provider may also recommend using home test kits to check for hidden blood in the stool.  A small camera at the end of a tube can be used to examine your colon directly (sigmoidoscopy or colonoscopy). This is  done to check for the earliest forms of colorectal cancer.  Routine screening usually begins at age 49.  Direct examination of the colon should be repeated every 5-10 years through 80 years of age. However, you may need to be screened more often if early forms of precancerous polyps or small growths are found. Skin Cancer  Check your skin from head to toe regularly.  Tell your health care provider about any new moles or changes in moles, especially if there is a change in a mole's shape or color.  Also tell your health care provider if you have a mole that is larger than the size of a pencil eraser.  Always use sunscreen. Apply sunscreen liberally and repeatedly throughout the day.  Protect yourself by wearing long sleeves, pants, a wide-brimmed hat, and  sunglasses whenever you are outside. HEART DISEASE, DIABETES, AND HIGH BLOOD PRESSURE   High blood pressure causes heart disease and increases the risk of stroke. High blood pressure is more likely to develop in:  People who have blood pressure in the high end of the normal range (130-139/85-89 mm Hg).  People who are overweight or obese.  People who are African American.  If you are 27-45 years of age, have your blood pressure checked every 3-5 years. If you are 58 years of age or older, have your blood pressure checked every year. You should have your blood pressure measured twice--once when you are at a hospital or clinic, and once when you are not at a hospital or clinic. Record the average of the two measurements. To check your blood pressure when you are not at a hospital or clinic, you can use:  An automated blood pressure machine at a pharmacy.  A home blood pressure monitor.  If you are between 15 years and 3 years old, ask your health care provider if you should take aspirin to prevent strokes.  Have regular diabetes screenings. This involves taking a blood sample to check your fasting blood sugar level.  If you are at a normal weight and have a low risk for diabetes, have this test once every three years after 80 years of age.  If you are overweight and have a high risk for diabetes, consider being tested at a younger age or more often. PREVENTING INFECTION  Hepatitis B  If you have a higher risk for hepatitis B, you should be screened for this virus. You are considered at high risk for hepatitis B if:  You were born in a country where hepatitis B is common. Ask your health care provider which countries are considered high risk.  Your parents were born in a high-risk country, and you have not been immunized against hepatitis B (hepatitis B vaccine).  You have HIV or AIDS.  You use needles to inject street drugs.  You live with someone who has hepatitis B.  You have  had sex with someone who has hepatitis B.  You get hemodialysis treatment.  You take certain medicines for conditions, including cancer, organ transplantation, and autoimmune conditions. Hepatitis C  Blood testing is recommended for:  Everyone born from [birth year] through 1963.  Anyone with known risk factors for hepatitis C. Sexually transmitted infections (STIs)  You should be screened for sexually transmitted infections (STIs) including gonorrhea and chlamydia if:  You are sexually active and are younger than 80 years of age.  You are older than 80 years of age and your health care provider tells you that you are at risk  for this type of infection.  Your sexual activity has changed since you were last screened and you are at an increased risk for chlamydia or gonorrhea. Ask your health care provider if you are at risk.  If you do not have HIV, but are at risk, it may be recommended that you take a prescription medicine daily to prevent HIV infection. This is called pre-exposure prophylaxis (PrEP). You are considered at risk if:  You are sexually active and do not regularly use condoms or know the HIV status of your partner(s).  You take drugs by injection.  You are sexually active with a partner who has HIV. Talk with your health care provider about whether you are at high risk of being infected with HIV. If you choose to begin PrEP, you should first be tested for HIV. You should then be tested every 3 months for as long as you are taking PrEP.  PREGNANCY   If you are premenopausal and you may become pregnant, ask your health care provider about preconception counseling.  If you may become pregnant, take 400 to 800 micrograms (mcg) of folic acid every day.  If you want to prevent pregnancy, talk to your health care provider about birth control (contraception). OSTEOPOROSIS AND MENOPAUSE   Osteoporosis is a disease in which the bones lose minerals and strength with aging. This can  result in serious bone fractures. Your risk for osteoporosis can be identified using a bone density scan.  If you are 44 years of age or older, or if you are at risk for osteoporosis and fractures, ask your health care provider if you should be screened.  Ask your health care provider whether you should take a calcium or vitamin D supplement to lower your risk for osteoporosis.  Menopause may have certain physical symptoms and risks.  Hormone replacement therapy may reduce some of these symptoms and risks. Talk to your health care provider about whether hormone replacement therapy is right for you.  HOME CARE INSTRUCTIONS   Schedule regular health, dental, and eye exams.  Stay current with your immunizations.   Do not use any tobacco products including cigarettes, chewing tobacco, or electronic cigarettes.  If you are pregnant, do not drink alcohol.  If you are breastfeeding, limit how much and how often you drink alcohol.  Limit alcohol intake to no more than 1 drink per day for nonpregnant women. One drink equals 12 ounces of beer, 5 ounces of wine, or 1 ounces of hard liquor.  Do not use street drugs.  Do not share needles.  Ask your health care provider for help if you need support or information about quitting drugs.  Tell your health care provider if you often feel depressed.  Tell your health care provider if you have ever been abused or do not feel safe at home.   This information is not intended to replace advice given to you by your health care provider. Make sure you discuss any questions you have with your health care provider.   Document Released: 03/18/2011 Document Revised: 09/23/2014 Document Reviewed: 08/04/2013 Elsevier Interactive Patient Education 2016 [place]. Akiyah Eppolito M.D.

## 2015-12-27 ENCOUNTER — Ambulatory Visit (INDEPENDENT_AMBULATORY_CARE_PROVIDER_SITE_OTHER): Payer: Medicare Other | Admitting: Internal Medicine

## 2015-12-27 ENCOUNTER — Encounter: Payer: Self-pay | Admitting: Internal Medicine

## 2015-12-27 VITALS — BP 126/70 | Temp 98.7°F | Ht 63.75 in | Wt 158.3 lb

## 2015-12-27 DIAGNOSIS — Z0000 Encounter for general adult medical examination without abnormal findings: Secondary | ICD-10-CM | POA: Diagnosis not present

## 2015-12-27 DIAGNOSIS — Z79899 Other long term (current) drug therapy: Secondary | ICD-10-CM | POA: Diagnosis not present

## 2015-12-27 DIAGNOSIS — Z853 Personal history of malignant neoplasm of breast: Secondary | ICD-10-CM

## 2015-12-27 DIAGNOSIS — M1380 Other specified arthritis, unspecified site: Secondary | ICD-10-CM | POA: Diagnosis not present

## 2015-12-27 DIAGNOSIS — G4700 Insomnia, unspecified: Secondary | ICD-10-CM | POA: Diagnosis not present

## 2015-12-27 DIAGNOSIS — E785 Hyperlipidemia, unspecified: Secondary | ICD-10-CM

## 2015-12-27 DIAGNOSIS — Z8719 Personal history of other diseases of the digestive system: Secondary | ICD-10-CM | POA: Diagnosis not present

## 2015-12-27 DIAGNOSIS — M2550 Pain in unspecified joint: Secondary | ICD-10-CM | POA: Diagnosis not present

## 2015-12-27 LAB — CBC WITH DIFFERENTIAL/PLATELET
BASOS PCT: 0.4 % (ref 0.0–3.0)
Basophils Absolute: 0 10*3/uL (ref 0.0–0.1)
EOS PCT: 2.9 % (ref 0.0–5.0)
Eosinophils Absolute: 0.2 10*3/uL (ref 0.0–0.7)
HCT: 39.8 % (ref 36.0–46.0)
Hemoglobin: 13.4 g/dL (ref 12.0–15.0)
LYMPHS ABS: 1.6 10*3/uL (ref 0.7–4.0)
Lymphocytes Relative: 23.4 % (ref 12.0–46.0)
MCHC: 33.6 g/dL (ref 30.0–36.0)
MCV: 92 fl (ref 78.0–100.0)
MONO ABS: 0.6 10*3/uL (ref 0.1–1.0)
Monocytes Relative: 8.6 % (ref 3.0–12.0)
NEUTROS ABS: 4.3 10*3/uL (ref 1.4–7.7)
NEUTROS PCT: 64.7 % (ref 43.0–77.0)
PLATELETS: 202 10*3/uL (ref 150.0–400.0)
RBC: 4.33 Mil/uL (ref 3.87–5.11)
RDW: 13.6 % (ref 11.5–15.5)
WBC: 6.7 10*3/uL (ref 4.0–10.5)

## 2015-12-27 LAB — SEDIMENTATION RATE: SED RATE: 15 mm/h (ref 0–22)

## 2015-12-27 LAB — BASIC METABOLIC PANEL
BUN: 23 mg/dL (ref 6–23)
CO2: 28 meq/L (ref 19–32)
Calcium: 9.8 mg/dL (ref 8.4–10.5)
Chloride: 104 mEq/L (ref 96–112)
Creatinine, Ser: 1.24 mg/dL — ABNORMAL HIGH (ref 0.40–1.20)
GFR: 44.21 mL/min — AB (ref >60.00)
GLUCOSE: 94 mg/dL (ref 70–99)
POTASSIUM: 4.3 meq/L (ref 3.5–5.1)
Sodium: 139 mEq/L (ref 135–145)

## 2015-12-27 LAB — HEPATIC FUNCTION PANEL
ALT: 17 U/L (ref 0–35)
AST: 25 U/L (ref 0–37)
Albumin: 4.3 g/dL (ref 3.5–5.2)
Alkaline Phosphatase: 103 U/L (ref 39–117)
BILIRUBIN DIRECT: 0.1 mg/dL (ref 0.0–0.3)
BILIRUBIN TOTAL: 0.5 mg/dL (ref 0.2–1.2)
Total Protein: 6.7 g/dL (ref 6.0–8.3)

## 2015-12-27 LAB — LIPID PANEL
CHOL/HDL RATIO: 3
CHOLESTEROL: 193 mg/dL (ref 0–200)
HDL: 69.6 mg/dL (ref >39.00)
LDL Cholesterol: 97 mg/dL (ref 0–99)
NonHDL: 123.41
Triglycerides: 133 mg/dL (ref 0.0–149.0)
VLDL: 26.6 mg/dL (ref 0.0–40.0)

## 2015-12-27 LAB — TSH: TSH: 3.73 u[IU]/mL (ref 0.35–4.50)

## 2015-12-27 MED ORDER — ZOLPIDEM TARTRATE 10 MG PO TABS: ORAL_TABLET | ORAL | Status: DC

## 2015-12-27 MED ORDER — ROSUVASTATIN CALCIUM 5 MG PO TABS: 5.0000 mg | ORAL_TABLET | Freq: Every day | ORAL | Status: DC

## 2015-12-27 NOTE — Patient Instructions (Addendum)
Continue lifestyle intervention healthy eating and exercise . Continue the remeron  at night    With 5 ambien   ocass use of  Extra 1/2 dose . With caution. Get a Fu visit with rheumatology for reasons discussed  Poss  Dx of rheumatoid arthritis   As well as you osteoarthritis   Check into shingles vaccine ( Zostavax) reimbursement or cost to you  and can return at any time if call ahead for injection. Will notify you  of labs when available. ROV in 6 months med check  Or as needed depending on labs.      Health Maintenance, Female Adopting a healthy lifestyle and getting preventive care can go a long way to promote health and wellness. Talk with your health care provider about what schedule of regular examinations is right for you. This is a good chance for you to check in with your provider about disease prevention and staying healthy. In between checkups, there are plenty of things you can do on your own. Experts have done a lot of research about which lifestyle changes and preventive measures are most likely to keep you healthy. Ask your health care provider for more information. WEIGHT AND DIET  Eat a healthy diet  Be sure to include plenty of vegetables, fruits, low-fat dairy products, and lean protein.  Do not eat a lot of foods high in solid fats, added sugars, or salt.  Get regular exercise. This is one of the most important things you can do for your health.  Most adults should exercise for at least 150 minutes each week. The exercise should increase your heart rate and make you sweat (moderate-intensity exercise).  Most adults should also do strengthening exercises at least twice a week. This is in addition to the moderate-intensity exercise.  Maintain a healthy weight  Body mass index (BMI) is a measurement that can be used to identify possible weight problems. It estimates body fat based on height and weight. Your health care provider can help determine your BMI and help you  achieve or maintain a healthy weight.  For females 52 years of age and older:   A BMI below 18.5 is considered underweight.  A BMI of 18.5 to 24.9 is normal.  A BMI of 25 to 29.9 is considered overweight.  A BMI of 30 and above is considered obese.  Watch levels of cholesterol and blood lipids  You should start having your blood tested for lipids and cholesterol at 80 years of age, then have this test every 5 years.  You may need to have your cholesterol levels checked more often if:  Your lipid or cholesterol levels are high.  You are older than 80 years of age.  You are at high risk for heart disease.  CANCER SCREENING   Lung Cancer  Lung cancer screening is recommended for adults 3-28 years old who are at high risk for lung cancer because of a history of smoking.  A yearly low-dose CT scan of the lungs is recommended for people who:  Currently smoke.  Have quit within the past 15 years.  Have at least a 30-pack-year history of smoking. A pack year is smoking an average of one pack of cigarettes a day for 1 year.  Yearly screening should continue until it has been 15 years since you quit.  Yearly screening should stop if you develop a health problem that would prevent you from having lung cancer treatment.  Breast Cancer  Practice breast self-awareness.  This means understanding how your breasts normally appear and feel.  It also means doing regular breast self-exams. Let your health care provider know about any changes, no matter how small.  If you are in your 20s or 30s, you should have a clinical breast exam (CBE) by a health care provider every 1-3 years as part of a regular health exam.  If you are 13 or older, have a CBE every year. Also consider having a breast X-ray (mammogram) every year.  If you have a family history of breast cancer, talk to your health care provider about genetic screening.  If you are at high risk for breast cancer, talk to your  health care provider about having an MRI and a mammogram every year.  Breast cancer gene (BRCA) assessment is recommended for women who have family members with BRCA-related cancers. BRCA-related cancers include:  Breast.  Ovarian.  Tubal.  Peritoneal cancers.  Results of the assessment will determine the need for genetic counseling and BRCA1 and BRCA2 testing. Cervical Cancer Your health care provider may recommend that you be screened regularly for cancer of the pelvic organs (ovaries, uterus, and vagina). This screening involves a pelvic examination, including checking for microscopic changes to the surface of your cervix (Pap test). You may be encouraged to have this screening done every 3 years, beginning at age 38.  For women ages 45-65, health care providers may recommend pelvic exams and Pap testing every 3 years, or they may recommend the Pap and pelvic exam, combined with testing for human papilloma virus (HPV), every 5 years. Some types of HPV increase your risk of cervical cancer. Testing for HPV may also be done on women of any age with unclear Pap test results.  Other health care providers may not recommend any screening for nonpregnant women who are considered low risk for pelvic cancer and who do not have symptoms. Ask your health care provider if a screening pelvic exam is right for you.  If you have had past treatment for cervical cancer or a condition that could lead to cancer, you need Pap tests and screening for cancer for at least 20 years after your treatment. If Pap tests have been discontinued, your risk factors (such as having a new sexual partner) need to be reassessed to determine if screening should resume. Some women have medical problems that increase the chance of getting cervical cancer. In these cases, your health care provider may recommend more frequent screening and Pap tests. Colorectal Cancer  This type of cancer can be detected and often  prevented.  Routine colorectal cancer screening usually begins at 80 years of age and continues through 80 years of age.  Your health care provider may recommend screening at an earlier age if you have risk factors for colon cancer.  Your health care provider may also recommend using home test kits to check for hidden blood in the stool.  A small camera at the end of a tube can be used to examine your colon directly (sigmoidoscopy or colonoscopy). This is done to check for the earliest forms of colorectal cancer.  Routine screening usually begins at age 63.  Direct examination of the colon should be repeated every 5-10 years through 80 years of age. However, you may need to be screened more often if early forms of precancerous polyps or small growths are found. Skin Cancer  Check your skin from head to toe regularly.  Tell your health care provider about any new moles or  changes in moles, especially if there is a change in a mole's shape or color.  Also tell your health care provider if you have a mole that is larger than the size of a pencil eraser.  Always use sunscreen. Apply sunscreen liberally and repeatedly throughout the day.  Protect yourself by wearing long sleeves, pants, a wide-brimmed hat, and sunglasses whenever you are outside. HEART DISEASE, DIABETES, AND HIGH BLOOD PRESSURE   High blood pressure causes heart disease and increases the risk of stroke. High blood pressure is more likely to develop in:  People who have blood pressure in the high end of the normal range (130-139/85-89 mm Hg).  People who are overweight or obese.  People who are African American.  If you are 45-59 years of age, have your blood pressure checked every 3-5 years. If you are 15 years of age or older, have your blood pressure checked every year. You should have your blood pressure measured twice--once when you are at a hospital or clinic, and once when you are not at a hospital or clinic.  Record the average of the two measurements. To check your blood pressure when you are not at a hospital or clinic, you can use:  An automated blood pressure machine at a pharmacy.  A home blood pressure monitor.  If you are between 74 years and 21 years old, ask your health care provider if you should take aspirin to prevent strokes.  Have regular diabetes screenings. This involves taking a blood sample to check your fasting blood sugar level.  If you are at a normal weight and have a low risk for diabetes, have this test once every three years after 80 years of age.  If you are overweight and have a high risk for diabetes, consider being tested at a younger age or more often. PREVENTING INFECTION  Hepatitis B  If you have a higher risk for hepatitis B, you should be screened for this virus. You are considered at high risk for hepatitis B if:  You were born in a country where hepatitis B is common. Ask your health care provider which countries are considered high risk.  Your parents were born in a high-risk country, and you have not been immunized against hepatitis B (hepatitis B vaccine).  You have HIV or AIDS.  You use needles to inject street drugs.  You live with someone who has hepatitis B.  You have had sex with someone who has hepatitis B.  You get hemodialysis treatment.  You take certain medicines for conditions, including cancer, organ transplantation, and autoimmune conditions. Hepatitis C  Blood testing is recommended for:  Everyone born from [birth year] through 1963.  Anyone with known risk factors for hepatitis C. Sexually transmitted infections (STIs)  You should be screened for sexually transmitted infections (STIs) including gonorrhea and chlamydia if:  You are sexually active and are younger than 80 years of age.  You are older than 80 years of age and your health care provider tells you that you are at risk for this type of infection.  Your sexual activity  has changed since you were last screened and you are at an increased risk for chlamydia or gonorrhea. Ask your health care provider if you are at risk.  If you do not have HIV, but are at risk, it may be recommended that you take a prescription medicine daily to prevent HIV infection. This is called pre-exposure prophylaxis (PrEP). You are considered at risk if:  You  are sexually active and do not regularly use condoms or know the HIV status of your partner(s).  You take drugs by injection.  You are sexually active with a partner who has HIV. Talk with your health care provider about whether you are at high risk of being infected with HIV. If you choose to begin PrEP, you should first be tested for HIV. You should then be tested every 3 months for as long as you are taking PrEP.  PREGNANCY   If you are premenopausal and you may become pregnant, ask your health care provider about preconception counseling.  If you may become pregnant, take 400 to 800 micrograms (mcg) of folic acid every day.  If you want to prevent pregnancy, talk to your health care provider about birth control (contraception). OSTEOPOROSIS AND MENOPAUSE   Osteoporosis is a disease in which the bones lose minerals and strength with aging. This can result in serious bone fractures. Your risk for osteoporosis can be identified using a bone density scan.  If you are 74 years of age or older, or if you are at risk for osteoporosis and fractures, ask your health care provider if you should be screened.  Ask your health care provider whether you should take a calcium or vitamin D supplement to lower your risk for osteoporosis.  Menopause may have certain physical symptoms and risks.  Hormone replacement therapy may reduce some of these symptoms and risks. Talk to your health care provider about whether hormone replacement therapy is right for you.  HOME CARE INSTRUCTIONS   Schedule regular health, dental, and eye  exams.  Stay current with your immunizations.   Do not use any tobacco products including cigarettes, chewing tobacco, or electronic cigarettes.  If you are pregnant, do not drink alcohol.  If you are breastfeeding, limit how much and how often you drink alcohol.  Limit alcohol intake to no more than 1 drink per day for nonpregnant women. One drink equals 12 ounces of beer, 5 ounces of wine, or 1 ounces of hard liquor.  Do not use street drugs.  Do not share needles.  Ask your health care provider for help if you need support or information about quitting drugs.  Tell your health care provider if you often feel depressed.  Tell your health care provider if you have ever been abused or do not feel safe at home.   This information is not intended to replace advice given to you by your health care provider. Make sure you discuss any questions you have with your health care provider.   Document Released: 03/18/2011 Document Revised: 09/23/2014 Document Reviewed: 08/04/2013 Elsevier Interactive Patient Education [employer].

## 2015-12-27 NOTE — Addendum Note (Signed)
Addended by: Miles Costain T on: 12/27/2015 02:15 PM   Modules accepted: Orders

## 2016-01-05 ENCOUNTER — Other Ambulatory Visit: Payer: Self-pay | Admitting: Internal Medicine

## 2016-01-05 DIAGNOSIS — R899 Unspecified abnormal finding in specimens from other organs, systems and tissues: Secondary | ICD-10-CM

## 2016-01-17 DIAGNOSIS — H2512 Age-related nuclear cataract, left eye: Secondary | ICD-10-CM | POA: Diagnosis not present

## 2016-01-17 DIAGNOSIS — H01024 Squamous blepharitis left upper eyelid: Secondary | ICD-10-CM | POA: Diagnosis not present

## 2016-01-17 DIAGNOSIS — H01025 Squamous blepharitis left lower eyelid: Secondary | ICD-10-CM | POA: Diagnosis not present

## 2016-01-17 DIAGNOSIS — H01022 Squamous blepharitis right lower eyelid: Secondary | ICD-10-CM | POA: Diagnosis not present

## 2016-01-17 DIAGNOSIS — Z961 Presence of intraocular lens: Secondary | ICD-10-CM | POA: Diagnosis not present

## 2016-01-17 DIAGNOSIS — H01021 Squamous blepharitis right upper eyelid: Secondary | ICD-10-CM | POA: Diagnosis not present

## 2016-01-17 DIAGNOSIS — H16102 Unspecified superficial keratitis, left eye: Secondary | ICD-10-CM | POA: Diagnosis not present

## 2016-01-26 ENCOUNTER — Other Ambulatory Visit: Payer: Self-pay | Admitting: Family Medicine

## 2016-01-26 ENCOUNTER — Telehealth: Payer: Self-pay | Admitting: Family Medicine

## 2016-01-26 DIAGNOSIS — R7989 Other specified abnormal findings of blood chemistry: Secondary | ICD-10-CM

## 2016-01-26 NOTE — Telephone Encounter (Signed)
   This was the   Advice   Just not to get dehydrated she doesn't have to overhydrate...not take extra liquids at night  She is due for labs next week  Please add on  Urinalysis     "Notes Recorded by Burnis Medin, MD on 12/30/2015 at 11:01 AM Blood tests Good EXCEPT  Creatinine renal function slightly worse than last time.  Avoid celebrex dont get dehydrated   repeat BMP ( renal function) in 1 months ( no ov needed)"

## 2016-01-26 NOTE — Telephone Encounter (Signed)
Spoke to the pt.  Given advise listed below.  Pt has appt for lab work on 02/05/16.  Will keep that appt.

## 2016-01-26 NOTE — Telephone Encounter (Signed)
Pt called and left a message on my machine.  She states that [facility] has advised her to increase her liquids and now she is up every 2 hours at night to go to the restroom.  Please advise.  Thanks!

## 2016-01-29 DIAGNOSIS — Z9841 Cataract extraction status, right eye: Secondary | ICD-10-CM | POA: Diagnosis not present

## 2016-01-29 DIAGNOSIS — L121 Cicatricial pemphigoid: Secondary | ICD-10-CM | POA: Diagnosis not present

## 2016-01-29 DIAGNOSIS — H02401 Unspecified ptosis of right eyelid: Secondary | ICD-10-CM | POA: Diagnosis not present

## 2016-01-29 DIAGNOSIS — Z961 Presence of intraocular lens: Secondary | ICD-10-CM | POA: Diagnosis not present

## 2016-01-29 DIAGNOSIS — Z87891 Personal history of nicotine dependence: Secondary | ICD-10-CM | POA: Diagnosis not present

## 2016-01-29 DIAGNOSIS — Z9842 Cataract extraction status, left eye: Secondary | ICD-10-CM | POA: Diagnosis not present

## 2016-01-29 DIAGNOSIS — Z885 Allergy status to narcotic agent status: Secondary | ICD-10-CM | POA: Diagnosis not present

## 2016-01-29 DIAGNOSIS — H02534 Eyelid retraction left upper eyelid: Secondary | ICD-10-CM | POA: Diagnosis not present

## 2016-02-05 ENCOUNTER — Other Ambulatory Visit (INDEPENDENT_AMBULATORY_CARE_PROVIDER_SITE_OTHER): Payer: Medicare Other

## 2016-02-05 DIAGNOSIS — R7989 Other specified abnormal findings of blood chemistry: Secondary | ICD-10-CM

## 2016-02-05 DIAGNOSIS — R748 Abnormal levels of other serum enzymes: Secondary | ICD-10-CM

## 2016-02-05 DIAGNOSIS — R899 Unspecified abnormal finding in specimens from other organs, systems and tissues: Secondary | ICD-10-CM | POA: Diagnosis not present

## 2016-02-05 LAB — POC URINALSYSI DIPSTICK (AUTOMATED)
Bilirubin, UA: NEGATIVE
Blood, UA: NEGATIVE
GLUCOSE UA: NEGATIVE
Ketones, UA: NEGATIVE
Leukocytes, UA: NEGATIVE
NITRITE UA: NEGATIVE
Protein, UA: NEGATIVE
Spec Grav, UA: 1.015
UROBILINOGEN UA: 0.2
pH, UA: 7

## 2016-02-05 LAB — BASIC METABOLIC PANEL
BUN: 22 mg/dL (ref 6–23)
CHLORIDE: 105 meq/L (ref 96–112)
CO2: 29 meq/L (ref 19–32)
Calcium: 9.5 mg/dL (ref 8.4–10.5)
Creatinine, Ser: 1.19 mg/dL (ref 0.40–1.20)
GFR: 46.35 mL/min — ABNORMAL LOW (ref >60.00)
GLUCOSE: 86 mg/dL (ref 70–99)
Potassium: 4.3 mEq/L (ref 3.5–5.1)
SODIUM: 139 meq/L (ref 135–145)

## 2016-02-06 ENCOUNTER — Other Ambulatory Visit: Payer: Self-pay | Admitting: *Deleted

## 2016-02-06 DIAGNOSIS — M0609 Rheumatoid arthritis without rheumatoid factor, multiple sites: Secondary | ICD-10-CM | POA: Diagnosis not present

## 2016-02-06 DIAGNOSIS — M545 Low back pain: Secondary | ICD-10-CM | POA: Diagnosis not present

## 2016-02-06 DIAGNOSIS — M810 Age-related osteoporosis without current pathological fracture: Secondary | ICD-10-CM | POA: Diagnosis not present

## 2016-02-06 DIAGNOSIS — M720 Palmar fascial fibromatosis [Dupuytren]: Secondary | ICD-10-CM | POA: Diagnosis not present

## 2016-02-06 DIAGNOSIS — M47814 Spondylosis without myelopathy or radiculopathy, thoracic region: Secondary | ICD-10-CM | POA: Diagnosis not present

## 2016-02-06 DIAGNOSIS — M19041 Primary osteoarthritis, right hand: Secondary | ICD-10-CM | POA: Diagnosis not present

## 2016-02-06 DIAGNOSIS — M2550 Pain in unspecified joint: Secondary | ICD-10-CM | POA: Diagnosis not present

## 2016-02-06 DIAGNOSIS — M19042 Primary osteoarthritis, left hand: Secondary | ICD-10-CM | POA: Diagnosis not present

## 2016-02-06 NOTE — Telephone Encounter (Signed)
Ok to refill x 6 months 

## 2016-02-06 NOTE — Telephone Encounter (Signed)
Received faxed request from [facility] to refill mirtazapine (REMERON) 15 mg ... Last refill 02/02/15, #180 with 1 refill... Okay to refill?

## 2016-02-07 MED ORDER — MIRTAZAPINE 15 MG PO TABS: 15.0000 mg | ORAL_TABLET | Freq: Every day | ORAL | Status: DC

## 2016-02-08 IMAGING — MR MR BREAST BILATERAL W WO CONTRAST
9 of 13 series · 32 of 48 positions shown · IV contrast (multihance)
Comparison: Prior mammograms

CLINICAL DATA: New diagnosis of left breast DCIS. Patient with a
remote history of left breast carcinoma treated in 6221. No
significant family history of breast carcinoma. Patient not taking
hormone replacement therapy.

LABS:  Performed on 05/19/2014.  BUN:  24.  Creatinine: 1.1.
EXAM:
BILATERAL BREAST MRI WITH AND WITHOUT CONTRAST
TECHNIQUE: Multiplanar, multisequence MR images of both breasts were obtained
prior to and following the intravenous administration of 14ml of
MultiHance.

[Series 2: T2 · axial · 3.0mm · 0.94mm/px · z∈[-87,+75]mm · 3 of 55 slices shown]
[im 1/55]
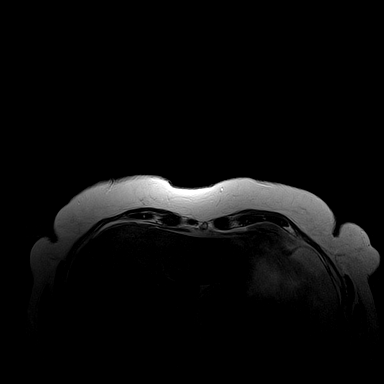
[im 28/55]
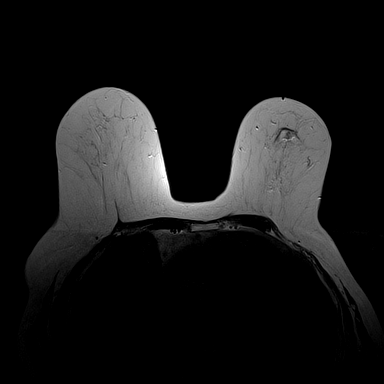
[im 55/55]
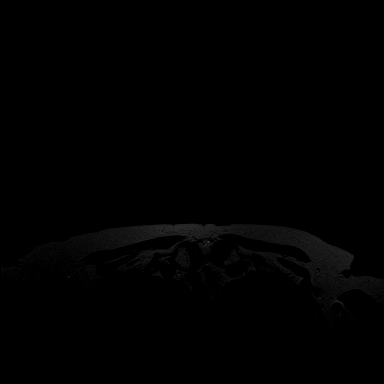

[Series 3: t2_tirm_tra ipat (a-p) · axial · 3.0mm · 0.70mm/px · z∈[-87,+75]mm · 2 of 55 slices shown]
[im 1/55]
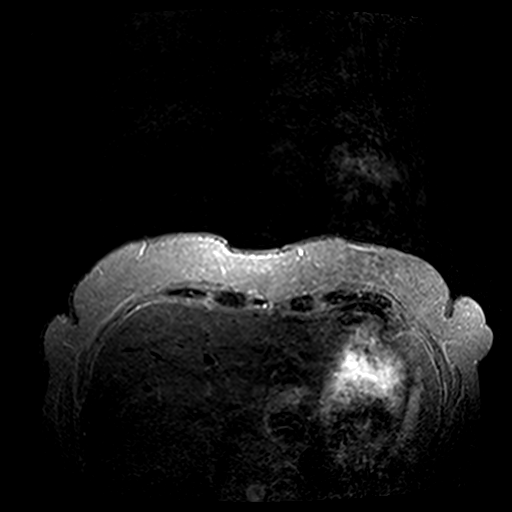
[im 55/55]
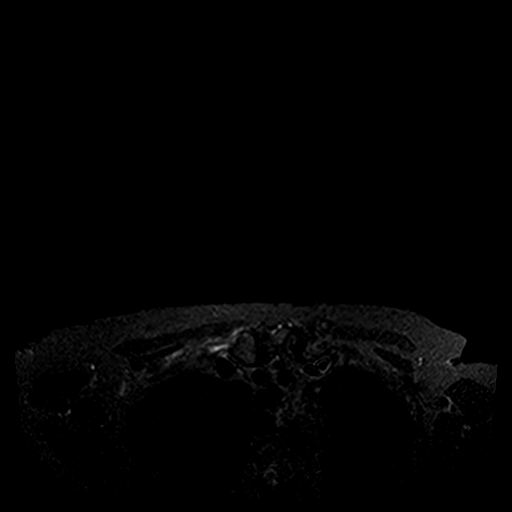

[Series 4: fl3d pre-cm no · axial · non-contrast · 1.2mm · 0.94mm/px · z∈[-92,+80]mm · 5 of 144 slices shown]
[im 1/144]
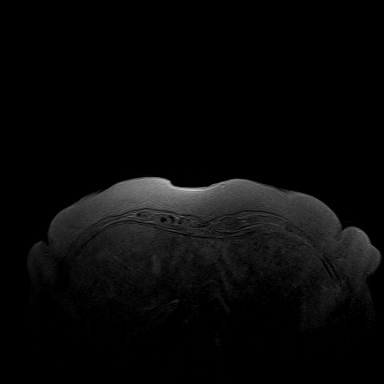
[im 36/144]
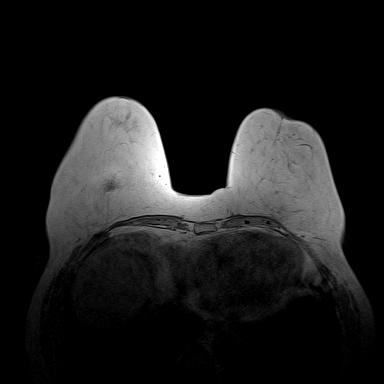
[im 72/144]
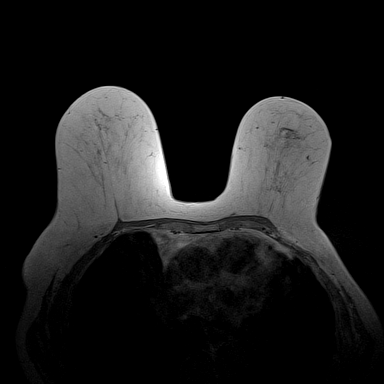
[im 108/144]
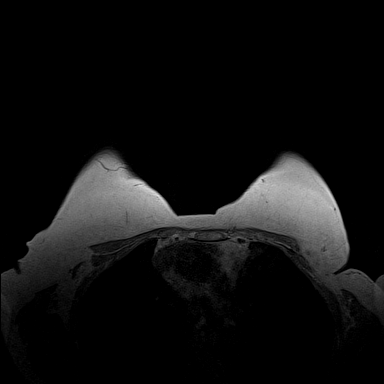
[im 144/144]
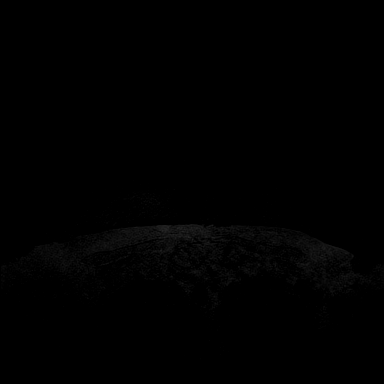

[Series 5: fl3d pre-cm · axial · non-contrast · 1.2mm · 0.94mm/px · z∈[-92,+80]mm · 5 of 144 slices shown]
[im 1/144]
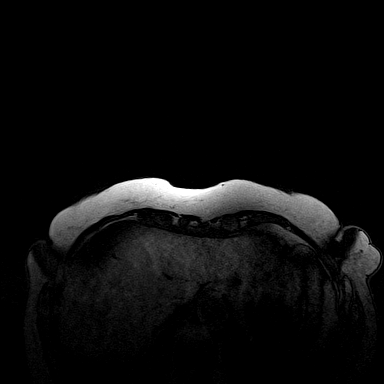
[im 36/144]
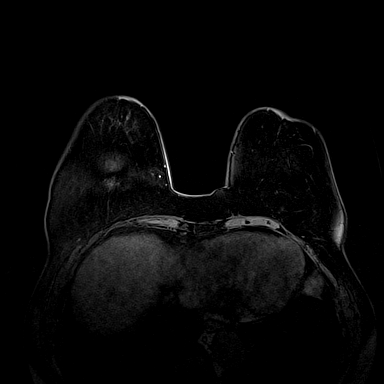
[im 72/144]
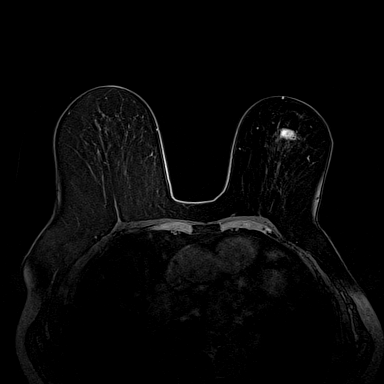
[im 108/144]
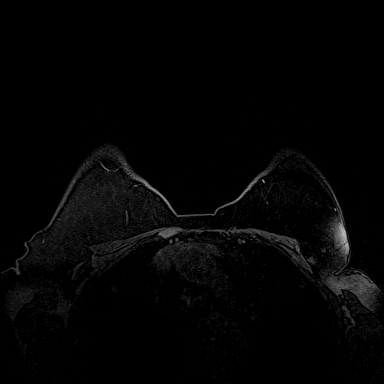
[im 144/144]
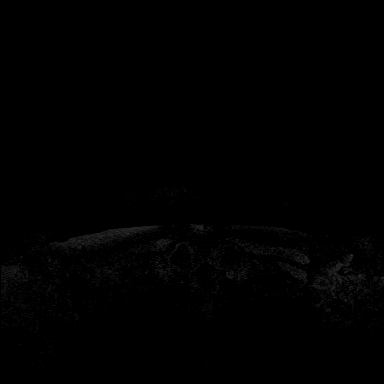

[Series 6: fl3d post-cm 20 · axial · 1.2mm · 0.94mm/px · z∈[-92,+80]mm · 5 of 144 slices shown (1 of 3)]
[im 1/144]
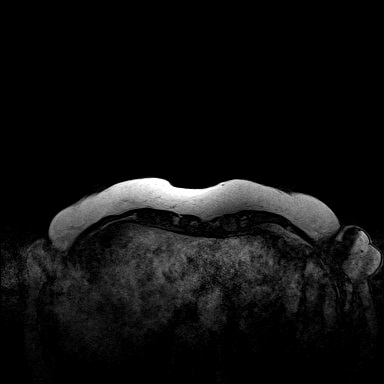
[im 36/144]
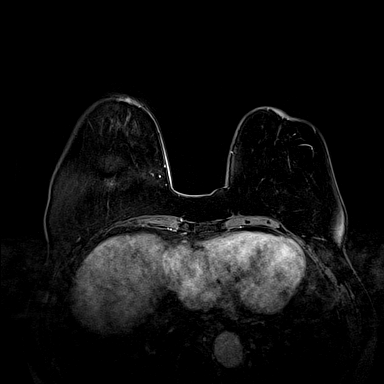
[im 72/144]
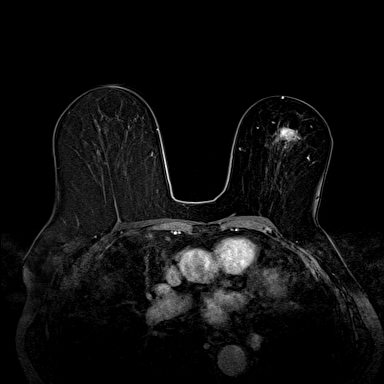
[im 108/144]
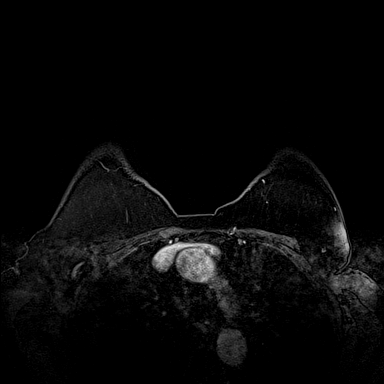
[im 144/144]
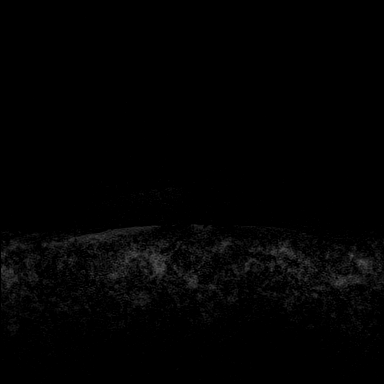

[Series 7: fl3d post-cm 20 · axial · 1.2mm · 0.94mm/px · z∈[-92,+80]mm · 5 of 144 slices shown (2 of 3)]
[im 1/144]
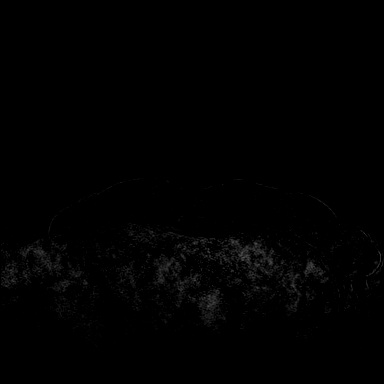
[im 36/144]
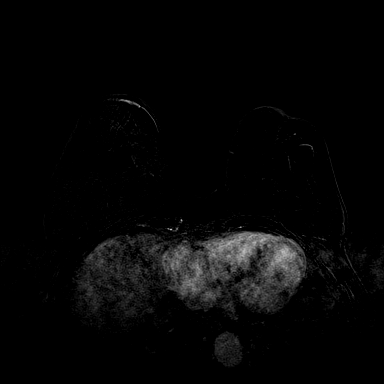
[im 72/144]
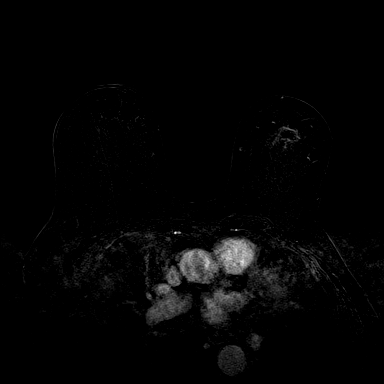
[im 108/144]
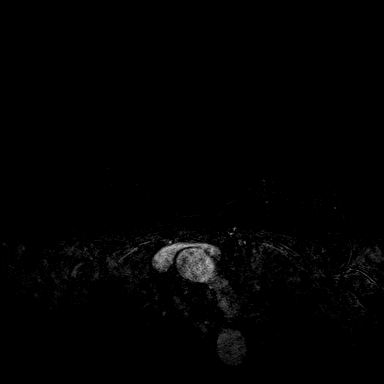
[im 144/144]
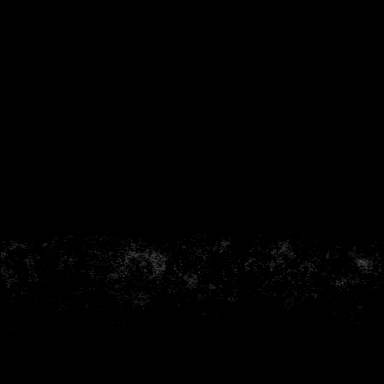

[Series 8: fl3d post-cm 20 · axial · 172.8mm · 0.94mm/px · 1 of 1 slices shown (3 of 3)]
[im 1/1]
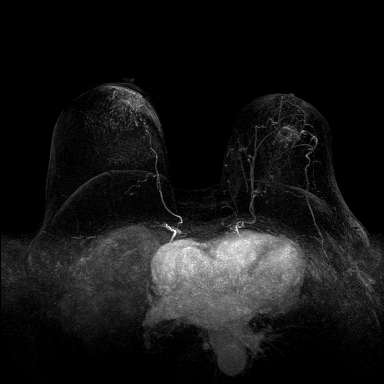

[Series 9: fl3d post-cm 3min · axial · 1.2mm · 0.94mm/px · z∈[-92,+80]mm · 5 of 144 slices shown]
[im 1/144]
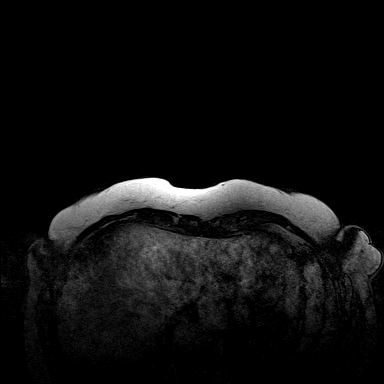
[im 36/144]
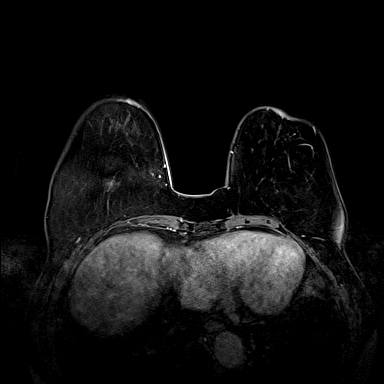
[im 72/144]
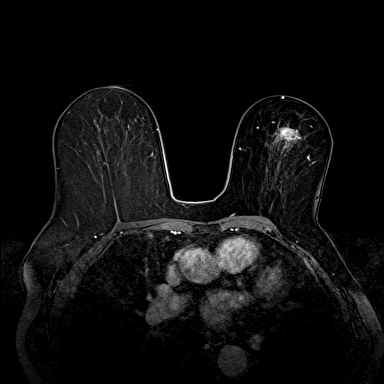
[im 108/144]
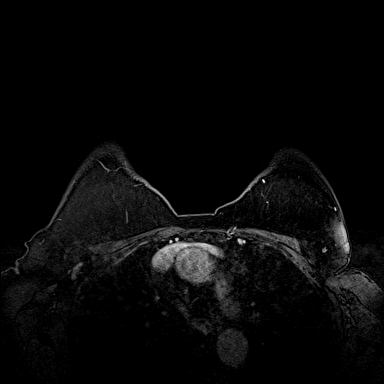
[im 144/144]
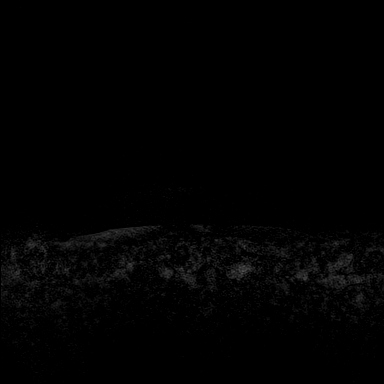

[Series 10: fl3d post-cm 3min_sub · axial · 1.2mm · 0.94mm/px · 1 of 144 slices shown]
[im 1/144]
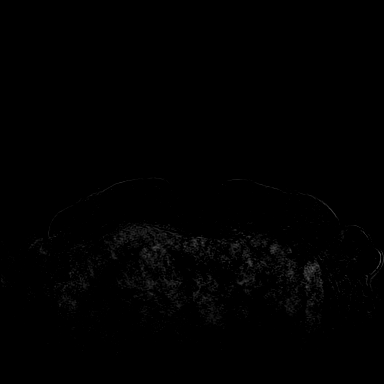

[32 of 48 positions shown; findings below may reference images not displayed]

THREE-DIMENSIONAL MR IMAGE RENDERING ON INDEPENDENT WORKSTATION:

Three-dimensional MR images were rendered by post-processing of the
original MR data on an independent workstation. The
three-dimensional MR images were interpreted, and findings are
reported in the following complete MRI report for this study. Three
dimensional images were evaluated at the independent [employer]
workstation
FINDINGS: Breast composition: b.  Scattered fibroglandular tissue.

Background parenchymal enhancement: Mild

Right breast: No mass or abnormal enhancement.

Left breast: No mass or suspicious enhancement. Biopsy clip artifact
is seen in the superior aspect of the left breast with mild
surrounding edema and hemorrhage and low level enhancement. Biopsy
clip artifact also noted in the left axilla.

Lymph nodes: No abnormal appearing lymph nodes.

Ancillary findings:  None.
IMPRESSION: No evidence of residual left breast malignancy. No abnormal
enhancement is seen to suggest malignancy in either breast.

There are the expected post biopsy changes including low level
enhancement in the upper left breast.

RECOMMENDATION:
Continue with current treatment plan for the newly diagnosed left
breast DCIS.

BI-RADS CATEGORY  6: Known biopsy-proven malignancy.

## 2016-02-13 ENCOUNTER — Telehealth: Payer: Self-pay | Admitting: Family Medicine

## 2016-02-13 DIAGNOSIS — G4700 Insomnia, unspecified: Secondary | ICD-10-CM

## 2016-02-13 NOTE — Telephone Encounter (Signed)
Pt is requesting a refill of Ambien 10 mg #90 to be sent into [facility].  Would also like a referral to Keturah Barre, MD.  She has had another sleepless night and states she cannot function without sleep.  Please advise.  Thanks!

## 2016-02-13 NOTE — Telephone Encounter (Signed)
Ok to refer   If he is not taking referrals  Refer to Dr. Beacher May who is also a sleep specialist  Can refill last rx ambien 10 mg   for #50 with sig     5 hx po prn sleep can take extra 1/2 pill if needed on ocassion

## 2016-02-14 ENCOUNTER — Other Ambulatory Visit: Payer: Self-pay | Admitting: *Deleted

## 2016-02-14 MED ORDER — ZOLPIDEM TARTRATE 10 MG PO TABS: ORAL_TABLET | ORAL | Status: DC

## 2016-02-14 NOTE — Telephone Encounter (Signed)
Rx faxed to pharmacy, Jeanne Tran will handle the referral

## 2016-02-15 NOTE — Telephone Encounter (Signed)
Referral placed in the system. 

## 2016-02-19 ENCOUNTER — Ambulatory Visit (INDEPENDENT_AMBULATORY_CARE_PROVIDER_SITE_OTHER): Payer: Medicare Other | Admitting: Gastroenterology

## 2016-02-19 ENCOUNTER — Encounter: Payer: Self-pay | Admitting: Gastroenterology

## 2016-02-19 VITALS — BP 118/60 | HR 80 | Ht 64.0 in | Wt 156.2 lb

## 2016-02-19 DIAGNOSIS — K589 Irritable bowel syndrome without diarrhea: Secondary | ICD-10-CM | POA: Diagnosis not present

## 2016-02-19 DIAGNOSIS — K529 Noninfective gastroenteritis and colitis, unspecified: Secondary | ICD-10-CM | POA: Diagnosis not present

## 2016-02-19 DIAGNOSIS — R197 Diarrhea, unspecified: Secondary | ICD-10-CM | POA: Diagnosis not present

## 2016-02-19 MED ORDER — DICYCLOMINE HCL 20 MG PO TABS: 20.0000 mg | ORAL_TABLET | Freq: Three times a day (TID) | ORAL | Status: DC

## 2016-02-19 NOTE — Progress Notes (Signed)
    History of Present Illness: This is an 82 year old female complaining of frequent bowel movements. Patient was previously followed by Dr. Delfin Edis. She has had irritable bowel syndrome for many years and she notes that gradually over the past few years she has had more frequent formed stools. She generally has 4-5 bowel movements per day and occasional urgency. She notes no medication or diet changes. Bentyl 20 mg twice daily has not helped her symptoms. She underwent colonoscopy by Dr. Olevia Perches in 05/2013 showing only severe sigmoid colon diverticulosis. CBC, LFTs, TSH, ESR in 12/2015 were normal. Denies weight loss, abdominal pain, constipation, change in stool caliber, melena, hematochezia, nausea, vomiting, dysphagia, reflux symptoms, chest pain.  Current Medications, Allergies, Past Medical History, Past Surgical History, Family History and Social History were reviewed in [employer] record.  Physical Exam: General: Well developed, well nourished, no acute distress Head: Normocephalic and atraumatic Eyes:  sclerae anicteric, EOMI Ears: Normal auditory acuity Mouth: No deformity or lesions Lungs: Clear throughout to auscultation Heart: Regular rate and rhythm; no murmurs, rubs or bruits Abdomen: Soft, non tender and non distended. No masses, hepatosplenomegaly or hernias noted. Normal Bowel sounds Musculoskeletal: Symmetrical with no gross deformities  Pulses:  Normal pulses noted Extremities: No clubbing, cyanosis, edema or deformities noted Neurological: Alert oriented x 4, grossly nonfocal Psychological:  Alert and cooperative. Normal mood and affect  Assessment and Recommendations:  1. Irritable bowel syndrome which has slightly worsened over the years with frequent stools, urgency, diarrhea.  Avoid foods that trigger symptoms. Increase dicyclomine to 20 mg 4 times a day taken before meals and at bedtime. If symptoms are not adequate;y controlled begin Imodium  1 or 2 twice daily. Consider FODMAP diet and celiac disease screening if her symptoms persist. If her symptoms persist she is advised to call for a follow-up appt.

## 2016-02-19 NOTE — Patient Instructions (Addendum)
Increase your dicyclomine 20 mg to four times a day before meals and at bedtime. A new prescription has been sent to your pharmacy.   You can take Imodium AD twice daily as needed for diarrhea.   Call back in a few weeks if you see no improvement in your symptoms.  Thank you for choosing me and [facility].  Pricilla Riffle. Dagoberto Ligas., MD., Mar[facility] Regal

## 2016-02-26 DIAGNOSIS — M150 Primary generalized (osteo)arthritis: Secondary | ICD-10-CM | POA: Diagnosis not present

## 2016-02-26 DIAGNOSIS — M0609 Rheumatoid arthritis without rheumatoid factor, multiple sites: Secondary | ICD-10-CM | POA: Diagnosis not present

## 2016-02-26 DIAGNOSIS — M8580 Other specified disorders of bone density and structure, unspecified site: Secondary | ICD-10-CM | POA: Diagnosis not present

## 2016-02-29 ENCOUNTER — Telehealth: Payer: Self-pay | Admitting: Hematology

## 2016-02-29 NOTE — Telephone Encounter (Signed)
Lvm advising appt chgd from 6/22 due to md pal to 7/7 @ 1.30pm.

## 2016-03-07 ENCOUNTER — Ambulatory Visit: Payer: Medicare Other | Admitting: Hematology

## 2016-03-07 ENCOUNTER — Other Ambulatory Visit: Payer: Medicare Other

## 2016-03-22 ENCOUNTER — Ambulatory Visit (HOSPITAL_BASED_OUTPATIENT_CLINIC_OR_DEPARTMENT_OTHER): Payer: Medicare Other | Admitting: Hematology

## 2016-03-22 ENCOUNTER — Encounter: Payer: Self-pay | Admitting: Hematology

## 2016-03-22 ENCOUNTER — Telehealth: Payer: Self-pay | Admitting: Hematology

## 2016-03-22 ENCOUNTER — Other Ambulatory Visit (HOSPITAL_BASED_OUTPATIENT_CLINIC_OR_DEPARTMENT_OTHER): Payer: Medicare Other

## 2016-03-22 VITALS — BP 139/71 | HR 65 | Temp 98.3°F | Resp 18 | Ht 60.4 in | Wt 155.7 lb

## 2016-03-22 DIAGNOSIS — D0512 Intraductal carcinoma in situ of left breast: Secondary | ICD-10-CM | POA: Diagnosis not present

## 2016-03-22 DIAGNOSIS — Z853 Personal history of malignant neoplasm of breast: Secondary | ICD-10-CM | POA: Diagnosis not present

## 2016-03-22 DIAGNOSIS — C50912 Malignant neoplasm of unspecified site of left female breast: Secondary | ICD-10-CM

## 2016-03-22 DIAGNOSIS — M8580 Other specified disorders of bone density and structure, unspecified site: Secondary | ICD-10-CM | POA: Diagnosis not present

## 2016-03-22 LAB — COMPREHENSIVE METABOLIC PANEL
ALT: 15 U/L (ref 0–55)
AST: 20 U/L (ref 5–34)
Albumin: 3.8 g/dL (ref 3.5–5.0)
Alkaline Phosphatase: 128 U/L (ref 40–150)
Anion Gap: 9 mEq/L (ref 3–11)
BUN: 25.8 mg/dL (ref 7.0–26.0)
CHLORIDE: 105 meq/L (ref 98–109)
CO2: 27 meq/L (ref 22–29)
Calcium: 9.6 mg/dL (ref 8.4–10.4)
Creatinine: 1.5 mg/dL — ABNORMAL HIGH (ref 0.6–1.1)
EGFR: 33 mL/min/{1.73_m2} — AB (ref >90)
GLUCOSE: 98 mg/dL (ref 70–140)
POTASSIUM: 3.9 meq/L (ref 3.5–5.1)
SODIUM: 141 meq/L (ref 136–145)
TOTAL PROTEIN: 6.9 g/dL (ref 6.4–8.3)
Total Bilirubin: <0.3 mg/dL (ref 0.20–1.20)

## 2016-03-22 LAB — CBC WITH DIFFERENTIAL/PLATELET
BASO%: 0.7 % (ref 0.0–2.0)
Basophils Absolute: 0 10*3/uL (ref 0.0–0.1)
EOS ABS: 0.3 10*3/uL (ref 0.0–0.5)
EOS%: 5.7 % (ref 0.0–7.0)
HCT: 38.2 % (ref 34.8–46.6)
HGB: 12.9 g/dL (ref 11.6–15.9)
LYMPH%: 26.3 % (ref 14.0–49.7)
MCH: 31.6 pg (ref 25.1–34.0)
MCHC: 33.8 g/dL (ref 31.5–36.0)
MCV: 93.6 fL (ref 79.5–101.0)
MONO#: 0.6 10*3/uL (ref 0.1–0.9)
MONO%: 10.8 % (ref 0.0–14.0)
NEUT%: 56.5 % (ref 38.4–76.8)
NEUTROS ABS: 3.3 10*3/uL (ref 1.5–6.5)
Platelets: 166 10*3/uL (ref 145–400)
RBC: 4.08 10*6/uL (ref 3.70–5.45)
RDW: 13.6 % (ref 11.2–14.5)
WBC: 5.8 10*3/uL (ref 3.9–10.3)
lymph#: 1.5 10*3/uL (ref 0.9–3.3)

## 2016-03-22 MED ORDER — ANASTROZOLE 1 MG PO TABS: 1.0000 mg | ORAL_TABLET | Freq: Every day | ORAL | Status: DC

## 2016-03-22 NOTE — Progress Notes (Signed)
[facility] NOTE  Patient Care Team: Jeanne Medin, MD as PCP - General Jeanne Tran, OD (Optometry) Jeanne Kos, MD (Neurosurgery) Jeanne Confer, MD as Consulting Physician (General Surgery) Jeanne Merle, MD as Consulting Physician (Hematology) Jeanne Brod, MD as Consulting Physician (Orthopedic Surgery) Jeanne Artist, MD as Consulting Physician (Gastroenterology) Jeanne Fillers, MD as Consulting Physician (Ophthalmology) Jeanne Sine, MD as Consulting Physician (Dermatology)  Oncology History   Diagnosed in 1995, s/p lumpectomy and 5 years Tamoxifen. Records not available      Cancer of left breast ([facility])   1995 Cancer Diagnosis History of left breast cancer, status post lumpectomy and 5 years tamoxifen.   06/08/2014 Initial Diagnosis Cancer of left breast   06/08/2014 Initial Biopsy Left breast biopsy showed DCIS.   07/01/2014 Surgery Left breast lumpectomy, surgical margins were negative.   08/10/2014 -  Anti-estrogen oral therapy She started adjuvant tamoxifen, and was subsequently switched to anastrozole a few months later due to her anti-depression medications Zoloft.    CHIEF COMPLAINTS:  Follow up left breast DCIS   HISTORY OF PRESENTING ILLNESS:  Jeanne Tran 82 y.o. female is here because of recent diagnosis of left breast DCIS.   She is referred by Dr. Zella Tran because of a new diagnosis of left breast DCIS. She has a previous history of left breast cancer in 1995 and was treated with lumpectomy, axillary lymph node dissection, radiation, and 7 years tamoxifen. She has been followed by yearly mammogram. She was noted to have a new mass in the left breast on recent mammography at the 12 to 1:00 position, middle depth. Image guided biopsy was performed with the above pathology. The tumor is ER and PR positive.   She was seen by Dr. Zella Tran. She declined mastectomy, underwent lumpectomy on 07/01/2014. The surgery went well without any  complications. She has recovered well from the surgery, except mild fatigue after the surgery. She is able to function well at home. She denies any pain, dyspnea, abdominal discomfort, or any other symptoms.  In terms of breast cancer risk profile:  She menarched at early age of 89 and went to menopause at age mind 7's.   She had 3 pregnancy, her first child was born at age 7.   She did not breast-fed her child.  She did received birth control pills for several years.  She was on hormone replacement therapy for 12-13 years.  She has no family history of Breast/GYN/GI cancer  CURRENT THERAPY: Anastrozole 1 mg once daily  INTERIM HISTORY: Jeanne Tran returns for follow-up. She is doing well overall. She has been tolerating anastrozole very well, no significant hot flash, her chronic arthritis and arthralgia a mild and stable. No other new complaints.She lives by herself, independently, remains to be physically active.  MEDICAL HISTORY:  Past Medical History  Diagnosis Date  . Hyperlipidemia   . RLS (restless legs syndrome)   . Breast cancer ([facility])     hx of left with radiation and lumpectomy  . History of colonic polyps   . GERD (gastroesophageal reflux disease)   . History of ERCP     and sphincterotomy for abnormal lfts and dilated biliary ducts 5/05  . Pulmonary nodule   . Migraine headache   . Wears contact lenses   . Diverticulosis     SURGICAL HISTORY: Past Surgical History  Procedure Laterality Date  . Ercp      for abnormal lfts and dilated biliary ducts 5/05  . Sphincterotomy  for abnormal lfts and dilated biliary ducts 5/05  . Lymph node biopsy    . Cystocele repair    . Appendectomy    . Hemorrhoid surgery    . Tonsillectomy    . Cervical spine surgery  3/03  . Gallbladder duct open  2006  . Cholecystectomy  2012  . Orthoscopic rt knee    . Hysteroscopy    . Complete hysterectomy    . Breast lumpectomy  1995    lt lumpsnbx  . Abdominal hysterectomy       bso  . Breast lumpectomy with radioactive seed localization Left 07/01/2014    Procedure: LEFT BREAST LUMPECTOMY WITH RADIOACTIVE SEED LOCALIZATION;  Surgeon: Jeanne Confer, MD;  Location: [facility];  Service: General;  Laterality: Left;    SOCIAL HISTORY: History   Social History  . Marital Status: Widowed    Spouse Name: N/A    Number of Children: N/A  . Years of Education: N/A  She lives alone at home.   Occupational History  . Not on file.   Social History Main Topics  . Smoking status: Former Smoker -- 3.00 packs/day for 25 years    Types: Cigarettes    Quit date: 02/26/1994  . Smokeless tobacco: Never Used  . Alcohol Use: No  . Drug Use: No  . 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                    FAMILY HISTORY: No family history of malignancy.  ALLERGIES:  is allergic to codeine.  MEDICATIONS:  Current Outpatient Prescriptions  Medication Sig Dispense Refill  . anastrozole (ARIMIDEX) 1 MG tablet Take 1 tablet (1 mg total) by mouth daily. 90 tablet 1  . BIOTIN PO Take 2,000 mcg by mouth daily.    . cetirizine (ZYRTEC) 10 MG tablet Take 10 mg by mouth daily.    . Cholecalciferol (VITAMIN D3) 2000 UNITS TABS Take 1 tablet by mouth daily.    Marland Kitchen dicyclomine (BENTYL) 20 MG tablet Take 1 tablet (20 mg total) by mouth 4 (four) times daily -  before meals and at bedtime. 120 tablet 5  . esomeprazole (NEXIUM) 40 MG capsule Take 1 capsule (40 mg total) by mouth daily. 90 capsule 1  . fish oil-omega-3 fatty acids 1000 MG capsule Take 1 g by mouth daily.     . mirtazapine (REMERON) 15 MG tablet Take 1 tablet (15 mg total) by mouth at bedtime. Can increase to 2 hs for sleep 180 tablet 1  . Multiple Vitamin (MULTIVITAMIN) tablet Take 1 tablet by mouth daily.    . Probiotic Product ([employer] PROBIOTIC) CHEW Chew 2 capsules by mouth daily.    .  ranitidine (ZANTAC) 150 MG tablet take 1 tablet by mouth at bedtime MAY INCREASE TO 2 TABLETS AT BEDTIME 60 tablet 1  . rosuvastatin (CRESTOR) 5 MG tablet Take 1 tablet (5 mg total) by mouth daily. 90 tablet 2  . sertraline (ZOLOFT) 100 MG tablet Take 1 tablet (100 mg total) by mouth daily. 90 tablet 1  . zolpidem (AMBIEN) 10 MG tablet 5 mg hs po  if needed for sleep .Avoid daily use.can take extra 1/2 pill on occasion as needed 50 tablet 0   No current facility-administered medications  for this visit.    REVIEW OF SYSTEMS:   Constitutional: Denies fevers, chills or abnormal night sweats Eyes: Denies blurriness of vision, double vision or watery eyes Ears, nose, mouth, throat, and face: Denies mucositis or sore throat Respiratory: Denies cough, dyspnea or wheezes Cardiovascular: Denies palpitation, chest discomfort or lower extremity swelling Gastrointestinal:  Denies nausea, heartburn or change in bowel habits Skin: Denies abnormal skin rashes Lymphatics: Denies new lymphadenopathy or easy bruising Neurological:Denies numbness, tingling or new weaknesses Behavioral/Psych: Mood is stable, no new changes  All other systems were reviewed with the patient and are negative.  PHYSICAL EXAMINATION: ECOG PERFORMANCE STATUS: 0 - Asymptomatic  Filed Vitals:   03/22/16 1423  BP: 139/71  Pulse: 65  Temp: 98.3 F (36.8 C)  Resp: 18   Filed Weights   03/22/16 1423  Weight: 155 lb 11.2 oz (70.625 kg)    GENERAL:alert, no distress and comfortable SKIN: skin color, texture, turgor are normal, no rashes or significant lesions EYES: normal, conjunctiva are pink and non-injected, sclera clear OROPHARYNX:no exudate, no erythema and lips, buccal mucosa, and tongue normal  NECK: supple, thyroid normal size, non-tender, without nodularity LYMPH:  no palpable lymphadenopathy in the cervical, axillary or inguinal LUNGS: clear to auscultation and percussion with normal breathing effort HEART:  regular rate & rhythm and no murmurs and no lower extremity edema ABDOMEN:abdomen soft, non-tender and normal bowel sounds Musculoskeletal:no cyanosis of digits and no clubbing  PSYCH: alert & oriented x 3 with fluent speech NEURO: no focal motor/sensory deficits Breasts: Breast inspection showed a positive surgical scar at the left upper outer quadrant, well healed, no surrounding skin erythema or discharge. No skin change or nipple discharge. (+) fullness and firmness under the surgical scar, no palpable mass. Palpation of the right breasts and axilla revealed no obvious mass that I could appreciate.  LABORATORY DATA:  I have reviewed the data as listed  CBC Latest Ref Rng 03/22/2016 12/27/2015 09/07/2015  WBC 3.9 - 10.3 10e3/uL 5.8 6.7 6.1  Hemoglobin 11.6 - 15.9 g/dL 12.9 13.4 13.0  Hematocrit 34.8 - 46.6 % 38.2 39.8 40.3  Platelets 145 - 400 10e3/uL 166 202.0 190    CMP Latest Ref Rng 03/22/2016 02/05/2016 12/27/2015  Glucose 70 - 140 mg/dl 98 86 94  BUN 7.0 - 26.0 mg/dL 25.8 22 23   Creatinine 0.6 - 1.1 mg/dL 1.5(H) 1.19 1.24(H)  Sodium 136 - 145 mEq/L 141 139 139  Potassium 3.5 - 5.1 mEq/L 3.9 4.3 4.3  Chloride 96 - 112 mEq/L - 105 104  CO2 22 - 29 mEq/L 27 29 28   Calcium 8.4 - 10.4 mg/dL 9.6 9.5 9.8  Total Protein 6.4 - 8.3 g/dL 6.9 - 6.7  Total Bilirubin 0.20 - 1.20 mg/dL <0.30 - 0.5  Alkaline Phos 40 - 150 U/L 128 - 103  AST 5 - 34 U/L 20 - 25  ALT 0 - 55 U/L 15 - 17    Pathology report 07/01/2014 FINAL DIAGNOSIS Diagnosis 1. Breast, lumpectomy, left - DUCTAL CARCINOMA IN SITU WITH CALCIFICATIONS, LOW GRADE, SPANNING 1.8 CM. - THE SURGICAL RESECTION MARGINS ARE NEGATIVE FOR CARCINOMA. - SEE ONCOLOGY TABLE BELOW. 2. Breast, excision, medial margin left - FIBROCYSTIC CHANGES. - THERE IS NO EVIDENCE OF MALIGNANCY. - SEE COMMENT. 3. Breast, excision, superior margin left - FIBROCYSTIC CHANGES. - THERE IS NO EVIDENCE OF MALIGNANCY. - SEE COMMENT. 4. Breast, excision,  lateral margin left - FIBROCYSTIC CHANGES. - THERE IS NO EVIDENCE OF MALIGNANCY. - SEE COMMENT. 5. Breast,  excision, inferior margin left - FIBROCYSTIC CHANGES. - THERE IS NO EVIDENCE OF MALIGNANCY. - SEE COMMENT. Microscopic Comment 1. BREAST, IN SITU CARCINOMA Specimen, including laterality: Left breast Procedure (include lymph node sampling sentinel-non-sentinel: Lumpectomy and additional margin resection x 4 Grade of carcinoma: Low grade Necrosis: Not identified. Estimated tumor size: (gross measurement): 1.8 cm Treatment effect: N/A Distance to closest margin: Greater than 0.2 cm to all margins Breast prognostic profile: [phone] 1 of 3 FINAL for BEAUTIFULL, RIESBERG [id number]) Microscopic Comment(continued) Estrogen receptor: 100%, strong staining intensity Progesterone receptor: 100%, strong staining intensity TNM: pTis, pNX (JK:kh 07-04-14) 2. -5. The surgical resection  RADIOGRAPHIC STUDIES:  Bone density scan 11/15/2014 FINDINGS: AP LUMBAR SPINE L1-L4  Bone Mineral Density (BMD): 0.984 g/cm2  Young Adult T-Score: -0.6  Z-Score: 2.1  Left FEMUR neck  Bone Mineral Density (BMD): 0.700 g/cm2  Young Adult T-Score: -1.3  Z-Score: 0.9  ASSESSMENT: Patient's diagnostic category is LOW BONE MASS by WHO Criteria.  ASSESSMENT:  left breast DCIS s/p lumpectomy, ER/PR positive  PLAN:  #1 left breast DCIS, ER/PR positive The patient had early stage disease. She is considered to be cured by surgery alone.  Any form of adjuvant treatment is for prevention of disease recurrence.  Due to her prior left breast radiation history, no adjuvant radiation at this time I recommended adjuvant antiestrogen therapy to reduce risk of breast cancer in the future -She tolerated tamoxifen quite well, but she's not happy that her Zoloft was switched to Effexor. -So tamoxifen was switched to anastrozole. She is tolerating it well, we will plan for total 5  years. -She will continue annual mammogram, and physical exam for cancer surveillance - I encourage you to continue healthy diet and exercise regularly. - today's lab result were reviewed with her, unremarkable   #2 osteopenia  No history of osteoporosis. I recommend her take calcium 1 g a day and vitamin D. -Bone density scan  on 11/15/2014 showed osteopenia. -I discussed the option of adding biphosphonate for her os opinion, she really doesn't want take additional medication. -I discussed anastrozole may potentially make her osteopenia worse. We'll follow her bone density scan in 2 years  #3 depression -continue zoloft, she is quite happy with the medication.  Follow-up: 6 months with lab. Continue anastrozole. Mammogram is due in October.   All questions were answered. The patient knows to call the clinic with any problems, questions or concerns. I spent 15 minutes counseling the patient face to face. The total time spent in the appointment was 20 minutes and more than 50% was on counseling.     Jeanne Merle, MD 03/22/2016 2:53 PM

## 2016-03-22 NOTE — Telephone Encounter (Signed)
Gave and printed appt sched and avs for pt for Jan 2018 °

## 2016-05-03 ENCOUNTER — Telehealth: Payer: Self-pay | Admitting: Gastroenterology

## 2016-05-03 MED ORDER — DICYCLOMINE HCL 20 MG PO TABS: 20.0000 mg | ORAL_TABLET | Freq: Three times a day (TID) | ORAL | 1 refills | Status: DC

## 2016-05-03 NOTE — Telephone Encounter (Signed)
Bentyl for 90 day supply sent to patient's pharmacy. Patient notified and patient verbalized understanding.

## 2016-05-13 ENCOUNTER — Telehealth: Payer: Self-pay | Admitting: Family Medicine

## 2016-05-14 NOTE — Telephone Encounter (Signed)
Call in #[phone] with no rf  

## 2016-05-15 MED ORDER — ZOLPIDEM TARTRATE 10 MG PO TABS: ORAL_TABLET | ORAL | 0 refills | Status: DC

## 2016-05-15 NOTE — Telephone Encounter (Signed)
Called to the pharmacy and left on machine. 

## 2016-05-17 NOTE — Telephone Encounter (Signed)
Pt came by the office today.  She was upset that her 30 day prescription will cost more than a 90 day.  Informed her that she could wait until Tuesday and I will ask WP for a 90 day prescription.  In the meantime, she has no Ambien left.  Advised her to check with the pharmacy and see if they can give her a 4 day supply.  That will last until Dr. Regis Bill comes back.  Pt agrees and will call back if needed.

## 2016-05-20 NOTE — Telephone Encounter (Signed)
Ok to refill  X 1 my previous rx  Of 50 with same sig   Should be 90 days

## 2016-05-21 MED ORDER — ZOLPIDEM TARTRATE 10 MG PO TABS: ORAL_TABLET | ORAL | 0 refills | Status: DC

## 2016-05-21 NOTE — Telephone Encounter (Signed)
Called in refill and left on machine.

## 2016-05-21 NOTE — Addendum Note (Signed)
Addended by: Miles Costain T on: 05/21/2016 09:54 AM   Modules accepted: Orders

## 2016-05-29 DIAGNOSIS — Z23 Encounter for immunization: Secondary | ICD-10-CM | POA: Diagnosis not present

## 2016-07-09 DIAGNOSIS — R928 Other abnormal and inconclusive findings on diagnostic imaging of breast: Secondary | ICD-10-CM | POA: Diagnosis not present

## 2016-07-09 DIAGNOSIS — Z853 Personal history of malignant neoplasm of breast: Secondary | ICD-10-CM | POA: Diagnosis not present

## 2016-07-09 LAB — HM MAMMOGRAPHY

## 2016-07-10 ENCOUNTER — Ambulatory Visit (INDEPENDENT_AMBULATORY_CARE_PROVIDER_SITE_OTHER): Payer: Medicare Other | Admitting: Internal Medicine

## 2016-07-10 ENCOUNTER — Encounter: Payer: Self-pay | Admitting: Internal Medicine

## 2016-07-10 VITALS — BP 126/74 | Temp 98.4°F | Ht 63.75 in | Wt 156.2 lb

## 2016-07-10 DIAGNOSIS — Z853 Personal history of malignant neoplasm of breast: Secondary | ICD-10-CM

## 2016-07-10 DIAGNOSIS — G4700 Insomnia, unspecified: Secondary | ICD-10-CM

## 2016-07-10 DIAGNOSIS — L989 Disorder of the skin and subcutaneous tissue, unspecified: Secondary | ICD-10-CM

## 2016-07-10 DIAGNOSIS — K219 Gastro-esophageal reflux disease without esophagitis: Secondary | ICD-10-CM

## 2016-07-10 DIAGNOSIS — M2550 Pain in unspecified joint: Secondary | ICD-10-CM

## 2016-07-10 DIAGNOSIS — Z79899 Other long term (current) drug therapy: Secondary | ICD-10-CM | POA: Diagnosis not present

## 2016-07-10 DIAGNOSIS — Z8719 Personal history of other diseases of the digestive system: Secondary | ICD-10-CM

## 2016-07-10 MED ORDER — RANITIDINE HCL 150 MG PO TABS: ORAL_TABLET | ORAL | 1 refills | Status: DC

## 2016-07-10 NOTE — Patient Instructions (Signed)
Ok to stay on same ,medicine but can take the ranitidine  Twice a day to help cover for heart burn symptoms.  Continued caution with the sleep medications   ROV with me for exam and  meds  And labs in 6 months   Can make  wellness visit with Wynetta Fines our health coach    Any time before that.

## 2016-07-10 NOTE — Progress Notes (Signed)
Pre visit review using our clinic review tool, if applicable. No additional management support is needed unless otherwise documented below in the visit note.  Chief Complaint  Patient presents with  . Follow-up    HPI: Jeanne Tran 82 y.o. comes in today for chronic disease management medication evaluation.   GI  has weaned down off of the Nexium has Ranitidine   Hs can she take it twice a day for needed for breakthrough.  Arthritis is still there   All over.   Does her best tolerating it.  Sleep she is taking 15 mg of generic Remeron 5 mg of Ambien to help her sleep through the night and go back down if she gets up to go to bathroom. She thinks it works pretty well but asked if she could take 30 mg of Remeron as the bottle says if needed. At this point she denies mental fogginess falling her memory issues with this combination. She states that she is aware of the risks.  No unusual bleeding major change in vision or medical status.   ROS: See pertinent positives and negatives per HPI.  Past Medical History:  Diagnosis Date  . Breast cancer ([facility])    hx of left with radiation and lumpectomy  . Diverticulosis   . GERD (gastroesophageal reflux disease)   . History of colonic polyps   . History of ERCP    and sphincterotomy for abnormal lfts and dilated biliary ducts 5/05  . Hyperlipidemia   . Migraine headache   . Pulmonary nodule   . RLS (restless legs syndrome)   . Wears contact lenses     Family History  Problem Relation Age of Onset  . Mitral valve prolapse Son     with afib to have surgery    Social History   Social History  . Marital status: Widowed    Spouse name: N/A  . Number of children: N/A  . Years of education: N/A   Social History Main Topics  . Smoking status: Former Smoker    Packs/day: 3.00    Years: 25.00    Types: Cigarettes    Quit date: 02/26/1994  . Smokeless tobacco: Never Used  . Alcohol use No  . Drug use: No  . Sexual  activity: Not Asked   Other Topics Concern  . 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                    Outpatient Medications Prior to Visit  Medication Sig Dispense Refill  . anastrozole (ARIMIDEX) 1 MG tablet Take 1 tablet (1 mg total) by mouth daily. 90 tablet 1  . BIOTIN PO Take 2,000 mcg by mouth daily.    . cetirizine (ZYRTEC) 10 MG tablet Take 10 mg by mouth daily.    . Cholecalciferol (VITAMIN D3) 2000 UNITS TABS Take 1 tablet by mouth daily.    Marland Kitchen dicyclomine (BENTYL) 20 MG tablet Take 1 tablet (20 mg total) by mouth 4 (four) times daily -  before meals and at bedtime. 360 tablet 1  . fish oil-omega-3 fatty acids 1000 MG capsule Take 1 g by mouth daily.     . mirtazapine (REMERON) 15 MG tablet Take 1 tablet (15 mg total) by mouth at bedtime. Can increase to 2 hs for sleep 180 tablet 1  . Multiple  Vitamin (MULTIVITAMIN) tablet Take 1 tablet by mouth daily.    . Probiotic Product ([employer] PROBIOTIC) CHEW Chew 2 capsules by mouth daily.    . rosuvastatin (CRESTOR) 5 MG tablet Take 1 tablet (5 mg total) by mouth daily. 90 tablet 2  . sertraline (ZOLOFT) 100 MG tablet Take 1 tablet (100 mg total) by mouth daily. 90 tablet 1  . zolpidem (AMBIEN) 10 MG tablet 5 mg hs po  if needed for sleep .Avoid daily use.can take extra 1/2 pill on occasion as needed 50 tablet 0  . ranitidine (ZANTAC) 150 MG tablet take 1 tablet by mouth at bedtime MAY INCREASE TO 2 TABLETS AT BEDTIME 60 tablet 1  . esomeprazole (NEXIUM) 40 MG capsule Take 1 capsule (40 mg total) by mouth daily. 90 capsule 1   No facility-administered medications prior to visit.      EXAM:  BP 126/74 (BP Location: Right Arm, Patient Position: Sitting, Cuff Size: Normal)   Temp 98.4 F (36.9 C) (Oral)   Ht 5' 3.75" (1.619 m)   Wt 156 lb 3.2 oz (70.9 kg)   BMI 27.02 kg/m   Body mass index is 27.02 kg/m.  GENERAL: vitals reviewed and listed above,  alert, oriented, appears well hydrated and in no acute distress HEENT: atraumatic, conjunctiva  clear, no obvious abnormalities on inspection of external nose and ears Right eyelid droop beer than the left patient aware getting eye check NECK: no obvious masses on inspection palpation  LUNGS: clear to auscultation bilaterally, no wheezes, rales or rhonchi, good air movement CV: HRRR, no clubbing cyanosis or  peripheral edema nl cap refill  MS: moves all extremities without noticeable focal  abnormalityHas obvious arthritic changes in the hands but move quite well. Skin: 2 small excoriations left upper back associated with dry skin. PSYCH: pleasant and cooperative, no obvious depression or anxiety Lab Results  Component Value Date   WBC 5.8 03/22/2016   HGB 12.9 03/22/2016   HCT 38.2 03/22/2016   PLT 166 03/22/2016   GLUCOSE 98 03/22/2016   CHOL 193 12/27/2015   TRIG 133.0 12/27/2015   HDL 69.60 12/27/2015   LDLCALC 97 12/27/2015   ALT 15 03/22/2016   AST 20 03/22/2016   NA 141 03/22/2016   K 3.9 03/22/2016   CL 105 02/05/2016   CREATININE 1.5 (H) 03/22/2016   BUN 25.8 03/22/2016   CO2 27 03/22/2016   TSH 3.73 12/27/2015   INR 1.00 06/29/2014   HGBA1C 6.3 12/14/2013    ASSESSMENT AND PLAN:  Discussed the following assessment and plan:  INSOMNIA, CHRONIC  Medication management - Benefit more than risk at this time patient aware discussion  Gastroesophageal reflux disease, esophagitis presence not specified  BREAST CANCER, HX OF  Hx of hiatal hernia  Multiple joint pain w deformity - arthritis stable consdieration of  RA seronegative  vs  djd probabaly  Skin lesion His skin area persist for months see dermatologist possibly from dry skin scratch. Total visit 5mins > 50% spent counseling and coordinating care as indicated in above note and in instructions to patient .  -Patient advised to return or notify health care team  if symptoms worsen ,persist or new concerns  arise.  Patient Instructions  Ok to stay on same ,medicine but can take the ranitidine  Twice a day to help cover for heart burn symptoms.  Continued caution with the sleep medications   ROV with me for exam and  meds  And labs in 6 months  Can make  wellness visit with Wynetta Fines our health coach    Any time before that.     Standley Brooking. Lataya Varnell M.D.

## 2016-07-15 ENCOUNTER — Ambulatory Visit (INDEPENDENT_AMBULATORY_CARE_PROVIDER_SITE_OTHER): Payer: Medicare Other | Admitting: Internal Medicine

## 2016-07-15 ENCOUNTER — Encounter: Payer: Self-pay | Admitting: Internal Medicine

## 2016-07-15 DIAGNOSIS — G4700 Insomnia, unspecified: Secondary | ICD-10-CM

## 2016-07-15 NOTE — Patient Instructions (Addendum)
Since you are pretty comfortable with your current sleep situation, we had two ideas to try:  Try taking your remeron and ambien 20-30 minutes before bedtime, so they have time to act.  Try adding otc melatonin 3-6 mg, taken along with the other sleep meds. See if gradually you begin to fall asleep more easily  Please call as needed

## 2016-07-15 NOTE — Progress Notes (Signed)
07/15/2016-82 year old female former smoker referred courtesy of Dr Regis Bill  Remeron 15 or 30 mg, Ambien 10 mg Zoloft 100 mg She complains of difficulty initiating and maintaining sleep I had seen her 15 years ago-notes are no longer available. Taking Remeron 15 mg and Ambien 1/210 mg at bedtime. Sleeps very comfortably after 30-45 minutes sleep latency. Her main complaint today was the sleep latency area may wake once for bathroom. Usually wakes in the morning feeling she has had enough sleep. Bedtime between 11 and 12 PM, up at 9 AM. Sleeps alone. Not aware of limb movement disturbance, nightmares or snoring. Physically comfortable in bed with some arthritis pain for which she sometimes takes Tylenol.  Tried melatonin and several other sleep medicines years ago. Retired, widowed, lives with cat. 3 grown children. Epworth score 5/24  Prior to Admission medications   Medication Sig Start Date End Date Taking? Authorizing Provider  anastrozole (ARIMIDEX) 1 MG tablet Take 1 tablet (1 mg total) by mouth daily. 03/22/16  Yes Truitt Merle, MD  BIOTIN PO Take 2,000 mcg by mouth daily.   Yes Historical Provider, MD  cetirizine (ZYRTEC) 10 MG tablet Take 10 mg by mouth daily.   Yes Historical Provider, MD  Cholecalciferol (VITAMIN D3) 2000 UNITS TABS Take 1 tablet by mouth daily.   Yes Historical Provider, MD  dicyclomine (BENTYL) 20 MG tablet Take 1 tablet (20 mg total) by mouth 4 (four) times daily -  before meals and at bedtime. 05/03/16  Yes Ladene Artist, MD  fish oil-omega-3 fatty acids 1000 MG capsule Take 1 g by mouth daily.    Yes Historical Provider, MD  mirtazapine (REMERON) 15 MG tablet Take 1 tablet (15 mg total) by mouth at bedtime. Can increase to 2 hs for sleep 02/07/16  Yes Burnis Medin, MD  Multiple Vitamin (MULTIVITAMIN) tablet Take 1 tablet by mouth daily.   Yes Historical Provider, MD  Probiotic Product ([employer] PROBIOTIC) CHEW Chew 2 capsules by mouth daily.   Yes Historical Provider, MD   ranitidine (ZANTAC) 150 MG tablet Take 1 po bid or as directed 07/10/16  Yes Burnis Medin, MD  rosuvastatin (CRESTOR) 5 MG tablet Take 1 tablet (5 mg total) by mouth daily. 12/27/15  Yes Burnis Medin, MD  sertraline (ZOLOFT) 100 MG tablet Take 1 tablet (100 mg total) by mouth daily. 09/07/15  Yes Truitt Merle, MD  zolpidem (AMBIEN) 10 MG tablet 5 mg hs po  if needed for sleep .Avoid daily use.can take extra 1/2 pill on occasion as needed 05/21/16  Yes Burnis Medin, MD   Past Medical History:  Diagnosis Date  . Breast cancer (HCC)    hx of left with radiation and lumpectomy  . Diverticulosis   . GERD (gastroesophageal reflux disease)   . History of colonic polyps   . History of ERCP    and sphincterotomy for abnormal lfts and dilated biliary ducts 5/05  . Hyperlipidemia   . Migraine headache   . Pulmonary nodule   . RLS (restless legs syndrome)   . Wears contact lenses    Past Surgical History:  Procedure Laterality Date  . ABDOMINAL HYSTERECTOMY     bso  . APPENDECTOMY    . BREAST LUMPECTOMY  1995   lt lumpsnbx  . BREAST LUMPECTOMY WITH RADIOACTIVE SEED LOCALIZATION Left 07/01/2014   Procedure: LEFT BREAST LUMPECTOMY WITH RADIOACTIVE SEED LOCALIZATION;  Surgeon: [name], MD;  Location: [facility];  Service: General;  Laterality: Left;  .  CERVICAL SPINE SURGERY  3/03  . CHOLECYSTECTOMY  2012  . Complete Hysterectomy    . CYSTOCELE REPAIR    . ERCP     for abnormal lfts and dilated biliary ducts 5/05  . gallbladder duct open  2006  . HEMORRHOID SURGERY    . HYSTEROSCOPY    . LYMPH NODE BIOPSY    . Orthoscopic Rt Knee    . SPHINCTEROTOMY     for abnormal lfts and dilated biliary ducts 5/05  . tonsillectomy     Family History  Problem Relation Age of Onset  . Mitral valve prolapse Son     with afib to have surgery   Social History   Social History  . Marital status: Widowed    Spouse name: N/A  . Number of children: N/A  . Years of education:  N/A   Occupational History  . Not on file.   Social History Main Topics  . Smoking status: Former Smoker    Packs/day: 3.00    Years: 25.00    Types: Cigarettes    Quit date: 02/26/1994  . Smokeless tobacco: Never Used  . Alcohol use No  . Drug use: No  . Sexual activity: Not on file   Other Topics Concern  . 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                   ROS-see HPI   "+" = pos Constitutional:    weight loss, night sweats, fevers, chills, fatigue, lassitude. HEENT:    headaches, difficulty swallowing, tooth/dental problems, sore throat,       sneezing, itching, ear ache, nasal congestion, post nasal drip, snoring CV:    chest pain, orthopnea, PND, swelling in lower extremities, anasarca,                                                     dizziness, palpitations Resp:   shortness of breath with exertion or at rest.                productive cough,   non-productive cough, coughing up of blood.              change in color of mucus.  wheezing.   Skin:    rash or lesions. GI:  No-   heartburn, indigestion, abdominal pain, nausea, vomiting, diarrhea,                 change in bowel habits, loss of appetite GU: dysuria, change in color of urine, no urgency or frequency.   flank pain. MS:   +joint pain, stiffness, decreased range of motion, back pain. Neuro-     nothing unusual Psych:  change in mood or affect.  depression or anxiety.   memory loss.  OBJ- Physical Exam General- Alert, Oriented, Affect-appropriate, Distress- none acute Skin- rash-none, lesions- none, excoriation- none Lymphadenopathy- none Head- atraumatic            Eyes- Gross vision intact, PERRLA, conjunctivae and secretions clear            Ears- Hearing, canals-normal            Nose- Clear,  no-Septal dev, mucus, polyps, erosion, perforation             Throat- Mallampati II , mucosa clear , drainage- none,  tonsils- atrophic Neck- flexible , trachea midline, no stridor , thyroid nl, carotid no bruit Chest - symmetrical excursion , unlabored           Heart/CV- RRR , no murmur , no gallop  , no rub, nl s1 s2                           - JVD- none , edema- none, stasis changes- none, varices- none           Lung- clear to P&A, wheeze- none, cough- none , dullness-none, rub- none           Chest wall-  Abd-  Br/ Gen/ Rectal- Not done, not indicated Extrem- cyanosis- none, clubbing, none, atrophy- none, strength- nl Neuro- grossly intact to observation

## 2016-07-15 NOTE — Assessment & Plan Note (Addendum)
She describes regular sleep habits and a comfortable bed without over sedation from current medications. Her only real complaint had been what she thought was excessively long latency to sleep onset. She was quite receptive to the idea that she could take her medications earlier so that they would have time to get in her bloodstream. I also suggested she read or listened radio for a little while in bed.

## 2016-08-05 ENCOUNTER — Other Ambulatory Visit: Payer: Self-pay | Admitting: Hematology

## 2016-08-05 DIAGNOSIS — F4322 Adjustment disorder with anxiety: Secondary | ICD-10-CM

## 2016-08-07 ENCOUNTER — Other Ambulatory Visit: Payer: Self-pay | Admitting: *Deleted

## 2016-08-07 DIAGNOSIS — F4322 Adjustment disorder with anxiety: Secondary | ICD-10-CM

## 2016-08-07 MED ORDER — SERTRALINE HCL 100 MG PO TABS: 100.0000 mg | ORAL_TABLET | Freq: Every day | ORAL | 1 refills | Status: DC

## 2016-08-12 ENCOUNTER — Other Ambulatory Visit: Payer: Self-pay | Admitting: Internal Medicine

## 2016-08-12 NOTE — Telephone Encounter (Signed)
Ok to refill x 1  

## 2016-08-14 NOTE — Telephone Encounter (Signed)
Called to the pharmacy and left on machine. 

## 2016-09-17 DIAGNOSIS — H04123 Dry eye syndrome of bilateral lacrimal glands: Secondary | ICD-10-CM | POA: Diagnosis not present

## 2016-09-17 DIAGNOSIS — H16223 Keratoconjunctivitis sicca, not specified as Sjogren's, bilateral: Secondary | ICD-10-CM | POA: Diagnosis not present

## 2016-09-19 ENCOUNTER — Other Ambulatory Visit: Payer: Self-pay | Admitting: *Deleted

## 2016-09-19 MED ORDER — ANASTROZOLE 1 MG PO TABS: 1.0000 mg | ORAL_TABLET | Freq: Every day | ORAL | 1 refills | Status: DC

## 2016-09-24 ENCOUNTER — Telehealth: Payer: Self-pay | Admitting: Hematology

## 2016-09-24 NOTE — Telephone Encounter (Signed)
left message to advise pt that her appt has been moved from 10/02/16 to 09/30/16 at 1:45 PM

## 2016-09-30 NOTE — Progress Notes (Signed)
[facility] PROGRESS NOTE  Patient Care Team: Burnis Medin, MD as PCP - General Dyke Maes, OD (Optometry) Kary Kos, MD (Neurosurgery) [name], MD as Consulting Physician (General Surgery) Truitt Merle, MD as Consulting Physician (Hematology) Daryll Brod, MD as Consulting Physician (Orthopedic Surgery) Ladene Artist, MD as Consulting Physician (Gastroenterology) Warden Fillers, MD as Consulting Physician (Ophthalmology) Harriett Sine, MD as Consulting Physician (Dermatology)  Oncology History   Diagnosed in 1995, s/p lumpectomy and 5 years Tamoxifen. Records not available      Ductal carcinoma in situ (DCIS) of left breast   1995 Cancer Diagnosis    History of left breast cancer, status post lumpectomy and 5 years tamoxifen.      06/08/2014 Initial Diagnosis    Cancer of left breast      06/08/2014 Initial Biopsy    Left breast biopsy showed DCIS.      07/01/2014 Surgery    Left breast lumpectomy, surgical margins were negative.      08/10/2014 -  Anti-estrogen oral therapy    She started adjuvant tamoxifen, and was subsequently switched to anastrozole a few months later due to her anti-depression medications Zoloft.       CHIEF COMPLAINTS:  Follow up left breast DCIS   HISTORY OF PRESENTING ILLNESS:  Jeanne Tran 83 y.o. female is here because of recent diagnosis of left breast DCIS.   She is referred by Dr. Zella Richer because of a new diagnosis of left breast DCIS. She has a previous history of left breast cancer in 1995 and was treated with lumpectomy, axillary lymph node dissection, radiation, and 7 years tamoxifen. She has been followed by yearly mammogram. She was noted to have a new mass in the left breast on recent mammography at the 12 to 1:00 position, middle depth. Image guided biopsy was performed with the above pathology. The tumor is ER and PR positive.   She was seen by Dr. Zella Richer. She declined mastectomy,  underwent lumpectomy on 07/01/2014. The surgery went well without any complications. She has recovered well from the surgery, except mild fatigue after the surgery. She is able to function well at home. She denies any pain, dyspnea, abdominal discomfort, or any other symptoms.  In terms of breast cancer risk profile:  She menarched at early age of 33 and went to menopause at age mind 30's.   She had 3 pregnancy, her first child was born at age 60.   She did not breast-fed her child.  She did received birth control pills for several years.  She was on hormone replacement therapy for 12-13 years.  She has no family history of Breast/GYN/GI cancer  CURRENT THERAPY: Anastrozole 1 mg once daily  INTERIM HISTORY: Jeanne Tran returns for follow-up. She has been doing well. She says she doesn't have any complaints today. She is still taking the anastrozole and isn't having any problems with it. She has been on it since November 2015. She has a good appetite and has gained weight. She lives alone and takes care of herself well. Denies tenderness at incision site.   MEDICAL HISTORY:  Past Medical History:  Diagnosis Date  . Breast cancer (HCC)    hx of left with radiation and lumpectomy  . Diverticulosis   . GERD (gastroesophageal reflux disease)   . History of colonic polyps   . History of ERCP    and sphincterotomy for abnormal lfts and dilated biliary ducts 5/05  . Hyperlipidemia   . Migraine headache   .  Pulmonary nodule   . RLS (restless legs syndrome)   . Wears contact lenses     SURGICAL HISTORY: Past Surgical History:  Procedure Laterality Date  . ABDOMINAL HYSTERECTOMY     bso  . APPENDECTOMY    . BREAST LUMPECTOMY  1995   lt lumpsnbx  . BREAST LUMPECTOMY WITH RADIOACTIVE SEED LOCALIZATION Left 07/01/2014   Procedure: LEFT BREAST LUMPECTOMY WITH RADIOACTIVE SEED LOCALIZATION;  Surgeon: [name], MD;  Location: [facility];  Service: General;  Laterality:  Left;  . CERVICAL SPINE SURGERY  3/03  . CHOLECYSTECTOMY  2012  . Complete Hysterectomy    . CYSTOCELE REPAIR    . ERCP     for abnormal lfts and dilated biliary ducts 5/05  . gallbladder duct open  2006  . HEMORRHOID SURGERY    . HYSTEROSCOPY    . LYMPH NODE BIOPSY    . Orthoscopic Rt Knee    . SPHINCTEROTOMY     for abnormal lfts and dilated biliary ducts 5/05  . tonsillectomy      SOCIAL HISTORY: History   Social History  . Marital Status: Widowed    Spouse Name: N/A    Number of Children: N/A  . Years of Education: N/A  She lives alone at home.   Occupational History  . Not on file.   Social History Main Topics  . Smoking status: Former Smoker -- 3.00 packs/day for 25 years    Types: Cigarettes    Quit date: 02/26/1994  . Smokeless tobacco: Never Used  . Alcohol Use: No  . Drug Use: No  . 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                    FAMILY HISTORY: No family history of malignancy.  ALLERGIES:  is allergic to asa [aspirin] and codeine.  MEDICATIONS:  Current Outpatient Prescriptions  Medication Sig Dispense Refill  . AMBIEN 10 MG tablet take 1/2 tablet by mouth at bedtime AVOID DAILY USE MAY TAKE ADDITIONAL 1/2 tablets if needed 50 tablet 0  . anastrozole (ARIMIDEX) 1 MG tablet Take 1 tablet (1 mg total) by mouth daily. 90 tablet 2  . BIOTIN PO Take 2,000 mcg by mouth daily.    . cetirizine (ZYRTEC) 10 MG tablet Take 10 mg by mouth daily.    . Cholecalciferol (VITAMIN D3) 2000 UNITS TABS Take 1 tablet by mouth daily.    Marland Kitchen dicyclomine (BENTYL) 20 MG tablet Take 1 tablet (20 mg total) by mouth 4 (four) times daily -  before meals and at bedtime. 360 tablet 1  . fish oil-omega-3 fatty acids 1000 MG capsule Take 1 g by mouth daily.     . mirtazapine (REMERON) 15 MG tablet Take 1 tablet (15 mg total) by mouth at bedtime. Can increase to 2  hs for sleep 180 tablet 1  . Multiple Vitamin (MULTIVITAMIN) tablet Take 1 tablet by mouth daily.    . Probiotic Product (CVS PROBIOTIC) CHEW Chew 2 capsules by mouth daily.    . ranitidine (ZANTAC) 150 MG tablet Take 1 po bid or as directed 60 tablet 1  . rosuvastatin (CRESTOR) 5 MG tablet Take 1 tablet (5 mg total) by mouth daily. 90 tablet 2  . sertraline (ZOLOFT) 100  MG tablet Take 1 tablet (100 mg total) by mouth daily. 90 tablet 1   No current facility-administered medications for this visit.     REVIEW OF SYSTEMS:   Constitutional: Denies fevers, chills or abnormal night sweats Eyes: Denies blurriness of vision, double vision or watery eyes Ears, nose, mouth, throat, and face: Denies mucositis or sore throat Respiratory: Denies cough, dyspnea or wheezes Cardiovascular: Denies palpitation, chest discomfort or lower extremity swelling Gastrointestinal:  Denies nausea, heartburn or change in bowel habits Skin: Denies abnormal skin rashes Lymphatics: Denies new lymphadenopathy or easy bruising Neurological:Denies numbness, tingling or new weaknesses Behavioral/Psych: Mood is stable, no new changes  All other systems were reviewed with the patient and are negative.  PHYSICAL EXAMINATION: ECOG PERFORMANCE STATUS: 0 - Asymptomatic  Vitals:   10/01/16 1424  BP: (!) 153/91  Pulse: 71  Resp: 17  Temp: 98 F (36.7 C)   Filed Weights   10/01/16 1424  Weight: 162 lb 8 oz (73.7 kg)   GENERAL:alert, no distress and comfortable SKIN: skin color, texture, turgor are normal, no rashes or significant lesions EYES: normal, conjunctiva are pink and non-injected, sclera clear OROPHARYNX:no exudate, no erythema and lips, buccal mucosa, and tongue normal  NECK: supple, thyroid normal size, non-tender, without nodularity LYMPH:  no palpable lymphadenopathy in the cervical, axillary or inguinal LUNGS: clear to auscultation and percussion with normal breathing effort HEART: regular rate &  rhythm and no murmurs and no lower extremity edema ABDOMEN:abdomen soft, non-tender and normal bowel sounds Musculoskeletal:no cyanosis of digits and no clubbing  PSYCH: alert & oriented x 3 with fluent speech NEURO: no focal motor/sensory deficits Breasts: Breast inspection showed a positive surgical scar at the left upper outer quadrant, well healed, no surrounding skin erythema or discharge. No skin change or nipple discharge. (+) fullness and firmness under the surgical scar, no palpable mass. Palpation of the right breasts and axilla revealed no obvious mass that I could appreciate.  LABORATORY DATA:  I have reviewed the data as listed  CBC Latest Ref Rng & Units 10/01/2016 03/22/2016 12/27/2015  WBC 3.9 - 10.3 10e3/uL 5.8 5.8 6.7  Hemoglobin 11.6 - 15.9 g/dL 12.9 12.9 13.4  Hematocrit 34.8 - 46.6 % 38.7 38.2 39.8  Platelets 145 - 400 10e3/uL 187 166 202.0    CMP Latest Ref Rng & Units 10/01/2016 03/22/2016 02/05/2016  Glucose 70 - 140 mg/dl 100 98 86  BUN 7.0 - 26.0 mg/dL 25.7 25.8 22  Creatinine 0.6 - 1.1 mg/dL 1.3(H) 1.5(H) 1.19  Sodium 136 - 145 mEq/L 139 141 139  Potassium 3.5 - 5.1 mEq/L 4.3 3.9 4.3  Chloride 96 - 112 mEq/L - - 105  CO2 22 - 29 mEq/L 25 27 29   Calcium 8.4 - 10.4 mg/dL 9.7 9.6 9.5  Total Protein 6.4 - 8.3 g/dL 7.0 6.9 -  Total Bilirubin 0.20 - 1.20 mg/dL 0.44 <0.30 -  Alkaline Phos 40 - 150 U/L 115 128 -  AST 5 - 34 U/L 24 20 -  ALT 0 - 55 U/L 15 15 -    Pathology report 07/01/2014 FINAL DIAGNOSIS Diagnosis 1. Breast, lumpectomy, left - DUCTAL CARCINOMA IN SITU WITH CALCIFICATIONS, LOW GRADE, SPANNING 1.8 CM. - THE SURGICAL RESECTION MARGINS ARE NEGATIVE FOR CARCINOMA. - SEE ONCOLOGY TABLE BELOW. 2. Breast, excision, medial margin left - FIBROCYSTIC CHANGES. - THERE IS NO EVIDENCE OF MALIGNANCY. - SEE COMMENT. 3. Breast, excision, superior margin left - FIBROCYSTIC CHANGES. - THERE IS NO EVIDENCE OF MALIGNANCY. -  SEE COMMENT. 4. Breast, excision,  lateral margin left - FIBROCYSTIC CHANGES. - THERE IS NO EVIDENCE OF MALIGNANCY. - SEE COMMENT. 5. Breast, excision, inferior margin left - FIBROCYSTIC CHANGES. - THERE IS NO EVIDENCE OF MALIGNANCY. - SEE COMMENT. Microscopic Comment 1. BREAST, IN SITU CARCINOMA Specimen, including laterality: Left breast Procedure (include lymph node sampling sentinel-non-sentinel: Lumpectomy and additional margin resection x 4 Grade of carcinoma: Low grade Necrosis: Not identified. Estimated tumor size: (gross measurement): 1.8 cm Treatment effect: N/A Distance to closest margin: Greater than 0.2 cm to all margins Breast prognostic profile: [id number] 1 of 3 FINAL for Jeanne Tran, Jeanne Tran ([id number]) Microscopic Comment(continued) Estrogen receptor: 100%, strong staining intensity Progesterone receptor: 100%, strong staining intensity TNM: pTis, pNX (JK:kh 07-04-14) 2. -5. The surgical resection  RADIOGRAPHIC STUDIES:  Bone density scan 11/15/2014 FINDINGS: AP LUMBAR SPINE L1-L4  Bone Mineral Density (BMD): 0.984 g/cm2  Young Adult T-Score: -0.6  Z-Score: 2.1  Left FEMUR neck  Bone Mineral Density (BMD): 0.700 g/cm2  Young Adult T-Score: -1.3  Z-Score: 0.9  ASSESSMENT: Patient's diagnostic category is LOW BONE MASS by WHO Criteria.  ASSESSMENT:  83 y.o. female, with left breast DCIS s/p lumpectomy, ER/PR positive  PLAN:  #1 left breast DCIS, ER/PR positive The patient had early stage disease. She is considered to be cured by surgery alone.  Any form of adjuvant treatment is for prevention of disease recurrence.  Due to her prior left breast radiation history, no adjuvant radiation at this time I recommended adjuvant antiestrogen therapy to reduce risk of breast cancer in the future -She tolerated tamoxifen quite well, but she's not happy that her Zoloft was switched to Effexor. -So tamoxifen was switched to anastrozole. She is tolerating it well, we will  plan for total 5 years. -She will continue annual mammogram, and physical exam for cancer surveillance - I encourage you to continue healthy diet and exercise regularly. - today's lab result were reviewed with her, unremarkable -Previously reviewed her mammogram results from October 2017.  #2 osteopenia - I recommend her continue taking calcium 1 g a day and vitamin D. -Bone density scan  on 11/15/2014 showed osteopenia. -I discussed the option of adding biphosphonate for her os opinion, she really doesn't want take additional medication. -I discussed anastrozole may potentially make her osteopenia worse. We'll follow her bone density scan in 2 years  #3 depression -continue zoloft, she is quite happy with the medication.  Plan -I have refilled her arimidex today.  -I have scheduled her a bone density scan in 2 months.  -Follow-up in 6 months with lab. Continue anastrozole.   All questions were answered. The patient knows to call the clinic with any problems, questions or concerns. I spent 15 minutes counseling the patient face to face. The total time spent in the appointment was 20 minutes and more than 50% was on counseling.  This document serves as a record of services personally performed by Truitt Merle, MD. It was created on her behalf by Martinique Casey, a trained medical scribe. The creation of this record is based on the scribe's personal observations and the provider's statements to them. This document has been checked and approved by the attending provider.   Truitt Merle, MD 10/01/16

## 2016-10-01 ENCOUNTER — Ambulatory Visit (HOSPITAL_BASED_OUTPATIENT_CLINIC_OR_DEPARTMENT_OTHER): Payer: Medicare Other | Admitting: Hematology

## 2016-10-01 ENCOUNTER — Telehealth: Payer: Self-pay | Admitting: Hematology

## 2016-10-01 ENCOUNTER — Other Ambulatory Visit (HOSPITAL_BASED_OUTPATIENT_CLINIC_OR_DEPARTMENT_OTHER): Payer: Medicare Other

## 2016-10-01 ENCOUNTER — Encounter: Payer: Self-pay | Admitting: Hematology

## 2016-10-01 VITALS — BP 153/91 | HR 71 | Temp 98.0°F | Resp 17 | Ht 64.0 in | Wt 162.5 lb

## 2016-10-01 DIAGNOSIS — F329 Major depressive disorder, single episode, unspecified: Secondary | ICD-10-CM | POA: Diagnosis not present

## 2016-10-01 DIAGNOSIS — M8580 Other specified disorders of bone density and structure, unspecified site: Secondary | ICD-10-CM | POA: Insufficient documentation

## 2016-10-01 DIAGNOSIS — F4322 Adjustment disorder with anxiety: Secondary | ICD-10-CM

## 2016-10-01 DIAGNOSIS — D0512 Intraductal carcinoma in situ of left breast: Secondary | ICD-10-CM | POA: Diagnosis not present

## 2016-10-01 DIAGNOSIS — E2839 Other primary ovarian failure: Secondary | ICD-10-CM

## 2016-10-01 LAB — CBC WITH DIFFERENTIAL/PLATELET
BASO%: 1 % (ref 0.0–2.0)
BASOS ABS: 0.1 10*3/uL (ref 0.0–0.1)
EOS%: 3.5 % (ref 0.0–7.0)
Eosinophils Absolute: 0.2 10*3/uL (ref 0.0–0.5)
HCT: 38.7 % (ref 34.8–46.6)
HGB: 12.9 g/dL (ref 11.6–15.9)
LYMPH%: 23.4 % (ref 14.0–49.7)
MCH: 31.5 pg (ref 25.1–34.0)
MCHC: 33.3 g/dL (ref 31.5–36.0)
MCV: 94.6 fL (ref 79.5–101.0)
MONO#: 0.6 10*3/uL (ref 0.1–0.9)
MONO%: 10.2 % (ref 0.0–14.0)
NEUT#: 3.6 10*3/uL (ref 1.5–6.5)
NEUT%: 61.9 % (ref 38.4–76.8)
Platelets: 187 10*3/uL (ref 145–400)
RBC: 4.09 10*6/uL (ref 3.70–5.45)
RDW: 13.4 % (ref 11.2–14.5)
WBC: 5.8 10*3/uL (ref 3.9–10.3)
lymph#: 1.4 10*3/uL (ref 0.9–3.3)

## 2016-10-01 LAB — COMPREHENSIVE METABOLIC PANEL
ALT: 15 U/L (ref 0–55)
AST: 24 U/L (ref 5–34)
Albumin: 4.1 g/dL (ref 3.5–5.0)
Alkaline Phosphatase: 115 U/L (ref 40–150)
Anion Gap: 9 mEq/L (ref 3–11)
BUN: 25.7 mg/dL (ref 7.0–26.0)
CALCIUM: 9.7 mg/dL (ref 8.4–10.4)
CHLORIDE: 105 meq/L (ref 98–109)
CO2: 25 mEq/L (ref 22–29)
Creatinine: 1.3 mg/dL — ABNORMAL HIGH (ref 0.6–1.1)
EGFR: 39 mL/min/{1.73_m2} — ABNORMAL LOW (ref >90)
GLUCOSE: 100 mg/dL (ref 70–140)
POTASSIUM: 4.3 meq/L (ref 3.5–5.1)
SODIUM: 139 meq/L (ref 136–145)
Total Bilirubin: 0.44 mg/dL (ref 0.20–1.20)
Total Protein: 7 g/dL (ref 6.4–8.3)

## 2016-10-01 MED ORDER — ANASTROZOLE 1 MG PO TABS: 1.0000 mg | ORAL_TABLET | Freq: Every day | ORAL | 2 refills | Status: DC

## 2016-10-01 NOTE — Telephone Encounter (Signed)
Appointments scheduled per 1/16 LOS. Patient given AVS report and calendars with future scheduled appointments. °

## 2016-10-02 ENCOUNTER — Ambulatory Visit: Payer: Medicare Other | Admitting: Hematology

## 2016-10-02 ENCOUNTER — Other Ambulatory Visit: Payer: Medicare Other

## 2016-10-12 DIAGNOSIS — J22 Unspecified acute lower respiratory infection: Secondary | ICD-10-CM | POA: Diagnosis not present

## 2016-10-12 DIAGNOSIS — Z79899 Other long term (current) drug therapy: Secondary | ICD-10-CM | POA: Diagnosis not present

## 2016-10-12 DIAGNOSIS — J029 Acute pharyngitis, unspecified: Secondary | ICD-10-CM | POA: Diagnosis not present

## 2016-10-25 ENCOUNTER — Other Ambulatory Visit: Payer: Self-pay | Admitting: Family Medicine

## 2016-10-25 MED ORDER — RANITIDINE HCL 150 MG PO TABS: 150.0000 mg | ORAL_TABLET | Freq: Two times a day (BID) | ORAL | 0 refills | Status: DC

## 2016-10-25 NOTE — Telephone Encounter (Signed)
Sent to the pharmacy by e-scribe. 

## 2016-11-21 ENCOUNTER — Other Ambulatory Visit: Payer: Self-pay | Admitting: Internal Medicine

## 2016-11-29 ENCOUNTER — Other Ambulatory Visit: Payer: Self-pay | Admitting: Emergency Medicine

## 2016-11-29 ENCOUNTER — Telehealth: Payer: Self-pay | Admitting: Internal Medicine

## 2016-11-29 MED ORDER — ZOLPIDEM TARTRATE 10 MG PO TABS: ORAL_TABLET | ORAL | 0 refills | Status: DC

## 2016-11-29 NOTE — Telephone Encounter (Addendum)
Patient needs a refill on Ambien.  She states she needs the refill today.  She wants 90 tablets instead of 50 because 90 tablets cost the same amount for her.  Patient was upset because she states she has left multiple messages on the 2239 extention.

## 2016-11-29 NOTE — Telephone Encounter (Signed)
Ok to refill 90 pills   But continue to take 1/2 . Jeanne Tran should keep her April appt .   Please have dustin or shannon look in to the problem with messages .

## 2016-11-29 NOTE — Telephone Encounter (Signed)
Left a voicemail for pt to give the office  A call back.

## 2016-11-29 NOTE — Telephone Encounter (Signed)
Please advise 

## 2016-11-29 NOTE — Telephone Encounter (Signed)
Spoke with pt nothing further needed

## 2016-11-29 NOTE — Telephone Encounter (Signed)
Spoke with pt in regards to medication ( Ambien 10mg ) pt would like medication called in  To [facility]. Medication called in

## 2016-12-09 ENCOUNTER — Telehealth: Payer: Self-pay

## 2016-12-09 NOTE — Telephone Encounter (Signed)
Received PA request for Ambien from insurance company. PA submitted & is pending. Key: [id number]

## 2016-12-10 ENCOUNTER — Telehealth: Payer: Self-pay

## 2016-12-10 ENCOUNTER — Ambulatory Visit: Payer: Medicare Other

## 2016-12-10 VITALS — BP 117/60 | HR 72 | Ht 64.5 in | Wt 160.0 lb

## 2016-12-10 DIAGNOSIS — Z0000 Encounter for general adult medical examination without abnormal findings: Secondary | ICD-10-CM | POA: Diagnosis not present

## 2016-12-10 NOTE — Telephone Encounter (Signed)
Ms Lansdale in for AWV Stated Ambien 10mg  ordered 1/2 tab at hs plus an add'l one half tab as needed was refilled on 3/16 per torrie but they only gave her 15 pills. She has 3 left today. Has authorization letter dated 12/04/2016 with approval for Ambien 12/04/2016 to 12/04/2017.  Call placed to [employer] at [phone] and CS stated pharmacy filled incorrectly on 3/16  Called to the pharmacy and stated insurance had QL on order  Call to [employer] how still indicated the pharmacy was incorrect. Agreed to call the pharmacy  The pharmacy stated the rx was called in for 1/2 tab only but agrees they had 90 day supply.  Quoted med order for 1/2 tab (10mg  Ambien) at hs and can repeat 1/2 tab as needed x 90.  Pharmacy and Part D plan agreed to reverse the original order and give the patient the px with the rest of the copay due for the additional pills.  To call Ms.Lesnick to confirm.  Torrie, just FYI if you have questions.Manuela Schwartz
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
## 2016-12-10 NOTE — Telephone Encounter (Signed)
Call back from Ms. Yeske  Rx is being filled at pharmacy for ambien 10l 1/2 tab at hs and to repeat x 1 as needed; fill 90  To see Dr. Regis Bill in April

## 2016-12-10 NOTE — Patient Instructions (Addendum)
Ms. Huq , Thank you for taking time to come for your Medicare Wellness Visit. I appreciate your ongoing commitment to your health goals. Please review the following plan we discussed and let me know if I can assist you in the future.   Will discuss shingles with Dr. Peggye Ley or check with insurance company  Educated to check with insurance regarding coverage of Shingles vaccination on Part D or Part B and may have lower co-pay if provided on the Part D side  Prevention of falls: Remove rugs or any tripping hazards in the home Use Non slip mats in bathtubs and showers Placing grab bars next to the toilet and or shower Placing handrails on both sides of the stair way Adding extra lighting in the home.   Personal safety issues reviewed:  1. Consider starting a community watch program per [facility] 2.  Changes batteries is smoke detector and/or carbon monoxide detector  3.  If you have firearms; keep them in a safe place 4.  Wear protection when in the sun; Always wear sunscreen or a hat; It is good to have your doctor check your skin annually or review any new areas of concern 5. Driving safety; Keep in the right lane; stay 3 car lengths behind the car in front of you on the highway; look 3 times prior to pulling out; carry your cell phone everywhere you go!    Learn about the Yellow Dot program:  The program allows first responders at your emergency to have access to who your physician is, as well as your medications and medical conditions.  Citizens requesting the Yellow Dot Packages should contact Master Corporal Nunzio Cobbs at the [facility] [phone] for the first week of the program and beginning the week after Easter citizens should contact their Scientist, physiological.  You only need 124m of calcium per day in food or supplement and weight bearing exercise      These are the goals we discussed: Goals    . Reduce fat intake  to X grams per day          Drinks a lot of water -  Decrease sweet and fat intake        This is a list of the screening recommended for you and due dates:  Health Maintenance  Topic Date Due  . Tetanus Vaccine  12/15/2023  . Flu Shot  Completed  . DEXA scan (bone density measurement)  Completed  . Pneumonia vaccines  Completed   Fall Prevention in the Home Falls can cause injuries and can affect people from all age groups. There are many simple things that you can do to make your home safe and to help prevent falls. What can I do on the outside of my home?  Regularly repair the edges of walkways and driveways and fix any cracks.  Remove high doorway thresholds.  Trim any shrubbery on the main path into your home.  Use bright outdoor lighting.  Clear walkways of debris and clutter, including tools and rocks.  Regularly check that handrails are securely fastened and in good repair. Both sides of any steps should have handrails.  Install guardrails along the edges of any raised decks or porches.  Have leaves, snow, and ice cleared regularly.  Use sand or salt on walkways during winter months.  In the garage, clean up any spills right away, including grease or oil spills. What can I do in the bathroom?  Use night lights.  Install grab bars by the toilet and in the tub and shower. Do not use towel bars as grab bars.  Use non-skid mats or decals on the floor of the tub or shower.  If you need to sit down while you are in the shower, use a plastic, non-slip stool.  Keep the floor dry. Immediately clean up any water that spills on the floor.  Remove soap buildup in the tub or shower on a regular basis.  Attach bath mats securely with double-sided non-slip rug tape.  Remove throw rugs and other tripping hazards from the floor. What can I do in the bedroom?  Use night lights.  Make sure that a bedside light is easy to reach.  Do not use oversized bedding that  drapes onto the floor.  Have a firm chair that has side arms to use for getting dressed.  Remove throw rugs and other tripping hazards from the floor. What can I do in the kitchen?  Clean up any spills right away.  Avoid walking on wet floors.  Place frequently used items in easy-to-reach places.  If you need to reach for something above you, use a sturdy step stool that has a grab bar.  Keep electrical cables out of the way.  Do not use floor polish or wax that makes floors slippery. If you have to use wax, make sure that it is non-skid floor wax.  Remove throw rugs and other tripping hazards from the floor. What can I do in the stairways?  Do not leave any items on the stairs.  Make sure that there are handrails on both sides of the stairs. Fix handrails that are broken or loose. Make sure that handrails are as long as the stairways.  Check any carpeting to make sure that it is firmly attached to the stairs. Fix any carpet that is loose or worn.  Avoid having throw rugs at the top or bottom of stairways, or secure the rugs with carpet tape to prevent them from moving.  Make sure that you have a light switch at the top of the stairs and the bottom of the stairs. If you do not have them, have them installed. What are some other fall prevention tips?  Wear closed-toe shoes that fit well and support your feet. Wear shoes that have rubber soles or low heels.  When you use a stepladder, make sure that it is completely opened and that the sides are firmly locked. Have someone hold the ladder while you are using it. Do not climb a closed stepladder.  Add color or contrast paint or tape to grab bars and handrails in your home. Place contrasting color strips on the first and last steps.  Use mobility aids as needed, such as canes, walkers, scooters, and crutches.  Turn on lights if it is dark. Replace any light bulbs that burn out.  Set up furniture so that there are clear paths.  Keep the furniture in the same spot.  Fix any uneven floor surfaces.  Choose a carpet design that does not hide the edge of steps of a stairway.  Be aware of any and all pets.  Review your medicines with your healthcare provider. Some medicines can cause dizziness or changes in blood pressure, which increase your risk of falling. Talk with your health care provider about other ways that you can decrease your risk of falls. This may include working with a physical therapist or trainer to improve your strength,  balance, and endurance. This information is not intended to replace advice given to you by your health care provider. Make sure you discuss any questions you have with your health care provider. Document Released: 08/23/2002 Document Revised: 01/30/2016 Document Reviewed: 10/07/2014 Elsevier Interactive Patient Education  2017 [place] and Cholesterol Restricted Diet Getting too much fat and cholesterol in your diet may cause health problems. Following this diet helps keep your fat and cholesterol at normal levels. This can keep you from getting sick. What types of fat should I choose?  Choose monosaturated and polyunsaturated fats. These are found in foods such as olive oil, canola oil, flaxseeds, walnuts, almonds, and seeds.  Eat more omega-3 fats. Good choices include salmon, mackerel, sardines, tuna, flaxseed oil, and ground flaxseeds.  Limit saturated fats. These are in animal products such as meats, butter, and cream. They can also be in plant products such as palm oil, palm kernel oil, and coconut oil.  Avoid foods with partially hydrogenated oils in them. These contain trans fats. Examples of foods that have trans fats are stick margarine, some tub margarines, cookies, crackers, and other baked goods. What general guidelines do I need to follow?  Check food labels. Look for the words "trans fat" and "saturated fat."  When preparing a meal:  Fill half of  your plate with vegetables and green salads.  Fill one fourth of your plate with whole grains. Look for the word "whole" as the first word in the ingredient list.  Fill one fourth of your plate with lean protein foods.  Eat more foods that have fiber, like apples, carrots, beans, peas, and barley.  Eat more home-cooked foods. Eat less at restaurants and buffets.  Limit or avoid alcohol.  Limit foods high in starch and sugar.  Limit fried foods.  Cook foods without frying them. Baking, boiling, grilling, and broiling are all great options.  Lose weight if you are overweight. Losing even a small amount of weight can help your overall health. It can also help prevent diseases such as diabetes and heart disease. What foods can I eat? Grains  Whole grains, such as whole wheat or whole grain breads, crackers, cereals, and pasta. Unsweetened oatmeal, bulgur, barley, quinoa, or brown rice. Corn or whole wheat flour tortillas. Vegetables  Fresh or frozen vegetables (raw, steamed, roasted, or grilled). Green salads. Fruits  All fresh, canned (in natural juice), or frozen fruits. Meat and Other Protein Products  Ground beef (85% or leaner), grass-fed beef, or beef trimmed of fat. Skinless chicken or [place]. Ground chicken or [place]. Pork trimmed of fat. All fish and seafood. Eggs. Dried beans, peas, or lentils. Unsalted nuts or seeds. Unsalted canned or dry beans. Dairy  Low-fat dairy products, such as skim or 1% milk, 2% or reduced-fat cheeses, low-fat ricotta or cottage cheese, or plain low-fat yogurt. Fats and Oils  Tub margarines without trans fats. Light or reduced-fat mayonnaise and salad dressings. Avocado. Olive, canola, sesame, or safflower oils. Natural peanut or almond butter (choose ones without added sugar and oil). The items listed above may not be a complete list of recommended foods or beverages. Contact your dietitian for more options.  What foods are not recommended? Grains    White bread. White pasta. White rice. Cornbread. Bagels, pastries, and croissants. Crackers that contain trans fat. Vegetables  White potatoes. Corn. Creamed or fried vegetables. Vegetables in a cheese sauce. Fruits  Dried fruits. Canned fruit in light or heavy syrup. Fruit  juice. Meat and Other Protein Products  Fatty cuts of meat. Ribs, chicken wings, bacon, sausage, bologna, salami, chitterlings, fatback, hot dogs, bratwurst, and packaged luncheon meats. Liver and organ meats. Dairy  Whole or 2% milk, cream, half-and-half, and cream cheese. Whole milk cheeses. Whole-fat or sweetened yogurt. Full-fat cheeses. Nondairy creamers and whipped toppings. Processed cheese, cheese spreads, or cheese curds. Sweets and Desserts  Corn syrup, sugars, honey, and molasses. Candy. Jam and jelly. Syrup. Sweetened cereals. Cookies, pies, cakes, donuts, muffins, and ice cream. Fats and Oils  Butter, stick margarine, lard, shortening, ghee, or bacon fat. Coconut, palm kernel, or palm oils. Beverages  Alcohol. Sweetened drinks (such as sodas, lemonade, and fruit drinks or punches). The items listed above may not be a complete list of foods and beverages to avoid. Contact your dietitian for more information.  This information is not intended to replace advice given to you by your health care provider. Make sure you discuss any questions you have with your health care provider. Document Released: 03/03/2012 Document Revised: 05/09/2016 Document Reviewed: 12/02/2013 Elsevier Interactive Patient Education  2017 [place] Maintenance, Female Adopting a healthy lifestyle and getting preventive care can go a long way to promote health and wellness. Talk with your health care provider about what schedule of regular examinations is right for you. This is a good chance for you to check in with your provider about disease prevention and staying healthy. In between checkups, there are plenty of things you  can do on your own. Experts have done a lot of research about which lifestyle changes and preventive measures are most likely to keep you healthy. Ask your health care provider for more information. Weight and diet Eat a healthy diet  Be sure to include plenty of vegetables, fruits, low-fat dairy products, and lean protein.  Do not eat a lot of foods high in solid fats, added sugars, or salt.  Get regular exercise. This is one of the most important things you can do for your health.  Most adults should exercise for at least 150 minutes each week. The exercise should increase your heart rate and make you sweat (moderate-intensity exercise).  Most adults should also do strengthening exercises at least twice a week. This is in addition to the moderate-intensity exercise. Maintain a healthy weight  Body mass index (BMI) is a measurement that can be used to identify possible weight problems. It estimates body fat based on height and weight. Your health care provider can help determine your BMI and help you achieve or maintain a healthy weight.  For females 49 years of age and older:  A BMI below 18.5 is considered underweight.  A BMI of 18.5 to 24.9 is normal.  A BMI of 25 to 29.9 is considered overweight.  A BMI of 30 and above is considered obese. Watch levels of cholesterol and blood lipids  You should start having your blood tested for lipids and cholesterol at 81 years of age, then have this test every 5 years.  You may need to have your cholesterol levels checked more often if:  Your lipid or cholesterol levels are high.  You are older than 81 years of age.  You are at high risk for heart disease. Cancer screening Lung Cancer  Lung cancer screening is recommended for adults 65-77 years old who are at high risk for lung cancer because of a history of smoking.  A yearly low-dose CT scan of the lungs is recommended for people  who:  Currently smoke.  Have quit within the  past 15 years.  Have at least a 30-pack-year history of smoking. A pack year is smoking an average of one pack of cigarettes a day for 1 year.  Yearly screening should continue until it has been 15 years since you quit.  Yearly screening should stop if you develop a health problem that would prevent you from having lung cancer treatment. Breast Cancer  Practice breast self-awareness. This means understanding how your breasts normally appear and feel.  It also means doing regular breast self-exams. Let your health care provider know about any changes, no matter how small.  If you are in your 20s or 30s, you should have a clinical breast exam (CBE) by a health care provider every 1-3 years as part of a regular health exam.  If you are 51 or older, have a CBE every year. Also consider having a breast X-ray (mammogram) every year.  If you have a family history of breast cancer, talk to your health care provider about genetic screening.  If you are at high risk for breast cancer, talk to your health care provider about having an MRI and a mammogram every year.  Breast cancer gene (BRCA) assessment is recommended for women who have family members with BRCA-related cancers. BRCA-related cancers include:  Breast.  Ovarian.  Tubal.  Peritoneal cancers.  Results of the assessment will determine the need for genetic counseling and BRCA1 and BRCA2 testing. Cervical Cancer  Your health care provider may recommend that you be screened regularly for cancer of the pelvic organs (ovaries, uterus, and vagina). This screening involves a pelvic examination, including checking for microscopic changes to the surface of your cervix (Pap test). You may be encouraged to have this screening done every 3 years, beginning at age 56.  For women ages 34-65, health care providers may recommend pelvic exams and Pap testing every 3 years, or they may recommend the Pap and pelvic exam, combined with testing for  human papilloma virus (HPV), every 5 years. Some types of HPV increase your risk of cervical cancer. Testing for HPV may also be done on women of any age with unclear Pap test results.  Other health care providers may not recommend any screening for nonpregnant women who are considered low risk for pelvic cancer and who do not have symptoms. Ask your health care provider if a screening pelvic exam is right for you.  If you have had past treatment for cervical cancer or a condition that could lead to cancer, you need Pap tests and screening for cancer for at least 20 years after your treatment. If Pap tests have been discontinued, your risk factors (such as having a new sexual partner) need to be reassessed to determine if screening should resume. Some women have medical problems that increase the chance of getting cervical cancer. In these cases, your health care provider may recommend more frequent screening and Pap tests. Colorectal Cancer  This type of cancer can be detected and often prevented.  Routine colorectal cancer screening usually begins at 81 years of age and continues through 81 years of age.  Your health care provider may recommend screening at an earlier age if you have risk factors for colon cancer.  Your health care provider may also recommend using home test kits to check for hidden blood in the stool.  A small camera at the end of a tube can be used to examine your colon directly (sigmoidoscopy or colonoscopy).  This is done to check for the earliest forms of colorectal cancer.  Routine screening usually begins at age 67.  Direct examination of the colon should be repeated every 5-10 years through 81 years of age. However, you may need to be screened more often if early forms of precancerous polyps or small growths are found. Skin Cancer  Check your skin from head to toe regularly.  Tell your health care provider about any new moles or changes in moles, especially if there  is a change in a mole's shape or color.  Also tell your health care provider if you have a mole that is larger than the size of a pencil eraser.  Always use sunscreen. Apply sunscreen liberally and repeatedly throughout the day.  Protect yourself by wearing long sleeves, pants, a wide-brimmed hat, and sunglasses whenever you are outside. Heart disease, diabetes, and high blood pressure  High blood pressure causes heart disease and increases the risk of stroke. High blood pressure is more likely to develop in:  People who have blood pressure in the high end of the normal range (130-139/85-89 mm Hg).  People who are overweight or obese.  People who are African American.  If you are 48-2 years of age, have your blood pressure checked every 3-5 years. If you are 41 years of age or older, have your blood pressure checked every year. You should have your blood pressure measured twice-once when you are at a hospital or clinic, and once when you are not at a hospital or clinic. Record the average of the two measurements. To check your blood pressure when you are not at a hospital or clinic, you can use:  An automated blood pressure machine at a pharmacy.  A home blood pressure monitor.  If you are between 61 years and 45 years old, ask your health care provider if you should take aspirin to prevent strokes.  Have regular diabetes screenings. This involves taking a blood sample to check your fasting blood sugar level.  If you are at a normal weight and have a low risk for diabetes, have this test once every three years after 81 years of age.  If you are overweight and have a high risk for diabetes, consider being tested at a younger age or more often. Preventing infection Hepatitis B  If you have a higher risk for hepatitis B, you should be screened for this virus. You are considered at high risk for hepatitis B if:  You were born in a country where hepatitis B is common. Ask your health  care provider which countries are considered high risk.  Your parents were born in a high-risk country, and you have not been immunized against hepatitis B (hepatitis B vaccine).  You have HIV or AIDS.  You use needles to inject street drugs.  You live with someone who has hepatitis B.  You have had sex with someone who has hepatitis B.  You get hemodialysis treatment.  You take certain medicines for conditions, including cancer, organ transplantation, and autoimmune conditions. Hepatitis C  Blood testing is recommended for:  Everyone born from [birth year] through 1963.  Anyone with known risk factors for hepatitis C. Sexually transmitted infections (STIs)  You should be screened for sexually transmitted infections (STIs) including gonorrhea and chlamydia if:  You are sexually active and are younger than 81 years of age.  You are older than 81 years of age and your health care provider tells you that you are at risk  for this type of infection.  Your sexual activity has changed since you were last screened and you are at an increased risk for chlamydia or gonorrhea. Ask your health care provider if you are at risk.  If you do not have HIV, but are at risk, it may be recommended that you take a prescription medicine daily to prevent HIV infection. This is called pre-exposure prophylaxis (PrEP). You are considered at risk if:  You are sexually active and do not regularly use condoms or know the HIV status of your partner(s).  You take drugs by injection.  You are sexually active with a partner who has HIV. Talk with your health care provider about whether you are at high risk of being infected with HIV. If you choose to begin PrEP, you should first be tested for HIV. You should then be tested every 3 months for as long as you are taking PrEP. Pregnancy  If you are premenopausal and you may become pregnant, ask your health care provider about preconception counseling.  If you may  become pregnant, take 400 to 800 micrograms (mcg) of folic acid every day.  If you want to prevent pregnancy, talk to your health care provider about birth control (contraception). Osteoporosis and menopause  Osteoporosis is a disease in which the bones lose minerals and strength with aging. This can result in serious bone fractures. Your risk for osteoporosis can be identified using a bone density scan.  If you are 62 years of age or older, or if you are at risk for osteoporosis and fractures, ask your health care provider if you should be screened.  Ask your health care provider whether you should take a calcium or vitamin D supplement to lower your risk for osteoporosis.  Menopause may have certain physical symptoms and risks.  Hormone replacement therapy may reduce some of these symptoms and risks. Talk to your health care provider about whether hormone replacement therapy is right for you. Follow these instructions at home:  Schedule regular health, dental, and eye exams.  Stay current with your immunizations.  Do not use any tobacco products including cigarettes, chewing tobacco, or electronic cigarettes.  If you are pregnant, do not drink alcohol.  If you are breastfeeding, limit how much and how often you drink alcohol.  Limit alcohol intake to no more than 1 drink per day for nonpregnant women. One drink equals 12 ounces of beer, 5 ounces of wine, or 1 ounces of hard liquor.  Do not use street drugs.  Do not share needles.  Ask your health care provider for help if you need support or information about quitting drugs.  Tell your health care provider if you often feel depressed.  Tell your health care provider if you have ever been abused or do not feel safe at home. This information is not intended to replace advice given to you by your health care provider. Make sure you discuss any questions you have with your health care provider. Document Released: 03/18/2011  Document Revised: 02/08/2016 Document Reviewed: 06/06/2015 Elsevier Interactive Patient Education  2017 [employer].

## 2017-01-07 NOTE — Progress Notes (Signed)
Chief Complaint  Patient presents with  . Follow-up    HPI: Jeanne Tran 83 y.o. come in for Chronic disease management   Sleep : High risk medication    Taking remeron and Lorrin Mais  Tried to do 30 mg rmeron but didn't work as well as 5 ambien and 15 remeron  Needs 90 days of generic crestor  And   No se of med did eat today Snack around cheese crackers and  Drink    Ice  .    No sugar .  Tumeric.  ? Can this help  And why?  90 days of  ambien.  Per    Using 1/2 and half. Given after    Breast cancer   FU  Sees recheck   Next month    Refill ranitidine ocass  Break through   ROS: See pertinent positives and negatives per HPI. No cp sob  Swelling    Some   Trigger finger     Uses bentyl as needed  Asks for  Lidex cream for as needed dermaitis for a few days at a time. Not on face .  Given by derm? In past.   Past Medical History:  Diagnosis Date  . Breast cancer (HCC)    hx of left with radiation and lumpectomy  . Diverticulosis   . GERD (gastroesophageal reflux disease)   . History of colonic polyps   . History of ERCP    and sphincterotomy for abnormal lfts and dilated biliary ducts 5/05  . Hyperlipidemia   . Migraine headache   . Pulmonary nodule   . RLS (restless legs syndrome)   . Wears contact lenses     Family History  Problem Relation Age of Onset  . Mitral valve prolapse Son     with afib to have surgery    Social History   Social History  . Marital status: Widowed    Spouse name: N/A  . Number of children: N/A  . Years of education: N/A   Social History Main Topics  . Smoking status: Former Smoker    Packs/day: 3.00    Years: 25.00    Types: Cigarettes    Quit date: 02/26/1994  . Smokeless tobacco: Never Used  . Alcohol use No  . Drug use: No  . Sexual activity: Not Asked   Other Topics Concern  . 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                     Outpatient Medications Prior to Visit  Medication Sig Dispense Refill  . anastrozole (ARIMIDEX) 1 MG tablet Take 1 tablet (1 mg total) by mouth daily. 90 tablet 2  . BIOTIN PO Take 2,000 mcg by mouth daily.    . cetirizine (ZYRTEC) 10 MG tablet Take 10 mg by mouth daily.    . Cholecalciferol (VITAMIN D3) 2000 UNITS TABS Take 1 tablet by mouth daily.    Marland Kitchen dicyclomine (BENTYL) 20 MG tablet Take 1 tablet (20 mg total) by mouth 4 (four) times daily -  before meals and at bedtime. 360 tablet 1  . fish oil-omega-3 fatty acids 1000 MG capsule Take 1 g by mouth daily.     . mirtazapine (REMERON) 15 MG tablet Take 1 tablet (15 mg total) by mouth at bedtime. Can increase to  2 hs for sleep 180 tablet 1  . Multiple Vitamin (MULTIVITAMIN) tablet Take 1 tablet by mouth daily.    . Probiotic Product (CVS PROBIOTIC) CHEW Chew 2 capsules by mouth daily.    . sertraline (ZOLOFT) 100 MG tablet Take 1 tablet (100 mg total) by mouth daily. 90 tablet 1  . zolpidem (AMBIEN) 10 MG tablet take 1/2 tablet by mouth at bedtime AVOID DAILY USE MAY TAKE ADDITIONAL 1/2 tablets if needed 90 tablet 0  . ranitidine (ZANTAC) 150 MG tablet Take 1 tablet (150 mg total) by mouth 2 (two) times daily. 180 tablet 0  . rosuvastatin (CRESTOR) 5 MG tablet take 1 tablet by mouth once daily 60 tablet 0   No facility-administered medications prior to visit.      EXAM:  BP 110/70 (BP Location: Right Arm, Patient Position: Sitting, Cuff Size: Normal)   Pulse 70   Temp 98.3 F (36.8 C) (Oral)   Ht 5' 4.5" (1.638 m)   Wt 160 lb 11.2 oz (72.9 kg)   BMI 27.16 kg/m   Body mass index is 27.16 kg/m.  GENERAL: vitals reviewed and listed above, alert, oriented, appears well hydrated and in no acute distress HEENT: atraumatic, conjunctiva  clear, no obvious abnormalities on inspection of external nose and ears  NECK: no obvious masses on inspection palpation  LUNGS: clear to auscultation bilaterally, no wheezes, rales or  rhonchi, CV: HRRR, no clubbing cyanosis or  peripheral edema nl cap refill  MS: moves all extremities some deformity  Hands  PSYCH: pleasant and cooperative, no obvious depression or anxiety cognition seems good but she got the wrong date for todays appt     Said May 3rd as the dat she thought of appt.  Lab Results  Component Value Date   WBC 5.8 10/01/2016   HGB 12.9 10/01/2016   HCT 38.7 10/01/2016   PLT 187 10/01/2016   GLUCOSE 94 01/08/2017   CHOL 203 (H) 01/08/2017   TRIG 174.0 (H) 01/08/2017   HDL 67.20 01/08/2017   LDLCALC 101 (H) 01/08/2017   ALT 15 10/01/2016   AST 24 10/01/2016   NA 140 01/08/2017   K 4.5 01/08/2017   CL 103 01/08/2017   CREATININE 1.39 (H) 01/08/2017   BUN 31 (H) 01/08/2017   CO2 31 01/08/2017   TSH 3.73 12/27/2015   INR 1.00 06/29/2014   HGBA1C 6.3 12/14/2013   BP Readings from Last 3 Encounters:  01/08/17 110/70  12/10/16 117/60  10/01/16 (!) 153/91    ASSESSMENT AND PLAN:  Discussed the following assessment and plan:  Hyperlipidemia, unspecified hyperlipidemia type - Plan: Lipid panel, Basic metabolic panel  Medication management - Plan: Lipid panel, Basic metabolic panel  Multiple joint pain w deformity  Gastroesophageal reflux disease, esophagitis presence not specified  Renal insufficiency - Plan: Lipid panel, Basic metabolic panel  Seronegative arthritis Exam meds and labs  NF lb today  Will send in crestor  And ranitidinte  Labs today despite  Non fasting see above  -Patient advised to return or notify health care team  if  new concerns arise.  Patient Instructions  Continued caution with Ambien-type medicines. Can effect memeory and  Mental foginess  We'll let you know lab results when they're available. We should ask Dr. Lenna Gilford office to send [place] the last 2 notes of evaluation in regard to your arthritis. If you are not having increasing pain or symptoms we can follow-up here if not felt to be rheumatoid. If you are having  a  bad stomach day you can add on over-the-counter Prilosec for short-term not long-term. We'll refill your Crestor and ranitidine today as well as the cortisone cream. Plan follow-up visit in 6 months depending on lab results.    Standley Brooking. Vaiden Adames M.D.

## 2017-01-08 ENCOUNTER — Ambulatory Visit (INDEPENDENT_AMBULATORY_CARE_PROVIDER_SITE_OTHER): Payer: Medicare Other | Admitting: Internal Medicine

## 2017-01-08 ENCOUNTER — Encounter: Payer: Self-pay | Admitting: Internal Medicine

## 2017-01-08 VITALS — BP 110/70 | HR 70 | Temp 98.3°F | Ht 64.5 in | Wt 160.7 lb

## 2017-01-08 DIAGNOSIS — K219 Gastro-esophageal reflux disease without esophagitis: Secondary | ICD-10-CM

## 2017-01-08 DIAGNOSIS — M1380 Other specified arthritis, unspecified site: Secondary | ICD-10-CM | POA: Diagnosis not present

## 2017-01-08 DIAGNOSIS — N289 Disorder of kidney and ureter, unspecified: Secondary | ICD-10-CM | POA: Diagnosis not present

## 2017-01-08 DIAGNOSIS — Z79899 Other long term (current) drug therapy: Secondary | ICD-10-CM | POA: Diagnosis not present

## 2017-01-08 DIAGNOSIS — M2550 Pain in unspecified joint: Secondary | ICD-10-CM | POA: Diagnosis not present

## 2017-01-08 DIAGNOSIS — E785 Hyperlipidemia, unspecified: Secondary | ICD-10-CM | POA: Diagnosis not present

## 2017-01-08 LAB — BASIC METABOLIC PANEL
BUN: 31 mg/dL — AB (ref 6–23)
CHLORIDE: 103 meq/L (ref 96–112)
CO2: 31 mEq/L (ref 19–32)
Calcium: 9.9 mg/dL (ref 8.4–10.5)
Creatinine, Ser: 1.39 mg/dL — ABNORMAL HIGH (ref 0.40–1.20)
GFR: 38.65 mL/min — ABNORMAL LOW (ref >60.00)
GLUCOSE: 94 mg/dL (ref 70–99)
POTASSIUM: 4.5 meq/L (ref 3.5–5.1)
Sodium: 140 mEq/L (ref 135–145)

## 2017-01-08 LAB — LIPID PANEL
Cholesterol: 203 mg/dL — ABNORMAL HIGH (ref 0–200)
HDL: 67.2 mg/dL (ref >39.00)
LDL CALC: 101 mg/dL — AB (ref 0–99)
NONHDL: 136.05
Total CHOL/HDL Ratio: 3
Triglycerides: 174 mg/dL — ABNORMAL HIGH (ref 0.0–149.0)
VLDL: 34.8 mg/dL (ref 0.0–40.0)

## 2017-01-08 MED ORDER — ROSUVASTATIN CALCIUM 5 MG PO TABS: 5.0000 mg | ORAL_TABLET | Freq: Every day | ORAL | 3 refills | Status: DC

## 2017-01-08 MED ORDER — FLUOCINONIDE-E 0.05 % EX CREA: 1.0000 "application " | TOPICAL_CREAM | Freq: Two times a day (BID) | CUTANEOUS | 0 refills | Status: DC

## 2017-01-08 MED ORDER — RANITIDINE HCL 150 MG PO TABS: 150.0000 mg | ORAL_TABLET | Freq: Two times a day (BID) | ORAL | 3 refills | Status: DC

## 2017-01-08 NOTE — Patient Instructions (Addendum)
Continued caution with Ambien-type medicines. Can effect memeory and  Mental foginess  We'll let you know lab results when they're available. We should ask Dr. Lenna Gilford office to send [place] the last 2 notes of evaluation in regard to your arthritis. If you are not having increasing pain or symptoms we can follow-up here if not felt to be rheumatoid. If you are having a bad stomach day you can add on over-the-counter Prilosec for short-term not long-term. We'll refill your Crestor and ranitidine today as well as the cortisone cream. Plan follow-up visit in 6 months depending on lab results.

## 2017-01-14 DIAGNOSIS — H04332 Acute lacrimal canaliculitis of left lacrimal passage: Secondary | ICD-10-CM | POA: Diagnosis not present

## 2017-01-22 DIAGNOSIS — H04332 Acute lacrimal canaliculitis of left lacrimal passage: Secondary | ICD-10-CM | POA: Diagnosis not present

## 2017-01-25 ENCOUNTER — Other Ambulatory Visit: Payer: Self-pay | Admitting: Internal Medicine

## 2017-01-27 NOTE — Telephone Encounter (Signed)
She is taking both  Ok to refill for 6 months  remeron

## 2017-01-27 NOTE — Telephone Encounter (Signed)
Patient presented today to ask about her new Rx for mirtazapine (REMERON) 15 MG tablet.  States that she has 2 pills left and wants to get the Rx as soon as possible.  Please send Rx to [facility] at [facility] [employer] [facility] [employer].

## 2017-01-27 NOTE — Telephone Encounter (Signed)
Looked over your last note and Im not sure if you took pt off the Remeron because it was not working or is she taking the Remeron and the Ambien together PRN? Please advise.

## 2017-01-30 DIAGNOSIS — H04552 Acquired stenosis of left nasolacrimal duct: Secondary | ICD-10-CM | POA: Diagnosis not present

## 2017-01-30 DIAGNOSIS — H04332 Acute lacrimal canaliculitis of left lacrimal passage: Secondary | ICD-10-CM | POA: Diagnosis not present

## 2017-01-30 DIAGNOSIS — H11432 Conjunctival hyperemia, left eye: Secondary | ICD-10-CM | POA: Diagnosis not present

## 2017-02-06 DIAGNOSIS — H04222 Epiphora due to insufficient drainage, left lacrimal gland: Secondary | ICD-10-CM | POA: Diagnosis not present

## 2017-02-06 DIAGNOSIS — A4289 Other forms of actinomycosis: Secondary | ICD-10-CM | POA: Diagnosis not present

## 2017-02-06 DIAGNOSIS — H04412 Chronic dacryocystitis of left lacrimal passage: Secondary | ICD-10-CM | POA: Diagnosis not present

## 2017-02-06 DIAGNOSIS — H04552 Acquired stenosis of left nasolacrimal duct: Secondary | ICD-10-CM | POA: Diagnosis not present

## 2017-02-06 DIAGNOSIS — H11432 Conjunctival hyperemia, left eye: Secondary | ICD-10-CM | POA: Diagnosis not present

## 2017-02-06 DIAGNOSIS — H04332 Acute lacrimal canaliculitis of left lacrimal passage: Secondary | ICD-10-CM | POA: Diagnosis not present

## 2017-02-06 DIAGNOSIS — H04542 Stenosis of left lacrimal canaliculi: Secondary | ICD-10-CM | POA: Diagnosis not present

## 2017-02-18 ENCOUNTER — Other Ambulatory Visit: Payer: Self-pay | Admitting: Hematology

## 2017-02-18 DIAGNOSIS — F4322 Adjustment disorder with anxiety: Secondary | ICD-10-CM

## 2017-02-20 DIAGNOSIS — H04542 Stenosis of left lacrimal canaliculi: Secondary | ICD-10-CM | POA: Diagnosis not present

## 2017-02-20 DIAGNOSIS — H04412 Chronic dacryocystitis of left lacrimal passage: Secondary | ICD-10-CM | POA: Diagnosis not present

## 2017-02-20 DIAGNOSIS — H04222 Epiphora due to insufficient drainage, left lacrimal gland: Secondary | ICD-10-CM | POA: Diagnosis not present

## 2017-02-20 DIAGNOSIS — H04551 Acquired stenosis of right nasolacrimal duct: Secondary | ICD-10-CM | POA: Diagnosis not present

## 2017-02-20 DIAGNOSIS — H04552 Acquired stenosis of left nasolacrimal duct: Secondary | ICD-10-CM | POA: Diagnosis not present

## 2017-02-20 DIAGNOSIS — H11432 Conjunctival hyperemia, left eye: Secondary | ICD-10-CM | POA: Diagnosis not present

## 2017-03-16 ENCOUNTER — Telehealth: Payer: Self-pay | Admitting: Hematology

## 2017-03-16 NOTE — Telephone Encounter (Signed)
Lvm advising appt chgd from 7/12 (md call) to 8/2 @ 2.30pm.

## 2017-03-27 ENCOUNTER — Ambulatory Visit: Payer: Medicare Other | Admitting: Hematology

## 2017-03-27 ENCOUNTER — Other Ambulatory Visit: Payer: Medicare Other

## 2017-04-01 ENCOUNTER — Other Ambulatory Visit: Payer: Medicare Other

## 2017-04-01 ENCOUNTER — Ambulatory Visit: Payer: Medicare Other | Admitting: Hematology

## 2017-04-17 ENCOUNTER — Ambulatory Visit (HOSPITAL_BASED_OUTPATIENT_CLINIC_OR_DEPARTMENT_OTHER): Payer: Medicare Other | Admitting: Hematology

## 2017-04-17 ENCOUNTER — Encounter: Payer: Self-pay | Admitting: Hematology

## 2017-04-17 ENCOUNTER — Other Ambulatory Visit (HOSPITAL_BASED_OUTPATIENT_CLINIC_OR_DEPARTMENT_OTHER): Payer: Medicare Other

## 2017-04-17 VITALS — BP 136/69 | HR 64 | Temp 97.7°F | Resp 17 | Ht 64.5 in | Wt 161.4 lb

## 2017-04-17 DIAGNOSIS — M8580 Other specified disorders of bone density and structure, unspecified site: Secondary | ICD-10-CM

## 2017-04-17 DIAGNOSIS — D0512 Intraductal carcinoma in situ of left breast: Secondary | ICD-10-CM | POA: Diagnosis not present

## 2017-04-17 DIAGNOSIS — E2839 Other primary ovarian failure: Secondary | ICD-10-CM

## 2017-04-17 DIAGNOSIS — M880 Osteitis deformans of skull: Secondary | ICD-10-CM | POA: Diagnosis not present

## 2017-04-17 DIAGNOSIS — F329 Major depressive disorder, single episode, unspecified: Secondary | ICD-10-CM

## 2017-04-17 DIAGNOSIS — F4322 Adjustment disorder with anxiety: Secondary | ICD-10-CM

## 2017-04-17 DIAGNOSIS — Z170 Estrogen receptor positive status [ER+]: Secondary | ICD-10-CM

## 2017-04-17 LAB — CBC WITH DIFFERENTIAL/PLATELET
BASO%: 0.5 % (ref 0.0–2.0)
BASOS ABS: 0 10*3/uL (ref 0.0–0.1)
EOS%: 3.8 % (ref 0.0–7.0)
Eosinophils Absolute: 0.3 10*3/uL (ref 0.0–0.5)
HEMATOCRIT: 39.5 % (ref 34.8–46.6)
HGB: 13.2 g/dL (ref 11.6–15.9)
LYMPH#: 1.5 10*3/uL (ref 0.9–3.3)
LYMPH%: 20.2 % (ref 14.0–49.7)
MCH: 32.3 pg (ref 25.1–34.0)
MCHC: 33.4 g/dL (ref 31.5–36.0)
MCV: 96.6 fL (ref 79.5–101.0)
MONO#: 0.7 10*3/uL (ref 0.1–0.9)
MONO%: 9.4 % (ref 0.0–14.0)
NEUT#: 4.9 10*3/uL (ref 1.5–6.5)
NEUT%: 66.1 % (ref 38.4–76.8)
Platelets: 180 10*3/uL (ref 145–400)
RBC: 4.09 10*6/uL (ref 3.70–5.45)
RDW: 13.2 % (ref 11.2–14.5)
WBC: 7.4 10*3/uL (ref 3.9–10.3)

## 2017-04-17 LAB — COMPREHENSIVE METABOLIC PANEL
ALBUMIN: 4 g/dL (ref 3.5–5.0)
ALT: 15 U/L (ref 0–55)
AST: 22 U/L (ref 5–34)
Alkaline Phosphatase: 118 U/L (ref 40–150)
Anion Gap: 8 mEq/L (ref 3–11)
BUN: 27.3 mg/dL — AB (ref 7.0–26.0)
CO2: 28 mEq/L (ref 22–29)
CREATININE: 1.6 mg/dL — AB (ref 0.6–1.1)
Calcium: 10.1 mg/dL (ref 8.4–10.4)
Chloride: 106 mEq/L (ref 98–109)
EGFR: 31 mL/min/{1.73_m2} — ABNORMAL LOW (ref >90)
GLUCOSE: 91 mg/dL (ref 70–140)
POTASSIUM: 5 meq/L (ref 3.5–5.1)
SODIUM: 141 meq/L (ref 136–145)
Total Bilirubin: 0.25 mg/dL (ref 0.20–1.20)
Total Protein: 7.1 g/dL (ref 6.4–8.3)

## 2017-04-17 MED ORDER — ANASTROZOLE 1 MG PO TABS: 1.0000 mg | ORAL_TABLET | Freq: Every day | ORAL | 2 refills | Status: DC

## 2017-04-17 NOTE — Progress Notes (Signed)
[facility] PROGRESS NOTE  Patient Care Team: Panosh, Standley Brooking, MD as PCP - [place] Dy[place] Maes, [place] (Optometry) Kary Kos, MD (Neurosurgery) [name], MD as Consulting Physician (General Surgery) Truitt Merle, MD as Consulting Physician (Hematology) Daryll Brod, MD as Consulting Physician (Orthopedic Surgery) Ladene Artist, MD as Consulting Physician (Gastroenterology) Warden Fillers, MD as Consulting Physician (Ophthalmology) Harriett Sine, MD as Consulting Physician (Dermatology)  Oncology History   Diagnosed in 1995, s/p lumpectomy and 5 years Tamoxifen. Records not available      Ductal carcinoma in situ (DCIS) of left breast   1995 Cancer Diagnosis    History of left breast cancer, status post lumpectomy and 5 years tamoxifen.      06/08/2014 Initial Diagnosis    Cancer of left breast      06/08/2014 Initial Biopsy    Left breast biopsy showed DCIS.      07/01/2014 Surgery    Left breast lumpectomy, surgical margins were negative.      08/10/2014 -  Anti-estrogen oral therapy    She started adjuvant tamoxifen, and was subsequently switched to anastrozole a few months later due to her anti-depression medications Zoloft.       CHIEF COMPLAINTS:  Follow up left breast DCIS   HISTORY OF PRESENTING ILLNESS:  Jeanne Tran 83 y.o. female is here because of recent diagnosis of left breast DCIS.   She is referred by Dr. Zella Richer because of a new diagnosis of left breast DCIS. She has a previous history of left breast cancer in 1995 and was treated with lumpectomy, axillary lymph node dissection, radiation, and 7 years tamoxifen. She has been followed by yearly mammogram. She was noted to have a new mass in the left breast on recent mammography at the 12 to 1:00 position, middle depth. Image guided biopsy was performed with the above pathology. The tumor is ER and PR positive.   She was seen by Dr. Zella Richer. She declined  mastectomy, underwent lumpectomy on 07/01/2014. The surgery went well without any complications. She has recovered well from the surgery, except mild fatigue after the surgery. She is able to function well at home. She denies any pain, dyspnea, abdominal discomfort, or any other symptoms.  In terms of breast cancer risk profile:  She menarched at early age of 19 and went to menopause at age mind 56's.   She had 3 pregnancy, her first child was born at age 44.   She did not breast-fed her child.  She did received birth control pills for several years.  She was on hormone replacement therapy for 12-13 years.  She has no family history of Breast/GYN/GI cancer  CURRENT THERAPY: Anastrozole 1 mg once daily  INTERIM HISTORY: Jeanne Tran returns for follow-up. She has been doing well. She says she doesn't have any complaints today. She is still taking the anastrozole and isn't having any problems with it. She has been on it since November 2015. She has a good appetite and has gained weight. She lives alone and takes care of herself well. Denies tenderness at incision site.   On review of systems, pt denies fever, chills, weight loss, decreased appetite, decreased energy levels. Denies pain. Pt denies abdominal pain, nausea, vomiting.   MEDICAL HISTORY:  Past Medical History:  Diagnosis Date  . Breast cancer (HCC)    hx of left with radiation and lumpectomy  . Diverticulosis   . GERD (gastroesophageal reflux disease)   . History of colonic polyps   .  History of ERCP    and sphincterotomy for abnormal lfts and dilated biliary ducts 5/05  . Hyperlipidemia   . Migraine headache   . Pulmonary nodule   . RLS (restless legs syndrome)   . Wears contact lenses     SURGICAL HISTORY: Past Surgical History:  Procedure Laterality Date  . ABDOMINAL HYSTERECTOMY     bso  . APPENDECTOMY    . BREAST LUMPECTOMY  1995   lt lumpsnbx  . BREAST LUMPECTOMY WITH RADIOACTIVE SEED LOCALIZATION Left  07/01/2014   Procedure: LEFT BREAST LUMPECTOMY WITH RADIOACTIVE SEED LOCALIZATION;  Surgeon: [name], MD;  Location: [facility];  Service: General;  Laterality: Left;  . CERVICAL SPINE SURGERY  3/03  . CHOLECYSTECTOMY  2012  . Complete Hysterectomy    . CYSTOCELE REPAIR    . ERCP     for abnormal lfts and dilated biliary ducts 5/05  . gallbladder duct open  2006  . HEMORRHOID SURGERY    . HYSTEROSCOPY    . LYMPH NODE BIOPSY    . Orthoscopic Rt Knee    . SPHINCTEROTOMY     for abnormal lfts and dilated biliary ducts 5/05  . tonsillectomy      SOCIAL HISTORY: History   Social History  . Marital Status: Widowed    Spouse Name: N/A    Number of Children: N/A  . Years of Education: N/A  She lives alone at home.   Occupational History  . Not on file.   Social History Main Topics  . Smoking status: Former Smoker -- 3.00 packs/day for 25 years    Types: Cigarettes    Quit date: 02/26/1994  . Smokeless tobacco: Never Used  . Alcohol Use: No  . Drug Use: No  . 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                    FAMILY HISTORY: No family history of malignancy.  ALLERGIES:  is allergic to asa [aspirin] and codeine.  MEDICATIONS:  Current Outpatient Prescriptions  Medication Sig Dispense Refill  . anastrozole (ARIMIDEX) 1 MG tablet Take 1 tablet (1 mg total) by mouth daily. 90 tablet 2  . BIOTIN PO Take 2,000 mcg by mouth daily.    Marland Kitchen CALCIUM PO Take 1,000 mg by mouth.    . cetirizine (ZYRTEC) 10 MG tablet Take 10 mg by mouth daily.    . Cholecalciferol (VITAMIN D3) 2000 UNITS TABS Take 1 tablet by mouth daily.    Marland Kitchen dicyclomine (BENTYL) 20 MG tablet Take 1 tablet (20 mg total) by mouth 4 (four) times daily -  before meals and at bedtime. 360 tablet 1  . fish oil-omega-3 fatty acids 1000 MG capsule Take 1 g by mouth daily.     .  fluocinonide-emollient (LIDEX-E) 0.05 % cream Apply 1 application topically 2 (two) times daily. As  Directed. 30 g 0  . mirtazapine (REMERON) 15 MG tablet take 1 tablet by mouth at bedtime CAN INCREASE TO 2 TABLETS AT BEDTIME FOR SLEEP 180 tablet 1  . Multiple Vitamin (MULTIVITAMIN) tablet Take 1 tablet by mouth daily.    . Probiotic Product (CVS PROBIOTIC) CHEW Chew 2 capsules by mouth daily.    . ranitidine (ZANTAC) 150 MG tablet Take 1  tablet (150 mg total) by mouth 2 (two) times daily. 180 tablet 3  . rosuvastatin (CRESTOR) 5 MG tablet Take 1 tablet (5 mg total) by mouth daily. 90 tablet 3  . sertraline (ZOLOFT) 100 MG tablet take 1 tablet by mouth once daily 90 tablet 1  . zolpidem (AMBIEN) 10 MG tablet take 1/2 tablet by mouth at bedtime AVOID DAILY USE MAY TAKE ADDITIONAL 1/2 tablets if needed 90 tablet 0   No current facility-administered medications for this visit.     REVIEW OF SYSTEMS:  Constitutional: Denies fevers, chills or abnormal night sweats Eyes: Denies blurriness of vision, double vision or watery eyes Ears, nose, mouth, throat, and face: Denies mucositis or sore throat Respiratory: Denies cough, dyspnea or wheezes Cardiovascular: Denies palpitation, chest discomfort or lower extremity swelling Gastrointestinal:  Denies nausea, heartburn or change in bowel habits Skin: Denies abnormal skin rashes Lymphatics: Denies new lymphadenopathy or easy bruising Neurological:Denies numbness, tingling or new weaknesses Behavioral/Psych: Mood is stable, no new changes  All other systems were reviewed with the patient and are negative.  PHYSICAL EXAMINATION: ECOG PERFORMANCE STATUS: 0 - Asymptomatic  Vitals:   04/17/17 1559  BP: 136/69  Pulse: 64  Resp: 17  Temp: 97.7 F (36.5 C)   Filed Weights   04/17/17 1559  Weight: 161 lb 6.4 oz (73.2 kg)   GENERAL:alert, no distress and comfortable SKIN: skin color, texture, turgor are normal, no rashes or significant  lesions EYES: normal, conjunctiva are pink and non-injected, sclera clear OROPHARYNX:no exudate, no erythema and lips, buccal mucosa, and tongue normal  NECK: supple, thyroid normal size, non-tender, without nodularity LYMPH:  no palpable lymphadenopathy in the cervical, axillary or inguinal LUNGS: clear to auscultation and percussion with normal breathing effort HEART: regular rate & rhythm and no murmurs and no lower extremity edema ABDOMEN:abdomen soft, non-tender and normal bowel sounds Musculoskeletal:no cyanosis of digits and no clubbing  PSYCH: alert & oriented x 3 with fluent speech NEURO: no focal motor/sensory deficits Breasts: 1.5 cm nodule at 2-3 o'clock position of the left breast. skin retraction on the surgical scar. Breast inspection showed a positive surgical scar at the left upper outer quadrant, well healed, no surrounding skin erythema or discharge. No skin change or nipple discharge. Palpation of the right breasts and axilla revealed no obvious mass that I could appreciate.  LABORATORY DATA:  I have reviewed the data as listed  CBC Latest Ref Rng & Units 04/17/2017 10/01/2016 03/22/2016  WBC 3.9 - 10.3 10e3/uL 7.4 5.8 5.8  Hemoglobin 11.6 - 15.9 g/dL 13.2 12.9 12.9  Hematocrit 34.8 - 46.6 % 39.5 38.7 38.2  Platelets 145 - 400 10e3/uL 180 187 166    CMP Latest Ref Rng & Units 04/17/2017 01/08/2017 10/01/2016  Glucose 70 - 140 mg/dl 91 94 100  BUN 7.0 - 26.0 mg/dL 27.3(H) 31(H) 25.7  Creatinine 0.6 - 1.1 mg/dL 1.6(H) 1.39(H) 1.3(H)  Sodium 136 - 145 mEq/L 141 140 139  Potassium 3.5 - 5.1 mEq/L 5.0 4.5 4.3  Chloride 96 - 112 mEq/L - 103 -  CO2 22 - 29 mEq/L 28 31 25   Calcium 8.4 - 10.4 mg/dL 10.1 9.9 9.7  Total Protein 6.4 - 8.3 g/dL 7.1 - 7.0  Total Bilirubin 0.20 - 1.20 mg/dL 0.25 - 0.44  Alkaline Phos 40 - 150 U/L 118 - 115  AST 5 - 34 U/L 22 - 24  ALT 0 - 55 U/L 15 - 15    Pathology report 07/01/2014 FINAL DIAGNOSIS Diagnosis  1. Breast, lumpectomy, left -  DUCTAL CARCINOMA IN SITU WITH CALCIFICATIONS, LOW GRADE, SPANNING 1.8 CM. - THE SURGICAL RESECTION MARGINS ARE NEGATIVE FOR CARCINOMA. - SEE ONCOLOGY TABLE BELOW. 2. Breast, excision, medial margin left - FIBROCYSTIC CHANGES. - THERE IS NO EVIDENCE OF MALIGNANCY. - SEE COMMENT. 3. Breast, excision, superior margin left - FIBROCYSTIC CHANGES. - THERE IS NO EVIDENCE OF MALIGNANCY. - SEE COMMENT. 4. Breast, excision, lateral margin left - FIBROCYSTIC CHANGES. - THERE IS NO EVIDENCE OF MALIGNANCY. - SEE COMMENT. 5. Breast, excision, inferior margin left - FIBROCYSTIC CHANGES. - THERE IS NO EVIDENCE OF MALIGNANCY. - SEE COMMENT. Microscopic Comment 1. BREAST, IN SITU CARCINOMA Specimen, including laterality: Left breast Procedure (include lymph node sampling sentinel-non-sentinel: Lumpectomy and additional margin resection x 4 Grade of carcinoma: Low grade Necrosis: Not identified. Estimated tumor size: (gross measurement): 1.8 cm Treatment effect: N/A Distance to closest margin: Greater than 0.2 cm to all margins Breast prognostic profile: [phone] 1 of 3 FINAL for Jeanne, Tran ([id number]) Microscopic Comment(continued) Estrogen receptor: 100%, strong staining intensity Progesterone receptor: 100%, strong staining intensity TNM: pTis, pNX (JK:kh 07-04-14) 2. -5. The surgical resection  RADIOGRAPHIC STUDIES:  Bone density scan 11/15/2014 FINDINGS: AP LUMBAR SPINE L1-L4  Bone Mineral Density (BMD): 0.984 g/cm2  Young Adult T-Score: -0.6  Z-Score: 2.1  Left FEMUR neck  Bone Mineral Density (BMD): 0.700 g/cm2  Young Adult T-Score: -1.3  Z-Score: 0.9  ASSESSMENT: Patient's diagnostic category is LOW BONE MASS by WHO Criteria.  ASSESSMENT: 83 y.o. female, with left breast DCIS s/p lumpectomy, ER/PR positive  PLAN:  #1 left breast DCIS, ER/PR positive The patient had early stage disease. She is considered to be cured by surgery alone.   Any form of adjuvant treatment is for prevention of disease recurrence.  Due to her prior left breast radiation history, no adjuvant radiation at this time I recommended adjuvant antiestrogen therapy to reduce risk of breast cancer in the future -She tolerated tamoxifen quite well, but she's not happy that her Zoloft was switched to Effexor. -So tamoxifen was switched to anastrozole. She is tolerating it well, we will plan for total 5 years. -She will continue annual mammogram, and physical exam for cancer surveillance - I encourage her to continue healthy diet and exercise regularly. - today's lab result were reviewed with her, unremarkable, exam was unremarkable, except a subcutaneous nodule at the incision site of the left breast, likely scar tissue from surgery, no clinical concern for recurrence. -Previously reviewed her mammogram results from October 2017. -will order mammogram in October 2018 at [facility].  #2 osteopenia - I recommend her continue taking calcium 1 g a day and vitamin D. -Bone density scan  on 11/15/2014 showed osteopenia. -I discussed the option of adding biphosphonate for her os opinion, she really doesn't want take additional medication. -I discussed anastrozole may potentially make her osteopenia worse. We'll follow her bone density scan in 2 years -Ordered bone density scan for patient to evaluate for osteopenia.  #3 depression -continue zoloft, she is quite happy with the medication. -She is also on mirtazapine, ordered by her primary care physician. Patient is not sure why she needs to be on. I recommend her to stop. She takes Ambien for sleep.  Plan - Follow-up in 6 months with lab. - refilled anastrozole Rx today -follow up for mammogram and DEXA scan in October at [facility]    All questions were answered. The patient knows to call the clinic with any problems, questions or  concerns. I spent 20 minutes counseling the patient face to face. The total time spent  in the appointment was 25  minutes and more than 50% was on counseling  This document serves as a record of services personally performed by Truitt Merle, MD. It was created on her behalf by Steva Colder, a trained medical scribe. The creation of this record is based on the scribe's personal observations and the provider's statements to them. This document has been checked and approved by the attending provider.   Truitt Merle, MD 04/17/2017

## 2017-04-28 ENCOUNTER — Other Ambulatory Visit: Payer: Self-pay | Admitting: Internal Medicine

## 2017-04-29 ENCOUNTER — Telehealth: Payer: Self-pay

## 2017-04-29 NOTE — Telephone Encounter (Signed)
Received PA request for Zolpidem. PA submitted & pending. Key: [id number]

## 2017-04-30 NOTE — Telephone Encounter (Signed)
Called and spoke with insurance. Went over questions below & awaiting approval or denial.

## 2017-04-30 NOTE — Telephone Encounter (Signed)
[employer] prior auth called to request additional information fro prior auth on zolpidem (AMBIEN) 10 MG tablet Is pt enrolled in hospice? Is provider aware this is a high risk med for pt? Has pt tried silenor or belsoma  Ref [id number]

## 2017-05-02 NOTE — Telephone Encounter (Signed)
PA approved, form faxed back to pharmacy. 

## 2017-05-20 DIAGNOSIS — Z23 Encounter for immunization: Secondary | ICD-10-CM | POA: Diagnosis not present

## 2017-07-14 ENCOUNTER — Encounter: Payer: Self-pay | Admitting: Hematology

## 2017-07-14 DIAGNOSIS — M8589 Other specified disorders of bone density and structure, multiple sites: Secondary | ICD-10-CM | POA: Diagnosis not present

## 2017-07-14 DIAGNOSIS — R928 Other abnormal and inconclusive findings on diagnostic imaging of breast: Secondary | ICD-10-CM | POA: Diagnosis not present

## 2017-07-14 DIAGNOSIS — Z853 Personal history of malignant neoplasm of breast: Secondary | ICD-10-CM | POA: Diagnosis not present

## 2017-07-14 DIAGNOSIS — N6489 Other specified disorders of breast: Secondary | ICD-10-CM | POA: Diagnosis not present

## 2017-07-14 LAB — HM MAMMOGRAPHY

## 2017-07-14 LAB — HM DEXA SCAN

## 2017-07-16 ENCOUNTER — Encounter: Payer: Self-pay | Admitting: Hematology

## 2017-07-16 ENCOUNTER — Other Ambulatory Visit: Payer: Self-pay | Admitting: Radiology

## 2017-07-16 DIAGNOSIS — N6321 Unspecified lump in the left breast, upper outer quadrant: Secondary | ICD-10-CM | POA: Diagnosis not present

## 2017-07-16 DIAGNOSIS — N641 Fat necrosis of breast: Secondary | ICD-10-CM | POA: Diagnosis not present

## 2017-07-16 DIAGNOSIS — Z853 Personal history of malignant neoplasm of breast: Secondary | ICD-10-CM | POA: Diagnosis not present

## 2017-07-24 ENCOUNTER — Other Ambulatory Visit: Payer: Self-pay | Admitting: Internal Medicine

## 2017-07-25 ENCOUNTER — Other Ambulatory Visit: Payer: Self-pay | Admitting: Emergency Medicine

## 2017-07-29 NOTE — Telephone Encounter (Signed)
Okayed 04/29/17 #120 Pt was supposed to schedule an OV for further refills.  Note sent on Rx to pharmacy 04/29/17 Patient can have 30 day supply but will need OV for more.  Pt in lobby requesting a 90-day supply.  I spoke with patient and explained why she only received a 30 day supply in August and why she could only have a 30 day supply today if filled. Pt was very understanding, states that she was unaware of needing an appt with Dr Regis Bill.  Pt scheduled with Dr Regis Bill on 07/30/17 for her 6 month ROV and med refills. Pt states that she wants to wait until she sees Dr Regis Bill for this refill. Nothing further needed.

## 2017-07-29 NOTE — Progress Notes (Signed)
Chief Complaint  Patient presents with  . Medication Refill    Refills and 9mo rov    HPI: Jeanne Tran 83 y.o. come in for Chronic disease management  And med check    Poss ra  Arthritis  proccrastination    Not getting back to rheumatologist .  Bent fingers.  No changes so far lvigin with older joints she says   After a week off advised by oncology   remeron   And couldn't sleep at all.   So wnet back on 15 mg  Taking with    And ambien  5 mg  At night  .   And so far ok .  wors with out sig se   Using bentyl 4 x per day to avoid ugency "diarrhea" no se but would like refill   Refill lidex as needed   No new neuro sx falling   Depression    ROS: See pertinent positives and negatives per HPI. No cp sob  Falling depression   Past Medical History:  Diagnosis Date  . Breast cancer (HCC)    hx of left with radiation and lumpectomy  . Diverticulosis   . GERD (gastroesophageal reflux disease)   . History of colonic polyps   . History of ERCP    and sphincterotomy for abnormal lfts and dilated biliary ducts 5/05  . Hyperlipidemia   . Migraine headache   . Pulmonary nodule   . RLS (restless legs syndrome)   . Wears contact lenses     Family History  Problem Relation Age of Onset  . Mitral valve prolapse Son        with afib to have surgery    Social History   Socioeconomic History  . Marital status: Widowed    Spouse name: None  . Number of children: None  . Years of education: None  . Highest education level: None  Social Needs  . Financial resource strain: None  . Food insecurity - worry: None  . Food insecurity - inability: None  . Transportation needs - medical: None  . Transportation needs - non-medical: None  Occupational History  . None  Tobacco Use  . Smoking status: Former Smoker    Packs/day: 3.00    Years: 25.00    Pack years: 75.00    Types: Cigarettes    Last attempt to quit: 02/26/1994    Years since quitting: 23.4  . Smokeless  tobacco: Never Used  Substance and Sexual Activity  . Alcohol use: No  . Drug use: No  . Sexual activity: None  Other Topics Concern  . None  Social History Narrative   Retired   Single widowed    International aid/development worker   HH of 1   No   No tad and bridge.                    Outpatient Medications Prior to Visit  Medication Sig Dispense Refill  . anastrozole (ARIMIDEX) 1 MG tablet Take 1 tablet (1 mg total) by mouth daily. 90 tablet 2  . BIOTIN PO Take 2,000 mcg by mouth daily.    Marland Kitchen CALCIUM PO Take 1,000 mg by mouth.    . cetirizine (ZYRTEC) 10 MG tablet Take 10 mg by mouth daily.    . Cholecalciferol (VITAMIN D3) 2000 UNITS TABS Take 1 tablet by mouth daily.    . fish oil-omega-3 fatty acids 1000 MG capsule Take 1 g by mouth daily.     Marland Kitchen  mirtazapine (REMERON) 15 MG tablet take 1 tablet by mouth at bedtime CAN INCREASE TO 2 TABLETS AT BEDTIME FOR SLEEP 180 tablet 1  . Multiple Vitamin (MULTIVITAMIN) tablet Take 1 tablet by mouth daily.    . Probiotic Product ([employer] PROBIOTIC) CHEW Chew 2 capsules by mouth daily.    . ranitidine (ZANTAC) 150 MG tablet Take 1 tablet (150 mg total) by mouth 2 (two) times daily. 180 tablet 3  . rosuvastatin (CRESTOR) 5 MG tablet Take 1 tablet (5 mg total) by mouth daily. 90 tablet 3  . sertraline (ZOLOFT) 100 MG tablet take 1 tablet by mouth once daily 90 tablet 1  . zolpidem (AMBIEN) 10 MG tablet take 1/2 tablet by mouth at bedtime AVOID DAILY USE MAY TAKE ADDITIONAL 1/2 tablets if needed 90 tablet 0  . dicyclomine (BENTYL) 20 MG tablet TAKE 1 TABLET BY MOUTH FOUR TIMES DAILY BEFORE MEALS AND AT BEDTIME 120 tablet 0  . fluocinonide-emollient (LIDEX-E) 0.05 % cream Apply 1 application topically 2 (two) times daily. As  Directed. 30 g 0   No facility-administered medications prior to visit.      EXAM:  BP 100/66 (BP Location: Right Arm, Patient Position: Sitting, Cuff Size: Normal)   Pulse 83   Temp 98.1 F (36.7 C) (Oral)   Wt 163 lb 9.6 oz (74.2 kg)   BMI  27.65 kg/m   Body mass index is 27.65 kg/m.  GENERAL: vitals reviewed and listed above, alert, oriented, appears well hydrated and in no acute distress ,looks well mobil e  Some hand joint deformity  Look younger than sas HEENT: atraumatic, conjunctiva  clear, no obvious abnormalities on inspection of external nose and ears  NECK: no obvious masses on inspection palpation  LUNGS: clear to auscultation bilaterally, no wheezes, rales or rhonchi, good air movement CV: HRRR, no clubbing cyanosis or  peripheral edema nl cap refill  MS: moves all extremities  Contracture right hand ring finger  PSYCH: pleasant and cooperative, no obvious depression or anxiety Lab Results  Component Value Date   WBC 7.4 04/17/2017   HGB 13.2 04/17/2017   HCT 39.5 04/17/2017   PLT 180 04/17/2017   GLUCOSE 83 07/30/2017   CHOL 203 (H) 01/08/2017   TRIG 174.0 (H) 01/08/2017   HDL 67.20 01/08/2017   LDLCALC 101 (H) 01/08/2017   ALT 15 04/17/2017   AST 22 04/17/2017   NA 141 07/30/2017   K 4.3 07/30/2017   CL 103 07/30/2017   CREATININE 1.51 (H) 07/30/2017   BUN 27 (H) 07/30/2017   CO2 30 07/30/2017   TSH 3.73 12/27/2015   INR 1.00 06/29/2014   HGBA1C 6.3 12/14/2013   BP Readings from Last 3 Encounters:  07/30/17 100/66  04/17/17 136/69  01/08/17 110/70    ASSESSMENT AND PLAN:  Discussed the following assessment and plan:  Renal insufficiency - Plan: Basic metabolic panel  Medication management - Plan: Basic metabolic panel  Hyperlipidemia, unspecified hyperlipidemia type  High risk medication use  INSOMNIA, CHRONIC  BREAST CANCER, HX OF  Seronegative arthritis   reviewed med  Risk benefit of medication discussed.   For nwo remian on current regimen and can limit bentyl as needed  Bmp today to ensure stability   Mood seems  Stable and seems normal today  -Patient advised to return or notify health care team  if  new concerns arise.  Patient Instructions   Please  Fu with  rheumatology as  Discussed .   Ok to take the  bentyl as needed  .    Perhaps 2-3 x per day may work as well .   caution with  ambien as discussed  Can continue with the  Mirtazapine  Due for  Check up  Yearly visit in march April with blood work   At that time.    Immunization History  Administered Date(s) Administered  . H1N1 08/29/2008  . Influenza Whole 09/16/1997, 05/16/2012  . Influenza, High Dose Seasonal PF 06/16/2014  . Influenza,inj,Quad PF,6+ Mos 05/18/2013, 05/15/2015, 05/29/2016  . Influenza-Unspecified 05/20/2017  . Pneumococcal Conjugate-13 12/14/2013  . Pneumococcal-Unspecified 10/28/2000  . Td 09/16/2001, 12/14/2013  . Tetanus 12/14/2013  . Zoster 05/29/2016       Standley Brooking. Panosh M.D.

## 2017-07-30 ENCOUNTER — Ambulatory Visit (INDEPENDENT_AMBULATORY_CARE_PROVIDER_SITE_OTHER): Payer: Medicare Other | Admitting: Internal Medicine

## 2017-07-30 ENCOUNTER — Encounter: Payer: Self-pay | Admitting: Internal Medicine

## 2017-07-30 VITALS — BP 100/66 | HR 83 | Temp 98.1°F | Wt 163.6 lb

## 2017-07-30 DIAGNOSIS — Z853 Personal history of malignant neoplasm of breast: Secondary | ICD-10-CM | POA: Diagnosis not present

## 2017-07-30 DIAGNOSIS — M1380 Other specified arthritis, unspecified site: Secondary | ICD-10-CM | POA: Diagnosis not present

## 2017-07-30 DIAGNOSIS — Z79899 Other long term (current) drug therapy: Secondary | ICD-10-CM

## 2017-07-30 DIAGNOSIS — N289 Disorder of kidney and ureter, unspecified: Secondary | ICD-10-CM | POA: Diagnosis not present

## 2017-07-30 DIAGNOSIS — G4700 Insomnia, unspecified: Secondary | ICD-10-CM

## 2017-07-30 DIAGNOSIS — E785 Hyperlipidemia, unspecified: Secondary | ICD-10-CM | POA: Diagnosis not present

## 2017-07-30 LAB — BASIC METABOLIC PANEL
BUN: 27 mg/dL — AB (ref 6–23)
CHLORIDE: 103 meq/L (ref 96–112)
CO2: 30 mEq/L (ref 19–32)
Calcium: 9.9 mg/dL (ref 8.4–10.5)
Creatinine, Ser: 1.51 mg/dL — ABNORMAL HIGH (ref 0.40–1.20)
GFR: 35.08 mL/min — ABNORMAL LOW (ref >60.00)
Glucose, Bld: 83 mg/dL (ref 70–99)
POTASSIUM: 4.3 meq/L (ref 3.5–5.1)
Sodium: 141 mEq/L (ref 135–145)

## 2017-07-30 MED ORDER — FLUOCINONIDE-E 0.05 % EX CREA: 1.0000 "application " | TOPICAL_CREAM | Freq: Two times a day (BID) | CUTANEOUS | 0 refills | Status: DC

## 2017-07-30 MED ORDER — DICYCLOMINE HCL 20 MG PO TABS: ORAL_TABLET | ORAL | 5 refills | Status: DC

## 2017-07-30 NOTE — Patient Instructions (Addendum)
Please  Fu with rheumatology as  Discussed .   Ok to take the bentyl as needed  .    Perhaps 2-3 x per day may work as well .   caution with  ambien as discussed  Can continue with the  Mirtazapine  Due for  Check up  Yearly visit in march April with blood work   At that time.    Immunization History  Administered Date(s) Administered  . H1N1 08/29/2008  . Influenza Whole 09/16/1997, 05/16/2012  . Influenza, High Dose Seasonal PF 06/16/2014  . Influenza,inj,Quad PF,6+ Mos 05/18/2013, 05/15/2015, 05/29/2016  . Influenza-Unspecified 05/20/2017  . Pneumococcal Conjugate-13 12/14/2013  . Pneumococcal-Unspecified 10/28/2000  . Td 09/16/2001, 12/14/2013  . Tetanus 12/14/2013  . Zoster 05/29/2016

## 2017-08-06 DIAGNOSIS — M150 Primary generalized (osteo)arthritis: Secondary | ICD-10-CM | POA: Diagnosis not present

## 2017-08-06 DIAGNOSIS — M0609 Rheumatoid arthritis without rheumatoid factor, multiple sites: Secondary | ICD-10-CM | POA: Diagnosis not present

## 2017-08-06 DIAGNOSIS — M8580 Other specified disorders of bone density and structure, unspecified site: Secondary | ICD-10-CM | POA: Diagnosis not present

## 2017-08-22 ENCOUNTER — Other Ambulatory Visit: Payer: Self-pay | Admitting: Hematology

## 2017-08-22 DIAGNOSIS — F4322 Adjustment disorder with anxiety: Secondary | ICD-10-CM

## 2017-08-25 ENCOUNTER — Encounter: Payer: Self-pay | Admitting: Internal Medicine

## 2017-08-25 LAB — BREAST BIOPSY

## 2017-08-26 ENCOUNTER — Other Ambulatory Visit: Payer: Self-pay | Admitting: *Deleted

## 2017-08-26 DIAGNOSIS — F4322 Adjustment disorder with anxiety: Secondary | ICD-10-CM

## 2017-08-26 MED ORDER — SERTRALINE HCL 100 MG PO TABS: 100.0000 mg | ORAL_TABLET | Freq: Every day | ORAL | 0 refills | Status: DC

## 2017-10-03 ENCOUNTER — Telehealth: Payer: Self-pay | Admitting: *Deleted

## 2017-10-03 ENCOUNTER — Telehealth: Payer: Self-pay | Admitting: Hematology

## 2017-10-03 NOTE — Telephone Encounter (Signed)
Pt called asking for results of mammogram & bone density.  Able to view bone density & informed of osteopenia but do not see bone density.  Called [facility] for report of mammogram.  She will also call her PCP.

## 2017-10-03 NOTE — Telephone Encounter (Signed)
S/w pt, advised appt chgd from 2/4 to 2/21 @ 3pm. Pt aware.

## 2017-10-20 ENCOUNTER — Other Ambulatory Visit: Payer: Medicare Other

## 2017-10-20 ENCOUNTER — Ambulatory Visit: Payer: Medicare Other | Admitting: Hematology

## 2017-10-29 ENCOUNTER — Other Ambulatory Visit: Payer: Self-pay | Admitting: Internal Medicine

## 2017-10-29 NOTE — Telephone Encounter (Signed)
Pt dropped off a letter requesting a refill of her Ambien and Mirtazapine.  Requesting 90-day supply Requests Rx be faxed to [phone]  Last OV 07/30/17 Last filled Ambien 11/29/16, 90 x 0rf Last filled Mirtazapine 01/27/17, #180 x 1rf  Please advise Dr Regis Bill if okay to refill. Rxs will need to be printed, signed and faxed. Thanks.

## 2017-10-30 MED ORDER — ZOLPIDEM TARTRATE 10 MG PO TABS: ORAL_TABLET | ORAL | 0 refills | Status: DC

## 2017-10-30 MED ORDER — MIRTAZAPINE 15 MG PO TABS: ORAL_TABLET | ORAL | 1 refills | Status: DC

## 2017-10-30 NOTE — Telephone Encounter (Signed)
Printed and put on your desk 

## 2017-10-31 NOTE — Telephone Encounter (Signed)
Pt aware that Rxs have been faxed to number given.  Nothing further needed.

## 2017-11-04 NOTE — Progress Notes (Signed)
[facility] PROGRESS NOTE  Patient Care Team: Panosh, Standley Brooking, MD as PCP - General Dyke Maes, [place] (Optometry) Kary Kos, MD (Neurosurgery) [name], MD as Consulting Physician (General Surgery) Truitt Merle, MD as Consulting Physician (Hematology) Daryll Brod, MD as Consulting Physician (Orthopedic Surgery) Ladene Artist, MD as Consulting Physician (Gastroenterology) Warden Fillers, MD as Consulting Physician (Ophthalmology) Harriett Sine, MD as Consulting Physician (Dermatology)   Date of Service:  11/06/2017    CHIEF COMPLAINTS:  Follow up left breast DCIS    Oncology History   Diagnosed in 1995, s/p lumpectomy and 5 years Tamoxifen. Records not available      Ductal carcinoma in situ (DCIS) of left breast   1995 Cancer Diagnosis    History of left breast cancer, status post lumpectomy and 5 years tamoxifen.      06/08/2014 Initial Diagnosis    Cancer of left breast      06/08/2014 Initial Biopsy    Left breast biopsy showed DCIS.      07/01/2014 Surgery    Left breast lumpectomy, surgical margins were negative.      08/10/2014 -  Anti-estrogen oral therapy    She started adjuvant tamoxifen, and was subsequently switched to anastrozole a few months later due to her anti-depression medications Zoloft. Plan to comeplete in 07/2018        HISTORY OF PRESENTING ILLNESS:  Jeanne Tran 84 y.o. female is here because of recent diagnosis of left breast DCIS.   She is referred by Dr. Zella Richer because of a new diagnosis of left breast DCIS. She has a previous history of left breast cancer in 1995 and was treated with lumpectomy, axillary lymph node dissection, radiation, and 7 years tamoxifen. She has been followed by yearly mammogram. She was noted to have a new mass in the left breast on recent mammography at the 12 to 1:00 position, middle depth. Image guided biopsy was performed with the above pathology. The tumor is ER and PR  positive.   She was seen by Dr. Zella Richer. She declined mastectomy, underwent lumpectomy on 07/01/2014. The surgery went well without any complications. She has recovered well from the surgery, except mild fatigue after the surgery. She is able to function well at home. She denies any pain, dyspnea, abdominal discomfort, or any other symptoms.  In terms of breast cancer risk profile:  She menarched at early age of 69 and went to menopause at age mind 30's.   She had 3 pregnancy, her first child was born at age 48.   She did not breast-fed her child.  She did received birth control pills for several years.  She was on hormone replacement therapy for 12-13 years.  She has no family history of Breast/GYN/GI cancer  CURRENT THERAPY: Anastrozole 1 mg once daily, complete in late 07/2018.    INTERIM HISTORY:  Genine returns for follow-up of her left breast cancer. She was last seen by me 6 months ago.  Of note, since her last visit on 07/14/2017 she had a bone density scan which shows Osteopenia with lowest site at right femur neck with a T-Score of -1.50. She also had an abnormal mammogram on 07/14/17 which showed a 62mm lesion in her left breast. She had a US guided biopsy on 07/16/17 which showed the mass to be benign fat necrosis.     She presents to the clinic today noting she is doing well overall. She is still on anastrozole and tolerating well. She is  on Zoloft and tolerating the drug combination fine. She thinks Zoloft is working fine, she does not feel she is currently depressed. She reviewed her medication list. She has been on Mirtazapine for 3-4 years. She takes it with Ambien at night to help her sleep. She lives by herself and sees her children often who live in [place].  On review of symptoms, pt notes she still goes out and is active as much as she can be.     MEDICAL HISTORY:  Past Medical History:  Diagnosis Date  . Breast cancer (HCC)    hx of left with radiation and  lumpectomy  . Diverticulosis   . GERD (gastroesophageal reflux disease)   . History of colonic polyps   . History of ERCP    and sphincterotomy for abnormal lfts and dilated biliary ducts 5/05  . Hyperlipidemia   . Migraine headache   . Pulmonary nodule   . RLS (restless legs syndrome)   . Wears contact lenses     SURGICAL HISTORY: Past Surgical History:  Procedure Laterality Date  . ABDOMINAL HYSTERECTOMY     bso  . APPENDECTOMY    . BREAST LUMPECTOMY  1995   lt lumpsnbx  . BREAST LUMPECTOMY WITH RADIOACTIVE SEED LOCALIZATION Left 07/01/2014   Procedure: LEFT BREAST LUMPECTOMY WITH RADIOACTIVE SEED LOCALIZATION;  Surgeon: [name], MD;  Location: [facility];  Service: General;  Laterality: Left;  . CERVICAL SPINE SURGERY  3/03  . CHOLECYSTECTOMY  2012  . Complete Hysterectomy    . CYSTOCELE REPAIR    . ERCP     for abnormal lfts and dilated biliary ducts 5/05  . gallbladder duct open  2006  . HEMORRHOID SURGERY    . HYSTEROSCOPY    . LYMPH NODE BIOPSY    . Orthoscopic Rt Knee    . SPHINCTEROTOMY     for abnormal lfts and dilated biliary ducts 5/05  . tonsillectomy      SOCIAL HISTORY: History   Social History  . Marital Status: Widowed    Spouse Name: N/A    Number of Children: N/A  . Years of Education: N/A  She lives alone at home.   Occupational History  . Not on file.   Social History Main Topics  . Smoking status: Former Smoker -- 3.00 packs/day for 25 years    Types: Cigarettes    Quit date: 02/26/1994  . Smokeless tobacco: Never Used  . Alcohol Use: No  . Drug Use: No  . 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                    FAMILY HISTORY: No family history of malignancy.  ALLERGIES:  is allergic to asa [aspirin] and codeine.  MEDICATIONS:  Current Outpatient Medications  Medication Sig Dispense Refill    . anastrozole (ARIMIDEX) 1 MG tablet Take 1 tablet (1 mg total) by mouth daily. 90 tablet 1  . BIOTIN PO Take 2,000 mcg by mouth daily.    Marland Kitchen CALCIUM PO Take 1,000 mg by mouth.    . cetirizine (ZYRTEC) 10 MG tablet Take 10 mg by mouth daily.    . Cholecalciferol (VITAMIN D3) 2000 UNITS TABS Take 1 tablet by mouth daily.    Marland Kitchen dicyclomine (BENTYL)  20 MG tablet TAKE 1 TABLET BY MOUTH FOUR TIMES DAILY BEFORE MEALS AND AT BEDTIME (Patient taking differently: Take 20 mg by mouth 4 (four) times daily -  before meals and at bedtime. TAKE 1 TABLET BY MOUTH FOUR TIMES DAILY BEFORE MEALS AND AT BEDTIME) 120 tablet 5  . fish oil-omega-3 fatty acids 1000 MG capsule Take 1 g by mouth daily.     . fluocinonide-emollient (LIDEX-E) 0.05 % cream Apply 1 application 2 (two) times daily topically. As  Directed. 30 g 0  . mirtazapine (REMERON) 15 MG tablet take 1 tablet by mouth at bedtime CAN INCREASE TO 2 TABLETS AT BEDTIME FOR SLEEP 180 tablet 1  . Multiple Vitamin (MULTIVITAMIN) tablet Take 1 tablet by mouth daily.    . Probiotic Product ([employer]S PROBIOTIC) CHEW Chew 2 capsules by mouth daily.    . ranitidine (ZANTAC) 150 MG tablet Take 1 tablet (150 mg total) by mouth 2 (two) times daily. 180 tablet 3  . rosuvastatin (CRESTOR) 5 MG tablet Take 1 tablet (5 mg total) by mouth daily. 90 tablet 3  . sertraline (ZOLOFT) 100 MG tablet Take 1 tablet (100 mg total) by mouth daily. 90 tablet 0  . zolpidem (AMBIEN) 10 MG tablet take 1/2 tablet by mouth at bedtime AVOID DAILY USE MAY TAKE ADDITIONAL 1/2 tablets if needed 90 tablet 0   No current facility-administered medications for this visit.     REVIEW OF SYSTEMS:  Constitutional: Denies fevers, chills or abnormal night sweats Eyes: Denies blurriness of vision, double vision or watery eyes Ears, nose, mouth, throat, and face: Denies mucositis or sore throat Respiratory: Denies cough, dyspnea or wheezes Cardiovascular: Denies palpitation, chest discomfort or lower  extremity swelling Gastrointestinal:  Denies nausea, heartburn or change in bowel habits Skin: Denies abnormal skin rashes Lymphatics: Denies new lymphadenopathy or easy bruising Neurological:Denies numbness, tingling or new weaknesses Behavioral/Psych: Mood is stable, no new changes  All other systems were reviewed with the patient and are negative.  PHYSICAL EXAMINATION: ECOG PERFORMANCE STATUS: 0 - Asymptomatic  Vitals:   11/06/17 1503  BP: 134/68  Pulse: 73  Resp: 18  Temp: 98.2 F (36.8 C)  SpO2: 95%   Filed Weights   11/06/17 1503  Weight: 164 lb 3.2 oz (74.5 kg)     GENERAL:alert, no distress and comfortable SKIN: skin color, texture, turgor are normal, no rashes or significant lesions EYES: normal, conjunctiva are pink and non-injected, sclera clear OROPHARYNX:no exudate, no erythema and lips, buccal mucosa, and tongue normal  NECK: supple, thyroid normal size, non-tender, without nodularity LYMPH:  no palpable lymphadenopathy in the cervical, axillary or inguinal LUNGS: clear to auscultation and percussion with normal breathing effort HEART: regular rate & rhythm and no murmurs and no lower extremity edema ABDOMEN:abdomen soft, non-tender and normal bowel sounds Musculoskeletal:no cyanosis of digits and no clubbing  PSYCH: alert & oriented x 3 with fluent speech NEURO: no focal motor/sensory deficits Breasts: S/p left lumpectomy: Her left breast is smaller than right. (+) Surgical incision healed well, mild scarring. (+) She has palpable 37mm nodule next to her incision in the 2:00 position of the left breast, very firm. Found to be fat necrosis on biopsy. (+) No other palpable mass or adenopathy in either breast or axilla.    LABORATORY DATA:  I have reviewed the data as listed  CBC Latest Ref Rng & Units 11/06/2017 04/17/2017 10/01/2016  WBC 3.9 - 10.3 K/uL 6.6 7.4 5.8  Hemoglobin 11.6 - 15.9 g/dL 12.6 13.2  12.9  Hematocrit 34.8 - 46.6 % 38.0 39.5 38.7  Platelets  145 - 400 K/uL 156 180 187    CMP Latest Ref Rng & Units 11/06/2017 07/30/2017 04/17/2017  Glucose 70 - 140 mg/dL 100 83 91  BUN 7 - 26 mg/dL 26 27(H) 27.3(H)  Creatinine 0.60 - 1.10 mg/dL 1.50(H) 1.51(H) 1.6(H)  Sodium 136 - 145 mmol/L 143 141 141  Potassium 3.5 - 5.1 mmol/L 4.3 4.3 5.0  Chloride 98 - 109 mmol/L 105 103 -  CO2 22 - 29 mmol/L 28 30 28   Calcium 8.4 - 10.4 mg/dL 10.0 9.9 10.1  Total Protein 6.4 - 8.3 g/dL 6.8 - 7.1  Total Bilirubin 0.2 - 1.2 mg/dL 0.4 - 0.25  Alkaline Phos 40 - 150 U/L 113 - 118  AST 5 - 34 U/L 26 - 22  ALT 0 - 55 U/L 19 - 15   PATHOLOGY  Diagnosis 06/28/17 Breast, left, needle core biopsy, 1:00 o'clock - FAT NECROSIS. - THERE IS NO EVIDENCE OF MALIGNANCY. - SEE COMMENT. Microscopic Comment The results were called to [facility] on 07/17/17. (JBK:gt, 07/17/17)   Pathology report 07/01/2014 Diagnosis 1. Breast, lumpectomy, left - DUCTAL CARCINOMA IN SITU WITH CALCIFICATIONS, LOW GRADE, SPANNING 1.8 CM. - THE SURGICAL RESECTION MARGINS ARE NEGATIVE FOR CARCINOMA. - SEE ONCOLOGY TABLE BELOW. 2. Breast, excision, medial margin left - FIBROCYSTIC CHANGES. - THERE IS NO EVIDENCE OF MALIGNANCY. - SEE COMMENT. 3. Breast, excision, superior margin left - FIBROCYSTIC CHANGES. - THERE IS NO EVIDENCE OF MALIGNANCY. - SEE COMMENT. 4. Breast, excision, lateral margin left - FIBROCYSTIC CHANGES. - THERE IS NO EVIDENCE OF MALIGNANCY. - SEE COMMENT. 5. Breast, excision, inferior margin left - FIBROCYSTIC CHANGES. - THERE IS NO EVIDENCE OF MALIGNANCY. - SEE COMMENT. Microscopic Comment 1. BREAST, IN SITU CARCINOMA Specimen, including laterality: Left breast Procedure (include lymph node sampling sentinel-non-sentinel: Lumpectomy and additional margin resection x 4 Grade of carcinoma: Low grade Necrosis: Not identified. Estimated tumor size: (gross measurement): 1.8 cm Treatment effect: N/A Distance to closest margin: Greater than 0.2 cm to  all margins Breast prognostic profile: [phone] 1 of 3 FINAL for KIMBELLA, HEISLER ([id number]) Microscopic Comment(continued) Estrogen receptor: 100%, strong staining intensity Progesterone receptor: 100%, strong staining intensity TNM: pTis, pNX (JK:kh 07-04-14) 2. -5. The surgical resection  RADIOGRAPHIC STUDIES:  Breast US Left 07/14/17 IMPRESSION:  The 7 mm lesion in the left breast is at a low suspicion for malignancy. An ultrasound guided biopsy is recommended.   Bone Density from [facility] 07/14/17    Bone density scan 11/15/2014 FINDINGS: AP LUMBAR SPINE L1-L4 Bone Mineral Density (BMD): 0.984 g/cm2 Young Adult T-Score: -0.6 Z-Score: 2.1 Left FEMUR neck Bone Mineral Density (BMD): 0.700 g/cm2 Young Adult T-Score: -1.3 Z-Score: 0.9 ASSESSMENT: Patient's diagnostic category is LOW BONE MASS by WHO Criteria.  ASSESSMENT: 84 y.o. female, with left breast DCIS s/p lumpectomy, ER/PR positive  PLAN:  #1 left breast DCIS, ER/PR positive -The patient had early stage disease. She is considered to be cured by surgery alone. Any form of adjuvant treatment is for prevention of disease recurrence.  -Due to her prior left breast radiation history, no adjuvant radiation at this time -I recommended adjuvant antiestrogen therapy to reduce risk of breast cancer in the future. She started Tamoxifen in 06/2014 -She tolerated tamoxifen quite well, but she was not happy with her Zoloft and was previously switched to Effexor.  -So tamoxifen was switched to anastrozole to prevent drug interaction. She is tolerating it  well, plan for total 5 years. -She had an abnormal breast US on 07/14/17 which showed a 20mm lesion in her left breast. She had a US guided biopsy on 07/16/17 which showed the mass to be benign fat necrosis. She is to repeat mammogram 6 months later.  -I will obtain 07/14/17 mammogram from [facility] for our records.  -She is clinically doing well. Lab reviewed,  her CBC and CMP are within normal limits. Her physical exam was unremarkable with palpable fat necrosis in the 2:00 position of left breast. There is no clinical concern for recurrence. -She is tolerating Anastrozole well. Will continue until late November 2019. Refilled today.  -F/u in 7-8 months    #2 osteopenia - I recommend she continue taking calcium 1 g a day and vitamin D. -Bone density scan on 11/15/2014 showed osteopenia. -I previously discussed the option of adding bisphosphonate, she really doesn't want take additional medication. -I discussed anastrozole may potentially make her osteopenia worse.  -07/14/2017 Bone density scan shows Osteopenia with lowest site at right femur neck with a T-Score of -1.50. Some areas have worsened while others have improved, overall stable.  -I encouraged her to prevent risk for fall due to her increased risk of fracture. I suggest she start using a cane.    #3 depression -She takes Ambien and Mirtazapine for sleep. -Mood well controlled on Zoloft.   Plan -Continue Anastrozole until Nov 2019, refilled today  -Lab and f/u in 7-8 months  -Mammogram in 06/2018   All questions were answered. The patient knows to call the clinic with any problems, questions or concerns. I spent 15 minutes counseling the patient face to face. The total time spent in the appointment was 20 minutes and more than 50% was on counseling    Truitt Merle, MD 11/06/2017   This document serves as a record of services personally performed by Truitt Merle, MD. It was created on her behalf by Joslyn Devon, a trained medical scribe. The creation of this record is based on the scribe's personal observations and the provider's statements to them.    I have reviewed the above documentation for accuracy and completeness, and I agree with the above.

## 2017-11-06 ENCOUNTER — Inpatient Hospital Stay (HOSPITAL_BASED_OUTPATIENT_CLINIC_OR_DEPARTMENT_OTHER): Payer: Medicare Other | Admitting: Hematology

## 2017-11-06 ENCOUNTER — Inpatient Hospital Stay: Payer: Medicare Other | Attending: Hematology

## 2017-11-06 ENCOUNTER — Telehealth: Payer: Self-pay | Admitting: Hematology

## 2017-11-06 ENCOUNTER — Encounter: Payer: Self-pay | Admitting: Hematology

## 2017-11-06 VITALS — BP 134/68 | HR 73 | Temp 98.2°F | Resp 18 | Ht 64.5 in | Wt 164.2 lb

## 2017-11-06 DIAGNOSIS — M8580 Other specified disorders of bone density and structure, unspecified site: Secondary | ICD-10-CM | POA: Diagnosis not present

## 2017-11-06 DIAGNOSIS — D0512 Intraductal carcinoma in situ of left breast: Secondary | ICD-10-CM | POA: Insufficient documentation

## 2017-11-06 DIAGNOSIS — E2839 Other primary ovarian failure: Secondary | ICD-10-CM

## 2017-11-06 DIAGNOSIS — F329 Major depressive disorder, single episode, unspecified: Secondary | ICD-10-CM | POA: Diagnosis not present

## 2017-11-06 DIAGNOSIS — F4322 Adjustment disorder with anxiety: Secondary | ICD-10-CM

## 2017-11-06 LAB — COMPREHENSIVE METABOLIC PANEL
ALT: 19 U/L (ref 0–55)
AST: 26 U/L (ref 5–34)
Albumin: 4 g/dL (ref 3.5–5.0)
Alkaline Phosphatase: 113 U/L (ref 40–150)
Anion gap: 10 (ref 3–11)
BUN: 26 mg/dL (ref 7–26)
CALCIUM: 10 mg/dL (ref 8.4–10.4)
CHLORIDE: 105 mmol/L (ref 98–109)
CO2: 28 mmol/L (ref 22–29)
Creatinine, Ser: 1.5 mg/dL — ABNORMAL HIGH (ref 0.60–1.10)
GFR calc non Af Amer: 31 mL/min — ABNORMAL LOW (ref >60)
GFR, EST AFRICAN AMERICAN: 36 mL/min — AB (ref >60)
Glucose, Bld: 100 mg/dL (ref 70–140)
POTASSIUM: 4.3 mmol/L (ref 3.5–5.1)
SODIUM: 143 mmol/L (ref 136–145)
Total Bilirubin: 0.4 mg/dL (ref 0.2–1.2)
Total Protein: 6.8 g/dL (ref 6.4–8.3)

## 2017-11-06 LAB — CBC WITH DIFFERENTIAL/PLATELET
Basophils Absolute: 0.1 10*3/uL (ref 0.0–0.1)
Basophils Relative: 1 %
Eosinophils Absolute: 0.3 10*3/uL (ref 0.0–0.5)
Eosinophils Relative: 5 %
HCT: 38 % (ref 34.8–46.6)
Hemoglobin: 12.6 g/dL (ref 11.6–15.9)
Lymphocytes Relative: 22 %
Lymphs Abs: 1.5 10*3/uL (ref 0.9–3.3)
MCH: 31.7 pg (ref 25.1–34.0)
MCHC: 33.2 g/dL (ref 31.5–36.0)
MCV: 95.5 fL (ref 79.5–101.0)
Monocytes Absolute: 0.7 10*3/uL (ref 0.1–0.9)
Monocytes Relative: 11 %
Neutro Abs: 4.1 10*3/uL (ref 1.5–6.5)
Neutrophils Relative %: 61 %
Platelets: 156 10*3/uL (ref 145–400)
RBC: 3.98 MIL/uL (ref 3.70–5.45)
RDW: 13.6 % (ref 11.2–14.5)
WBC: 6.6 10*3/uL (ref 3.9–10.3)
nRBC: 0 /100{WBCs}

## 2017-11-06 MED ORDER — ANASTROZOLE 1 MG PO TABS: 1.0000 mg | ORAL_TABLET | Freq: Every day | ORAL | 1 refills | Status: DC

## 2017-11-06 NOTE — Telephone Encounter (Signed)
Scheduled appt per 2/21 los - Gave patient AVS and calender per los.  

## 2017-11-08 ENCOUNTER — Encounter: Payer: Self-pay | Admitting: Hematology

## 2017-11-10 ENCOUNTER — Telehealth: Payer: Self-pay | Admitting: Internal Medicine

## 2017-11-10 ENCOUNTER — Telehealth: Payer: Self-pay | Admitting: *Deleted

## 2017-11-10 NOTE — Telephone Encounter (Signed)
Prior auth for Dicyclomine 20mg  tablets sent to [email or web address].

## 2017-11-10 NOTE — Telephone Encounter (Signed)
Copied from [place] [phone]. Topic: Quick Communication - See Telephone Encounter >> Nov 10, 2017  4:39 PM Arletha Grippe wrote: CRM for notification. See Telephone encounter for:   11/10/17. Calling to get clarification on dicyclomine (BENTYL) 20 MG tablet    Prior auth - they need to verify if pt has IBS. Waunita Schooner from [facility] -  [phone]  Option 2

## 2017-11-11 NOTE — Telephone Encounter (Signed)
Note in [email or web address] state the Rx is approved. PA Case: [id number], Status: Approved, Coverage Starts on: 11/11/2017 12:00:00 AM, Coverage Ends on: 11/11/2018 12:00:00 AM and I called [facility] and informed Marjory Lies of this.

## 2017-11-11 NOTE — Telephone Encounter (Signed)
I called the number below and per Jackquline Berlin is in a meeting.  I asked that she let Waunita Schooner know the pt does have a diagnosis of IBS and she agreed.

## 2017-11-17 ENCOUNTER — Other Ambulatory Visit: Payer: Self-pay | Admitting: *Deleted

## 2017-11-17 DIAGNOSIS — F4322 Adjustment disorder with anxiety: Secondary | ICD-10-CM

## 2017-11-17 MED ORDER — SERTRALINE HCL 100 MG PO TABS: 100.0000 mg | ORAL_TABLET | Freq: Every day | ORAL | 0 refills | Status: DC

## 2017-11-19 ENCOUNTER — Telehealth: Payer: Self-pay | Admitting: *Deleted

## 2017-11-19 NOTE — Telephone Encounter (Signed)
Patient walked into office requesting assistance with her Ambien rx. She has been unable to obtain the rx that was filled last month. She talked with the pharmacy and states we must call [facility] [phone] and ask for "clinical staff" for them to certify release of 90 Ambien 10 mg tablets as prescribed.   I called the number above and what was actually needed was prior auth. I initiated this over the phone and determination will be faxed to [phone] within the hour.

## 2017-11-19 NOTE — Telephone Encounter (Signed)
PA approval received via fax. PA approved through 11/19/2018. Called patient to notify her, no answer and no voicemail.

## 2017-11-21 ENCOUNTER — Other Ambulatory Visit: Payer: Self-pay | Admitting: Internal Medicine

## 2017-11-21 MED ORDER — ZOLPIDEM TARTRATE 10 MG PO TABS: ORAL_TABLET | ORAL | 0 refills | Status: DC

## 2017-11-21 MED ORDER — AMBIEN 10 MG PO TABS: ORAL_TABLET | ORAL | 0 refills | Status: DC

## 2017-11-21 NOTE — Telephone Encounter (Signed)
Pt never got Rx that was sent to [facility] 10/30/17 They only have Generic form  Pt needs BRAND NAME.  They advised that she get a local Rx for the brand name, 90-day, as they do not have it available.   Pt states that she only needs 30 day at this time so she can see how expensive its going to be.  Please advise Dr Regis Bill, thanks.

## 2017-11-21 NOTE — Telephone Encounter (Signed)
Rx okayed per Dr Regis Bill. Rx given to patient. Nothing further needed.

## 2017-11-21 NOTE — Telephone Encounter (Signed)
Patient needs a refill of Ambien 10 mg 3 month supply printed out to take to a local pharmacy.  Patient is requesting a print out.  Patient is completely out of her prescription.

## 2017-11-21 NOTE — Telephone Encounter (Signed)
Last filled 10/30/17 #90 x 0rf Pt in lobby waiting for printed Rx.  Requesting 90 DAY supply  Please advise Dr Regis Bill, thanks.

## 2017-12-17 ENCOUNTER — Other Ambulatory Visit: Payer: Self-pay | Admitting: *Deleted

## 2017-12-17 NOTE — Telephone Encounter (Signed)
Patient in lobby to discuss Ambien refill.  Pt states that she has changed her pharmacy that she wants this filled to -- [facility] Pt requests a 90-day supply of Ambien be filled DAW

## 2017-12-18 NOTE — Telephone Encounter (Signed)
See previous notes  encoutnter   3/6 20-19 Please make sure other refill has been cancelled  And then can send this one to  Her new pharmacy .    Thanks [facility]

## 2017-12-22 MED ORDER — AMBIEN 10 MG PO TABS: ORAL_TABLET | ORAL | 0 refills | Status: DC

## 2017-12-22 NOTE — Telephone Encounter (Signed)
Pt came by our office and dropped off a handwritten letter stating that her Ambien 10mg  needed to be mailed on bc it was controlled -- cannot be phoned in, faxed or e-scribed. I called [facility] to clarify as this did not make sense.  Per the pharmacist at [facility] only scheduled 2 narcotics have to be mailed in ( oxycodone, morphine...) They do allow Ambien to be phoned in so this was taken care of.   There are no other refills at her local pharmacy - she filled this Rx last month on 11/21/17 at the local pharmacy. Nothing further needed.

## 2017-12-29 ENCOUNTER — Telehealth: Payer: Self-pay | Admitting: Internal Medicine

## 2017-12-29 NOTE — Telephone Encounter (Signed)
Copied from [place] [phone]. Topic: Quick Communication - Rx Refill/Question >> Dec 29, 2017  3:33 PM Arletha Grippe wrote: Medication: rosuvastatin (CRESTOR) 5 MG tablet and ranitidine (ZANTAC) 150 MG tablet    Has the patient contacted their pharmacy? Yes.   (Agent: If no, request that the patient contact the pharmacy for the refill.) Preferred Pharmacy (with phone number or street name): [facility] fax is [phone] Agent: Please be advised that RX refills may take up to 3 business days. We ask that you follow-up with your pharmacy.

## 2017-12-30 ENCOUNTER — Other Ambulatory Visit: Payer: Self-pay | Admitting: *Deleted

## 2017-12-30 MED ORDER — RANITIDINE HCL 150 MG PO TABS: 150.0000 mg | ORAL_TABLET | Freq: Two times a day (BID) | ORAL | 0 refills | Status: DC

## 2017-12-30 MED ORDER — ROSUVASTATIN CALCIUM 5 MG PO TABS: 5.0000 mg | ORAL_TABLET | Freq: Every day | ORAL | 0 refills | Status: DC

## 2017-12-30 NOTE — Telephone Encounter (Signed)
Refilled per protocol with no additional- note on Rx- appointment due- patient is due her appointment now.

## 2018-01-06 ENCOUNTER — Telehealth: Payer: Self-pay | Admitting: Internal Medicine

## 2018-01-06 NOTE — Telephone Encounter (Signed)
Copied from [place] [phone]. Topic: Quick Communication - Rx Refill/Question >> Jan 06, 2018 12:15 PM Tye [place] wrote: Medication: AMBIEN 10 MG tablet [[id number]]  Has the patient contacted their pharmacy? Yes.    Pt states [facility] called and they are needing additional information concerning the medication from the pcp in order for them to fill it, contact pt or pharmacy if needed

## 2018-01-06 NOTE — Telephone Encounter (Signed)
Ambien 10 mg tablet - pharmacy needing additional information concerning the medication from the PCP in order for them to fill it, contact pt or pharmacy if needed. Pt of Dr. Regis Bill  See attached

## 2018-01-08 NOTE — Telephone Encounter (Signed)
PA was approved on 11/18/2017 for Zolpidem #90 Pt called back in and requested name brand ONLY AMBIEN  This now requires PA for AMBIEN ([place]) #90  PA # [phone], opt 1  Pt states that she cannot take the Zolpidem, did not benefit her at all.  Needs Brand name only.  Called the PA Line, initiated PA for AMBIEN (DAW) 10mg  , #90 for 90 days Spoke with Junie Panning with [facility] G47.00 Chronic Insomnia  Pt has tried Zolpidem with no benefit.  Remeron 15mg  is non-formulary so does not count as a "failed medication" Must try Zolpidem, Lunesta, Belsomra, Rozerem and Silenor If contraindication with any of the "preferred meds" listed above then [facility] will be approved, otherwise must try and fail atleast ONE of the above since she has already failed Zolpidem.    Please advise Dr Regis Bill, thanks.

## 2018-01-09 ENCOUNTER — Telehealth: Payer: Self-pay | Admitting: Internal Medicine

## 2018-01-09 MED ORDER — AMBIEN 10 MG PO TABS: ORAL_TABLET | ORAL | 0 refills | Status: DC

## 2018-01-09 NOTE — Telephone Encounter (Signed)
Rx has been sent to [facility] for Brand Name Ambien 10mg .  Nothing further needed.

## 2018-01-09 NOTE — Telephone Encounter (Signed)
Noted  

## 2018-01-09 NOTE — Telephone Encounter (Signed)
Copied from [place]. Topic: Quick Communication - See Telephone Encounter >> Jan 09, 2018 12:00 PM Conception Chancy, NT wrote: CRM for notification. See Telephone encounter for: 01/09/18.  Jeanne Tran is calling from Medicare Part D plan to check on the prior authorization for AMBIEN 10 MG tablet. Please contact.  Cb# [phone] opt 2

## 2018-01-09 NOTE — Telephone Encounter (Signed)
She could try  lunesta  1 mg   Take 1 po as needed hs for sleep.  Disp 30   No refill  Tell her this is similar to [place]  And if needed we could try 2 mg   \ROV    After  Trial of medication

## 2018-01-09 NOTE — Telephone Encounter (Signed)
Pt has tried and failed Lunesta years before trying [place] again to give more clinical information.  Spoke with Waunita Schooner. Pt has tried and failed Generic Zolpidem, Lunesta and (non-preferred) Remeron.   AMBIEN (DAW) 10mg  approved through 01/10/2019  Can be sent to the pharmacy within the next 1 hour.   Pt aware this will be sent to the pharmacy this afternoon.   Will hold in my box to follow up.

## 2018-01-09 NOTE — Telephone Encounter (Signed)
This has been approved through 2020 for AMBIEN (DAW) 10mg .  Nothing further needed.

## 2018-01-19 ENCOUNTER — Telehealth: Payer: Self-pay

## 2018-01-19 ENCOUNTER — Other Ambulatory Visit: Payer: Self-pay

## 2018-01-19 DIAGNOSIS — D0512 Intraductal carcinoma in situ of left breast: Secondary | ICD-10-CM

## 2018-01-19 DIAGNOSIS — M8580 Other specified disorders of bone density and structure, unspecified site: Secondary | ICD-10-CM

## 2018-01-19 DIAGNOSIS — E2839 Other primary ovarian failure: Secondary | ICD-10-CM

## 2018-01-19 DIAGNOSIS — F4322 Adjustment disorder with anxiety: Secondary | ICD-10-CM

## 2018-01-19 MED ORDER — ANASTROZOLE 1 MG PO TABS: 1.0000 mg | ORAL_TABLET | Freq: Every day | ORAL | 1 refills | Status: DC

## 2018-01-19 NOTE — Telephone Encounter (Signed)
Patient called for refill Arimidex, faxed to pharmacy.

## 2018-01-20 ENCOUNTER — Other Ambulatory Visit: Payer: Self-pay | Admitting: Medical Oncology

## 2018-01-20 NOTE — Progress Notes (Signed)
Pt notified that Anastrazole was faxed to mail order pharmacy

## 2018-01-21 ENCOUNTER — Telehealth: Payer: Self-pay | Admitting: Internal Medicine

## 2018-01-21 NOTE — Telephone Encounter (Signed)
Ambien was just filled on 01/09/18, #90 to [facility] Pt aware of this and did not recall having [place] send this to [facility]  Pt is going to go pick this up today.  Nothing further needed.

## 2018-01-21 NOTE — Telephone Encounter (Signed)
Copied from [place] [phone]. Topic: Quick Communication - See Telephone Encounter >> Jan 21, 2018  2:42 PM Synthia Innocent wrote: CRM for notification. See Telephone encounter for: 01/21/18.Need approval to fill AMBIEN 10 MG tablet early. Please advise

## 2018-01-22 ENCOUNTER — Telehealth: Payer: Self-pay | Admitting: Family Medicine

## 2018-01-22 NOTE — Telephone Encounter (Signed)
Copied from [place] [phone]. Topic: General - Other >> Jan 22, 2018 12:06 PM Yvette Rack wrote: Reason for CRM: patient states that she need someone to call [facility] [place], [place] [phone] (Phone) [phone] (Fax)  to see if she need a prior authorization on Ambien pt is fed up at not being able to get medicine filled

## 2018-01-23 NOTE — Telephone Encounter (Signed)
I called the pt and left a detailed message stating Jeanne Tran obtained a prior Josem Kaufmann , she sent the medication in to [facility] on 4/26, #90 pills and to contact the pharmacy.

## 2018-01-26 ENCOUNTER — Telehealth: Payer: Self-pay | Admitting: Internal Medicine

## 2018-01-26 NOTE — Telephone Encounter (Signed)
Called and LM for [name] at [place]  Trying to figure out how they were able to fill and ship the generic form of the Ambien when we were advised that it was not longer covered which is why we did the PA for BRAND NAME AMBIEN(DAW) in April 2019.  Seems like every month the refill is due there is a new issue with the refill request.

## 2018-01-26 NOTE — Telephone Encounter (Signed)
Copied from [place] [id number]. Topic: Quick Communication - See Telephone Encounter >> Jan 26, 2018  9:32 AM Rutherford Nail, NT wrote: CRM for notification. See Telephone encounter for: 01/26/18. [name] from [facility] [employer], calling and states that a new Ambien prescription was sent in on 12/22/17. They shipped the generic version of this medication on 12/04/17. Is it ok to go ahead and fill this medication? CB#: [phone]

## 2018-01-27 ENCOUNTER — Telehealth: Payer: Self-pay | Admitting: Family Medicine

## 2018-01-27 NOTE — Telephone Encounter (Signed)
Will send to Dr Regis Bill to advise See previous telephone note

## 2018-01-27 NOTE — Telephone Encounter (Signed)
Spoke with [name] states that they shipped out the Generic Ambien to the patient in March so she is not due for a refill for another month. Pt had requested brand name Ambien as she cannot tolerate the generic (does not work for sx's). Pt has received the generic form of the medication because there was one refill left on the March Rx and the PA for brand name was done after the generic Rx was sent to [facility]. Pt is requesting that she get the Brand name. This would be an early fill of about 1 month early if they dispense the Brand name Ambien to the patient -- patient has 1 month supply at home of generic. Please advise Dr Regis Bill if you recommend patient take the 1 month supply she has at home then filling the Brand name when a refill is due or do you okay an early fill?  Please advise Dr Regis Bill, thanks.

## 2018-01-27 NOTE — Telephone Encounter (Signed)
Tell patient we can   Have them give her brand that we had ordered     Disp 30    .  Tell her to not use any automatic refills on  Mail away pharmacies because  [place] med back if   Not correct .

## 2018-01-27 NOTE — Telephone Encounter (Signed)
[name] calling back, unable to take calls after 3

## 2018-01-27 NOTE — Telephone Encounter (Signed)
Copied from [place] [phone]. Topic: General - Other >> Jan 27, 2018  2:37 PM Yvette Rack wrote: Reason for CRM: Pt requested to speak with Miquel Dunn. Pt states that she will not take the generic Ambien nor has she taken the generic Ambien. Pt is adamant that she will not take the generic. Pt requests that this message gets to [place].

## 2018-01-27 NOTE — Telephone Encounter (Signed)
LM for [facility] at [facility]

## 2018-01-28 NOTE — Telephone Encounter (Signed)
LM for patient regarding Ambien.  Can send in [place] to pharmacy of choice.

## 2018-01-28 NOTE — Telephone Encounter (Signed)
Pt calling back and states she would the [facility] [id number] [place], [place] [phone] (Phone) [phone] (Fax)

## 2018-01-29 NOTE — Telephone Encounter (Signed)
Discussed with Dr Regis Bill  We cannot continue changing pharmacies every refill of this medication given it is a controlled medication.  There has been too much confusion these last few months with the Ambien refills.  We need to stick with one pharmacy for monthly refills.   Left message with patient to see why she is wanting to switch the pharmacy again.

## 2018-02-03 NOTE — Telephone Encounter (Deleted)
[employer] Clear[employer] letter is in your red folder to be signed. Pt is wanting to continue her [employer] but they are requiring a letter to clear.

## 2018-02-05 NOTE — Telephone Encounter (Signed)
Noted  

## 2018-02-05 NOTE — Telephone Encounter (Signed)
Patient states that she missed a call about her medication and said she [facility] but is aware of the message from [place]. She said if it will be a problem, she will stick with [facility] for next time. I advised her that she was given a prescription for ambien for 90 tablets on 4/26 that she never picked up. She said [facility] can [facility] it [facility].

## 2018-02-10 NOTE — Telephone Encounter (Signed)
Ok to send in early refill of BRAND only

## 2018-02-11 NOTE — Telephone Encounter (Signed)
Left detailed message on Jeanne Tran's voicemail at [facility] making aware of okay to fill BRAND Ambien.  Also requested that a note be placed on her chart stating that Generic is NOT to be refilled in the future.   Called patient to make aware of this - pt aware via detailed voicemail and also also aware that we will continue filling at [facility] (she had previously asked to have her pharmacy switched again in another TE and we advised her that we needed to stick with ONE pharmacy and not flip flop back and forth, she was okay with staying with [facility] as the Rx is already there waiting to be dispensed)  Pt also advised that if she is not going to take the Ambien generic then she will need to take it her local pharmacy to have them dispose of it or they will direct her to the best place to have it disposed of.   Nothing further needed.

## 2018-02-17 ENCOUNTER — Encounter: Payer: Self-pay | Admitting: Internal Medicine

## 2018-02-17 DIAGNOSIS — R928 Other abnormal and inconclusive findings on diagnostic imaging of breast: Secondary | ICD-10-CM | POA: Diagnosis not present

## 2018-02-17 DIAGNOSIS — Z853 Personal history of malignant neoplasm of breast: Secondary | ICD-10-CM | POA: Diagnosis not present

## 2018-02-23 ENCOUNTER — Other Ambulatory Visit: Payer: Self-pay | Admitting: Hematology

## 2018-02-23 ENCOUNTER — Telehealth: Payer: Self-pay | Admitting: *Deleted

## 2018-02-23 DIAGNOSIS — F4322 Adjustment disorder with anxiety: Secondary | ICD-10-CM

## 2018-02-23 MED ORDER — SERTRALINE HCL 100 MG PO TABS: 100.0000 mg | ORAL_TABLET | Freq: Every day | ORAL | 1 refills | Status: DC

## 2018-02-23 NOTE — Telephone Encounter (Signed)
I have refilled, thanks   Truitt Merle MD

## 2018-02-23 NOTE — Telephone Encounter (Signed)
Received call from pt asking for refill on her sertraline to [facility].  She has a 10 d suppy left & would like to have a 90 day supply with refills.  Message to Dr Burr Medico.

## 2018-02-25 ENCOUNTER — Ambulatory Visit (INDEPENDENT_AMBULATORY_CARE_PROVIDER_SITE_OTHER): Payer: Medicare Other | Admitting: Internal Medicine

## 2018-02-25 ENCOUNTER — Encounter: Payer: Self-pay | Admitting: Internal Medicine

## 2018-02-25 VITALS — BP 111/75 | HR 81 | Temp 98.2°F | Wt 159.0 lb

## 2018-02-25 DIAGNOSIS — M159 Polyosteoarthritis, unspecified: Secondary | ICD-10-CM

## 2018-02-25 DIAGNOSIS — N189 Chronic kidney disease, unspecified: Secondary | ICD-10-CM | POA: Diagnosis not present

## 2018-02-25 DIAGNOSIS — M2550 Pain in unspecified joint: Secondary | ICD-10-CM

## 2018-02-25 DIAGNOSIS — G4700 Insomnia, unspecified: Secondary | ICD-10-CM

## 2018-02-25 DIAGNOSIS — D0512 Intraductal carcinoma in situ of left breast: Secondary | ICD-10-CM

## 2018-02-25 DIAGNOSIS — E785 Hyperlipidemia, unspecified: Secondary | ICD-10-CM

## 2018-02-25 DIAGNOSIS — Z79899 Other long term (current) drug therapy: Secondary | ICD-10-CM | POA: Diagnosis not present

## 2018-02-25 LAB — BASIC METABOLIC PANEL
BUN: 29 mg/dL — AB (ref 6–23)
CALCIUM: 9.6 mg/dL (ref 8.4–10.5)
CO2: 29 mEq/L (ref 19–32)
CREATININE: 1.61 mg/dL — AB (ref 0.40–1.20)
Chloride: 103 mEq/L (ref 96–112)
GFR: 32.53 mL/min — AB (ref >60.00)
GLUCOSE: 91 mg/dL (ref 70–99)
POTASSIUM: 4.3 meq/L (ref 3.5–5.1)
Sodium: 140 mEq/L (ref 135–145)

## 2018-02-25 LAB — HEMOGLOBIN A1C: HEMOGLOBIN A1C: 6.2 % (ref 4.6–6.5)

## 2018-02-25 LAB — LIPID PANEL
CHOL/HDL RATIO: 2
CHOLESTEROL: 154 mg/dL (ref 0–200)
HDL: 63.8 mg/dL (ref >39.00)
LDL CALC: 61 mg/dL (ref 0–99)
NonHDL: 90.13
TRIGLYCERIDES: 146 mg/dL (ref 0.0–149.0)
VLDL: 29.2 mg/dL (ref 0.0–40.0)

## 2018-02-25 MED ORDER — ZOSTER VAC RECOMB ADJUVANTED 50 MCG/0.5ML IM SUSR: 0.5000 mL | Freq: Once | INTRAMUSCULAR | 1 refills | Status: AC

## 2018-02-25 NOTE — Patient Instructions (Addendum)
Glad  You are doing well.   Lab today .   Cholesterol and  Kidney function .   Caution with  the ambien as  Usual.    ROV in 6 months or as needed   Can get shingles vaccine at your pharmacy.

## 2018-02-25 NOTE — Progress Notes (Signed)
Chief Complaint  Patient presents with  . Medication Management  . Hyperlipidemia    HPI: Jeanne Tran 84 y.o. comes in today for  .6 mos yearly check  has HYPERLIPIDEMIA; INSOMNIA, CHRONIC; ADJUSTMENT DISORDER WITH ANXIOUS MOOD; ESOPHAGEAL STRICTURE; GERD; DIVERTICULOSIS OF COLON; IRRITABLE BOWEL SYNDROME; Osteoarthritis; HIP PAIN, RIGHT; NECK PAIN; BACK PAIN, RIGHT; TINGLING; HEADACHE; FECAL OCCULT BLOOD; BREAST CANCER, HX OF; COLONIC POLYPS, HX OF; History of ERCP; High risk medication use; Medicare annual wellness visit, subsequent; Abnormal TSH; Dry skin dermatitis; Elevated serum creatinine; Medication management; Chronic renal insufficiency; Swelling of both hands; Multiple joint pain; GI symptoms; Hyperlipidemia; Ductal carcinoma in situ (DCIS) of left breast; and Osteopenia on their problem list.  Insomnia    On brand ambien  No se aware of risks we have tried other meds    Mod good  HLD no   problem with meds Arthritis  went back to  [facility] and different   Said not RA and oa   Right hand finger contractures to recheck prn   Id getting worse  Still function al r hand  How much tylenol can she take in a day?  Health Maintenance  Topic Date Due  . INFLUENZA VACCINE  04/16/2018  . TETANUS/TDAP  12/15/2023  . DEXA SCAN  Completed  . PNA vac Low Risk Adult  Completed   Health Maintenance Review LIFESTYLE:  Exercise:   Not a lot cause of  arthrisit Tobacco/ETS:n Alcohol: n Sugar beverages:n Sleep:problematic helped with meds  Drug use: no HH: 1 pet cat      Hearing: stable   Vision:  No limitations at present . Last eye check UTD  Safety:  Has smoke detector and wears seat belts.  No firearms. No excess sun exposure. Sees dentist regularly.  Falls: n  Memory: Felt to be good  , no concern from her or her family.  Depression: No anhedonia unusual crying or depressive symptoms  Nutrition: Eats well balanced diet; adequate calcium and vitamin D. No swallowing  chewing problems.  Injury: no major injuries in the last six months.  Other healthcare providers:  Reviewed today .  Social:  Lives alone  petcates.   Preventive parameters:   Reviewed  ADLS:   There are no problems or need for assistance  driving, feeding, obtaining food, dressing, toileting and bathing, managing money using phone. She is independent. Plays bridge with large womens group    ROS:  GEN/ HEENT: No fever, significant weight changes sweats headaches vision problems hearing changes, CV/ PULM; No chest pain shortness of breath cough, syncope,edema  change in exercise tolerance. GI /GU: No adominal pain, vomiting, change in bowel habits. No blood in the stool. No significant new GU symptoms. SKIN/HEME: ,no acute skin rashes suspicious lesions or bleeding. No lymphadenopathy, nodules, masses.  NEURO/ PSYCH:  No neurologic signs such as weakness numbness. No depression anxiety. IMM/ Allergy: No unusual infections.  Allergy .   REST of 12 system review negative except as per HPI   Past Medical History:  Diagnosis Date  . Breast cancer (HCC)    hx of left with radiation and lumpectomy  . Diverticulosis   . GERD (gastroesophageal reflux disease)   . History of colonic polyps   . History of ERCP    and sphincterotomy for abnormal lfts and dilated biliary ducts 5/05  . Hyperlipidemia   . Migraine headache   . Pulmonary nodule   . RLS (restless legs syndrome)   . Wears contact  lenses     Family History  Problem Relation Age of Onset  . Mitral valve prolapse Son        with afib to have surgery    Social History   Socioeconomic History  . Marital status: Widowed    Spouse name: Not on file  . Number of children: Not on file  . Years of education: Not on file  . Highest education level: Not on file  Occupational History  . Not on file  Social Needs  . Financial resource strain: Not on file  . Food insecurity:    Worry: Not on file    Inability: Not on file    . Transportation needs:    Medical: Not on file    Non-medical: Not on file  Tobacco Use  . Smoking status: Former Smoker    Packs/day: 3.00    Years: 25.00    Pack years: 75.00    Types: Cigarettes    Last attempt to quit: 02/26/1994    Years since quitting: 24.0  . Smokeless tobacco: Never Used  Substance and Sexual Activity  . Alcohol use: No  . Drug use: No  . Sexual activity: Not on file  Lifestyle  . Physical activity:    Days per week: Not on file    Minutes per session: Not on file  . Stress: Not on file  Relationships  . Social connections:    Talks on phone: Not on file    Gets together: Not on file    Attends religious service: Not on file    Active member of club or organization: Not on file    Attends meetings of clubs or organizations: Not on file    Relationship status: Not on file  Other Topics Concern  . Not on file  Social History Narrative   Retired   Single widowed    International aid/development worker   HH of 1   No   No tad and bridge.                    Outpatient Encounter Medications as of 02/25/2018  Medication Sig  . AMBIEN 10 MG tablet take 1/2 tablet by mouth at bedtime AVOID DAILY USE MAY TAKE ADDITIONAL 1/2 tablets if needed  . anastrozole (ARIMIDEX) 1 MG tablet Take 1 tablet (1 mg total) by mouth daily.  Marland Kitchen BIOTIN PO Take 2,000 mcg by mouth daily.  Marland Kitchen CALCIUM PO Take 1,000 mg by mouth.  . cetirizine (ZYRTEC) 10 MG tablet Take 10 mg by mouth daily.  . Cholecalciferol (VITAMIN D3) 2000 UNITS TABS Take 1 tablet by mouth daily.  Marland Kitchen dicyclomine (BENTYL) 20 MG tablet TAKE 1 TABLET BY MOUTH FOUR TIMES DAILY BEFORE MEALS AND AT BEDTIME (Patient taking differently: Take 20 mg by mouth 4 (four) times daily -  before meals and at bedtime. TAKE 1 TABLET BY MOUTH FOUR TIMES DAILY BEFORE MEALS AND AT BEDTIME)  . fish oil-omega-3 fatty acids 1000 MG capsule Take 1 g by mouth daily.   . fluocinonide-emollient (LIDEX-E) 0.05 % cream Apply 1 application 2 (two) times daily  topically. As  Directed.  . mirtazapine (REMERON) 15 MG tablet take 1 tablet by mouth at bedtime CAN INCREASE TO 2 TABLETS AT BEDTIME FOR SLEEP  . Multiple Vitamin (MULTIVITAMIN) tablet Take 1 tablet by mouth daily.  . Probiotic Product ([employer] PROBIOTIC) CHEW Chew 2 capsules by mouth daily.  . ranitidine (ZANTAC) 150 MG tablet Take 1 tablet (150 mg  total) by mouth 2 (two) times daily. Needs appointment  . rosuvastatin (CRESTOR) 5 MG tablet Take 1 tablet (5 mg total) by mouth daily. Needs appointment  . sertraline (ZOLOFT) 100 MG tablet Take 1 tablet (100 mg total) by mouth daily.  Marland Kitchen Zoster Vaccine Adjuvanted [facility]) injection Inject 0.5 mLs into the muscle once for 1 dose. Repeat in 2-6 months   No facility-administered encounter medications on file as of 02/25/2018.     EXAM:  BP 111/75   Pulse 81   Temp 98.2 F (36.8 C)   Wt 159 lb (72.1 kg)   BMI 26.87 kg/m   Body mass index is 26.87 kg/m.  Physical Exam: Vital signs reviewed AST:MHDQ is a well-developed well-nourished alert cooperative   who appears stated age in no acute distress.  HEENT: normocephalic atraumatic , Eyes: PERRL EOM's full, conjunctiva clear, Nares: paten,t no deformity discharge or tenderness., Ears: no deformity EAC's clear TMs with normal landmarks. Mouth: clear OP, no lesions, edema.  Moist mucous membranes. Dentition in adequate repair. NECK: supple without masses, thyromegaly or bruits. CHEST/PULM:  Clear to auscultation and percussion breath sounds equal no wheeze , rales or rhonchi. Left breast changes right normal . CV: PMI is nondisplaced, S1 S2 no gallops, murmurs, rubs. Peripheral pulses arepresent .No JVD .  ABDOMEN: Bowel sounds normal nontender  No guard or rebound, no hepato splenomegal no CVA tenderness.   Extremtities:  No clubbing cyanosis or edema, n  Right hand deformity  Middle finger contracutres  And toe  Hammer no ulcers  Or lesions NEURO:  Oriented x3, cranial nerves 3-12 appear to be  intact, no obvious focal weakness,gait within normal limits  SKIN: No acute rashes normal turgor, color, no bruising or petechiae. PSYCH: Oriented, good eye contact, no obvious depression anxiety, cognition and judgment appear normal. LN: no cervical axillary inguinal adenopathy No noted deficits in memory, attention, and speech.   Lab Results  Component Value Date   WBC 6.6 11/06/2017   HGB 12.6 11/06/2017   HCT 38.0 11/06/2017   PLT 156 11/06/2017   GLUCOSE 100 11/06/2017   CHOL 203 (H) 01/08/2017   TRIG 174.0 (H) 01/08/2017   HDL 67.20 01/08/2017   LDLCALC 101 (H) 01/08/2017   ALT 19 11/06/2017   AST 26 11/06/2017   NA 143 11/06/2017   K 4.3 11/06/2017   CL 105 11/06/2017   CREATININE 1.50 (H) 11/06/2017   BUN 26 11/06/2017   CO2 28 11/06/2017   TSH 3.73 12/27/2015   INR 1.00 06/29/2014   HGBA1C 6.3 12/14/2013    ASSESSMENT AND PLAN:  Discussed the following assessment and plan:  INSOMNIA, CHRONIC  Medication management - Plan: Lipid panel, Basic metabolic panel, Hemoglobin A1c  Hyperlipidemia, unspecified hyperlipidemia type - Plan: Lipid panel, Basic metabolic panel, Hemoglobin A1c  Chronic renal impairment, unspecified CKD stage - Plan: Lipid panel, Basic metabolic panel, Hemoglobin A1c  Multiple joint pain - felt to be  djd with r hand deformity can use 2400 mg  per day tylenol  - Plan: Lipid panel, Basic metabolic panel, Hemoglobin A1c  Ductal carcinoma in situ (DCIS) of left breast - almost 5 years out   Osteoarthritis of multiple joints, unspecified osteoarthritis type  Patient Care Team: Burnis Medin, MD as PCP - General Dyke Maes, OD (Optometry) Kary Kos, MD (Neurosurgery) [name], MD as Consulting Physician (General Surgery) Truitt Merle, MD as Consulting Physician (Hematology) Daryll Brod, MD as Consulting Physician (Orthopedic Surgery) Ladene Artist, MD as  Consulting Physician (Gastroenterology) Warden Fillers, MD as  Consulting Physician (Ophthalmology) Harriett Sine, MD as Consulting Physician (Dermatology)  Patient Instructions  Clotilde Dieter are doing well.   Lab today .   Cholesterol and  Kidney function .   Caution with  the ambien as  Usual.    ROV in 6 months or as needed   Can get shingles vaccine at your pharmacy.     Standley Brooking. Mackenna Kamer M.D.

## 2018-03-04 ENCOUNTER — Telehealth: Payer: Self-pay | Admitting: Internal Medicine

## 2018-03-04 NOTE — Telephone Encounter (Signed)
Copied from [place] [id number]. Topic: General - Call Back - No Documentation >> Mar 04, 2018  3:14 PM Nimmons, Emilio Math, RN wrote: I left a voice message for pt to return my call, okay for triage nurse to give results.   Please call back

## 2018-03-05 NOTE — Telephone Encounter (Signed)
Pt given results per notes of Dr. Regis Bill on 02/27/18. Pt verbalized understanding.Unable to document in result note due to result note not being routed to [facility]. Pt would also like to be mailed a copy of the lab results.

## 2018-03-10 NOTE — Telephone Encounter (Signed)
Labs printed and placed in outgoing mail.  Nothing further needed.

## 2018-03-30 ENCOUNTER — Telehealth: Payer: Self-pay | Admitting: Internal Medicine

## 2018-03-30 MED ORDER — ROSUVASTATIN CALCIUM 5 MG PO TABS: 5.0000 mg | ORAL_TABLET | Freq: Every day | ORAL | 1 refills | Status: DC

## 2018-03-30 MED ORDER — RANITIDINE HCL 150 MG PO TABS: 150.0000 mg | ORAL_TABLET | Freq: Two times a day (BID) | ORAL | 1 refills | Status: DC

## 2018-03-30 NOTE — Telephone Encounter (Signed)
Copied from [place] [id number]. Topic: Quick Communication - See Telephone Encounter >> Mar 30, 2018 12:44 PM Mylinda Latina, NT wrote: CRM for notification. See Telephone encounter for: 03/30/18. Patient called and states she needs a refill of her ranitidine (ZANTAC) 150 MG tablet,rosuvastatin (CRESTOR) 5 MG tablet  [facility] - [place], [place] [phone] (Phone) [phone] (Fax)

## 2018-04-06 DIAGNOSIS — H0231 Blepharochalasis right upper eyelid: Secondary | ICD-10-CM | POA: Diagnosis not present

## 2018-04-06 DIAGNOSIS — H53489 Generalized contraction of visual field, unspecified eye: Secondary | ICD-10-CM | POA: Diagnosis not present

## 2018-04-06 DIAGNOSIS — H02411 Mechanical ptosis of right eyelid: Secondary | ICD-10-CM | POA: Diagnosis not present

## 2018-04-06 DIAGNOSIS — H0234 Blepharochalasis left upper eyelid: Secondary | ICD-10-CM | POA: Diagnosis not present

## 2018-04-17 ENCOUNTER — Other Ambulatory Visit: Payer: Self-pay

## 2018-06-05 DIAGNOSIS — Z23 Encounter for immunization: Secondary | ICD-10-CM | POA: Diagnosis not present

## 2018-06-17 DIAGNOSIS — H04332 Acute lacrimal canaliculitis of left lacrimal passage: Secondary | ICD-10-CM | POA: Diagnosis not present

## 2018-06-17 DIAGNOSIS — H16102 Unspecified superficial keratitis, left eye: Secondary | ICD-10-CM | POA: Diagnosis not present

## 2018-06-17 DIAGNOSIS — H01022 Squamous blepharitis right lower eyelid: Secondary | ICD-10-CM | POA: Diagnosis not present

## 2018-06-17 DIAGNOSIS — H01025 Squamous blepharitis left lower eyelid: Secondary | ICD-10-CM | POA: Diagnosis not present

## 2018-06-17 DIAGNOSIS — Z961 Presence of intraocular lens: Secondary | ICD-10-CM | POA: Diagnosis not present

## 2018-06-17 DIAGNOSIS — H01024 Squamous blepharitis left upper eyelid: Secondary | ICD-10-CM | POA: Diagnosis not present

## 2018-06-17 DIAGNOSIS — H01021 Squamous blepharitis right upper eyelid: Secondary | ICD-10-CM | POA: Diagnosis not present

## 2018-07-01 ENCOUNTER — Encounter: Payer: Self-pay | Admitting: *Deleted

## 2018-07-06 ENCOUNTER — Inpatient Hospital Stay: Payer: Medicare Other | Attending: Hematology | Admitting: Hematology

## 2018-07-06 ENCOUNTER — Inpatient Hospital Stay: Payer: Medicare Other

## 2018-07-08 ENCOUNTER — Ambulatory Visit: Payer: Medicare Other

## 2018-07-09 ENCOUNTER — Telehealth: Payer: Self-pay | Admitting: Hematology

## 2018-07-09 NOTE — Telephone Encounter (Signed)
Patient called to reschedule  °

## 2018-07-10 ENCOUNTER — Other Ambulatory Visit: Payer: Self-pay | Admitting: Internal Medicine

## 2018-07-10 NOTE — Telephone Encounter (Signed)
The patient needs a refill on Rx dicyclomine (BENTYL) 20 MG tablet    Sent to:  [facility] [place], [address] AT [place] [phone] (Phone) [phone] (Fax)   The patients bottle is empty but she said that she thinks that she has enough in her pill organizer for a couple of days.

## 2018-07-13 MED ORDER — DICYCLOMINE HCL 20 MG PO TABS: ORAL_TABLET | ORAL | 0 refills | Status: DC

## 2018-07-13 NOTE — Addendum Note (Signed)
Addended by: Virl Cagey on: 07/13/2018 05:37 PM   Modules accepted: Orders

## 2018-07-13 NOTE — Telephone Encounter (Signed)
Medication refilled up until appt scheduled 08/27/18 with Dr Regis Bill. Pt aware.  Nothing further needed.

## 2018-07-21 ENCOUNTER — Encounter: Payer: Self-pay | Admitting: Hematology

## 2018-07-21 ENCOUNTER — Telehealth: Payer: Self-pay

## 2018-07-21 ENCOUNTER — Inpatient Hospital Stay: Payer: Medicare Other | Attending: Hematology

## 2018-07-21 ENCOUNTER — Inpatient Hospital Stay (HOSPITAL_BASED_OUTPATIENT_CLINIC_OR_DEPARTMENT_OTHER): Payer: Medicare Other | Admitting: Hematology

## 2018-07-21 VITALS — BP 137/74 | HR 68 | Temp 98.1°F | Resp 18 | Ht 64.5 in | Wt 157.3 lb

## 2018-07-21 DIAGNOSIS — F329 Major depressive disorder, single episode, unspecified: Secondary | ICD-10-CM | POA: Insufficient documentation

## 2018-07-21 DIAGNOSIS — Z87891 Personal history of nicotine dependence: Secondary | ICD-10-CM

## 2018-07-21 DIAGNOSIS — D0512 Intraductal carcinoma in situ of left breast: Secondary | ICD-10-CM

## 2018-07-21 DIAGNOSIS — F4322 Adjustment disorder with anxiety: Secondary | ICD-10-CM

## 2018-07-21 DIAGNOSIS — Z170 Estrogen receptor positive status [ER+]: Secondary | ICD-10-CM | POA: Diagnosis not present

## 2018-07-21 DIAGNOSIS — M8580 Other specified disorders of bone density and structure, unspecified site: Secondary | ICD-10-CM

## 2018-07-21 DIAGNOSIS — Z79899 Other long term (current) drug therapy: Secondary | ICD-10-CM | POA: Insufficient documentation

## 2018-07-21 DIAGNOSIS — E2839 Other primary ovarian failure: Secondary | ICD-10-CM

## 2018-07-21 LAB — COMPREHENSIVE METABOLIC PANEL
ALBUMIN: 4 g/dL (ref 3.5–5.0)
ALT: 17 U/L (ref 0–44)
AST: 24 U/L (ref 15–41)
Alkaline Phosphatase: 113 U/L (ref 38–126)
Anion gap: 8 (ref 5–15)
BUN: 31 mg/dL — AB (ref 8–23)
CO2: 27 mmol/L (ref 22–32)
CREATININE: 1.59 mg/dL — AB (ref 0.44–1.00)
Calcium: 9.9 mg/dL (ref 8.9–10.3)
Chloride: 105 mmol/L (ref 98–111)
GFR calc Af Amer: 34 mL/min — ABNORMAL LOW (ref >60)
GFR calc non Af Amer: 29 mL/min — ABNORMAL LOW (ref >60)
Glucose, Bld: 104 mg/dL — ABNORMAL HIGH (ref 70–99)
POTASSIUM: 4.1 mmol/L (ref 3.5–5.1)
SODIUM: 140 mmol/L (ref 135–145)
Total Bilirubin: 0.2 mg/dL — ABNORMAL LOW (ref 0.3–1.2)
Total Protein: 6.8 g/dL (ref 6.5–8.1)

## 2018-07-21 LAB — CBC WITH DIFFERENTIAL/PLATELET
ABS IMMATURE GRANULOCYTES: 0.02 10*3/uL (ref 0.00–0.07)
BASOS ABS: 0.1 10*3/uL (ref 0.0–0.1)
Basophils Relative: 1 %
EOS PCT: 5 %
Eosinophils Absolute: 0.3 10*3/uL (ref 0.0–0.5)
HEMATOCRIT: 38.3 % (ref 36.0–46.0)
HEMOGLOBIN: 12.4 g/dL (ref 12.0–15.0)
Immature Granulocytes: 0 %
LYMPHS ABS: 1.6 10*3/uL (ref 0.7–4.0)
LYMPHS PCT: 23 %
MCH: 31.4 pg (ref 26.0–34.0)
MCHC: 32.4 g/dL (ref 30.0–36.0)
MCV: 97 fL (ref 80.0–100.0)
MONO ABS: 0.7 10*3/uL (ref 0.1–1.0)
Monocytes Relative: 10 %
NEUTROS ABS: 4.2 10*3/uL (ref 1.7–7.7)
Neutrophils Relative %: 61 %
Platelets: 171 10*3/uL (ref 150–400)
RBC: 3.95 MIL/uL (ref 3.87–5.11)
RDW: 12.8 % (ref 11.5–15.5)
WBC: 6.9 10*3/uL (ref 4.0–10.5)
nRBC: 0 % (ref 0.0–0.2)

## 2018-07-21 MED ORDER — ANASTROZOLE 1 MG PO TABS: 1.0000 mg | ORAL_TABLET | Freq: Every day | ORAL | 3 refills | Status: DC

## 2018-07-21 NOTE — Progress Notes (Addendum)
Subjective:   Jeanne Tran is a 84 y.o. female who presents for Medicare Annual/Subsequent preventive examination.  Reports health as good at present  2015 breast lumpectomy;  Some diarrhea  Has 3 children 2 boys and a girl dtr in [facility] and other live close by   Will make apt with Dr. Regis Bill (6 month fup)    Lives with a cat  No longer on celebrax so no meds for OA Play bridge a couple of times a  Month    Diet Eating well Breakfast cereal, raisin brain crunch, bagel Lunch may not eat Supper cooks Bakes a lot - makes small breads  Pimento cheese    Exercise Hurting some due to OA and no pain meds Can't walk much due to right hip  Now left knee is acting up   There are no preventive care reminders to display for this patient.   Tobacco; quit 95;   Zoster 2017 ; has her her shingrix series and will bring dates when she comes in for 6 month fup to see Dr. Regis Bill   Mammogram 02/15/2018 - due to lump found in left breast  Did BX but could not complete Scheduled mammogram in the next month   Dexa 06/2017 -1.5   Taking Calcium    Cardiac Risk Factors include: advanced age (>1men, >40 women);dyslipidemia;sedentary lifestyle     Objective:    Vitals: BP 108/60   Pulse 84   Ht 5' 4.5" (1.638 m)   Wt 155 lb (70.3 kg)   BMI 26.19 kg/m   Body mass index is 26.19 kg/m.  Advanced Directives 07/22/2018 11/06/2017 10/01/2016 03/22/2016 09/13/2015 05/09/2015 02/06/2015  Does Patient Have a Medical Advance Directive? Yes Yes Yes Yes No No Yes  Type of Advance Directive - - [place];Living will - - - [place];Living will  Does patient want to make changes to medical advance directive? - - - - - - -  Copy of [place] in Chart? - No - copy requested Yes No - copy requested - - No - copy requested  Would patient like information on creating a medical advance directive? - - - - No - patient declined  information - -    Tobacco Social History   Tobacco Use  Smoking Status Former Smoker  . Packs/day: 3.00  . Years: 25.00  . Pack years: 75.00  . Types: Cigarettes  . Last attempt to quit: 02/26/1994  . Years since quitting: 24.4  Smokeless Tobacco Never Used  Tobacco Comment   quit 24 years      Counseling given: Not Answered Comment: quit 24 years    Clinical Intake:     Past Medical History:  Diagnosis Date  . Breast cancer (HCC)    hx of left with radiation and lumpectomy  . Diverticulosis   . GERD (gastroesophageal reflux disease)   . History of colonic polyps   . History of ERCP    and sphincterotomy for abnormal lfts and dilated biliary ducts 5/05  . Hyperlipidemia   . Migraine headache   . Pulmonary nodule   . RLS (restless legs syndrome)   . Wears contact lenses    Past Surgical History:  Procedure Laterality Date  . ABDOMINAL HYSTERECTOMY     bso  . APPENDECTOMY    . BREAST LUMPECTOMY  1995   lt lumpsnbx  . BREAST LUMPECTOMY WITH RADIOACTIVE SEED LOCALIZATION Left 07/01/2014   Procedure: LEFT BREAST  LUMPECTOMY WITH RADIOACTIVE SEED LOCALIZATION;  Surgeon: [name], MD;  Location: [place];  Service: General;  Laterality: Left;  . CERVICAL SPINE SURGERY  3/03  . CHOLECYSTECTOMY  2012  . Complete Hysterectomy    . CYSTOCELE REPAIR    . ERCP     for abnormal lfts and dilated biliary ducts 5/05  . gallbladder duct open  2006  . HEMORRHOID SURGERY    . HYSTEROSCOPY    . LYMPH NODE BIOPSY    . Orthoscopic Rt Knee    . SPHINCTEROTOMY     for abnormal lfts and dilated biliary ducts 5/05  . tonsillectomy     Family History  Problem Relation Age of Onset  . Mitral valve prolapse Son        with afib to have surgery   Social History   Socioeconomic History  . Marital status: Widowed    Spouse name: Not on file  . Number of children: Not on file  . Years of education: Not on file  . Highest education level: Not on file    Occupational History  . Not on file  Social Needs  . Financial resource strain: Not on file  . Food insecurity:    Worry: Not on file    Inability: Not on file  . Transportation needs:    Medical: Not on file    Non-medical: Not on file  Tobacco Use  . Smoking status: Former Smoker    Packs/day: 3.00    Years: 25.00    Pack years: 75.00    Types: Cigarettes    Last attempt to quit: 02/26/1994    Years since quitting: 24.4  . Smokeless tobacco: Never Used  . Tobacco comment: quit 24 years   Substance and Sexual Activity  . Alcohol use: No  . Drug use: No  . Sexual activity: Not on file  Lifestyle  . Physical activity:    Days per week: Not on file    Minutes per session: Not on file  . Stress: Not on file  Relationships  . Social connections:    Talks on phone: Not on file    Gets together: Not on file    Attends religious service: Not on file    Active member of club or organization: Not on file    Attends meetings of clubs or organizations: Not on file    Relationship status: Not on file  Other Topics Concern  . Not on file  Social History Narrative   Retired   Single widowed    International aid/development worker   HH of 1   No   No tad and bridge.                    Outpatient Encounter Medications as of 07/22/2018  Medication Sig  . AMBIEN 10 MG tablet take 1/2 tablet by mouth at bedtime AVOID DAILY USE MAY TAKE ADDITIONAL 1/2 tablets if needed  . anastrozole (ARIMIDEX) 1 MG tablet Take 1 tablet (1 mg total) by mouth daily.  Marland Kitchen BIOTIN PO Take 2,000 mcg by mouth daily.  Marland Kitchen CALCIUM PO Take 1,000 mg by mouth.  . cetirizine (ZYRTEC) 10 MG tablet Take 10 mg by mouth daily.  . Cholecalciferol (VITAMIN D3) 2000 UNITS TABS Take 1 tablet by mouth daily.  Marland Kitchen dicyclomine (BENTYL) 20 MG tablet TAKE 1 TABLET BY MOUTH THREE TIMES DAILY BEFORE MEALS AND AT BEDTIME  . fish oil-omega-3 fatty acids 1000 MG capsule Take  1 g by mouth daily.   . fluocinonide-emollient (LIDEX-E) 0.05 % cream Apply 1  application 2 (two) times daily topically. As  Directed.  . mirtazapine (REMERON) 15 MG tablet take 1 tablet by mouth at bedtime CAN INCREASE TO 2 TABLETS AT BEDTIME FOR SLEEP  . Multiple Vitamin (MULTIVITAMIN) tablet Take 1 tablet by mouth daily.  . Probiotic Product (CVS PROBIOTIC) CHEW Chew 2 capsules by mouth daily.  . ranitidine (ZANTAC) 150 MG tablet Take 1 tablet (150 mg total) by mouth 2 (two) times daily.  . rosuvastatin (CRESTOR) 5 MG tablet Take 1 tablet (5 mg total) by mouth daily.  . sertraline (ZOLOFT) 100 MG tablet Take 1 tablet (100 mg total) by mouth daily.  . [DISCONTINUED] anastrozole (ARIMIDEX) 1 MG tablet Take 1 tablet (1 mg total) by mouth daily.   No facility-administered encounter medications on file as of 07/22/2018.     Activities of Daily Living In your present state of health, do you have any difficulty performing the following activities: 07/22/2018  Hearing? N  Vision? N  Difficulty concentrating or making decisions? N  Walking or climbing stairs? N  Dressing or bathing? N  Doing errands, shopping? N  Preparing Food and eating ? N  Using the Toilet? N  In the past six months, have you accidently leaked urine? N  Do you have problems with loss of bowel control? Y  Managing your Medications? N  Managing your Finances? N  Housekeeping or managing your Housekeeping? N  Some recent data might be hidden    Patient Care Team: Panosh, Standley Brooking, MD as PCP - General Dyke Maes, OD (Optometry) Kary Kos, MD (Neurosurgery) [name], MD as Consulting Physician (General Surgery) Truitt Merle, MD as Consulting Physician (Hematology) Daryll Brod, MD as Consulting Physician (Orthopedic Surgery) Ladene Artist, MD as Consulting Physician (Gastroenterology) Warden Fillers, MD as Consulting Physician (Ophthalmology) Harriett Sine, MD as Consulting Physician (Dermatology)   Assessment:   This is a routine wellness examination for  [place].  Exercise Activities and Dietary recommendations Current Exercise Habits: Home exercise routine, Intensity: Mild, Exercise limited by: orthopedic condition(s)  Goals    . Patient Stated     Try to control the diarrhea; discuss with Dr. Mal Amabile 3 times as opposed to 4 times because it is making her drowsy  May feel better with med adjustment  Drink plenty of fluids     . Reduce fat intake to X grams per day     Drinks a lot of water -  Decrease sweet and fat intake        Fall Risk Fall Risk  07/22/2018 04/17/2018 12/10/2016 12/27/2015 10/18/2014  Falls in the past year? 1 Yes Yes No Yes  Comment fell yesterday; putting up Halloween things; slipped  Emmi Telephone Survey: data to providers prior to load - - -  Number falls in past yr: 0 1 1 - -  Comment - Emmi Telephone Survey Actual Response = 1 - - -  Injury with Fall? - No - - -  Risk for fall due to : (No Data) - - - (No Data)  Risk for fall due to: Comment slipped  - - - see notes   Follow up Education provided - Education provided - -     Depression Screen PHQ 2/9 Scores 07/22/2018 12/10/2016 12/27/2015 10/18/2014  PHQ - 2 Score 0 0 0 0    Cognitive Function MMSE - Mini Mental State Exam 07/22/2018 12/10/2016  Not completed: (  No Data) (No Data)   Paying bills, taking meds; making decisions No problems with finances          Immunization History  Administered Date(s) Administered  . H1N1 08/29/2008  . Influenza Whole 09/16/1997, 05/16/2012  . Influenza, High Dose Seasonal PF 06/16/2014, 05/20/2017, 06/05/2018  . Influenza,inj,Quad PF,6+ Mos 05/18/2013, 05/15/2015, 05/29/2016  . Influenza-Unspecified 05/20/2017  . Pneumococcal Conjugate-13 12/14/2013  . Pneumococcal-Unspecified 10/28/2000  . Td 09/16/2001, 12/14/2013  . Tetanus 12/14/2013  . Zoster 05/29/2016      Screening Tests Health Maintenance  Topic Date Due  . MAMMOGRAM  02/18/2020  . TETANUS/TDAP  12/15/2023  . INFLUENZA VACCINE   Completed  . DEXA SCAN  Completed  . PNA vac Low Risk Adult  Completed         Plan:      PCP Notes   Health Maintenance Tobacco; quit 95;   Zoster 2017 ; has her her shingrix series and will bring dates when she comes in for 6 month fup to see Dr. Regis Bill   Mammogram 02/15/2018 - due to lump found in left breast  Did BX but could not complete Scheduled mammogram in the next month   Dexa 06/2017 -1.5   Taking Calcium    Abnormal Screens  none  Referrals  none  Patient concerns; Mid am drowsiness and pain generalized, neck; (OA) Can't take celebrax any more  Taking tylenol 500 x 3  with Grade 3b renal disease  C/o of more diarrhea and still following with GI  Was told to take 4 bentyl a day but that was to much; made her feel "out of it". Now take 3 with meals Still on Ambien 5mg  and remeron at hs    Nurse Concerns; Vague c/o of fatigue, but overall, c/o of soreness, not walking as much due to OA; taking Tylenol and probably needs more but did not recommend due to GFR  Will fup with Dr. Regis Bill   Next PCP apt Making a December apt for fup when leaving today      I have personally reviewed and noted the following in the patient's chart:   . Medical and social history . Use of alcohol, tobacco or illicit drugs  . Current medications and supplements . Functional ability and status . Nutritional status . Physical activity . Advanced directives . List of other physicians . Hospitalizations, surgeries, and ER visits in previous 12 months . Vitals . Screenings to include cognitive, depression, and falls . Referrals and appointments  In addition, I have reviewed and discussed with patient certain preventive protocols, quality metrics, and best practice recommendations. A written personalized care plan for preventive services as well as general preventive health recommendations were provided to patient.     TGYBW,LSLHT, RN  07/22/2018  Above noted reviewed  and agree. Shanon Ace, MD

## 2018-07-21 NOTE — Progress Notes (Signed)
[place] PROGRESS NOTE  Patient Care Team: Panosh, Standley Brooking, MD as PCP - General Dyke Maes, [place] (Optometry) Kary Kos, MD (Neurosurgery) [name], MD as Consulting Physician (General Surgery) Truitt Merle, MD as Consulting Physician (Hematology) Daryll Brod, MD as Consulting Physician (Orthopedic Surgery) Ladene Artist, MD as Consulting Physician (Gastroenterology) Warden Fillers, MD as Consulting Physician (Ophthalmology) Harriett Sine, MD as Consulting Physician (Dermatology)   Date of Service:  07/21/2018    CHIEF COMPLAINTS:  Follow up left breast DCIS    Oncology History   Diagnosed in 1995, s/p lumpectomy and 5 years Tamoxifen. Records not available      Ductal carcinoma in situ (DCIS) of left breast   1995 Cancer Diagnosis    History of left breast cancer, status post lumpectomy and 5 years tamoxifen.    06/08/2014 Initial Diagnosis    Cancer of left breast    06/08/2014 Initial Biopsy    Left breast biopsy showed DCIS.    07/01/2014 Surgery    Left breast lumpectomy, surgical margins were negative.    08/10/2014 -  Anti-estrogen oral therapy    She started adjuvant tamoxifen, and was subsequently switched to anastrozole a few months later due to her anti-depression medications Zoloft. Plan to comeplete in 07/2018      HISTORY OF PRESENTING ILLNESS:  Jeanne Tran 84 y.o. female is here because of recent diagnosis of left breast DCIS.   She is referred by Dr. Zella Richer because of a new diagnosis of left breast DCIS. She has a previous history of left breast cancer in 1995 and was treated with lumpectomy, axillary lymph node dissection, radiation, and 7 years tamoxifen. She has been followed by yearly mammogram. She was noted to have a new mass in the left breast on recent mammography at the 12 to 1:00 position, middle depth. Image guided biopsy was performed with the above pathology. The tumor is ER and PR positive.    She was seen by Dr. Zella Richer. She declined mastectomy, underwent lumpectomy on 07/01/2014. The surgery went well without any complications. She has recovered well from the surgery, except mild fatigue after the surgery. She is able to function well at home. She denies any pain, dyspnea, abdominal discomfort, or any other symptoms.  In terms of breast cancer risk profile:  She menarched at early age of 12 and went to menopause at age mind 44's.   She had 3 pregnancy, her first child was born at age 8.   She did not breast-fed her child.  She did received birth control pills for several years.  She was on hormone replacement therapy for 12-13 years.  She has no family history of Breast/GYN/GI cancer  CURRENT THERAPY: Anastrozole 1 mg once daily, started in 07/2014    INTERIM HISTORY:  Denis returns for follow-up of her left breast cancer. She presents to the clinic by herself today.  She is clinically doing well, tolerating anastrozole very well, denies any significant hot flash, arthralgia, or other side effects.  She denies significant pain, or other discomfort.  Her appetite and energy level are decent, she is able to function well at home.    MEDICAL HISTORY:  Past Medical History:  Diagnosis Date  . Breast cancer (HCC)    hx of left with radiation and lumpectomy  . Diverticulosis   . GERD (gastroesophageal reflux disease)   . History of colonic polyps   . History of ERCP    and sphincterotomy for abnormal lfts  and dilated biliary ducts 5/05  . Hyperlipidemia   . Migraine headache   . Pulmonary nodule   . RLS (restless legs syndrome)   . Wears contact lenses     SURGICAL HISTORY: Past Surgical History:  Procedure Laterality Date  . ABDOMINAL HYSTERECTOMY     bso  . APPENDECTOMY    . BREAST LUMPECTOMY  1995   lt lumpsnbx  . BREAST LUMPECTOMY WITH RADIOACTIVE SEED LOCALIZATION Left 07/01/2014   Procedure: LEFT BREAST LUMPECTOMY WITH RADIOACTIVE SEED  LOCALIZATION;  Surgeon: [name], MD;  Location: [facility];  Service: General;  Laterality: Left;  . CERVICAL SPINE SURGERY  3/03  . CHOLECYSTECTOMY  2012  . Complete Hysterectomy    . CYSTOCELE REPAIR    . ERCP     for abnormal lfts and dilated biliary ducts 5/05  . gallbladder duct open  2006  . HEMORRHOID SURGERY    . HYSTEROSCOPY    . LYMPH NODE BIOPSY    . Orthoscopic Rt Knee    . SPHINCTEROTOMY     for abnormal lfts and dilated biliary ducts 5/05  . tonsillectomy      SOCIAL HISTORY: History   Social History  . Marital Status: Widowed    Spouse Name: N/A    Number of Children: N/A  . Years of Education: N/A  She lives alone at home.   Occupational History  . Not on file.   Social History Main Topics  . Smoking status: Former Smoker -- 3.00 packs/day for 25 years    Types: Cigarettes    Quit date: 02/26/1994  . Smokeless tobacco: Never Used  . Alcohol Use: No  . Drug Use: No  . 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                    FAMILY HISTORY: No family history of malignancy.  ALLERGIES:  is allergic to asa [aspirin] and codeine.  MEDICATIONS:  Current Outpatient Medications  Medication Sig Dispense Refill  . AMBIEN 10 MG tablet take 1/2 tablet by mouth at bedtime AVOID DAILY USE MAY TAKE ADDITIONAL 1/2 tablets if needed 90 tablet 0  . anastrozole (ARIMIDEX) 1 MG tablet Take 1 tablet (1 mg total) by mouth daily. 90 tablet 3  . BIOTIN PO Take 2,000 mcg by mouth daily.    Marland Kitchen CALCIUM PO Take 1,000 mg by mouth.    . cetirizine (ZYRTEC) 10 MG tablet Take 10 mg by mouth daily.    . Cholecalciferol (VITAMIN D3) 2000 UNITS TABS Take 1 tablet by mouth daily.    Marland Kitchen dicyclomine (BENTYL) 20 MG tablet TAKE 1 TABLET BY MOUTH THREE TIMES DAILY BEFORE MEALS AND AT BEDTIME 270 tablet 0  . fish oil-omega-3 fatty acids 1000 MG capsule  Take 1 g by mouth daily.     . fluocinonide-emollient (LIDEX-E) 0.05 % cream Apply 1 application 2 (two) times daily topically. As  Directed. 30 g 0  . mirtazapine (REMERON) 15 MG tablet take 1 tablet by mouth at bedtime CAN INCREASE TO 2 TABLETS AT BEDTIME FOR SLEEP 180 tablet 1  . Multiple Vitamin (MULTIVITAMIN) tablet Take 1 tablet by mouth daily.    . Probiotic Product ([employer] PROBIOTIC) CHEW Chew 2 capsules by mouth daily.    Marland Kitchen  ranitidine (ZANTAC) 150 MG tablet Take 1 tablet (150 mg total) by mouth 2 (two) times daily. 180 tablet 1  . rosuvastatin (CRESTOR) 5 MG tablet Take 1 tablet (5 mg total) by mouth daily. 90 tablet 1  . sertraline (ZOLOFT) 100 MG tablet Take 1 tablet (100 mg total) by mouth daily. 90 tablet 1   No current facility-administered medications for this visit.     REVIEW OF SYSTEMS:  Constitutional: Denies fevers, chills or abnormal night sweats Eyes: Denies blurriness of vision, double vision or watery eyes Ears, nose, mouth, throat, and face: Denies mucositis or sore throat Respiratory: Denies cough, dyspnea or wheezes Cardiovascular: Denies palpitation, chest discomfort or lower extremity swelling Gastrointestinal:  Denies nausea, heartburn or change in bowel habits Skin: Denies abnormal skin rashes Lymphatics: Denies new lymphadenopathy or easy bruising Neurological:Denies numbness, tingling or new weaknesses Behavioral/Psych: Mood is stable, no new changes  All other systems were reviewed with the patient and are negative.  PHYSICAL EXAMINATION: ECOG PERFORMANCE STATUS: 0 - Asymptomatic  Vitals:   07/21/18 1342  BP: 137/74  Pulse: 68  Resp: 18  Temp: 98.1 F (36.7 C)  SpO2: 100%   Filed Weights   07/21/18 1342  Weight: 157 lb 4.8 oz (71.4 kg)     GENERAL:alert, no distress and comfortable SKIN: skin color, texture, turgor are normal, no rashes or significant lesions EYES: normal, conjunctiva are pink and non-injected, sclera clear OROPHARYNX:no  exudate, no erythema and lips, buccal mucosa, and tongue normal  NECK: supple, thyroid normal size, non-tender, without nodularity LYMPH:  no palpable lymphadenopathy in the cervical, axillary or inguinal LUNGS: clear to auscultation and percussion with normal breathing effort HEART: regular rate & rhythm and no murmurs and no lower extremity edema ABDOMEN:abdomen soft, non-tender and normal bowel sounds Musculoskeletal:no cyanosis of digits and no clubbing  PSYCH: alert & oriented x 3 with fluent speech NEURO: no focal motor/sensory deficits Breasts: S/p left lumpectomy: Her left breast is smaller than right. (+) Surgical incision healed well, mild scarring. No other palpable mass or adenopathy in either breast or axilla.    LABORATORY DATA:  I have reviewed the data as listed  CBC Latest Ref Rng & Units 07/21/2018 11/06/2017 04/17/2017  WBC 4.0 - 10.5 K/uL 6.9 6.6 7.4  Hemoglobin 12.0 - 15.0 g/dL 12.4 12.6 13.2  Hematocrit 36.0 - 46.0 % 38.3 38.0 39.5  Platelets 150 - 400 K/uL 171 156 180    CMP Latest Ref Rng & Units 02/25/2018 11/06/2017 07/30/2017  Glucose 70 - 99 mg/dL 91 100 83  BUN 6 - 23 mg/dL 29(H) 26 27(H)  Creatinine 0.40 - 1.20 mg/dL 1.61(H) 1.50(H) 1.51(H)  Sodium 135 - 145 mEq/L 140 143 141  Potassium 3.5 - 5.1 mEq/L 4.3 4.3 4.3  Chloride 96 - 112 mEq/L 103 105 103  CO2 19 - 32 mEq/L 29 28 30   Calcium 8.4 - 10.5 mg/dL 9.6 10.0 9.9  Total Protein 6.4 - 8.3 g/dL - 6.8 -  Total Bilirubin 0.2 - 1.2 mg/dL - 0.4 -  Alkaline Phos 40 - 150 U/L - 113 -  AST 5 - 34 U/L - 26 -  ALT 0 - 55 U/L - 19 -   PATHOLOGY  Diagnosis 06/28/17 Breast, left, needle core biopsy, 1:00 o'clock - FAT NECROSIS. - THERE IS NO EVIDENCE OF MALIGNANCY. - SEE COMMENT. Microscopic Comment The results were called to [facility] on 07/17/17. (JBK:gt, 07/17/17)   Pathology report 07/01/2014 Diagnosis 1. Breast, lumpectomy, left -  DUCTAL CARCINOMA IN SITU WITH CALCIFICATIONS, LOW GRADE,  SPANNING 1.8 CM. - THE SURGICAL RESECTION MARGINS ARE NEGATIVE FOR CARCINOMA. - SEE ONCOLOGY TABLE BELOW. 2. Breast, excision, medial margin left - FIBROCYSTIC CHANGES. - THERE IS NO EVIDENCE OF MALIGNANCY. - SEE COMMENT. 3. Breast, excision, superior margin left - FIBROCYSTIC CHANGES. - THERE IS NO EVIDENCE OF MALIGNANCY. - SEE COMMENT. 4. Breast, excision, lateral margin left - FIBROCYSTIC CHANGES. - THERE IS NO EVIDENCE OF MALIGNANCY. - SEE COMMENT. 5. Breast, excision, inferior margin left - FIBROCYSTIC CHANGES. - THERE IS NO EVIDENCE OF MALIGNANCY. - SEE COMMENT. Microscopic Comment 1. BREAST, IN SITU CARCINOMA Specimen, including laterality: Left breast Procedure (include lymph node sampling sentinel-non-sentinel: Lumpectomy and additional margin resection x 4 Grade of carcinoma: Low grade Necrosis: Not identified. Estimated tumor size: (gross measurement): 1.8 cm Treatment effect: N/A Distance to closest margin: Greater than 0.2 cm to all margins Breast prognostic profile: [phone] 1 of 3 FINAL for SHERLYNN, [place] ([id number]) Microscopic Comment(continued) Estrogen receptor: 100%, strong staining intensity Progesterone receptor: 100%, strong staining intensity TNM: pTis, pNX (JK:kh 07-04-14) 2. -5. The surgical resection  RADIOGRAPHIC STUDIES:  Breast US Left 07/14/17 IMPRESSION:  The 7 mm lesion in the left breast is at a low suspicion for malignancy. An ultrasound guided biopsy is recommended.   Bone Density from [facility] 07/14/17    Bone density scan 11/15/2014 FINDINGS: AP LUMBAR SPINE L1-L4 Bone Mineral Density (BMD): 0.984 g/cm2 Young Adult T-Score: -0.6 Z-Score: 2.1 Left FEMUR neck Bone Mineral Density (BMD): 0.700 g/cm2 Young Adult T-Score: -1.3 Z-Score: 0.9 ASSESSMENT: Patient's diagnostic category is LOW BONE MASS by WHO Criteria.  ASSESSMENT: 85 y.o. female, with left breast DCIS s/p lumpectomy, ER/PR  positive  PLAN:  #1 left breast DCIS, ER/PR positive -The patient had early stage disease. She is considered to be cured by surgery alone. Any form of adjuvant treatment is for prevention of disease recurrence.  -Due to her prior left breast radiation history, no adjuvant radiation at this time -I recommended adjuvant antiestrogen therapy to reduce risk of breast cancer in the future. She started Tamoxifen in 06/2014 -She tolerated tamoxifen quite well, but she was not happy with her Zoloft and was previously switched to Effexor.  -So tamoxifen was switched to anastrozole to prevent drug interaction. She is tolerating it well, plan for total 5 years. -She had an abnormal breast US on 07/14/17 which showed a 60mm lesion in her left breast. She had a US guided biopsy on 07/16/17 which showed the mass to be benign fat necrosis. Her repeated mammogram and ultrasound in June 2019 was unremarkable. -She is due for bilateral mammogram last month, she rescheduled for this month. -She is clinically doing well. Lab reviewed, her CBC is normal, CMP still pending.  Her physical exam was unremarkable. There is no clinical concern for recurrence. -She is tolerating Anastrozole well. Will continue until late November 2020 to complete a 5-year therapy, refilled for her today   -F/u in one year   #2 osteopenia - I recommend she continue taking calcium 1 g a day and vitamin D. -Bone density scan on 11/15/2014 showed osteopenia. -I previously discussed the option of adding bisphosphonate, she really doesn't want take additional medication. -I discussed anastrozole may potentially make her osteopenia worse.  -07/14/2017 Bone density scan shows Osteopenia with lowest site at right femur neck with a T-Score of -1.50. Some areas have worsened while others have improved, overall stable.  -I encouraged her to prevent risk for  fall due to her increased risk of fracture. I suggest she start using a cane.    #3  depression -She takes Ambien and Mirtazapine for sleep. -Mood well controlled on Zoloft.   Plan -Continue Anastrozole until Nov 2020, refilled today  -She is scheduled for [facility] the [facility] this [facility] -Lab and follow-up in 1 year   All questions were answered. The patient knows to call the clinic with any problems, questions or concerns. I spent 15 minutes counseling the patient face to face. The total time spent in the appointment was 20 minutes and more than 50% was on counseling    Truitt Merle, MD 07/21/2018

## 2018-07-21 NOTE — Telephone Encounter (Signed)
Printed avs and calender of upcoming appointment. Per 11/5 los 

## 2018-07-22 ENCOUNTER — Ambulatory Visit (INDEPENDENT_AMBULATORY_CARE_PROVIDER_SITE_OTHER): Payer: Medicare Other

## 2018-07-22 VITALS — BP 108/60 | HR 84 | Ht 64.5 in | Wt 155.0 lb

## 2018-07-22 DIAGNOSIS — Z0000 Encounter for general adult medical examination without abnormal findings: Secondary | ICD-10-CM

## 2018-07-22 NOTE — Patient Instructions (Addendum)
[name] , Thank you for taking time to come for your Medicare Wellness Visit. I appreciate your ongoing commitment to your health goals. Please review the following plan we discussed and let me know if I can assist you in the future.   Will make fup 6 month apt with Dr Regis Bill  in December  Last seen her in June   Once you take your second shingrix, bring the dates for both when you see Dr. Regis Bill   These are the goals we discussed: Goals    . Patient Stated     Try to control the diarrhea; discuss with Dr. Mal Amabile 3 times as opposed to 4 times because it is making her drowsy  May feel better with med adjustment  Drink plenty of fluids     . Reduce fat intake to X grams per day     Drinks a lot of water -  Decrease sweet and fat intake        This is a list of the screening recommended for you and due dates:  Health Maintenance  Topic Date Due  . Mammogram  02/18/2020  . Tetanus Vaccine  12/15/2023  . Flu Shot  Completed  . DEXA scan (bone density measurement)  Completed  . Pneumonia vaccines  Completed      Fall Prevention in the Home Falls can cause injuries. They can happen to people of all ages. There are many things you can do to make your home safe and to help prevent falls. What can I do on the outside of my home?  Regularly fix the edges of walkways and driveways and fix any cracks.  Remove anything that might make you trip as you walk through a door, such as a raised step or threshold.  Trim any bushes or trees on the path to your home.  Use bright outdoor lighting.  Clear any walking paths of anything that might make someone trip, such as rocks or tools.  Regularly check to see if handrails are loose or broken. Make sure that both sides of any steps have handrails.  Any raised decks and porches should have guardrails on the edges.  Have any leaves, snow, or ice cleared regularly.  Use sand or salt on walking paths during winter.  Clean up any  spills in your garage right away. This includes oil or grease spills. What can I do in the bathroom?  Use night lights.  Install grab bars by the toilet and in the tub and shower. Do not use towel bars as grab bars.  Use non-skid mats or decals in the tub or shower.  If you need to sit down in the shower, use a plastic, non-slip stool.  Keep the floor dry. Clean up any water that spills on the floor as soon as it happens.  Remove soap buildup in the tub or shower regularly.  Attach bath mats securely with double-sided non-slip rug tape.  Do not have throw rugs and other things on the floor that can make you trip. What can I do in the bedroom?  Use night lights.  Make sure that you have a light by your bed that is easy to reach.  Do not use any sheets or blankets that are too big for your bed. They should not hang down onto the floor.  Have a firm chair that has side arms. You can use this for support while you get dressed.  Do not have throw rugs and other  things on the floor that can make you trip. What can I do in the kitchen?  Clean up any spills right away.  Avoid walking on wet floors.  Keep items that you use a lot in easy-to-reach places.  If you need to reach something above you, use a strong step stool that has a grab bar.  Keep electrical cords out of the way.  Do not use floor polish or wax that makes floors slippery. If you must use wax, use non-skid floor wax.  Do not have throw rugs and other things on the floor that can make you trip. What can I do with my stairs?  Do not leave any items on the stairs.  Make sure that there are handrails on both sides of the stairs and use them. Fix handrails that are broken or loose. Make sure that handrails are as long as the stairways.  Check any carpeting to make sure that it is firmly attached to the stairs. Fix any carpet that is loose or worn.  Avoid having throw rugs at the top or bottom of the stairs. If you  do have throw rugs, attach them to the floor with carpet tape.  Make sure that you have a light switch at the top of the stairs and the bottom of the stairs. If you do not have them, ask someone to add them for you. What else can I do to help prevent falls?  Wear shoes that: ? Do not have high heels. ? Have rubber bottoms. ? Are comfortable and fit you well. ? Are closed at the toe. Do not wear sandals.  If you use a stepladder: ? Make sure that it is fully opened. Do not climb a closed stepladder. ? Make sure that both sides of the stepladder are locked into place. ? Ask someone to hold it for you, if possible.  Clearly mark and make sure that you can see: ? Any grab bars or handrails. ? First and last steps. ? Where the edge of each step is.  Use tools that help you move around (mobility aids) if they are needed. These include: ? Canes. ? Walkers. ? Scooters. ? Crutches.  Turn on the lights when you go into a dark area. Replace any light bulbs as soon as they burn out.  Set up your furniture so you have a clear path. Avoid moving your furniture around.  If any of your floors are uneven, fix them.  If there are any pets around you, be aware of where they are.  Review your medicines with your doctor. Some medicines can make you feel dizzy. This can increase your chance of falling. Ask your doctor what other things that you can do to help prevent falls. This information is not intended to replace advice given to you by your health care provider. Make sure you discuss any questions you have with your health care provider. Document Released: 06/29/2009 Document Revised: 02/08/2016 Document Reviewed: 10/07/2014 Elsevier Interactive Patient Education  2018 [employer] Maintenance, Female Adopting a healthy lifestyle and getting preventive care can go a long way to promote health and wellness. Talk with your health care provider about what schedule of regular  examinations is right for you. This is a good chance for you to check in with your provider about disease prevention and staying healthy. In between checkups, there are plenty of things you can do on your own. Experts have done a lot of research about which lifestyle  changes and preventive measures are most likely to keep you healthy. Ask your health care provider for more information. Weight and diet Eat a healthy diet  Be sure to include plenty of vegetables, fruits, low-fat dairy products, and lean protein.  Do not eat a lot of foods high in solid fats, added sugars, or salt.  Get regular exercise. This is one of the most important things you can do for your health. ? Most adults should exercise for at least 150 minutes each week. The exercise should increase your heart rate and make you sweat (moderate-intensity exercise). ? Most adults should also do strengthening exercises at least twice a week. This is in addition to the moderate-intensity exercise.  Maintain a healthy weight  Body mass index (BMI) is a measurement that can be used to identify possible weight problems. It estimates body fat based on height and weight. Your health care provider can help determine your BMI and help you achieve or maintain a healthy weight.  For females 84 years of age and older: ? A BMI below 18.5 is considered underweight. ? A BMI of 18.5 to 24.9 is normal. ? A BMI of 25 to 29.9 is considered overweight. ? A BMI of 30 and above is considered obese.  Watch levels of cholesterol and blood lipids  You should start having your blood tested for lipids and cholesterol at 82 years of age, then have this test every 5 years.  You may need to have your cholesterol levels checked more often if: ? Your lipid or cholesterol levels are high. ? You are older than 82 years of age. ? You are at high risk for heart disease.  Cancer screening Lung Cancer  Lung cancer screening is recommended for adults 29-23  years old who are at high risk for lung cancer because of a history of smoking.  A yearly low-dose CT scan of the lungs is recommended for people who: ? Currently smoke. ? Have quit within the past 15 years. ? Have at least a 30-pack-year history of smoking. A pack year is smoking an average of one pack of cigarettes a day for 1 year.  Yearly screening should continue until it has been 15 years since you quit.  Yearly screening should stop if you develop a health problem that would prevent you from having lung cancer treatment.  Breast Cancer  Practice breast self-awareness. This means understanding how your breasts normally appear and feel.  It also means doing regular breast self-exams. Let your health care provider know about any changes, no matter how small.  If you are in your 20s or 30s, you should have a clinical breast exam (CBE) by a health care provider every 1-3 years as part of a regular health exam.  If you are 41 or older, have a CBE every year. Also consider having a breast X-ray (mammogram) every year.  If you have a family history of breast cancer, talk to your health care provider about genetic screening.  If you are at high risk for breast cancer, talk to your health care provider about having an MRI and a mammogram every year.  Breast cancer gene (BRCA) assessment is recommended for women who have family members with BRCA-related cancers. BRCA-related cancers include: ? Breast. ? Ovarian. ? Tubal. ? Peritoneal cancers.  Results of the assessment will determine the need for genetic counseling and BRCA1 and BRCA2 testing.  Cervical Cancer Your health care provider may recommend that you be screened regularly for cancer  of the pelvic organs (ovaries, uterus, and vagina). This screening involves a pelvic examination, including checking for microscopic changes to the surface of your cervix (Pap test). You may be encouraged to have this screening done every 3 years,  beginning at age 42.  For women ages 30-65, health care providers may recommend pelvic exams and Pap testing every 3 years, or they may recommend the Pap and pelvic exam, combined with testing for human papilloma virus (HPV), every 5 years. Some types of HPV increase your risk of cervical cancer. Testing for HPV may also be done on women of any age with unclear Pap test results.  Other health care providers may not recommend any screening for nonpregnant women who are considered low risk for pelvic cancer and who do not have symptoms. Ask your health care provider if a screening pelvic exam is right for you.  If you have had past treatment for cervical cancer or a condition that could lead to cancer, you need Pap tests and screening for cancer for at least 20 years after your treatment. If Pap tests have been discontinued, your risk factors (such as having a new sexual partner) need to be reassessed to determine if screening should resume. Some women have medical problems that increase the chance of getting cervical cancer. In these cases, your health care provider may recommend more frequent screening and Pap tests.  Colorectal Cancer  This type of cancer can be detected and often prevented.  Routine colorectal cancer screening usually begins at 82 years of age and continues through 82 years of age.  Your health care provider may recommend screening at an earlier age if you have risk factors for colon cancer.  Your health care provider may also recommend using home test kits to check for hidden blood in the stool.  A small camera at the end of a tube can be used to examine your colon directly (sigmoidoscopy or colonoscopy). This is done to check for the earliest forms of colorectal cancer.  Routine screening usually begins at age 54.  Direct examination of the colon should be repeated every 5-10 years through 82 years of age. However, you may need to be screened more often if early forms of  precancerous polyps or small growths are found.  Skin Cancer  Check your skin from head to toe regularly.  Tell your health care provider about any new moles or changes in moles, especially if there is a change in a mole's shape or color.  Also tell your health care provider if you have a mole that is larger than the size of a pencil eraser.  Always use sunscreen. Apply sunscreen liberally and repeatedly throughout the day.  Protect yourself by wearing long sleeves, pants, a wide-brimmed hat, and sunglasses whenever you are outside.  Heart disease, diabetes, and high blood pressure  High blood pressure causes heart disease and increases the risk of stroke. High blood pressure is more likely to develop in: ? People who have blood pressure in the high end of the normal range (130-139/85-89 mm Hg). ? People who are overweight or obese. ? People who are African American.  If you are 75-70 years of age, have your blood pressure checked every 3-5 years. If you are 10 years of age or older, have your blood pressure checked every year. You should have your blood pressure measured twice-once when you are at a hospital or clinic, and once when you are not at a hospital or clinic. Record the  average of the two measurements. To check your blood pressure when you are not at a hospital or clinic, you can use: ? An automated blood pressure machine at a pharmacy. ? A home blood pressure monitor.  If you are between 18 years and 31 years old, ask your health care provider if you should take aspirin to prevent strokes.  Have regular diabetes screenings. This involves taking a blood sample to check your fasting blood sugar level. ? If you are at a normal weight and have a low risk for diabetes, have this test once every three years after 82 years of age. ? If you are overweight and have a high risk for diabetes, consider being tested at a younger age or more often. Preventing infection Hepatitis B  If  you have a higher risk for hepatitis B, you should be screened for this virus. You are considered at high risk for hepatitis B if: ? You were born in a country where hepatitis B is common. Ask your health care provider which countries are considered high risk. ? Your parents were born in a high-risk country, and you have not been immunized against hepatitis B (hepatitis B vaccine). ? You have HIV or AIDS. ? You use needles to inject street drugs. ? You live with someone who has hepatitis B. ? You have had sex with someone who has hepatitis B. ? You get hemodialysis treatment. ? You take certain medicines for conditions, including cancer, organ transplantation, and autoimmune conditions.  Hepatitis C  Blood testing is recommended for: ? Everyone born from [birth year] through 1963. ? Anyone with known risk factors for hepatitis C.  Sexually transmitted infections (STIs)  You should be screened for sexually transmitted infections (STIs) including gonorrhea and chlamydia if: ? You are sexually active and are younger than 82 years of age. ? You are older than 82 years of age and your health care provider tells you that you are at risk for this type of infection. ? Your sexual activity has changed since you were last screened and you are at an increased risk for chlamydia or gonorrhea. Ask your health care provider if you are at risk.  If you do not have HIV, but are at risk, it may be recommended that you take a prescription medicine daily to prevent HIV infection. This is called pre-exposure prophylaxis (PrEP). You are considered at risk if: ? You are sexually active and do not regularly use condoms or know the HIV status of your partner(s). ? You take drugs by injection. ? You are sexually active with a partner who has HIV.  Talk with your health care provider about whether you are at high risk of being infected with HIV. If you choose to begin PrEP, you should first be tested for HIV. You should  then be tested every 3 months for as long as you are taking PrEP. Pregnancy  If you are premenopausal and you may become pregnant, ask your health care provider about preconception counseling.  If you may become pregnant, take 400 to 800 micrograms (mcg) of folic acid every day.  If you want to prevent pregnancy, talk to your health care provider about birth control (contraception). Osteoporosis and menopause  Osteoporosis is a disease in which the bones lose minerals and strength with aging. This can result in serious bone fractures. Your risk for osteoporosis can be identified using a bone density scan.  If you are 29 years of age or older, or if you are  at risk for osteoporosis and fractures, ask your health care provider if you should be screened.  Ask your health care provider whether you should take a calcium or vitamin D supplement to lower your risk for osteoporosis.  Menopause may have certain physical symptoms and risks.  Hormone replacement therapy may reduce some of these symptoms and risks. Talk to your health care provider about whether hormone replacement therapy is right for you. Follow these instructions at home:  Schedule regular health, dental, and eye exams.  Stay current with your immunizations.  Do not use any tobacco products including cigarettes, chewing tobacco, or electronic cigarettes.  If you are pregnant, do not drink alcohol.  If you are breastfeeding, limit how much and how often you drink alcohol.  Limit alcohol intake to no more than 1 drink per day for nonpregnant women. One drink equals 12 ounces of beer, 5 ounces of wine, or 1 ounces of hard liquor.  Do not use street drugs.  Do not share needles.  Ask your health care provider for help if you need support or information about quitting drugs.  Tell your health care provider if you often feel depressed.  Tell your health care provider if you have ever been abused or do not feel safe at  home. This information is not intended to replace advice given to you by your health care provider. Make sure you discuss any questions you have with your health care provider. Document Released: 03/18/2011 Document Revised: 02/08/2016 Document Reviewed: 06/06/2015 Elsevier Interactive Patient Education  [employer].

## 2018-07-23 DIAGNOSIS — R921 Mammographic calcification found on diagnostic imaging of breast: Secondary | ICD-10-CM | POA: Diagnosis not present

## 2018-07-23 DIAGNOSIS — Z853 Personal history of malignant neoplasm of breast: Secondary | ICD-10-CM | POA: Diagnosis not present

## 2018-07-23 LAB — HM MAMMOGRAPHY

## 2018-08-05 ENCOUNTER — Other Ambulatory Visit: Payer: Self-pay

## 2018-08-17 ENCOUNTER — Telehealth: Payer: Self-pay | Admitting: Internal Medicine

## 2018-08-17 NOTE — Telephone Encounter (Signed)
Patient is completely out of Ambien.  She needs a refill called in to [facility] on [address]. She is requesting a call once it is called in.

## 2018-08-18 MED ORDER — AMBIEN 10 MG PO TABS: ORAL_TABLET | ORAL | 0 refills | Status: DC

## 2018-08-18 NOTE — Telephone Encounter (Signed)
Last filled 01/09/18 #90 refills 0 4 Last OV 02/25/18

## 2018-08-18 NOTE — Telephone Encounter (Signed)
Sent in electronically .  

## 2018-08-18 NOTE — Telephone Encounter (Signed)
Called and left detailed message informing patient that prescription was sent into pharmacy.

## 2018-08-28 ENCOUNTER — Encounter: Payer: Self-pay | Admitting: Internal Medicine

## 2018-09-10 ENCOUNTER — Other Ambulatory Visit: Payer: Self-pay

## 2018-09-10 DIAGNOSIS — F4322 Adjustment disorder with anxiety: Secondary | ICD-10-CM

## 2018-09-10 MED ORDER — SERTRALINE HCL 100 MG PO TABS: 100.0000 mg | ORAL_TABLET | Freq: Every day | ORAL | 1 refills | Status: DC

## 2018-09-17 ENCOUNTER — Telehealth: Payer: Self-pay | Admitting: Internal Medicine

## 2018-09-17 MED ORDER — FAMOTIDINE 20 MG PO TABS: 20.0000 mg | ORAL_TABLET | Freq: Two times a day (BID) | ORAL | 1 refills | Status: DC

## 2018-09-17 NOTE — Telephone Encounter (Signed)
Pt requesting a refill of Ambien -- Okayed per Dr Regis Bill  Requesting an alternative for Ranitidine which has been recalled -- Per Dr Regis Bill try Famotidine 20mg , 1 po BID #180 x 1 refill.   Called and spoke with patient, aware that she is not due for Ambien refill, last refilled 08/18/18 for #90 which should last her 3-75months depending on if she take a 1/2 or 1 tablet daily.  Pt aware that Famotidine is being sent to pharmacy - pt requests this be sent to [facility].  Nothing further needed.

## 2018-10-01 ENCOUNTER — Other Ambulatory Visit: Payer: Self-pay | Admitting: Internal Medicine

## 2018-10-01 MED ORDER — ROSUVASTATIN CALCIUM 5 MG PO TABS: 5.0000 mg | ORAL_TABLET | Freq: Every day | ORAL | 1 refills | Status: DC

## 2018-10-01 NOTE — Telephone Encounter (Signed)
Copied from [place] [id number]. Topic: Quick Communication - Rx Refill/Question >> Oct 01, 2018 12:51 PM Rayann Heman wrote: Medication: rosuvastatin (CRESTOR) 5 MG tablet [[id number]]   Has the patient contacted their pharmacy? Yes/pharmacy told to call  Preferred Pharmacy (with phone number or street name): [facility] [place], [address] AT [address] [phone] (Phone) [phone] (Fax)      Agent: Please be advised that RX refills may take up to 3 business days. We ask that you follow-up with your pharmacy.

## 2018-10-01 NOTE — Telephone Encounter (Signed)
Requested Prescriptions  Pending Prescriptions Disp Refills  . rosuvastatin (CRESTOR) 5 MG tablet 90 tablet 1    Sig: Take 1 tablet (5 mg total) by mouth daily.     Cardiovascular:  Antilipid - Statins Passed - 10/01/2018 12:55 PM      Passed - Total Cholesterol in normal range and within 360 days    Cholesterol  Date Value Ref Range Status  02/25/2018 154 0 - 200 mg/dL Final    Comment:    ATP III Classification       Desirable:  < 200 mg/dL               Borderline High:  200 - 239 mg/dL          High:  > = 240 mg/dL         Passed - LDL in normal range and within 360 days    LDL Cholesterol  Date Value Ref Range Status  02/25/2018 61 0 - 99 mg/dL Final         Passed - HDL in normal range and within 360 days    HDL  Date Value Ref Range Status  02/25/2018 63.80 >39.00 mg/dL Final         Passed - Triglycerides in normal range and within 360 days    Triglycerides  Date Value Ref Range Status  02/25/2018 146.0 0.0 - 149.0 mg/dL Final    Comment:    Normal:  <150 mg/dLBorderline High:  150 - 199 mg/dL   Triglyceride fasting, serum  Date Value Ref Range Status  07/30/2006 91 0 - 149 mg/dL Final    Comment:    See lab report for associated comment(s)         Passed - Patient is not pregnant      Passed - Valid encounter within last 12 months    Recent Outpatient Visits          7 months ago [facility], [place] [facility], Standley Brooking, MD   1 year ago Renal insufficiency   [facility] at [facility], Standley Brooking, MD   1 year ago Hyperlipidemia, unspecified hyperlipidemia type   [facility] at [facility], Standley Brooking, MD   2 years ago [place] at [facility], Standley Brooking, MD   2 years ago Medicare annual wellness visit, subsequent   Therapist, [facility] at [facility], Standley Brooking, MD      Future Appointments            In 3 months Pan[place], Standley Brooking, MD [place] [facility] [place],  [facility]   In 10 months  [employer] [facility] at [place], [facility]

## 2018-12-10 ENCOUNTER — Telehealth: Payer: Self-pay | Admitting: Internal Medicine

## 2018-12-10 NOTE — Telephone Encounter (Signed)
Copied from [place] [id number]. Topic: Quick Communication - Rx Refill/Question >> Dec 10, 2018  3:43 PM Richardo Priest, NT wrote: Medication:  AMBIEN 10 MG tablet   Has the patient contacted their pharmacy? Yes patient states they no longer have any. Requesting  90 day supply.  Preferred Pharmacy (with phone number or street name):  [facility] [place], [address] AT [address] [phone] (Phone) [phone] (Fax)  Agent: Please be advised that RX refills may take up to 3 business days. We ask that you follow-up with your pharmacy.

## 2018-12-11 MED ORDER — AMBIEN 10 MG PO TABS: ORAL_TABLET | ORAL | 0 refills | Status: DC

## 2018-12-11 NOTE — Telephone Encounter (Signed)
Sent in electronically .   Has fu appt may ?  If problem plan telephone or webex visit

## 2018-12-11 NOTE — Telephone Encounter (Signed)
Sent in electronically .  

## 2019-01-19 NOTE — Progress Notes (Signed)
Virtual Visit via Video Note  I connected with@ on 01/20/19 at  2:00 PM EDT by a  phone visit application  Pt has no smart phone  or  computer and verified that I am speaking with the correct person using two identifiers. Location patient: home Location provider:work office Persons participating in the virtual visit: patient, provider  [place]th national recommendations  regarding COVID 19 pandemic  Phone visit is advised over in office visit for this patient.  Discussed the limitations of evaluation and management by telemedicine and  availability of in person appointments. The patient expressed understanding and agreed to proceed.   HPI: Jeanne Tran    Last ov was 6 87 had wellness in november 19  Over due for 6 mos visit   Still in care for her dx DCIS  Dr Burr Medico  She has had chonric insomnia and  Has used many modalities over the years  And attempts to avoid   High risk meds have  Had more harm than help  .  Taking Remeron 15 and 5 Ambien at night without sig se and drowsiness the next day  No falling and feels memory as good as possible   Lives alone with cat an doing ok in pandemic  Has food and freezer stocked up.  Will take some walks  Bones are creaky but she is self sufficient hip arthritis   IBS uses dicyclomine tid helps   Dec stools to 3 x instead of 56 x per day   Mood  Doing well on sertraline.    ROS: See pertinent positives and negatives per HPI. No cp sob falling   Past Medical History:  Diagnosis Date  . Breast cancer (HCC)    hx of left with radiation and lumpectomy  . Diverticulosis   . GERD (gastroesophageal reflux disease)   . History of colonic polyps   . History of ERCP    and sphincterotomy for abnormal lfts and dilated biliary ducts 5/05  . Hyperlipidemia   . Migraine headache   . Pulmonary nodule   . RLS (restless legs syndrome)   . Wears contact lenses     Past Surgical History:  Procedure Laterality Date  . ABDOMINAL HYSTERECTOMY     bso   . APPENDECTOMY    . BREAST LUMPECTOMY  1995   lt lumpsnbx  . BREAST LUMPECTOMY WITH RADIOACTIVE SEED LOCALIZATION Left 07/01/2014   Procedure: LEFT BREAST LUMPECTOMY WITH RADIOACTIVE SEED LOCALIZATION;  Surgeon: [name], MD;  Location: [facility];  Service: General;  Laterality: Left;  . CERVICAL SPINE SURGERY  3/03  . CHOLECYSTECTOMY  2012  . Complete Hysterectomy    . CYSTOCELE REPAIR    . ERCP     for abnormal lfts and dilated biliary ducts 5/05  . gallbladder duct open  2006  . HEMORRHOID SURGERY    . HYSTEROSCOPY    . LYMPH NODE BIOPSY    . Orthoscopic Rt Knee    . SPHINCTEROTOMY     for abnormal lfts and dilated biliary ducts 5/05  . tonsillectomy      Family History  Problem Relation Age of Onset  . Mitral valve prolapse Son        with afib to have surgery    Social History   Tobacco Use  . Smoking status: Former Smoker    Packs/day: 3.00    Years: 25.00    Pack years: 75.00    Types: Cigarettes    Last attempt  to quit: 02/26/1994    Years since quitting: 24.9  . Smokeless tobacco: Never Used  . Tobacco comment: quit 24 years   Substance Use Topics  . Alcohol use: No  . Drug use: No      Current Outpatient Medications:  .  AMBIEN 10 MG tablet, take 1/2 tablet by mouth at bedtime AVOID DAILY USE MAY TAKE ADDITIONAL 1/2 tablets if needed, Disp: 90 tablet, Rfl: 0 .  anastrozole (ARIMIDEX) 1 MG tablet, Take 1 tablet (1 mg total) by mouth daily., Disp: 90 tablet, Rfl: 3 .  BIOTIN PO, Take 2,000 mcg by mouth daily., Disp: , Rfl:  .  CALCIUM PO, Take 1,000 mg by mouth., Disp: , Rfl:  .  cetirizine (ZYRTEC) 10 MG tablet, Take 10 mg by mouth daily., Disp: , Rfl:  .  Cholecalciferol (VITAMIN D3) 2000 UNITS TABS, Take 1 tablet by mouth daily., Disp: , Rfl:  .  dicyclomine (BENTYL) 20 MG tablet, TAKE 1 TABLET BY MOUTH THREE TIMES DAILY BEFORE MEALS AND AT BEDTIME, Disp: 270 tablet, Rfl: 1 .  famotidine (PEPCID) 20 MG tablet, Take 1 tablet (20  mg total) by mouth 2 (two) times daily., Disp: 180 tablet, Rfl: 1 .  fish oil-omega-3 fatty acids 1000 MG capsule, Take 1 g by mouth daily. , Disp: , Rfl:  .  fluocinonide-emollient (LIDEX-E) 0.05 % cream, Apply 1 application 2 (two) times daily topically. As  Directed., Disp: 30 g, Rfl: 0 .  mirtazapine (REMERON) 15 MG tablet, take 1 tablet by mouth at bedtime CAN INCREASE TO 2 TABLETS AT BEDTIME FOR SLEEP, Disp: 180 tablet, Rfl: 1 .  Multiple Vitamin (MULTIVITAMIN) tablet, Take 1 tablet by mouth daily., Disp: , Rfl:  .  Probiotic Product ([employer] PROBIOTIC) CHEW, Chew 2 capsules by mouth daily., Disp: , Rfl:  .  rosuvastatin (CRESTOR) 5 MG tablet, Take 1 tablet (5 mg total) by mouth daily., Disp: 90 tablet, Rfl: 1 .  sertraline (ZOLOFT) 100 MG tablet, Take 1 tablet (100 mg total) by mouth daily., Disp: 90 tablet, Rfl: 1 .  zolpidem (AMBIEN) 10 MG tablet, take 1/2 tablet by mouth at bedtime AVOID DAILY USE MAY TAKE ADDITIONAL 1/2 tablets if needed, Disp: 90 tablet, Rfl: 0  EXAM: BP Readings from Last 3 Encounters:  07/22/18 108/60  07/21/18 137/74  02/25/18 111/75    PSYCH/NEURO: pleasant and cooperative, no obvious depression or anxiety, speech and thought processing grossly intact Lab Results  Component Value Date   WBC 6.9 07/21/2018   HGB 12.4 07/21/2018   HCT 38.3 07/21/2018   PLT 171 07/21/2018   GLUCOSE 104 (H) 07/21/2018   CHOL 154 02/25/2018   TRIG 146.0 02/25/2018   HDL 63.80 02/25/2018   LDLCALC 61 02/25/2018   ALT 17 07/21/2018   AST 24 07/21/2018   NA 140 07/21/2018   K 4.1 07/21/2018   CL 105 07/21/2018   CREATININE 1.59 (H) 07/21/2018   BUN 31 (H) 07/21/2018   CO2 27 07/21/2018   TSH 3.73 12/27/2015   INR 1.00 06/29/2014   HGBA1C 6.2 02/25/2018    ASSESSMENT AND PLAN:  Discussed the following assessment and plan:  INSOMNIA, CHRONIC - Plan: Basic metabolic panel, CBC with Differential/Platelet, Hemoglobin A1c, Hepatic function panel, Lipid panel  Medication  management - Plan: Basic metabolic panel, CBC with Differential/Platelet, Hemoglobin A1c, Hepatic function panel, Lipid panel  Hyperlipidemia, unspecified hyperlipidemia type - Plan: Basic metabolic panel, CBC with Differential/Platelet, Hemoglobin A1c, Hepatic function panel, Lipid panel  Renal insufficiency -  Plan: Basic metabolic panel, CBC with Differential/Platelet, Hemoglobin A1c, Hepatic function panel, Lipid panel  Osteoarthritis of multiple joints, unspecified osteoarthritis type - Plan: Basic metabolic panel, CBC with Differential/Platelet, Hemoglobin A1c, Hepatic function panel, Lipid panel  Chronic renal impairment, unspecified CKD stage - Plan: Basic metabolic panel, CBC with Differential/Platelet, Hemoglobin A1c, Hepatic function panel, Lipid panel  High risk medication use - Plan: Basic metabolic panel, CBC with Differential/Platelet, Hemoglobin A1c, Hepatic function panel, Lipid panel  Fasting hyperglycemia - Plan: Basic metabolic panel, CBC with Differential/Platelet, Hemoglobin A1c, Hepatic function panel, Lipid panel Mood :  Sertraline refilled by oncology team Counseled.   Due lipids  And  Bmp a1c lfts plan labs   And then    Physical visit in the  Next few months   Expectant management and discussion of plan and treatment with patient with opportunity to ask questions and all were answered. The patient agreed with the plan and demonstrated an understanding of the instructions.   The patient was advised to call back or seek an in-person evaluation if worsening  or having concerns .    Shanon Ace, MD

## 2019-01-20 ENCOUNTER — Ambulatory Visit (INDEPENDENT_AMBULATORY_CARE_PROVIDER_SITE_OTHER): Payer: Medicare Other | Admitting: Internal Medicine

## 2019-01-20 ENCOUNTER — Other Ambulatory Visit: Payer: Self-pay

## 2019-01-20 ENCOUNTER — Encounter: Payer: Self-pay | Admitting: Internal Medicine

## 2019-01-20 DIAGNOSIS — G4700 Insomnia, unspecified: Secondary | ICD-10-CM | POA: Diagnosis not present

## 2019-01-20 DIAGNOSIS — R7301 Impaired fasting glucose: Secondary | ICD-10-CM

## 2019-01-20 DIAGNOSIS — N289 Disorder of kidney and ureter, unspecified: Secondary | ICD-10-CM | POA: Diagnosis not present

## 2019-01-20 DIAGNOSIS — M159 Polyosteoarthritis, unspecified: Secondary | ICD-10-CM | POA: Diagnosis not present

## 2019-01-20 DIAGNOSIS — Z79899 Other long term (current) drug therapy: Secondary | ICD-10-CM | POA: Diagnosis not present

## 2019-01-20 DIAGNOSIS — N189 Chronic kidney disease, unspecified: Secondary | ICD-10-CM

## 2019-01-20 DIAGNOSIS — E785 Hyperlipidemia, unspecified: Secondary | ICD-10-CM | POA: Diagnosis not present

## 2019-01-20 MED ORDER — DICYCLOMINE HCL 20 MG PO TABS: ORAL_TABLET | ORAL | 1 refills | Status: DC

## 2019-01-20 MED ORDER — MIRTAZAPINE 15 MG PO TABS: ORAL_TABLET | ORAL | 1 refills | Status: DC

## 2019-01-20 MED ORDER — ZOLPIDEM TARTRATE 10 MG PO TABS: ORAL_TABLET | ORAL | 0 refills | Status: DC

## 2019-01-21 ENCOUNTER — Telehealth: Payer: Self-pay

## 2019-01-21 NOTE — Telephone Encounter (Signed)
Left voicemail for pt tp call back to schedule lab appt    for now  and then  in person yearly viist    in 3-4 months

## 2019-01-21 NOTE — Telephone Encounter (Signed)
Left voicemail to schedule lab appt and cpe in September

## 2019-01-22 ENCOUNTER — Other Ambulatory Visit: Payer: Self-pay

## 2019-01-22 ENCOUNTER — Other Ambulatory Visit (INDEPENDENT_AMBULATORY_CARE_PROVIDER_SITE_OTHER): Payer: Medicare Other

## 2019-01-22 DIAGNOSIS — G4700 Insomnia, unspecified: Secondary | ICD-10-CM | POA: Diagnosis not present

## 2019-01-22 DIAGNOSIS — R7301 Impaired fasting glucose: Secondary | ICD-10-CM

## 2019-01-22 DIAGNOSIS — E785 Hyperlipidemia, unspecified: Secondary | ICD-10-CM | POA: Diagnosis not present

## 2019-01-22 DIAGNOSIS — M159 Polyosteoarthritis, unspecified: Secondary | ICD-10-CM

## 2019-01-22 DIAGNOSIS — N289 Disorder of kidney and ureter, unspecified: Secondary | ICD-10-CM

## 2019-01-22 DIAGNOSIS — Z79899 Other long term (current) drug therapy: Secondary | ICD-10-CM | POA: Diagnosis not present

## 2019-01-22 DIAGNOSIS — N189 Chronic kidney disease, unspecified: Secondary | ICD-10-CM

## 2019-01-22 LAB — CBC WITH DIFFERENTIAL/PLATELET
Basophils Absolute: 0.1 10*3/uL (ref 0.0–0.1)
Basophils Relative: 1 % (ref 0.0–3.0)
Eosinophils Absolute: 0.4 10*3/uL (ref 0.0–0.7)
Eosinophils Relative: 6.8 % — ABNORMAL HIGH (ref 0.0–5.0)
HCT: 39.9 % (ref 36.0–46.0)
Hemoglobin: 13.5 g/dL (ref 12.0–15.0)
Lymphocytes Relative: 31.2 % (ref 12.0–46.0)
Lymphs Abs: 2 10*3/uL (ref 0.7–4.0)
MCHC: 33.8 g/dL (ref 30.0–36.0)
MCV: 96.4 fl (ref 78.0–100.0)
Monocytes Absolute: 0.7 10*3/uL (ref 0.1–1.0)
Monocytes Relative: 10.6 % (ref 3.0–12.0)
Neutro Abs: 3.2 10*3/uL (ref 1.4–7.7)
Neutrophils Relative %: 50.4 % (ref 43.0–77.0)
Platelets: 188 10*3/uL (ref 150.0–400.0)
RBC: 4.13 Mil/uL (ref 3.87–5.11)
RDW: 13.8 % (ref 11.5–15.5)
WBC: 6.3 10*3/uL (ref 4.0–10.5)

## 2019-01-22 LAB — BASIC METABOLIC PANEL
BUN: 32 mg/dL — ABNORMAL HIGH (ref 6–23)
CO2: 32 mEq/L (ref 19–32)
Calcium: 9.8 mg/dL (ref 8.4–10.5)
Chloride: 102 mEq/L (ref 96–112)
Creatinine, Ser: 1.47 mg/dL — ABNORMAL HIGH (ref 0.40–1.20)
GFR: 33.92 mL/min — ABNORMAL LOW (ref >60.00)
Glucose, Bld: 82 mg/dL (ref 70–99)
Potassium: 4.1 mEq/L (ref 3.5–5.1)
Sodium: 141 mEq/L (ref 135–145)

## 2019-01-22 LAB — LIPID PANEL
Cholesterol: 181 mg/dL (ref 0–200)
HDL: 74.9 mg/dL (ref >39.00)
LDL Cholesterol: 83 mg/dL (ref 0–99)
NonHDL: 105.91
Total CHOL/HDL Ratio: 2
Triglycerides: 115 mg/dL (ref 0.0–149.0)
VLDL: 23 mg/dL (ref 0.0–40.0)

## 2019-01-22 LAB — HEPATIC FUNCTION PANEL
ALT: 19 U/L (ref 0–35)
AST: 25 U/L (ref 0–37)
Albumin: 4.3 g/dL (ref 3.5–5.2)
Alkaline Phosphatase: 94 U/L (ref 39–117)
Bilirubin, Direct: 0.1 mg/dL (ref 0.0–0.3)
Total Bilirubin: 0.5 mg/dL (ref 0.2–1.2)
Total Protein: 6.7 g/dL (ref 6.0–8.3)

## 2019-01-22 LAB — HEMOGLOBIN A1C: Hgb A1c MFr Bld: 6.3 % (ref 4.6–6.5)

## 2019-01-28 ENCOUNTER — Telehealth: Payer: Self-pay | Admitting: Internal Medicine

## 2019-02-01 ENCOUNTER — Other Ambulatory Visit: Payer: Self-pay

## 2019-02-01 MED ORDER — DICYCLOMINE HCL 20 MG PO TABS: ORAL_TABLET | ORAL | 1 refills | Status: DC

## 2019-02-01 NOTE — Telephone Encounter (Signed)
Rx has been resent 

## 2019-02-01 NOTE — Telephone Encounter (Signed)
Transmission on 01/20/2019 failed - please resend RX for dicyclomine (BENTYL) 20 MG tablet   [id number] - [place], [place] - [address].Marland KitchenMarland Kitchen

## 2019-03-10 ENCOUNTER — Telehealth: Payer: Self-pay | Admitting: Internal Medicine

## 2019-03-10 MED ORDER — FLUOCINONIDE-E 0.05 % EX CREA: 1.0000 "application " | TOPICAL_CREAM | Freq: Two times a day (BID) | CUTANEOUS | 0 refills | Status: DC

## 2019-03-10 NOTE — Telephone Encounter (Signed)
Medication Refill - Medication: fluocinonide-emollient (LIDEX-E) 0.05 % cream    Has the patient contacted their pharmacy? Yes.   (Agent: If no, request that the patient contact the pharmacy for the refill.) (Agent: If yes, when and what did the pharmacy advise?)  Preferred Pharmacy (with phone number or street name): [facility] [address] Pt was just seen in May   Agent: Please be advised that RX refills may take up to 3 business days. We ask that you follow-up with your pharmacy.

## 2019-03-10 NOTE — Addendum Note (Signed)
Addended byShanon Ace K on: 03/10/2019 04:14 PM   Modules accepted: Orders

## 2019-03-10 NOTE — Telephone Encounter (Signed)
Sent in electronically .  

## 2019-03-11 ENCOUNTER — Other Ambulatory Visit: Payer: Self-pay | Admitting: Internal Medicine

## 2019-03-11 ENCOUNTER — Other Ambulatory Visit: Payer: Self-pay | Admitting: Hematology

## 2019-03-11 DIAGNOSIS — F4322 Adjustment disorder with anxiety: Secondary | ICD-10-CM

## 2019-03-15 ENCOUNTER — Other Ambulatory Visit: Payer: Self-pay | Admitting: Hematology

## 2019-03-15 DIAGNOSIS — F4322 Adjustment disorder with anxiety: Secondary | ICD-10-CM

## 2019-03-15 MED ORDER — SERTRALINE HCL 100 MG PO TABS: 100.0000 mg | ORAL_TABLET | Freq: Every day | ORAL | 1 refills | Status: DC

## 2019-05-18 NOTE — Progress Notes (Signed)
Chief Complaint  Patient presents with  . Annual Exam    PT has no concerns   . Medication Management  . Osteoarthritis    HPI: Jeanne Tran 85 y.o. come in for Chronic disease management  And yearly med check    Arthritis  : the same taking  tylenol alone  Right hand the same with dec rom motion fingers   .  Sleep taking remeron 1 and ambien 5 or less  To sleep  Feels well with this and gets up in about 9  In am   Has one more year of  rx for breast cancer   GI:  Stable on prn bentyl and  pepcid   Mood sertraline   Ok   HLD  crestor taking ok   Skin area    Not sure if changing  Mid chest .   There are no preventive care reminders to display for this patient. Had a fall  ROS: See pertinent positives and negatives per HPI. No cp sob  Had a fall on steps  Left elbow abrasion  Getting better   Past Medical History:  Diagnosis Date  . Breast cancer (HCC)    hx of left with radiation and lumpectomy  . Diverticulosis   . GERD (gastroesophageal reflux disease)   . History of colonic polyps   . History of ERCP    and sphincterotomy for abnormal lfts and dilated biliary ducts 5/05  . Hyperlipidemia   . Migraine headache   . Pulmonary nodule   . RLS (restless legs syndrome)   . Wears contact lenses     Family History  Problem Relation Age of Onset  . Mitral valve prolapse Son        with afib to have surgery    Social History   Socioeconomic History  . Marital status: Widowed    Spouse name: Not on file  . Number of children: Not on file  . Years of education: Not on file  . Highest education level: Not on file  Occupational History  . Not on file  Social Needs  . Financial resource strain: Not on file  . Food insecurity    Worry: Not on file    Inability: Not on file  . Transportation needs    Medical: Not on file    Non-medical: Not on file  Tobacco Use  . Smoking status: Former Smoker    Packs/day: 3.00    Years: 25.00    Pack years:  75.00    Types: Cigarettes    Quit date: 02/26/1994    Years since quitting: 25.2  . Smokeless tobacco: Never Used  . Tobacco comment: quit 24 years   Substance and Sexual Activity  . Alcohol use: No  . Drug use: No  . Sexual activity: Not on file  Lifestyle  . Physical activity    Days per week: Not on file    Minutes per session: Not on file  . Stress: Not on file  Relationships  . Social Herbalist on phone: Not on file    Gets together: Not on file    Attends religious service: Not on file    Active member of club or organization: Not on file    Attends meetings of clubs or organizations: Not on file    Relationship status: Not on file  Other Topics Concern  . Not on file  Social History Narrative   Retired   Single  widowed    pet cat   HH of 1   No   No [place].                    Outpatient Medications Prior to Visit  Medication Sig Dispense Refill  . AMBIEN 10 MG tablet take 1/2 tablet by mouth at bedtime AVOID DAILY USE MAY TAKE ADDITIONAL 1/2 tablets if needed 90 tablet 0  . anastrozole (ARIMIDEX) 1 MG tablet Take 1 tablet (1 mg total) by mouth daily. 90 tablet 3  . BIOTIN PO Take 2,000 mcg by mouth daily.    Marland Kitchen CALCIUM PO Take 1,000 mg by mouth.    . cetirizine (ZYRTEC) 10 MG tablet Take 10 mg by mouth daily.    . Cholecalciferol (VITAMIN D3) 2000 UNITS TABS Take 1 tablet by mouth daily.    Marland Kitchen dicyclomine (BENTYL) 20 MG tablet TAKE 1 TABLET BY MOUTH THREE TIMES DAILY BEFORE MEALS AND AT BEDTIME 270 tablet 1  . famotidine (PEPCID) 20 MG tablet Take 1 tablet (20 mg total) by mouth 2 (two) times daily. 180 tablet 1  . fish oil-omega-3 fatty acids 1000 MG capsule Take 1 g by mouth daily.     . fluocinonide-emollient (LIDEX-E) 0.05 % cream Apply 1 application topically 2 (two) times daily. As  Directed. 30 g 0  . mirtazapine (REMERON) 15 MG tablet take 1 tablet by mouth at bedtime CAN INCREASE TO 2 TABLETS AT BEDTIME FOR SLEEP 180 tablet 1  .  Multiple Vitamin (MULTIVITAMIN) tablet Take 1 tablet by mouth daily.    . Probiotic Product ([employer] PROBIOTIC) CHEW Chew 2 capsules by mouth daily.    . rosuvastatin (CRESTOR) 5 MG tablet TAKE 1 TABLET(5 MG) BY MOUTH DAILY 90 tablet 1  . sertraline (ZOLOFT) 100 MG tablet Take 1 tablet (100 mg total) by mouth daily. 90 tablet 1  . zolpidem (AMBIEN) 10 MG tablet take 1/2 tablet by mouth at bedtime AVOID DAILY USE MAY TAKE ADDITIONAL 1/2 tablets if needed 90 tablet 0   No facility-administered medications prior to visit.      EXAM:  BP 110/60 (BP Location: Right Arm, Patient Position: Sitting, Cuff Size: Normal)   Pulse 84   Temp 98.6 F (37 C) (Temporal)   Ht 5\' 5"  (1.651 m)   Wt 151 lb 3.2 oz (68.6 kg)   SpO2 96%   BMI 25.16 kg/m   Body mass index is 25.16 kg/m.  GENERAL: vitals reviewed and listed above, alert, oriented, appears well hydrated and in no acute distress HEENT: atraumatic, conjunctiva  clear, no obvious abnormalities on inspection of external nose and ears   NECK: no obvious masses on inspection palpation  LUNGS: clear to auscultation bilaterally, no wheezes, rales or rhonchi, good air movement Abdomen:  Sof,t normal bowel sounds without hepatosplenomegaly, no guarding rebound or masses no CVA tenderness CV: HRRR, no clubbing cyanosis or  peripheral edema nl cap refill  MS: moves all extremities right  Grip  Contracture middle fingers   Ulnar deviation  Healing abrasion left elbow PSYCH: pleasant and cooperative, no obvious depression or anxiety Skin darker mole vs SK ant chest 3 mm Lab Results  Component Value Date   WBC 6.3 01/22/2019   HGB 13.5 01/22/2019   HCT 39.9 01/22/2019   PLT 188.0 01/22/2019   GLUCOSE 82 01/22/2019   CHOL 181 01/22/2019   TRIG 115.0 01/22/2019   HDL 74.90 01/22/2019   LDLCALC 83 01/22/2019   ALT 19  01/22/2019   AST 25 01/22/2019   NA 141 01/22/2019   K 4.1 01/22/2019   CL 102 01/22/2019   CREATININE 1.47 (H) 01/22/2019   BUN 32  (H) 01/22/2019   CO2 32 01/22/2019   TSH 3.73 12/27/2015   INR 1.00 06/29/2014   HGBA1C 6.3 01/22/2019   BP Readings from Last 3 Encounters:  05/19/19 110/60  07/22/18 108/60  07/21/18 137/74    ASSESSMENT AND PLAN:  Discussed the following assessment and plan:  INSOMNIA, CHRONIC  Medication management  Renal insufficiency  Hyperlipidemia, unspecified hyperlipidemia type  Chronic renal impairment, unspecified CKD stage  Osteoarthritis of multiple joints, unspecified osteoarthritis type  High risk medication use  Fasting hyperglycemia  Seronegative arthritis  BREAST CANCER, HX OF Stable at this time  Following renal function she  Has arthritis  prob oa and poss ra inflammatory but stable  And no concerns   Benefit more than risk of medications  to continue. But cautions reviewed  Pt aware high risk sleep meds  Plan rov in about 76 mos renal function at that time -Patient advised to return or notify health care team  if  new concerns arise.  Patient Instructions  Glad you are doing well .   Get dermatology to check the mole on chest.   ROV in about 6 months for kidney function test  Continued cautions  With the sleep medications .       Jeanne Tran M.D.

## 2019-05-19 ENCOUNTER — Ambulatory Visit (INDEPENDENT_AMBULATORY_CARE_PROVIDER_SITE_OTHER): Payer: Medicare Other | Admitting: Internal Medicine

## 2019-05-19 ENCOUNTER — Encounter: Payer: Self-pay | Admitting: Internal Medicine

## 2019-05-19 VITALS — BP 110/60 | HR 84 | Temp 98.6°F | Ht 65.0 in | Wt 151.2 lb

## 2019-05-19 DIAGNOSIS — G4700 Insomnia, unspecified: Secondary | ICD-10-CM

## 2019-05-19 DIAGNOSIS — E785 Hyperlipidemia, unspecified: Secondary | ICD-10-CM

## 2019-05-19 DIAGNOSIS — N189 Chronic kidney disease, unspecified: Secondary | ICD-10-CM | POA: Diagnosis not present

## 2019-05-19 DIAGNOSIS — Z853 Personal history of malignant neoplasm of breast: Secondary | ICD-10-CM | POA: Diagnosis not present

## 2019-05-19 DIAGNOSIS — M159 Polyosteoarthritis, unspecified: Secondary | ICD-10-CM | POA: Diagnosis not present

## 2019-05-19 DIAGNOSIS — R7301 Impaired fasting glucose: Secondary | ICD-10-CM | POA: Diagnosis not present

## 2019-05-19 DIAGNOSIS — Z79899 Other long term (current) drug therapy: Secondary | ICD-10-CM

## 2019-05-19 DIAGNOSIS — N289 Disorder of kidney and ureter, unspecified: Secondary | ICD-10-CM | POA: Diagnosis not present

## 2019-05-19 DIAGNOSIS — M1380 Other specified arthritis, unspecified site: Secondary | ICD-10-CM | POA: Diagnosis not present

## 2019-05-19 NOTE — Patient Instructions (Signed)
Glad you are doing well .   Get dermatology to check the mole on chest.   ROV in about 6 months for kidney function test  Continued cautions  With the sleep medications .

## 2019-05-25 DIAGNOSIS — D1801 Hemangioma of skin and subcutaneous tissue: Secondary | ICD-10-CM | POA: Diagnosis not present

## 2019-05-25 DIAGNOSIS — L570 Actinic keratosis: Secondary | ICD-10-CM | POA: Diagnosis not present

## 2019-05-25 DIAGNOSIS — L820 Inflamed seborrheic keratosis: Secondary | ICD-10-CM | POA: Diagnosis not present

## 2019-05-25 DIAGNOSIS — Z85828 Personal history of other malignant neoplasm of skin: Secondary | ICD-10-CM | POA: Diagnosis not present

## 2019-05-25 DIAGNOSIS — L821 Other seborrheic keratosis: Secondary | ICD-10-CM | POA: Diagnosis not present

## 2019-05-25 DIAGNOSIS — L718 Other rosacea: Secondary | ICD-10-CM | POA: Diagnosis not present

## 2019-06-02 DIAGNOSIS — Z23 Encounter for immunization: Secondary | ICD-10-CM | POA: Diagnosis not present

## 2019-06-24 ENCOUNTER — Other Ambulatory Visit: Payer: Self-pay | Admitting: Internal Medicine

## 2019-06-29 DIAGNOSIS — H16102 Unspecified superficial keratitis, left eye: Secondary | ICD-10-CM | POA: Diagnosis not present

## 2019-06-29 DIAGNOSIS — H0102A Squamous blepharitis right eye, upper and lower eyelids: Secondary | ICD-10-CM | POA: Diagnosis not present

## 2019-06-29 DIAGNOSIS — H04332 Acute lacrimal canaliculitis of left lacrimal passage: Secondary | ICD-10-CM | POA: Diagnosis not present

## 2019-06-29 DIAGNOSIS — H0102B Squamous blepharitis left eye, upper and lower eyelids: Secondary | ICD-10-CM | POA: Diagnosis not present

## 2019-06-29 DIAGNOSIS — Z961 Presence of intraocular lens: Secondary | ICD-10-CM | POA: Diagnosis not present

## 2019-06-29 DIAGNOSIS — H43812 Vitreous degeneration, left eye: Secondary | ICD-10-CM | POA: Diagnosis not present

## 2019-07-14 NOTE — Progress Notes (Signed)
[place]   Telephone:([phone]) [phone] Fax:(336) [phone]   Clinic Follow up Note   Patient Care Team: Panosh, Standley Brooking, MD as PCP - General Dyke Maes, OD (Optometry) Kary Kos, MD (Neurosurgery) [name], MD as Consulting Physician (General Surgery) Truitt Merle, MD as Consulting Physician (Hematology) Daryll Brod, MD as Consulting Physician (Orthopedic Surgery) Ladene Artist, MD as Consulting Physician (Gastroenterology) Warden Fillers, MD as Consulting Physician (Ophthalmology) Harriett Sine, MD as Consulting Physician (Dermatology)  Date of Service:  07/21/2019  CHIEF COMPLAINT: Follow up left breast DCIS   SUMMARY OF ONCOLOGIC HISTORY: Oncology History Overview Note  Diagnosed in 1995, s/p lumpectomy and 5 years Tamoxifen. Records not available    Ductal carcinoma in situ (DCIS) of left breast  1995 Cancer Diagnosis   History of left breast cancer, status post lumpectomy and 5 years tamoxifen.   06/08/2014 Initial Diagnosis   Cancer of left breast   06/08/2014 Initial Biopsy   Left breast biopsy showed DCIS.   07/01/2014 Surgery   Left breast lumpectomy, surgical margins were negative.   08/10/2014 - 07/2019 Anti-estrogen oral therapy   She started adjuvant tamoxifen, and was subsequently switched to anastrozole a few months later due to her anti-depression medications Zoloft. Completed 5 years in 07/2019.    06/2017 Mammogram   She had an abnormal breast US on 07/14/17 which showed a 47mm lesion in her left breast. She had a US guided biopsy on 07/16/17 which showed the mass to be benign fat necrosis. Her repeated mammogram and ultrasound in June 2019 was unremarkable.      CURRENT THERAPY:  Anastrozole 1 mg once daily, started in 07/2014. She is fine to stop after 07/2019  INTERVAL HISTORY:  Jeanne Tran is here for a follow up of left breast DCIS. She presents to the clinic alone. She was last seen by me 1 year ago. She  notes she is doing well. She is staying home during [place]. She denies any new changes since her last visit. She denied any pain currently. She notes she has still been taking anastrozole and tolerating well.     REVIEW OF SYSTEMS:  Constitutional: Denies fevers, chills or abnormal weight loss Eyes: Denies blurriness of vision Ears, nose, mouth, throat, and face: Denies mucositis or sore throat Respiratory: Denies cough, dyspnea or wheezes Cardiovascular: Denies palpitation, chest discomfort or lower extremity swelling Gastrointestinal:  Denies nausea, heartburn or change in bowel habits Skin: Denies abnormal skin rashes Lymphatics: Denies new lymphadenopathy or easy bruising Neurological:Denies numbness, tingling or new weaknesses Behavioral/Psych: Mood is stable, no new changes  All other systems were reviewed with the patient and are negative.  MEDICAL HISTORY:  Past Medical History:  Diagnosis Date  . Breast cancer (HCC)    hx of left with radiation and lumpectomy  . Diverticulosis   . GERD (gastroesophageal reflux disease)   . History of colonic polyps   . History of ERCP    and sphincterotomy for abnormal lfts and dilated biliary ducts 5/05  . Hyperlipidemia   . Migraine headache   . Pulmonary nodule   . RLS (restless legs syndrome)   . Wears contact lenses     SURGICAL HISTORY: Past Surgical History:  Procedure Laterality Date  . ABDOMINAL HYSTERECTOMY     bso  . APPENDECTOMY    . BREAST LUMPECTOMY  1995   lt lumpsnbx  . BREAST LUMPECTOMY WITH RADIOACTIVE SEED LOCALIZATION Left 07/01/2014   Procedure: LEFT BREAST LUMPECTOMY WITH RADIOACTIVE SEED  LOCALIZATION;  Surgeon: [name], MD;  Location: [facility];  Service: General;  Laterality: Left;  . CERVICAL SPINE SURGERY  3/03  . CHOLECYSTECTOMY  2012  . Complete Hysterectomy    . CYSTOCELE REPAIR    . ERCP     for abnormal lfts and dilated biliary ducts 5/05  . gallbladder duct open  2006   . HEMORRHOID SURGERY    . HYSTEROSCOPY    . LYMPH NODE BIOPSY    . Orthoscopic Rt Knee    . SPHINCTEROTOMY     for abnormal lfts and dilated biliary ducts 5/05  . tonsillectomy      I have reviewed the social history and family history with the patient and they are unchanged from previous note.  ALLERGIES:  is allergic to asa [aspirin] and codeine.  MEDICATIONS:  Current Outpatient Medications  Medication Sig Dispense Refill  . AMBIEN 10 MG tablet take 1/2 tablet by mouth at bedtime AVOID DAILY USE MAY TAKE ADDITIONAL 1/2 tablets if needed 90 tablet 0  . anastrozole (ARIMIDEX) 1 MG tablet Take 1 tablet (1 mg total) by mouth daily. 90 tablet 3  . BIOTIN PO Take 2,000 mcg by mouth daily.    Marland Kitchen CALCIUM PO Take 1,000 mg by mouth.    . cetirizine (ZYRTEC) 10 MG tablet Take 10 mg by mouth daily.    . Cholecalciferol (VITAMIN D3) 2000 UNITS TABS Take 1 tablet by mouth daily.    Marland Kitchen dicyclomine (BENTYL) 20 MG tablet TAKE 1 TABLET BY MOUTH THREE TIMES DAILY BEFORE MEALS AND AT BEDTIME 270 tablet 1  . famotidine (PEPCID) 20 MG tablet Take 1 tablet (20 mg total) by mouth 2 (two) times daily. 180 tablet 1  . fish oil-omega-3 fatty acids 1000 MG capsule Take 1 g by mouth daily.     . fluocinonide-emollient (LIDEX-E) 0.05 % cream Apply 1 application topically 2 (two) times daily. As  Directed. 30 g 0  . mirtazapine (REMERON) 15 MG tablet Take 1 to 2 tablets by mouth at bedtime for sleep 180 tablet 0  . Multiple Vitamin (MULTIVITAMIN) tablet Take 1 tablet by mouth daily.    . Probiotic Product (CVS PROBIOTIC) CHEW Chew 2 capsules by mouth daily.    . rosuvastatin (CRESTOR) 5 MG tablet TAKE 1 TABLET(5 MG) BY MOUTH DAILY 90 tablet 1  . sertraline (ZOLOFT) 100 MG tablet Take 1 tablet (100 mg total) by mouth daily. 90 tablet 1   No current facility-administered medications for this visit.     PHYSICAL EXAMINATION: ECOG PERFORMANCE STATUS: 1 - Symptomatic but completely ambulatory  Vitals:    07/21/19 1256  BP: (!) 147/82  Pulse: 68  Resp: 17  Temp: 97.8 F (36.6 C)  SpO2: 96%   Filed Weights   07/21/19 1256  Weight: 150 lb 9.6 oz (68.3 kg)    GENERAL:alert, no distress and comfortable SKIN: skin color, texture, turgor are normal, no rashes or significant lesions EYES: normal, Conjunctiva are pink and non-injected, sclera clear  NECK: supple, thyroid normal size, non-tender, without nodularity LYMPH:  no palpable lymphadenopathy in the cervical, axillary  LUNGS: clear to auscultation and percussion with normal breathing effort HEART: regular rate & rhythm and no murmurs and no lower extremity edema ABDOMEN:abdomen soft, non-tender and normal bowel sounds Musculoskeletal:no cyanosis of digits and no clubbing  NEURO: alert & oriented x 3 with fluent speech, no focal motor/sensory deficits BREAST: S/p left lumpectomies: Surgical incision healed well with mild scar tissue (+)  Left breast smaller and harder than right. No palpable mass, nodules or adenopathy bilaterally. Breast exam benign.   LABORATORY DATA:  I have reviewed the data as listed CBC Latest Ref Rng & Units 07/21/2019 01/22/2019 07/21/2018  WBC 4.0 - 10.5 K/uL 6.2 6.3 6.9  Hemoglobin 12.0 - 15.0 g/dL 12.5 13.5 12.4  Hematocrit 36.0 - 46.0 % 37.9 39.9 38.3  Platelets 150 - 400 K/uL 191 188.0 171     CMP Latest Ref Rng & Units 07/21/2019 01/22/2019 07/21/2018  Glucose 70 - 99 mg/dL 96 82 104(H)  BUN 8 - 23 mg/dL 33(H) 32(H) 31(H)  Creatinine 0.44 - 1.00 mg/dL 1.57(H) 1.47(H) 1.59(H)  Sodium 135 - 145 mmol/L 142 141 140  Potassium 3.5 - 5.1 mmol/L 4.3 4.1 4.1  Chloride 98 - 111 mmol/L 103 102 105  CO2 22 - 32 mmol/L 29 32 27  Calcium 8.9 - 10.3 mg/dL 10.3 9.8 9.9  Total Protein 6.5 - 8.1 g/dL 6.9 6.7 6.8  Total Bilirubin 0.3 - 1.2 mg/dL 0.3 0.5 0.2(L)  Alkaline Phos 38 - 126 U/L 112 94 113  AST 15 - 41 U/L 27 25 24   ALT 0 - 44 U/L 21 19 17       RADIOGRAPHIC STUDIES: I have personally reviewed the  radiological images as listed and agreed with the findings in the report. No results found.   ASSESSMENT & PLAN:  Jeanne Tran is a 85 y.o. female with   1. left breast DCIS, ER/PR positive -She has a h/o left breast cancer diagnosed in 1995, treated with left lumpectomy, RT and 5 years of Tamoxifen.  -She had Left breast DCIS diagnosed in 05/2014. She was treated with left lumpectomy. I started her on antiestrogen with Tamoxifen in 07/2014. Due to Zoloft interaction she was switched to anastrozole a few months later. She will complete her 5 years at the end of 07/2019.  -She is clinically doing well. Lab reviewed, her CBC and CMP are within normal limits except Cr 1.57. Her physical exam was unremarkable. There is no clinical concern for recurrence. -Continue surveillance. Next mammogram in 07/2019.  -Given she has completed 5 years of treatment and surveillance she is fine to be monitored by her PCP. She opted to continue surveillance with me. F/u yearly.  -She has had flu shot and first shingles shot this year.    2. Osteopenia -11/2014 DEXA showed osteopenia. -I previously discussed the option of adding bisphosphonate, she declined.  -07/14/2017 DEXA shows Osteopenia with lowest site at right femur neck with a T-Score of -1.50. Overall stable.  -I encouraged her to prevent risk for fall due to her increased risk of fracture. I suggest she start using a cane.    3. Depression -She takes Ambien and Mirtazapine for sleep. Mood well controlled on Zoloft. Will continue   Plan -Will complete anastrozole at the end of 07/2019  -Mammogram in 07/2019, she will call to schedule  -Lab and f/u with NP Lacie in 1 year    No problem-specific Assessment & Plan notes found for this encounter.   No orders of the defined types were placed in this encounter.  All questions were answered. The patient knows to call the clinic with any problems, questions or concerns. No barriers to  learning was detected. I spent 15 minutes counseling the patient face to face. The total time spent in the appointment was 20 minutes and more than 50% was on counseling and review of test results  Truitt Merle, MD 07/21/2019   I, Joslyn Devon, am acting as scribe for Truitt Merle, MD.   I have reviewed the above documentation for accuracy and completeness, and I agree with the above.

## 2019-07-21 ENCOUNTER — Inpatient Hospital Stay: Payer: Medicare Other | Attending: Hematology

## 2019-07-21 ENCOUNTER — Inpatient Hospital Stay (HOSPITAL_BASED_OUTPATIENT_CLINIC_OR_DEPARTMENT_OTHER): Payer: Medicare Other | Admitting: Hematology

## 2019-07-21 ENCOUNTER — Other Ambulatory Visit: Payer: Self-pay

## 2019-07-21 ENCOUNTER — Encounter: Payer: Self-pay | Admitting: Hematology

## 2019-07-21 VITALS — BP 147/82 | HR 68 | Temp 97.8°F | Resp 17 | Ht 65.0 in | Wt 150.6 lb

## 2019-07-21 DIAGNOSIS — Z86000 Personal history of in-situ neoplasm of breast: Secondary | ICD-10-CM | POA: Diagnosis not present

## 2019-07-21 DIAGNOSIS — Z853 Personal history of malignant neoplasm of breast: Secondary | ICD-10-CM | POA: Diagnosis not present

## 2019-07-21 DIAGNOSIS — F4322 Adjustment disorder with anxiety: Secondary | ICD-10-CM

## 2019-07-21 DIAGNOSIS — F329 Major depressive disorder, single episode, unspecified: Secondary | ICD-10-CM | POA: Diagnosis not present

## 2019-07-21 DIAGNOSIS — M8580 Other specified disorders of bone density and structure, unspecified site: Secondary | ICD-10-CM | POA: Diagnosis not present

## 2019-07-21 DIAGNOSIS — D0512 Intraductal carcinoma in situ of left breast: Secondary | ICD-10-CM

## 2019-07-21 DIAGNOSIS — E2839 Other primary ovarian failure: Secondary | ICD-10-CM

## 2019-07-21 LAB — COMPREHENSIVE METABOLIC PANEL
ALT: 21 U/L (ref 0–44)
AST: 27 U/L (ref 15–41)
Albumin: 4.1 g/dL (ref 3.5–5.0)
Alkaline Phosphatase: 112 U/L (ref 38–126)
Anion gap: 10 (ref 5–15)
BUN: 33 mg/dL — ABNORMAL HIGH (ref 8–23)
CO2: 29 mmol/L (ref 22–32)
Calcium: 10.3 mg/dL (ref 8.9–10.3)
Chloride: 103 mmol/L (ref 98–111)
Creatinine, Ser: 1.57 mg/dL — ABNORMAL HIGH (ref 0.44–1.00)
GFR calc Af Amer: 35 mL/min — ABNORMAL LOW (ref >60)
GFR calc non Af Amer: 30 mL/min — ABNORMAL LOW (ref >60)
Glucose, Bld: 96 mg/dL (ref 70–99)
Potassium: 4.3 mmol/L (ref 3.5–5.1)
Sodium: 142 mmol/L (ref 135–145)
Total Bilirubin: 0.3 mg/dL (ref 0.3–1.2)
Total Protein: 6.9 g/dL (ref 6.5–8.1)

## 2019-07-21 LAB — CBC WITH DIFFERENTIAL/PLATELET
Abs Immature Granulocytes: 0.01 10*3/uL (ref 0.00–0.07)
Basophils Absolute: 0.1 10*3/uL (ref 0.0–0.1)
Basophils Relative: 1 %
Eosinophils Absolute: 0.4 10*3/uL (ref 0.0–0.5)
Eosinophils Relative: 6 %
HCT: 37.9 % (ref 36.0–46.0)
Hemoglobin: 12.5 g/dL (ref 12.0–15.0)
Immature Granulocytes: 0 %
Lymphocytes Relative: 29 %
Lymphs Abs: 1.8 10*3/uL (ref 0.7–4.0)
MCH: 31.9 pg (ref 26.0–34.0)
MCHC: 33 g/dL (ref 30.0–36.0)
MCV: 96.7 fL (ref 80.0–100.0)
Monocytes Absolute: 0.7 10*3/uL (ref 0.1–1.0)
Monocytes Relative: 11 %
Neutro Abs: 3.3 10*3/uL (ref 1.7–7.7)
Neutrophils Relative %: 53 %
Platelets: 191 10*3/uL (ref 150–400)
RBC: 3.92 MIL/uL (ref 3.87–5.11)
RDW: 13.2 % (ref 11.5–15.5)
WBC: 6.2 10*3/uL (ref 4.0–10.5)
nRBC: 0 % (ref 0.0–0.2)

## 2019-07-22 ENCOUNTER — Telehealth: Payer: Self-pay | Admitting: Hematology

## 2019-07-22 NOTE — Telephone Encounter (Signed)
Appt was already scheduled.  Called pt and she is aware of the appt date and time,.

## 2019-07-28 ENCOUNTER — Encounter: Payer: Self-pay | Admitting: Hematology

## 2019-07-28 ENCOUNTER — Ambulatory Visit: Payer: Medicare Other

## 2019-07-28 DIAGNOSIS — Z853 Personal history of malignant neoplasm of breast: Secondary | ICD-10-CM | POA: Diagnosis not present

## 2019-07-28 LAB — HM MAMMOGRAPHY

## 2019-08-05 ENCOUNTER — Encounter: Payer: Self-pay | Admitting: Internal Medicine

## 2019-08-09 DIAGNOSIS — S0101XA Laceration without foreign body of scalp, initial encounter: Secondary | ICD-10-CM | POA: Diagnosis not present

## 2019-08-16 DIAGNOSIS — Z4802 Encounter for removal of sutures: Secondary | ICD-10-CM | POA: Diagnosis not present

## 2019-08-16 DIAGNOSIS — S0101XD Laceration without foreign body of scalp, subsequent encounter: Secondary | ICD-10-CM | POA: Diagnosis not present

## 2019-08-17 ENCOUNTER — Ambulatory Visit (INDEPENDENT_AMBULATORY_CARE_PROVIDER_SITE_OTHER): Payer: Medicare Other

## 2019-08-17 ENCOUNTER — Other Ambulatory Visit: Payer: Self-pay

## 2019-08-17 VITALS — BP 147/80 | Temp 98.0°F | Ht 65.0 in | Wt 150.0 lb

## 2019-08-17 DIAGNOSIS — Z0000 Encounter for general adult medical examination without abnormal findings: Secondary | ICD-10-CM

## 2019-08-17 NOTE — Progress Notes (Addendum)
This visit is being conducted via phone call due to the COVID-19 pandemic. This patient has given me verbal consent via phone to conduct this visit, patient states they are participating from their home address. Some vital signs may be absent or patient reported.   Patient identification: identified by name, DOB, and current address.  Location provider:  [facility], Office Persons participating in the virtual visit: Franne Forts, LPN and Ms. Hennessey Cantrell   Subjective:   Jeanne Tran is a 85 y.o. female who presents for Medicare Annual (Subsequent) preventive examination.  Review of Systems:  Cardiac Risk Factors include: advanced age (>61men, >50 women);dyslipidemia;sedentary lifestyle     Objective:     Vitals: BP (!) 147/80 Comment: patient reported  Temp 98 F (36.7 C) (Temporal)   Ht 5\' 5"  (1.651 m)   Wt 150 lb (68 kg)   BMI 24.96 kg/m   Body mass index is 24.96 kg/m.  Advanced Directives 08/17/2019 07/22/2018 11/06/2017 10/01/2016 03/22/2016 09/13/2015 05/09/2015  Does Patient Have a Medical Advance Directive? Yes Yes Yes Yes Yes No No  Type of Advance Directive [place];Living will - - -  Does patient want to make changes to medical advance directive? No - Patient declined - - - - - -  Copy of [place] in Chart? No - copy requested - No - copy requested Yes No - copy requested - -  Would patient like information on creating a medical advance directive? - - - - - No - patient declined information -    Tobacco Social History   Tobacco Use  Smoking Status Former Smoker  . Packs/day: 3.00  . Years: 25.00  . Pack years: 75.00  . Types: Cigarettes  . Quit date: 02/26/1994  . Years since quitting: 25.4  Smokeless Tobacco Never Used  Tobacco Comment   quit 24 years      Counseling given: Not Answered Comment: quit 24 years    Clinical Intake:  Pre-visit preparation completed: Yes   Pain : No/denies pain     Nutritional Status: BMI of 19-24  Normal Nutritional Risks: None Diabetes: No  How often do you need to have someone help you when you read instructions, pamphlets, or other written materials from your doctor or pharmacy?: 1 - Never  Interpreter Needed?: No  Information entered by :: Franne Forts, LPN.  Past Medical History:  Diagnosis Date  . Breast cancer (HCC)    hx of left with radiation and lumpectomy  . Diverticulosis   . GERD (gastroesophageal reflux disease)   . History of colonic polyps   . History of ERCP    and sphincterotomy for abnormal lfts and dilated biliary ducts 5/05  . Hyperlipidemia   . Migraine headache   . Pulmonary nodule   . RLS (restless legs syndrome)   . Wears contact lenses    Past Surgical History:  Procedure Laterality Date  . ABDOMINAL HYSTERECTOMY     bso  . APPENDECTOMY    . BREAST LUMPECTOMY  1995   lt lumpsnbx  . BREAST LUMPECTOMY WITH RADIOACTIVE SEED LOCALIZATION Left 07/01/2014   Procedure: LEFT BREAST LUMPECTOMY WITH RADIOACTIVE SEED LOCALIZATION;  Surgeon: [name], MD;  Location: [facility];  Service: General;  Laterality: Left;  . CERVICAL SPINE SURGERY  3/03  . CHOLECYSTECTOMY  2012  . Complete Hysterectomy    . CYSTOCELE REPAIR    . ERCP     for  abnormal lfts and dilated biliary ducts 5/05  . gallbladder duct open  2006  . HEMORRHOID SURGERY    . HYSTEROSCOPY    . LYMPH NODE BIOPSY    . Orthoscopic Rt Knee    . SPHINCTEROTOMY     for abnormal lfts and dilated biliary ducts 5/05  . tonsillectomy     Family History  Problem Relation Age of Onset  . Mitral valve prolapse Son        with afib to have surgery   Social History   Socioeconomic History  . Marital status: Widowed    Spouse name: Not on file  . Number of children: 3  . Years of education: Not on file  . Highest education level: Not on file  Occupational History  . Not on file  Social Needs  .  Financial resource strain: Not very hard  . Food insecurity    Worry: Never true    Inability: Never true  . Transportation needs    Medical: No    Non-medical: No  Tobacco Use  . Smoking status: Former Smoker    Packs/day: 3.00    Years: 25.00    Pack years: 75.00    Types: Cigarettes    Quit date: 02/26/1994    Years since quitting: 25.4  . Smokeless tobacco: Never Used  . Tobacco comment: quit 24 years   Substance and Sexual Activity  . Alcohol use: No  . Drug use: No  . Sexual activity: Not on file  Lifestyle  . Physical activity    Days per week: 0 days    Minutes per session: 0 min  . Stress: Not on file  Relationships  . Social Herbalist on phone: Not on file    Gets together: Not on file    Attends religious service: Not on file    Active member of club or organization: Yes    Attends meetings of clubs or organizations: More than 4 times per year    Relationship status: Widowed  Other Topics Concern  . Not on file  Social History Narrative   Retired; widowed   2 sons   Plays bridge twice monthly   Still drives          Outpatient Encounter Medications as of 08/17/2019  Medication Sig  . AMBIEN 10 MG tablet take 1/2 tablet by mouth at bedtime AVOID DAILY USE MAY TAKE ADDITIONAL 1/2 tablets if needed  . BIOTIN PO Take 2,000 mcg by mouth daily.  Marland Kitchen CALCIUM PO Take 1,000 mg by mouth.  . cetirizine (ZYRTEC) 10 MG tablet Take 10 mg by mouth daily.  . Cholecalciferol (VITAMIN D3) 2000 UNITS TABS Take 1 tablet by mouth daily.  Marland Kitchen dicyclomine (BENTYL) 20 MG tablet TAKE 1 TABLET BY MOUTH THREE TIMES DAILY BEFORE MEALS AND AT BEDTIME  . famotidine (PEPCID) 20 MG tablet Take 1 tablet (20 mg total) by mouth 2 (two) times daily.  . fish oil-omega-3 fatty acids 1000 MG capsule Take 1 g by mouth daily.   . mirtazapine (REMERON) 15 MG tablet Take 1 to 2 tablets by mouth at bedtime for sleep  . Multiple Vitamin (MULTIVITAMIN) tablet Take 1 tablet by mouth daily.   . Probiotic Product (CVS PROBIOTIC) CHEW Chew 2 capsules by mouth daily.  . rosuvastatin (CRESTOR) 5 MG tablet TAKE 1 TABLET(5 MG) BY MOUTH DAILY  . sertraline (ZOLOFT) 100 MG tablet Take 1 tablet (100 mg total) by mouth daily.  Marland Kitchen anastrozole (  ARIMIDEX) 1 MG tablet Take 1 tablet (1 mg total) by mouth daily. (Patient not taking: Reported on 08/17/2019)  . fluocinonide-emollient (LIDEX-E) 0.05 % cream Apply 1 application topically 2 (two) times daily. As  Directed. (Patient not taking: Reported on 08/17/2019)   No facility-administered encounter medications on file as of 08/17/2019.     Activities of Daily Living In your present state of health, do you have any difficulty performing the following activities: 08/17/2019  Hearing? Y  Vision? N  Difficulty concentrating or making decisions? N  Walking or climbing stairs? N  Dressing or bathing? N  Doing errands, shopping? N  Preparing Food and eating ? N  Using the Toilet? N  In the past six months, have you accidently leaked urine? Y  Do you have problems with loss of bowel control? Y  Managing your Medications? N  Managing your Finances? N  Housekeeping or managing your Housekeeping? N  Some recent data might be hidden    Patient Care Team: Panosh, Standley Brooking, MD as PCP - General Dyke Maes, OD (Optometry) Kary Kos, MD (Neurosurgery) [name], MD as Consulting Physician (General Surgery) Truitt Merle, MD as Consulting Physician (Hematology) Daryll Brod, MD as Consulting Physician (Orthopedic Surgery) Ladene Artist, MD as Consulting Physician (Gastroenterology) Warden Fillers, MD as Consulting Physician (Ophthalmology) Harriett Sine, MD as Consulting Physician (Dermatology)    Assessment:   This is a routine wellness examination for Leonidas.  Exercise Activities and Dietary recommendations Current Exercise Habits: The patient does not participate in regular exercise at present, Exercise limited by: None  identified  Goals    . Increase physical activity     Recommend starting with light exercise in home and walking outside when able.     . Patient Stated     Try to control the diarrhea; discuss with Dr. Mal Amabile 3 times as opposed to 4 times because it is making her drowsy  May feel better with med adjustment  Drink plenty of fluids     . Reduce fat intake to X grams per day     Drinks a lot of water -  Decrease sweet and fat intake        Fall Risk Fall Risk  08/17/2019 05/19/2019 08/05/2018 07/22/2018 04/17/2018  Falls in the past year? 0 0 1 1 Yes  Comment - - Emmi Telephone Survey: data to providers prior to load fell yesterday; putting up Halloween things; slipped  Emmi Telephone Survey: data to providers prior to load  Number falls in past yr: - 0 1 0 1  Comment - - Emmi Telephone Survey Actual Response = 1 - Emmi Telephone Survey Actual Response = 1  Injury with Fall? - 0 0 - No  Risk for fall due to : Medication side effect - - (No Data) -  Risk for fall due to: Comment - - - slipped  -  Follow up Falls evaluation completed;Education provided;Falls prevention discussed Falls evaluation completed - Education provided -   Is the patient's home free of loose throw rugs in walkways, pet beds, electrical cords, etc?   yes      Grab bars in the bathroom? yes      Handrails on the stairs?   yes; patient does not have any stairs      Adequate lighting?   yes  Timed Get Up and Go performed: Unable to perform due to telephone visit  Depression Screen [facility] 2/9 Scores 08/17/2019 05/19/2019 07/22/2018 12/10/2016  PHQ -  2 Score 0 0 0 0     Cognitive Function MMSE - Mini Mental State Exam 07/22/2018 12/10/2016  Not completed: (No Data) (No Data)     6CIT Screen 08/17/2019  What Year? 0 points  What month? 0 points  What time? 0 points  Count back from 20 0 points  Months in reverse 0 points  Repeat phrase 0 points  Total Score 0    Immunization History  Administered Date(s)  Administered  . Fluad Quad(high Dose 65+) 06/02/2019  . H1N1 08/29/2008  . Influenza Whole 09/16/1997, 05/16/2012  . Influenza, High Dose Seasonal PF 06/16/2014, 05/20/2017, 06/05/2018  . Influenza,inj,Quad PF,6+ Mos 05/18/2013, 05/15/2015, 05/29/2016  . Influenza-Unspecified 05/20/2017  . Pneumococcal Conjugate-13 12/14/2013  . Pneumococcal-Unspecified 10/28/2000  . Td 09/16/2001, 12/14/2013  . Tetanus 12/14/2013  . Zoster 05/29/2016  . Zoster Recombinat (Shingrix) 06/17/2019    Qualifies for Shingles Vaccine?Yes; patient to obtain Shingrix #2 soon  Screening Tests Health Maintenance  Topic Date Due  . INFLUENZA VACCINE  12/15/2019 (Originally 04/17/2019)  . MAMMOGRAM  07/27/2021  . TETANUS/TDAP  12/15/2023  . DEXA SCAN  Completed  . PNA vac Low Risk Adult  Completed    Cancer Screenings: Lung: Low Dose CT Chest recommended if Age 40-80 years, 30 pack-year currently smoking OR have quit w/in 15years. Patient does not qualify. Breast:  Up to date on Mammogram? Yes   Up to date of Bone Density/Dexa? Yes Colorectal: yes  Additional Screenings:   Hepatitis C Screening: Not needed     Plan:   Ms. Ancona, Please bring a copy of your healthcare power of attorney to your next appointment and obtain shingles vaccine #2 at your pharmacy.    I have personally reviewed and noted the following in the patient's chart:   . Medical and social history . Use of alcohol, tobacco or illicit drugs  . Current medications and supplements . Functional ability and status . Nutritional status . Physical activity . Advanced directives . List of other physicians . Hospitalizations, surgeries, and ER visits in previous 12 months . Vitals . Screenings to include cognitive, depression, and falls . Referrals and appointments  In addition, I have reviewed and discussed with patient certain preventive protocols, quality metrics, and best practice recommendations. A written personalized care plan  for preventive services as well as general preventive health recommendations were provided to patient.     Franne Forts, LPN  [id number]  Above noted reviewed and agree. Shanon Ace, MD

## 2019-08-17 NOTE — Patient Instructions (Addendum)
Jeanne Tran , Thank you for taking time to come for your Medicare Wellness Visit. I appreciate your ongoing commitment to your health goals. Please review the following plan we discussed and let me know if I can assist you in the future.   Screening recommendations/referrals: Colorectal Screening: performed 05/25/2013; up to date  Mammogram: performed 07/28/2019; up to date Bone Density: performed 07/14/2017; up to date  Vision and Dental Exams: Recommended annual ophthalmology exams for early detection of glaucoma and other disorders of the eye Recommended annual dental exams for proper oral hygiene  Diabetic Exams: Diabetic Eye Exam: N/A Diabetic Foot Exam: N/A  Vaccinations: Influenza vaccine: up to date; 06/02/2019  Pneumococcal vaccine: up to date; 12/14/2013 and 10/28/2000; up to date Tdap vaccine: up to date; had 12/14/2013; due again 12/15/2023 Shingles vaccine: up to date; had shingrix #1  06/17/2019 and will be getting shingrix #2 soon at local pharmacy.  Advanced directives: Advance directives discussed with you today.  Please bring a copy of your POA (Power of [place]) and/or Living Will to your next appointment.  Goals: Recommend to drink at least 6-8 8oz glasses of water per day. Recommend to exercise for at least 150 minutes per week. Recommend to remove any items from the home that may cause slips or trips.  Next appointment: Please schedule your Annual Wellness Visit with your Nurse Health Advisor in one year.  Preventive Care 83 Years and Older, Female Preventive care refers to lifestyle choices and visits with your health care provider that can promote health and wellness. What does preventive care include?  A yearly physical exam. This is also called an annual well check.  Dental exams once or twice a year.  Routine eye exams. Ask your health care provider how often you should have your eyes checked.  Personal lifestyle choices, including:  Daily care of your  teeth and gums.  Regular physical activity.  Eating a healthy diet.  Avoiding tobacco and drug use.  Limiting alcohol use.  Practicing safe sex.  Taking low-dose aspirin every day if recommended by your health care provider.  Taking vitamin and mineral supplements as recommended by your health care provider. What happens during an annual well check? The services and screenings done by your health care provider during your annual well check will depend on your age, overall health, lifestyle risk factors, and family history of disease. Counseling  Your health care provider may ask you questions about your:  Alcohol use.  Tobacco use.  Drug use.  Emotional well-being.  Home and relationship well-being.  Sexual activity.  Eating habits.  History of falls.  Memory and ability to understand (cognition).  Work and work Statistician.  Reproductive health. Screening  You may have the following tests or measurements:  Height, weight, and BMI.  Blood pressure.  Lipid and cholesterol levels. These may be checked every 5 years, or more frequently if you are over 70 years old.  Skin check.  Lung cancer screening. You may have this screening every year starting at age 83 if you have a 30-pack-year history of smoking and currently smoke or have quit within the past 15 years.  Fecal occult blood test (FOBT) of the stool. You may have this test every year starting at age 83.  Flexible sigmoidoscopy or colonoscopy. You may have a sigmoidoscopy every 5 years or a colonoscopy every 10 years starting at age 83.  Hepatitis C blood test.  Hepatitis B blood test.  Sexually transmitted disease (STD) testing.  Diabetes screening. This is done by checking your blood sugar (glucose) after you have not eaten for a while (fasting). You may have this done every 1-3 years.  Bone density scan. This is done to screen for osteoporosis. You may have this done starting at age  83.  Mammogram. This may be done every 1-2 years. Talk to your health care provider about how often you should have regular mammograms. Talk with your health care provider about your test results, treatment options, and if necessary, the need for more tests. Vaccines  Your health care provider may recommend certain vaccines, such as:  Influenza vaccine. This is recommended every year.  Tetanus, diphtheria, and acellular pertussis (Tdap, Td) vaccine. You may need a Td booster every 10 years.  Zoster vaccine. You may need this after age 83.  Pneumococcal 13-valent conjugate (PCV13) vaccine. One dose is recommended after age 1.  Pneumococcal polysaccharide (PPSV23) vaccine. One dose is recommended after age 83. Talk to your health care provider about which screenings and vaccines you need and how often you need them. This information is not intended to replace advice given to you by your health care provider. Make sure you discuss any questions you have with your health care provider. Document Released: 09/29/2015 Document Revised: 05/22/2016 Document Reviewed: 07/04/2015 Elsevier Interactive Patient Education  2017 [place] Prevention in the Home Falls can cause injuries. They can happen to people of all ages. There are many things you can do to make your home safe and to help prevent falls. What can I do on the outside of my home?  Regularly fix the edges of walkways and driveways and fix any cracks.  Remove anything that might make you trip as you walk through a door, such as a raised step or threshold.  Trim any bushes or trees on the path to your home.  Use bright outdoor lighting.  Clear any walking paths of anything that might make someone trip, such as rocks or tools.  Regularly check to see if handrails are loose or broken. Make sure that both sides of any steps have handrails.  Any raised decks and porches should have guardrails on the edges.  Have any  leaves, snow, or ice cleared regularly.  Use sand or salt on walking paths during winter.  Clean up any spills in your garage right away. This includes oil or grease spills. What can I do in the bathroom?  Use night lights.  Install grab bars by the toilet and in the tub and shower. Do not use towel bars as grab bars.  Use non-skid mats or decals in the tub or shower.  If you need to sit down in the shower, use a plastic, non-slip stool.  Keep the floor dry. Clean up any water that spills on the floor as soon as it happens.  Remove soap buildup in the tub or shower regularly.  Attach bath mats securely with double-sided non-slip rug tape.  Do not have throw rugs and other things on the floor that can make you trip. What can I do in the bedroom?  Use night lights.  Make sure that you have a light by your bed that is easy to reach.  Do not use any sheets or blankets that are too big for your bed. They should not hang down onto the floor.  Have a firm chair that has side arms. You can use this for support while you get dressed.  Do not have throw  rugs and other things on the floor that can make you trip. What can I do in the kitchen?  Clean up any spills right away.  Avoid walking on wet floors.  Keep items that you use a lot in easy-to-reach places.  If you need to reach something above you, use a strong step stool that has a grab bar.  Keep electrical cords out of the way.  Do not use floor polish or wax that makes floors slippery. If you must use wax, use non-skid floor wax.  Do not have throw rugs and other things on the floor that can make you trip. What can I do with my stairs?  Do not leave any items on the stairs.  Make sure that there are handrails on both sides of the stairs and use them. Fix handrails that are broken or loose. Make sure that handrails are as long as the stairways.  Check any carpeting to make sure that it is firmly attached to the stairs.  Fix any carpet that is loose or worn.  Avoid having throw rugs at the top or bottom of the stairs. If you do have throw rugs, attach them to the floor with carpet tape.  Make sure that you have a light switch at the top of the stairs and the bottom of the stairs. If you do not have them, ask someone to add them for you. What else can I do to help prevent falls?  Wear shoes that:  Do not have high heels.  Have rubber bottoms.  Are comfortable and fit you well.  Are closed at the toe. Do not wear sandals.  If you use a stepladder:  Make sure that it is fully opened. Do not climb a closed stepladder.  Make sure that both sides of the stepladder are locked into place.  Ask someone to hold it for you, if possible.  Clearly mark and make sure that you can see:  Any grab bars or handrails.  First and last steps.  Where the edge of each step is.  Use tools that help you move around (mobility aids) if they are needed. These include:  Canes.  Walkers.  Scooters.  Crutches.  Turn on the lights when you go into a dark area. Replace any light bulbs as soon as they burn out.  Set up your furniture so you have a clear path. Avoid moving your furniture around.  If any of your floors are uneven, fix them.  If there are any pets around you, be aware of where they are.  Review your medicines with your doctor. Some medicines can make you feel dizzy. This can increase your chance of falling. Ask your doctor what other things that you can do to help prevent falls. This information is not intended to replace advice given to you by your health care provider. Make sure you discuss any questions you have with your health care provider. Document Released: 06/29/2009 Document Revised: 02/08/2016 Document Reviewed: 10/07/2014 Elsevier Interactive Patient Education  2017 [employer].

## 2019-08-25 ENCOUNTER — Other Ambulatory Visit: Payer: Self-pay | Admitting: Internal Medicine

## 2019-08-25 NOTE — Telephone Encounter (Signed)
Last ov:05/19/2019 Last filled:12/11/2018

## 2019-08-31 ENCOUNTER — Telehealth: Payer: Self-pay | Admitting: Internal Medicine

## 2019-08-31 NOTE — Telephone Encounter (Signed)
Message Routed to PCP CMA 

## 2019-08-31 NOTE — Telephone Encounter (Signed)
Pt was advised by the pharmacy that her insurance doesn't recognize the Rx zolpidem (AMBIEN) 10 MG tablet Pt asked if there was something that needs to be done for them to recognize this RX or if something else needs to be sent to pharmacy. Please call Pt to advise

## 2019-09-01 NOTE — Telephone Encounter (Signed)
Tried to do PA but do to Pt only having Part A and B they will not cover it. Pt has been notified

## 2019-09-08 ENCOUNTER — Other Ambulatory Visit: Payer: Self-pay | Admitting: Internal Medicine

## 2019-09-09 ENCOUNTER — Other Ambulatory Visit: Payer: Self-pay | Admitting: Hematology

## 2019-09-09 ENCOUNTER — Other Ambulatory Visit: Payer: Self-pay | Admitting: Internal Medicine

## 2019-09-09 DIAGNOSIS — F4322 Adjustment disorder with anxiety: Secondary | ICD-10-CM

## 2019-10-25 ENCOUNTER — Other Ambulatory Visit: Payer: Self-pay | Admitting: Internal Medicine

## 2019-12-01 ENCOUNTER — Telehealth: Payer: Self-pay | Admitting: Internal Medicine

## 2019-12-01 NOTE — Telephone Encounter (Signed)
Pt would like to let Dr. Regis Bill know that she has had both of her COVID vaccines.  11/09/2019  1st vaccine             11/30/2019 2nd vaccine    Type:  Pfizer  Location: at the [facility])

## 2019-12-02 ENCOUNTER — Other Ambulatory Visit: Payer: Self-pay

## 2019-12-02 NOTE — Telephone Encounter (Signed)
Pt called back, advised to bring vaccine card to appt so chart can be updated.

## 2019-12-02 NOTE — Telephone Encounter (Signed)
Left message to return phone call.

## 2019-12-03 ENCOUNTER — Ambulatory Visit (INDEPENDENT_AMBULATORY_CARE_PROVIDER_SITE_OTHER): Payer: Medicare Other | Admitting: Internal Medicine

## 2019-12-03 ENCOUNTER — Encounter: Payer: Self-pay | Admitting: Internal Medicine

## 2019-12-03 VITALS — BP 120/80 | HR 76 | Temp 98.1°F | Ht 65.0 in | Wt 150.0 lb

## 2019-12-03 DIAGNOSIS — Z79899 Other long term (current) drug therapy: Secondary | ICD-10-CM | POA: Diagnosis not present

## 2019-12-03 DIAGNOSIS — M1380 Other specified arthritis, unspecified site: Secondary | ICD-10-CM

## 2019-12-03 DIAGNOSIS — G4700 Insomnia, unspecified: Secondary | ICD-10-CM

## 2019-12-03 DIAGNOSIS — N289 Disorder of kidney and ureter, unspecified: Secondary | ICD-10-CM

## 2019-12-03 DIAGNOSIS — N189 Chronic kidney disease, unspecified: Secondary | ICD-10-CM | POA: Diagnosis not present

## 2019-12-03 DIAGNOSIS — M159 Polyosteoarthritis, unspecified: Secondary | ICD-10-CM

## 2019-12-03 LAB — BASIC METABOLIC PANEL
BUN: 26 mg/dL — ABNORMAL HIGH (ref 6–23)
CO2: 30 mEq/L (ref 19–32)
Calcium: 9.7 mg/dL (ref 8.4–10.5)
Chloride: 104 mEq/L (ref 96–112)
Creatinine, Ser: 1.6 mg/dL — ABNORMAL HIGH (ref 0.40–1.20)
GFR: 30.7 mL/min — ABNORMAL LOW (ref >60.00)
Glucose, Bld: 92 mg/dL (ref 70–99)
Potassium: 4.9 mEq/L (ref 3.5–5.1)
Sodium: 140 mEq/L (ref 135–145)

## 2019-12-03 MED ORDER — AMBIEN 10 MG PO TABS: ORAL_TABLET | ORAL | 0 refills | Status: DC

## 2019-12-03 NOTE — Progress Notes (Signed)
Kidney function  is about the same   (decrease  by about 1/2)  will follow

## 2019-12-03 NOTE — Patient Instructions (Addendum)
Will refill ambien as  We discussed  Continued precuations.  Will notify you  of labs when available.   If all ok then    6-7 months  ROV

## 2019-12-03 NOTE — Progress Notes (Signed)
This visit occurred during the SARS-CoV-2 public health emergency.  Safety protocols were in place, including screening questions prior to the visit, additional usage of staff PPE, and extensive cleaning of exam room while observing appropriate contact time as indicated for disinfecting solutions.    Chief Complaint  Patient presents with  . Medication Management    HPI: Jeanne Tran 86 y.o. come in for Chronic disease management  Sleep using Remeron 15 and 5 ambien branded works better still helps and reports no memory falling  Se issues at this time  ( has tried many things in past and this seems to work best) no falling   Hand arthritis right deforming but not that painful and  Doesn't have to take pain med but aevery fre months  Mood stable  Plays bridge     Continue sertraline ocass right leg feels hot under blankets but no weakness  ROS: See pertinent positives and negatives per HPI. Hearing vision stable Had covid vaccine and did well   Past Medical History:  Diagnosis Date  . Breast cancer (HCC)    hx of left with radiation and lumpectomy  . Diverticulosis   . GERD (gastroesophageal reflux disease)   . History of colonic polyps   . History of ERCP    and sphincterotomy for abnormal lfts and dilated biliary ducts 5/05  . Hyperlipidemia   . Migraine headache   . Pulmonary nodule   . RLS (restless legs syndrome)   . Wears contact lenses     Family History  Problem Relation Age of Onset  . Mitral valve prolapse Son        with afib to have surgery    Social History   Socioeconomic History  . Marital status: Widowed    Spouse name: Not on file  . Number of children: 3  . Years of education: Not on file  . Highest education level: Not on file  Occupational History  . Not on file  Tobacco Use  . Smoking status: Former Smoker    Packs/day: 3.00    Years: 25.00    Pack years: 75.00    Types: Cigarettes    Quit date: 02/26/1994    Years since quitting:  25.7  . Smokeless tobacco: Never Used  . Tobacco comment: quit 24 years   Substance and Sexual Activity  . Alcohol use: No  . Drug use: No  . Sexual activity: Not on file  Other Topics Concern  . Not on file  Social History Narrative   Retired; widowed   2 sons   Plays bridge twice monthly   Still drives         Social Determinants of Health   Financial Resource Strain: Low Risk   . Difficulty of Paying Living Expenses: Not very hard  Food Insecurity: No Food Insecurity  . Worried About Charity fundraiser in the Last Year: Never true  . Ran Out of Food in the Last Year: Never true  Transportation Needs: No Transportation Needs  . Lack of Transportation (Medical): No  . Lack of Transportation (Non-Medical): No  Physical Activity: Inactive  . Days of Exercise per Week: 0 days  . Minutes of Exercise per Session: 0 min  Stress:   . Feeling of Stress :   Social Connections: Unknown  . Frequency of Communication with Friends and Family: Not on file  . Frequency of Social Gatherings with Friends and Family: Not on file  . Attends Religious Services: Not  on file  . Active Member of Clubs or Organizations: Yes  . Attends Archivist Meetings: More than 4 times per year  . Marital Status: Widowed    Outpatient Medications Prior to Visit  Medication Sig Dispense Refill  . anastrozole (ARIMIDEX) 1 MG tablet Take 1 tablet (1 mg total) by mouth daily. 90 tablet 3  . BIOTIN PO Take 2,000 mcg by mouth daily.    Marland Kitchen CALCIUM PO Take 1,000 mg by mouth.    . cetirizine (ZYRTEC) 10 MG tablet Take 10 mg by mouth daily.    . Cholecalciferol (VITAMIN D3) 2000 UNITS TABS Take 1 tablet by mouth daily.    Marland Kitchen dicyclomine (BENTYL) 20 MG tablet TAKE 1 TABLET BY MOUTH THREE TIMES DAILY BEFORE MEALS AND AT BEDTIME 270 tablet 1  . famotidine (PEPCID) 20 MG tablet Take 1 tablet (20 mg total) by mouth 2 (two) times daily. 180 tablet 1  . fish oil-omega-3 fatty acids 1000 MG capsule Take 1 g by  mouth daily.     . fluocinonide-emollient (LIDEX-E) 0.05 % cream Apply 1 application topically 2 (two) times daily. As  Directed. 30 g 0  . mirtazapine (REMERON) 15 MG tablet Take 1 to 2 tablets by mouth at bedtime for sleep 180 tablet 0  . moxifloxacin (VIGAMOX) 0.5 % ophthalmic solution moxifloxacin 0.5 % eye drops    . Multiple Vitamin (MULTIVITAMIN) tablet Take 1 tablet by mouth daily.    . Probiotic Product ([employer] PROBIOTIC) CHEW Chew 2 capsules by mouth daily.    . rosuvastatin (CRESTOR) 5 MG tablet TAKE 1 TABLET(5 MG) BY MOUTH DAILY 90 tablet 1  . sertraline (ZOLOFT) 100 MG tablet TAKE 1 TABLET(100 MG) BY MOUTH DAILY 90 tablet 1  . SHINGRIX injection     . zolpidem (AMBIEN) 10 MG tablet TAKE 1/2 TABLET BY MOUTH AT BEDTIME, AVOID DAILY USE. MAY TAKE ADDITIONAL 1/2 TABLET IF NEEDED. 90 tablet 0   No facility-administered medications prior to visit.     EXAM:  BP 120/80   Pulse 76   Temp 98.1 F (36.7 C) (Other (Comment))   Ht 5\' 5"  (1.651 m)   Wt 150 lb (68 kg)   SpO2 95%   BMI 24.96 kg/m   Body mass index is 24.96 kg/m. Wt Readings from Last 3 Encounters:  12/03/19 150 lb (68 kg)  08/17/19 150 lb (68 kg)  07/21/19 150 lb 9.6 oz (68.3 kg)    GENERAL: vitals reviewed and listed above, alert, oriented, appears well hydrated and in no acute distress HEENT: atraumatic, conjunctiva  clear, no obvious abnormalities on inspection of external nose and ears OP : masked  NECK: no obvious masses on inspection palpation  LUNGS: clear to auscultation bilaterally, no wheezes, rales or rhonchi,  CV: HRRR, no clubbing cyanosis or  peripheral edema nl cap refill  MS: moves all extremities   right hand deformity some contracture fingers  Left better  No acute swelling  PSYCH: pleasant and cooperative, no obvious depression or anxiety Up and walk from chair very good pace and balance  Lab Results  Component Value Date   WBC 6.2 07/21/2019   HGB 12.5 07/21/2019   HCT 37.9 07/21/2019    PLT 191 07/21/2019   GLUCOSE 96 07/21/2019   CHOL 181 01/22/2019   TRIG 115.0 01/22/2019   HDL 74.90 01/22/2019   LDLCALC 83 01/22/2019   ALT 21 07/21/2019   AST 27 07/21/2019   NA 142 07/21/2019   K 4.3  07/21/2019   CL 103 07/21/2019   CREATININE 1.57 (H) 07/21/2019   BUN 33 (H) 07/21/2019   CO2 29 07/21/2019   TSH 3.73 12/27/2015   INR 1.00 06/29/2014   HGBA1C 6.3 01/22/2019   BP Readings from Last 3 Encounters:  12/03/19 120/80  08/17/19 (!) 147/80  07/21/19 (!) 147/82    ASSESSMENT AND PLAN:  Discussed the following assessment and plan:  Medication management - Plan: Basic metabolic panel  Renal insufficiency - Plan: Basic metabolic panel  INSOMNIA, CHRONIC  Seronegative arthritis  Chronic renal impairment, unspecified CKD stage - Plan: Basic metabolic panel  Osteoarthritis of multiple joints, unspecified osteoarthritis type  High risk medication use  Med review rov in  Sept October  Or thereabouts  Oncology and  wellness -Patient advised to return or notify health care team  if  new concerns arise. In interim  Outside external source  DATA REVIEWED:  Total time on date  of service including record review ordering and plan of care:  32 minutes   Patient Instructions  Will refill ambien as  We discussed  Continued precuations.  Will notify you  of labs when available.   If all ok then    6-7 months  [place]. Sarh Kirschenbaum M.D.

## 2020-03-23 ENCOUNTER — Other Ambulatory Visit: Payer: Self-pay | Admitting: Internal Medicine

## 2020-03-23 ENCOUNTER — Other Ambulatory Visit: Payer: Self-pay | Admitting: Hematology

## 2020-03-23 DIAGNOSIS — F4322 Adjustment disorder with anxiety: Secondary | ICD-10-CM

## 2020-04-30 DIAGNOSIS — R399 Unspecified symptoms and signs involving the genitourinary system: Secondary | ICD-10-CM | POA: Diagnosis not present

## 2020-04-30 DIAGNOSIS — M069 Rheumatoid arthritis, unspecified: Secondary | ICD-10-CM | POA: Insufficient documentation

## 2020-04-30 DIAGNOSIS — R34 Anuria and oliguria: Secondary | ICD-10-CM | POA: Diagnosis not present

## 2020-05-01 ENCOUNTER — Encounter: Payer: Self-pay | Admitting: Internal Medicine

## 2020-05-01 ENCOUNTER — Ambulatory Visit (INDEPENDENT_AMBULATORY_CARE_PROVIDER_SITE_OTHER): Payer: Medicare Other | Admitting: Internal Medicine

## 2020-05-01 ENCOUNTER — Other Ambulatory Visit: Payer: Self-pay

## 2020-05-01 VITALS — BP 128/76 | HR 70 | Temp 98.4°F | Ht 65.0 in | Wt 147.8 lb

## 2020-05-01 DIAGNOSIS — N189 Chronic kidney disease, unspecified: Secondary | ICD-10-CM | POA: Diagnosis not present

## 2020-05-01 DIAGNOSIS — N1832 Chronic kidney disease, stage 3b: Secondary | ICD-10-CM

## 2020-05-01 DIAGNOSIS — M8580 Other specified disorders of bone density and structure, unspecified site: Secondary | ICD-10-CM | POA: Diagnosis not present

## 2020-05-01 DIAGNOSIS — Z79899 Other long term (current) drug therapy: Secondary | ICD-10-CM

## 2020-05-01 DIAGNOSIS — R34 Anuria and oliguria: Secondary | ICD-10-CM | POA: Diagnosis not present

## 2020-05-01 NOTE — Patient Instructions (Signed)
Stay hydrated   Checking   Kidney  Function today  Urine test if needed.   Will   Let  You know results   .

## 2020-05-01 NOTE — Progress Notes (Signed)
Chief Complaint  Patient presents with  . Hospitalization Follow-up    had a UTI, feeling better    HPI: Jeanne Tran 86 y.o. come in for FU visit  Seen  [facility] yesterday for dec UO  Urine  sg 1020 and tx leuk   Small bili  acivsed to inc  Fluids and be seen by PCP   She has been drinking more water and now feels urination is back to normal  Never had pain dysuria  .  Cant give urine right away while here Not taking  nsaids  If needed will take tylenol  No excess heat   Last gfr was 31  In [facility] 21  Similar in November 2020  ROS: See pertinent positives and negatives per HPI. No fever chills abd pain   Past Medical History:  Diagnosis Date  . Breast cancer (HCC)    hx of left with radiation and lumpectomy  . Diverticulosis   . GERD (gastroesophageal reflux disease)   . History of colonic polyps   . History of ERCP    and sphincterotomy for abnormal lfts and dilated biliary ducts 5/05  . Hyperlipidemia   . Migraine headache   . Pulmonary nodule   . RLS (restless legs syndrome)   . Wears contact lenses     Family History  Problem Relation Age of Onset  . Mitral valve prolapse Son        with afib to have surgery    Social History   Socioeconomic History  . Marital status: Widowed    Spouse name: Not on file  . Number of children: 3  . Years of education: Not on file  . Highest education level: Not on file  Occupational History  . Not on file  Tobacco Use  . Smoking status: Former Smoker    Packs/day: 3.00    Years: 25.00    Pack years: 75.00    Types: Cigarettes    Quit date: 02/26/1994    Years since quitting: 26.1  . Smokeless tobacco: Never Used  . Tobacco comment: quit 24 years   Vaping Use  . Vaping Use: Never used  Substance and Sexual Activity  . Alcohol use: No  . Drug use: No  . Sexual activity: Not on file  Other Topics Concern  . Not on file  Social History Narrative   Retired; widowed   2 sons   Plays bridge twice monthly    Still drives         Social Determinants of Health   Financial Resource Strain: Low Risk   . Difficulty of Paying Living Expenses: Not very hard  Food Insecurity: No Food Insecurity  . Worried About Charity fundraiser in the Last Year: Never true  . Ran Out of Food in the Last Year: Never true  Transportation Needs: No Transportation Needs  . Lack of Transportation (Medical): No  . Lack of Transportation (Non-Medical): No  Physical Activity: Inactive  . Days of Exercise per Week: 0 days  . Minutes of Exercise per Session: 0 min  Stress:   . Feeling of Stress :   Social Connections: Unknown  . Frequency of Communication with Friends and Family: Not on file  . Frequency of Social Gatherings with Friends and Family: Not on file  . Attends Religious Services: Not on file  . Active Member of Clubs or Organizations: Yes  . Attends Archivist Meetings: More than 4 times per year  .  Marital Status: Widowed    Outpatient Medications Prior to Visit  Medication Sig Dispense Refill  . AMBIEN 10 MG tablet TAKE 1/2 TABLET BY MOUTH AT BEDTIME, AVOID DAILY USE. MAY TAKE ADDITIONAL 1/2 TABLET IF NEEDED. 90 tablet 0  . anastrozole (ARIMIDEX) 1 MG tablet Take 1 tablet (1 mg total) by mouth daily. 90 tablet 3  . BIOTIN PO Take 2,000 mcg by mouth daily.    Marland Kitchen CALCIUM PO Take 1,000 mg by mouth.    . cetirizine (ZYRTEC) 10 MG tablet Take 10 mg by mouth daily.    . Cholecalciferol (VITAMIN D3) 2000 UNITS TABS Take 1 tablet by mouth daily.    Marland Kitchen dicyclomine (BENTYL) 20 MG tablet TAKE 1 TABLET BY MOUTH THREE TIMES DAILY BEFORE MEALS AND AT BEDTIME 270 tablet 1  . famotidine (PEPCID) 20 MG tablet Take 1 tablet (20 mg total) by mouth 2 (two) times daily. 180 tablet 1  . fish oil-omega-3 fatty acids 1000 MG capsule Take 1 g by mouth daily.     . fluocinonide-emollient (LIDEX-E) 0.05 % cream Apply 1 application topically 2 (two) times daily. As  Directed. 30 g 0  . mirtazapine (REMERON) 15 MG  tablet Take 1 to 2 tablets by mouth at bedtime for sleep 180 tablet 0  . moxifloxacin (VIGAMOX) 0.5 % ophthalmic solution moxifloxacin 0.5 % eye drops    . Multiple Vitamin (MULTIVITAMIN) tablet Take 1 tablet by mouth daily.    . Probiotic Product (CVS PROBIOTIC) CHEW Chew 2 capsules by mouth daily.    . rosuvastatin (CRESTOR) 5 MG tablet Take 1 tablet (5 mg total) by mouth daily. Please schedule an appointment with labs for further refills. [phone] 90 tablet 0  . sertraline (ZOLOFT) 100 MG tablet TAKE 1 TABLET(100 MG) BY MOUTH DAILY 90 tablet 1  . SHINGRIX injection      No facility-administered medications prior to visit.     EXAM:  BP 128/76   Pulse 70   Temp 98.4 F (36.9 C) (Oral)   Ht 5\' 5"  (1.651 m)   Wt 147 lb 12.8 oz (67 kg)   SpO2 93%   BMI 24.60 kg/m   Body mass index is 24.6 kg/m.  GENERAL: vitals reviewed and listed above, alert, oriented, appears well hydrated and in no acute distress HEENT: atraumatic, conjunctiva  clear, no obvious abnormalities on inspection of external nose and ears OP : masked  NECK: no obvious masses on inspection palpation  LUNGS: clear to auscultation bilaterally, no wheezes, rales or rhonchi, good air movement CV: HRRR, no clubbing cyanosis or  peripheral edema nl cap refill  Abdomen:  Sof,t normal bowel sounds without hepatosplenomegaly, no guarding rebound or masses no CVA tenderness  MS: moves all extremities without noticeable focal  Abnormality mild djd changes   Skin turgor is normal  PSYCH: pleasant and cooperative, no obvious depression or anxiety Lab Results  Component Value Date   WBC 6.2 07/21/2019   HGB 12.5 07/21/2019   HCT 37.9 07/21/2019   PLT 191 07/21/2019   GLUCOSE 78 05/01/2020   CHOL 181 01/22/2019   TRIG 115.0 01/22/2019   HDL 74.90 01/22/2019   LDLCALC 83 01/22/2019   ALT 36 (H) 05/01/2020   AST 44 (H) 05/01/2020   NA 136 05/01/2020   K 4.6 05/01/2020   CL 101 05/01/2020   CREATININE 1.47 (H)  05/01/2020   BUN 18 05/01/2020   CO2 24 05/01/2020   TSH 3.73 12/27/2015   INR 1.00 06/29/2014  HGBA1C 6.3 01/22/2019   BP Readings from Last 3 Encounters:  05/01/20 128/76  12/03/19 120/80  08/17/19 (!) 147/80  drank 2 small water bottles and then a larger one today soda water   ASSESSMENT AND PLAN:  Discussed the following assessment and plan:  Decreased urine output - poss pre renal  optimize hydration and lab  no evidence ofretention or acute process today - Plan: Comprehensive metabolic panel, POCT Urinalysis Dipstick (Automated), Comprehensive metabolic panel  Chronic renal impairment, unspecified CKD stage - Plan: Comprehensive metabolic panel, POCT Urinalysis Dipstick (Automated), Comprehensive metabolic panel  Stage 3b chronic kidney disease  Osteopenia, unspecified location - Plan: CANCELED: Comprehensive metabolic panel  Medication management cmp ordered today    Get u/a again if possible  Plans to stay hydrated  Monitoring exam is reassuring today  -Patient advised to return or notify health care team  if  new concerns arise. revewie of [facility] records  And labs and  Disc  counseling  And plan  She may have to come back for UA but says she feels things are better  32 minutes  Patient Instructions   Stay hydrated   Checking   Kidney  Function today  Urine test if needed.   Will   Let  You know results   . Standley Brooking. Lillie Bollig M.D.

## 2020-05-02 LAB — COMPREHENSIVE METABOLIC PANEL
AG Ratio: 1.7 (calc) (ref 1.0–2.5)
ALT: 36 U/L — ABNORMAL HIGH (ref 6–29)
AST: 44 U/L — ABNORMAL HIGH (ref 10–35)
Albumin: 4 g/dL (ref 3.6–5.1)
Alkaline phosphatase (APISO): 85 U/L (ref 37–153)
BUN/Creatinine Ratio: 12 (calc) (ref 6–22)
BUN: 18 mg/dL (ref 7–25)
CO2: 24 mmol/L (ref 20–32)
Calcium: 9.3 mg/dL (ref 8.6–10.4)
Chloride: 101 mmol/L (ref 98–110)
Creat: 1.47 mg/dL — ABNORMAL HIGH (ref 0.60–0.88)
Globulin: 2.4 g/dL (calc) (ref 1.9–3.7)
Glucose, Bld: 78 mg/dL (ref 65–99)
Potassium: 4.6 mmol/L (ref 3.5–5.3)
Sodium: 136 mmol/L (ref 135–146)
Total Bilirubin: 0.3 mg/dL (ref 0.2–1.2)
Total Protein: 6.4 g/dL (ref 6.1–8.1)

## 2020-05-02 LAB — POC URINALSYSI DIPSTICK (AUTOMATED)
Bilirubin, UA: NEGATIVE
Blood, UA: NEGATIVE
Glucose, UA: NEGATIVE
Ketones, UA: NEGATIVE
Leukocytes, UA: NEGATIVE
Nitrite, UA: NEGATIVE
Protein, UA: NEGATIVE
Spec Grav, UA: 1.02 (ref 1.010–1.025)
Urobilinogen, UA: 0.2 E.U./dL
pH, UA: 5.5 (ref 5.0–8.0)

## 2020-05-02 LAB — SPECIMEN COMPROMISED

## 2020-05-02 NOTE — Progress Notes (Signed)
Kidney function is stable   slightly better and no uti  normal specimen   Liver tests are slightly abnormal  not worrisome. But still should follow up   Plan repeat cmp in 2- 3 months. Dx CKD and abnormal lfts  Stay hydrated as  discussed

## 2020-05-03 ENCOUNTER — Other Ambulatory Visit: Payer: Self-pay

## 2020-05-03 DIAGNOSIS — R945 Abnormal results of liver function studies: Secondary | ICD-10-CM

## 2020-05-03 DIAGNOSIS — R7989 Other specified abnormal findings of blood chemistry: Secondary | ICD-10-CM

## 2020-05-03 DIAGNOSIS — N1832 Chronic kidney disease, stage 3b: Secondary | ICD-10-CM

## 2020-05-11 DIAGNOSIS — Z23 Encounter for immunization: Secondary | ICD-10-CM | POA: Diagnosis not present

## 2020-06-06 ENCOUNTER — Other Ambulatory Visit: Payer: Self-pay

## 2020-06-06 NOTE — Progress Notes (Signed)
Chief Complaint  Patient presents with  . Follow-up    Doing well    HPI: Jeanne Tran 86 y.o. come in for Chronic disease management  And fu from last   Sore spot under left ear .  [place] the other day    Getting to br in dark forget her flashlight And    In dark  r arm Arm and leg.  s no loc chest pain sob  Of neuro sx    ok happened about a week ago  Denies dizziness   Fever neuro sx thinks memory about the same  Plays bridge   ROS: See pertinent positives and negatives per HPI.  Past Medical History:  Diagnosis Date  . Breast cancer (HCC)    hx of left with radiation and lumpectomy  . Diverticulosis   . GERD (gastroesophageal reflux disease)   . History of colonic polyps   . History of ERCP    and sphincterotomy for abnormal lfts and dilated biliary ducts 5/05  . Hyperlipidemia   . Migraine headache   . Pulmonary nodule   . RLS (restless legs syndrome)   . Wears contact lenses     Family History  Problem Relation Age of Onset  . Mitral valve prolapse Son        with afib to have surgery    Social History   Socioeconomic History  . Marital status: Widowed    Spouse name: Not on file  . Number of children: 3  . Years of education: Not on file  . Highest education level: Not on file  Occupational History  . Not on file  Tobacco Use  . Smoking status: Former Smoker    Packs/day: 3.00    Years: 25.00    Pack years: 75.00    Types: Cigarettes    Quit date: 02/26/1994    Years since quitting: 26.2  . Smokeless tobacco: Never Used  . Tobacco comment: quit 24 years   Vaping Use  . Vaping Use: Never used  Substance and Sexual Activity  . Alcohol use: No  . Drug use: No  . Sexual activity: Not on file  Other Topics Concern  . Not on file  Social History Narrative   Retired; widowed   2 sons   Plays bridge twice monthly   Still drives         Social Determinants of Health   Financial Resource Strain: Low Risk   . Difficulty of Paying Living  Expenses: Not very hard  Food Insecurity: No Food Insecurity  . Worried About Charity fundraiser in the Last Year: Never true  . Ran Out of Food in the Last Year: Never true  Transportation Needs: No Transportation Needs  . Lack of Transportation (Medical): No  . Lack of Transportation (Non-Medical): No  Physical Activity: Inactive  . Days of Exercise per Week: 0 days  . Minutes of Exercise per Session: 0 min  Stress:   . Feeling of Stress : Not on file  Social Connections: Unknown  . Frequency of Communication with Friends and Family: Not on file  . Frequency of Social Gatherings with Friends and Family: Not on file  . Attends Religious Services: Not on file  . Active Member of Clubs or Organizations: Yes  . Attends Archivist Meetings: More than 4 times per year  . Marital Status: Widowed    Outpatient Medications Prior to Visit  Medication Sig Dispense Refill  . AMBIEN 10  MG tablet TAKE 1/2 TABLET BY MOUTH AT BEDTIME, AVOID DAILY USE. MAY TAKE ADDITIONAL 1/2 TABLET IF NEEDED. 90 tablet 0  . BIOTIN PO Take 2,000 mcg by mouth daily.    Marland Kitchen CALCIUM PO Take 1,000 mg by mouth.    . cetirizine (ZYRTEC) 10 MG tablet Take 10 mg by mouth daily.    . Cholecalciferol (VITAMIN D3) 2000 UNITS TABS Take 1 tablet by mouth daily.    Marland Kitchen dicyclomine (BENTYL) 20 MG tablet TAKE 1 TABLET BY MOUTH THREE TIMES DAILY BEFORE MEALS AND AT BEDTIME 270 tablet 1  . famotidine (PEPCID) 20 MG tablet Take 1 tablet (20 mg total) by mouth 2 (two) times daily. 180 tablet 1  . fish oil-omega-3 fatty acids 1000 MG capsule Take 1 g by mouth daily.     . fluocinonide-emollient (LIDEX-E) 0.05 % cream Apply 1 application topically 2 (two) times daily. As  Directed. 30 g 0  . mirtazapine (REMERON) 15 MG tablet Take 1 to 2 tablets by mouth at bedtime for sleep 180 tablet 0  . moxifloxacin (VIGAMOX) 0.5 % ophthalmic solution moxifloxacin 0.5 % eye drops    . Multiple Vitamin (MULTIVITAMIN) tablet Take 1 tablet by  mouth daily.    . Probiotic Product (CVS PROBIOTIC) CHEW Chew 2 capsules by mouth daily.    . rosuvastatin (CRESTOR) 5 MG tablet Take 1 tablet (5 mg total) by mouth daily. Please schedule an appointment with labs for further refills. [phone] 90 tablet 0  . sertraline (ZOLOFT) 100 MG tablet TAKE 1 TABLET(100 MG) BY MOUTH DAILY 90 tablet 1  . SHINGRIX injection     . anastrozole (ARIMIDEX) 1 MG tablet Take 1 tablet (1 mg total) by mouth daily. (Patient not taking: Reported on 06/07/2020) 90 tablet 3   No facility-administered medications prior to visit.     EXAM:  BP 122/70   Pulse 80   Temp 98.8 F (37.1 C) (Oral)   Ht 5\' 5"  (1.651 m)   Wt 145 lb 3.2 oz (65.9 kg)   SpO2 95%   BMI 24.16 kg/m   Body mass index is 24.16 kg/m.  GENERAL: vitals reviewed and listed above, alert, oriented, appears well hydrated and in no acute distress HEENT: atraumatic, conjunctiva  clear, no obvious abnormalities on inspection of external nose and ears ears   Wax right more than left   OP : masked  NECK: no obvious masses on inspection palpation  LUNGS: clear to auscultation bilaterally, no wheezes, rales or rhonchi, good air movement CV: HRRR, no clubbing cyanosis or  peripheral edema nl cap refill  MS: moves all extremities without noticeable focal  Abnormality arthritis changes     Bruising le left shin hematoma about 4 cm min tender  Skin intact  Has bruise right arm nl rom   PSYCH: pleasant and cooperative, no obvious depression or anxiety memory grossly nl albeit she say slower  Gait is normal and steady  Lab Results  Component Value Date   WBC 6.2 07/21/2019   HGB 12.5 07/21/2019   HCT 37.9 07/21/2019   PLT 191 07/21/2019   GLUCOSE 78 05/01/2020   CHOL 181 01/22/2019   TRIG 115.0 01/22/2019   HDL 74.90 01/22/2019   LDLCALC 83 01/22/2019   ALT 36 (H) 05/01/2020   AST 44 (H) 05/01/2020   NA 136 05/01/2020   K 4.6 05/01/2020   CL 101 05/01/2020   CREATININE 1.47 (H) 05/01/2020    BUN 18 05/01/2020   CO2 24 05/01/2020  TSH 3.73 12/27/2015   INR 1.00 06/29/2014   HGBA1C 6.3 01/22/2019   BP Readings from Last 3 Encounters:  06/07/20 122/70  05/01/20 128/76  12/03/19 120/80    ASSESSMENT AND PLAN:  Discussed the following assessment and plan:  Stage 3b chronic kidney disease - Plan: Hepatic function panel, BASIC METABOLIC PANEL WITH GFR, CBC with Differential/Platelet, Lipid panel, Lipid panel, CBC with Differential/Platelet, BASIC METABOLIC PANEL WITH GFR, Hepatic function panel, CMP  Abnormal LFTs - Plan: Hepatic function panel, BASIC METABOLIC PANEL WITH GFR, CBC with Differential/Platelet, Lipid panel, Lipid panel, CBC with Differential/Platelet, BASIC METABOLIC PANEL WITH GFR, Hepatic function panel, CMP  Medication management - Plan: Hepatic function panel, BASIC METABOLIC PANEL WITH GFR, CBC with Differential/Platelet, Lipid panel, Lipid panel, CBC with Differential/Platelet, BASIC METABOLIC PANEL WITH GFR, Hepatic function panel  Contusion of lower leg, unspecified laterality, initial encounter - Plan: Hepatic function panel, BASIC METABOLIC PANEL WITH GFR, CBC with Differential/Platelet, Lipid panel, Lipid panel, CBC with Differential/Platelet, BASIC METABOLIC PANEL WITH GFR, Hepatic function panel  History of fall  Need for influenza vaccination - Plan: Flu Vaccine QUAD High Dose(Fluad)  High risk medication use Flu vaccine   Fu labs  revewied fall and  Se of ambien  And reaction time   tisk  Memory grossly same  Will follow -Patient advised to return or notify health care team  if  new concerns arise.  Patient Instructions  Will notify you  of labs when available. Fall prevention It will take a while for the lump on your leg to resolve  Avoid trauma  Let us know if  Enlarging or infected . Flu vaccine today .  Plan  rov in 6 months or as needed    Fall Prevention in the Home, Adult Falls can cause injuries and can affect people from all age  groups. There are many simple things that you can do to make your home safe and to help prevent falls. Ask for help when making these changes, if needed. What actions can I take to prevent falls? General instructions  Use good lighting in all rooms. Replace any light bulbs that burn out.  Turn on lights if it is dark. Use night-lights.  Place frequently used items in easy-to-reach places. Lower the shelves around your home if necessary.  Set up furniture so that there are clear paths around it. Avoid moving your furniture around.  Remove throw rugs and other tripping hazards from the floor.  Avoid walking on wet floors.  Fix any uneven floor surfaces.  Add color or contrast paint or tape to grab bars and handrails in your home. Place contrasting color strips on the first and last steps of stairways.  When you use a stepladder, make sure that it is completely opened and that the sides are firmly locked. Have someone hold the ladder while you are using it. Do not climb a closed stepladder.  Be aware of any and all pets. What can I do in the bathroom?      Keep the floor dry. Immediately clean up any water that spills onto the floor.  Remove soap buildup in the tub or shower on a regular basis.  Use non-skid mats or decals on the floor of the tub or shower.  Attach bath mats securely with double-sided, non-slip rug tape.  If you need to sit down while you are in the shower, use a plastic, non-slip stool.  Install grab bars by the toilet and in the tub  and shower. Do not use towel bars as grab bars. What can I do in the bedroom?  Make sure that a bedside light is easy to reach.  Do not use oversized bedding that drapes onto the floor.  Have a firm chair that has side arms to use for getting dressed. What can I do in the kitchen?  Clean up any spills right away.  If you need to reach for something above you, use a sturdy step stool that has a grab bar.  Keep electrical  cables out of the way.  Do not use floor polish or wax that makes floors slippery. If you must use wax, make sure that it is non-skid floor wax. What can I do in the stairways?  Do not leave any items on the stairs.  Make sure that you have a light switch at the top of the stairs and the bottom of the stairs. Have them installed if you do not have them.  Make sure that there are handrails on both sides of the stairs. Fix handrails that are broken or loose. Make sure that handrails are as long as the stairways.  Install non-slip stair treads on all stairs in your home.  Avoid having throw rugs at the top or bottom of stairways, or secure the rugs with carpet tape to prevent them from moving.  Choose a carpet design that does not hide the edge of steps on the stairway.  Check any carpeting to make sure that it is firmly attached to the stairs. Fix any carpet that is loose or worn. What can I do on the outside of my home?  Use bright outdoor lighting.  Regularly repair the edges of walkways and driveways and fix any cracks.  Remove high doorway thresholds.  Trim any shrubbery on the main path into your home.  Regularly check that handrails are securely fastened and in good repair. Both sides of any steps should have handrails.  Install guardrails along the edges of any raised decks or porches.  Clear walkways of debris and clutter, including tools and rocks.  Have leaves, snow, and ice cleared regularly.  Use sand or salt on walkways during winter months.  In the garage, clean up any spills right away, including grease or oil spills. What other actions can I take?  Wear closed-toe shoes that fit well and support your feet. Wear shoes that have rubber soles or low heels.  Use mobility aids as needed, such as canes, walkers, scooters, and crutches.  Review your medicines with your health care provider. Some medicines can cause dizziness or changes in blood pressure, which  increase your risk of falling. Talk with your health care provider about other ways that you can decrease your risk of falls. This may include working with a physical therapist or trainer to improve your strength, balance, and endurance. Where to find more information  Centers for Disease Control and Prevention, [employer]: WebmailGuide.co.za  [employer] on Aging: BrainJudge.co.uk Contact a health care provider if:  You are afraid of falling at home.  You feel weak, drowsy, or dizzy at home.  You fall at home. Summary  There are many simple things that you can do to make your home safe and to help prevent falls.  Ways to make your home safe include removing tripping hazards and installing grab bars in the bathroom.  Ask for help when making these changes in your home. This information is not intended to replace advice given to you by  your health care provider. Make sure you discuss any questions you have with your health care provider. Document Revised: 08/15/2017 Document Reviewed: 04/17/2017 Elsevier Patient Education  2020 [place] Bethene Hankinson M.D.

## 2020-06-07 ENCOUNTER — Ambulatory Visit (INDEPENDENT_AMBULATORY_CARE_PROVIDER_SITE_OTHER): Payer: Medicare Other | Admitting: Internal Medicine

## 2020-06-07 ENCOUNTER — Encounter: Payer: Self-pay | Admitting: Internal Medicine

## 2020-06-07 VITALS — BP 122/70 | HR 80 | Temp 98.8°F | Ht 65.0 in | Wt 145.2 lb

## 2020-06-07 DIAGNOSIS — N1832 Chronic kidney disease, stage 3b: Secondary | ICD-10-CM

## 2020-06-07 DIAGNOSIS — S8011XA Contusion of right lower leg, initial encounter: Secondary | ICD-10-CM

## 2020-06-07 DIAGNOSIS — Z79899 Other long term (current) drug therapy: Secondary | ICD-10-CM | POA: Diagnosis not present

## 2020-06-07 DIAGNOSIS — R945 Abnormal results of liver function studies: Secondary | ICD-10-CM | POA: Diagnosis not present

## 2020-06-07 DIAGNOSIS — S8010XA Contusion of unspecified lower leg, initial encounter: Secondary | ICD-10-CM | POA: Diagnosis not present

## 2020-06-07 DIAGNOSIS — Z23 Encounter for immunization: Secondary | ICD-10-CM | POA: Diagnosis not present

## 2020-06-07 DIAGNOSIS — R7989 Other specified abnormal findings of blood chemistry: Secondary | ICD-10-CM

## 2020-06-07 DIAGNOSIS — Z9181 History of falling: Secondary | ICD-10-CM | POA: Diagnosis not present

## 2020-06-07 NOTE — Patient Instructions (Addendum)
Will notify you  of labs when available. Fall prevention It will take a while for the lump on your leg to resolve  Avoid trauma  Let us know if  Enlarging or infected . Flu vaccine today .  Plan  rov in 6 months or as needed    Fall Prevention in the Home, Adult Falls can cause injuries and can affect people from all age groups. There are many simple things that you can do to make your home safe and to help prevent falls. Ask for help when making these changes, if needed. What actions can I take to prevent falls? General instructions  Use good lighting in all rooms. Replace any light bulbs that burn out.  Turn on lights if it is dark. Use night-lights.  Place frequently used items in easy-to-reach places. Lower the shelves around your home if necessary.  Set up furniture so that there are clear paths around it. Avoid moving your furniture around.  Remove throw rugs and other tripping hazards from the floor.  Avoid walking on wet floors.  Fix any uneven floor surfaces.  Add color or contrast paint or tape to grab bars and handrails in your home. Place contrasting color strips on the first and last steps of stairways.  When you use a stepladder, make sure that it is completely opened and that the sides are firmly locked. Have someone hold the ladder while you are using it. Do not climb a closed stepladder.  Be aware of any and all pets. What can I do in the bathroom?      Keep the floor dry. Immediately clean up any water that spills onto the floor.  Remove soap buildup in the tub or shower on a regular basis.  Use non-skid mats or decals on the floor of the tub or shower.  Attach bath mats securely with double-sided, non-slip rug tape.  If you need to sit down while you are in the shower, use a plastic, non-slip stool.  Install grab bars by the toilet and in the tub and shower. Do not use towel bars as grab bars. What can I do in the bedroom?  Make sure that a  bedside light is easy to reach.  Do not use oversized bedding that drapes onto the floor.  Have a firm chair that has side arms to use for getting dressed. What can I do in the kitchen?  Clean up any spills right away.  If you need to reach for something above you, use a sturdy step stool that has a grab bar.  Keep electrical cables out of the way.  Do not use floor polish or wax that makes floors slippery. If you must use wax, make sure that it is non-skid floor wax. What can I do in the stairways?  Do not leave any items on the stairs.  Make sure that you have a light switch at the top of the stairs and the bottom of the stairs. Have them installed if you do not have them.  Make sure that there are handrails on both sides of the stairs. Fix handrails that are broken or loose. Make sure that handrails are as long as the stairways.  Install non-slip stair treads on all stairs in your home.  Avoid having throw rugs at the top or bottom of stairways, or secure the rugs with carpet tape to prevent them from moving.  Choose a carpet design that does not hide the edge of steps on the  stairway.  Check any carpeting to make sure that it is firmly attached to the stairs. Fix any carpet that is loose or worn. What can I do on the outside of my home?  Use bright outdoor lighting.  Regularly repair the edges of walkways and driveways and fix any cracks.  Remove high doorway thresholds.  Trim any shrubbery on the main path into your home.  Regularly check that handrails are securely fastened and in good repair. Both sides of any steps should have handrails.  Install guardrails along the edges of any raised decks or porches.  Clear walkways of debris and clutter, including tools and rocks.  Have leaves, snow, and ice cleared regularly.  Use sand or salt on walkways during winter months.  In the garage, clean up any spills right away, including grease or oil spills. What other  actions can I take?  Wear closed-toe shoes that fit well and support your feet. Wear shoes that have rubber soles or low heels.  Use mobility aids as needed, such as canes, walkers, scooters, and crutches.  Review your medicines with your health care provider. Some medicines can cause dizziness or changes in blood pressure, which increase your risk of falling. Talk with your health care provider about other ways that you can decrease your risk of falls. This may include working with a physical therapist or trainer to improve your strength, balance, and endurance. Where to find more information  Centers for Disease Control and Prevention, [employer]: WebmailGuide.co.za  [employer] on Aging: BrainJudge.co.uk Contact a health care provider if:  You are afraid of falling at home.  You feel weak, drowsy, or dizzy at home.  You fall at home. Summary  There are many simple things that you can do to make your home safe and to help prevent falls.  Ways to make your home safe include removing tripping hazards and installing grab bars in the bathroom.  Ask for help when making these changes in your home. This information is not intended to replace advice given to you by your health care provider. Make sure you discuss any questions you have with your health care provider. Document Revised: 08/15/2017 Document Reviewed: 04/17/2017 Elsevier Patient Education  2020 [employer].

## 2020-06-08 LAB — HEPATIC FUNCTION PANEL
AG Ratio: 2 (calc) (ref 1.0–2.5)
ALT: 18 U/L (ref 6–29)
AST: 25 U/L (ref 10–35)
Albumin: 4.1 g/dL (ref 3.6–5.1)
Alkaline phosphatase (APISO): 86 U/L (ref 37–153)
Bilirubin, Direct: 0.1 mg/dL (ref 0.0–0.2)
Globulin: 2.1 g/dL (calc) (ref 1.9–3.7)
Indirect Bilirubin: 0.3 mg/dL (calc) (ref 0.2–1.2)
Total Bilirubin: 0.4 mg/dL (ref 0.2–1.2)
Total Protein: 6.2 g/dL (ref 6.1–8.1)

## 2020-06-08 LAB — CBC WITH DIFFERENTIAL/PLATELET
Absolute Monocytes: 678 cells/uL (ref 200–950)
Basophils Absolute: 63 cells/uL (ref 0–200)
Basophils Relative: 1.1 %
Eosinophils Absolute: 342 cells/uL (ref 15–500)
Eosinophils Relative: 6 %
HCT: 38.6 % (ref 35.0–45.0)
Hemoglobin: 12.4 g/dL (ref 11.7–15.5)
Lymphs Abs: 1607 cells/uL (ref 850–3900)
MCH: 31.3 pg (ref 27.0–33.0)
MCHC: 32.1 g/dL (ref 32.0–36.0)
MCV: 97.5 fL (ref 80.0–100.0)
MPV: 11.1 fL (ref 7.5–12.5)
Monocytes Relative: 11.9 %
Neutro Abs: 3010 cells/uL (ref 1500–7800)
Neutrophils Relative %: 52.8 %
Platelets: 190 10*3/uL (ref 140–400)
RBC: 3.96 10*6/uL (ref 3.80–5.10)
RDW: 12.7 % (ref 11.0–15.0)
Total Lymphocyte: 28.2 %
WBC: 5.7 10*3/uL (ref 3.8–10.8)

## 2020-06-08 LAB — COMPREHENSIVE METABOLIC PANEL
AG Ratio: 2 (calc) (ref 1.0–2.5)
ALT: 18 U/L (ref 6–29)
AST: 25 U/L (ref 10–35)
Albumin: 4.1 g/dL (ref 3.6–5.1)
Alkaline phosphatase (APISO): 86 U/L (ref 37–153)
BUN/Creatinine Ratio: 17 (calc) (ref 6–22)
BUN: 32 mg/dL — ABNORMAL HIGH (ref 7–25)
CO2: 28 mmol/L (ref 20–32)
Calcium: 9.9 mg/dL (ref 8.6–10.4)
Chloride: 107 mmol/L (ref 98–110)
Creat: 1.92 mg/dL — ABNORMAL HIGH (ref 0.60–0.88)
Globulin: 2.1 g/dL (calc) (ref 1.9–3.7)
Glucose, Bld: 83 mg/dL (ref 65–99)
Potassium: 4.6 mmol/L (ref 3.5–5.3)
Sodium: 142 mmol/L (ref 135–146)
Total Bilirubin: 0.4 mg/dL (ref 0.2–1.2)
Total Protein: 6.2 g/dL (ref 6.1–8.1)

## 2020-06-08 LAB — LIPID PANEL
Cholesterol: 161 mg/dL (ref <200)
HDL: 72 mg/dL (ref >=50)
LDL Cholesterol (Calc): 69 mg/dL (calc)
Non-HDL Cholesterol (Calc): 89 mg/dL (calc) (ref <130)
Total CHOL/HDL Ratio: 2.2 (calc) (ref <5.0)
Triglycerides: 122 mg/dL (ref <150)

## 2020-06-08 LAB — BASIC METABOLIC PANEL WITH GFR
BUN/Creatinine Ratio: 17 (calc) (ref 6–22)
BUN: 32 mg/dL — ABNORMAL HIGH (ref 7–25)
CO2: 28 mmol/L (ref 20–32)
Calcium: 9.9 mg/dL (ref 8.6–10.4)
Chloride: 107 mmol/L (ref 98–110)
Creat: 1.92 mg/dL — ABNORMAL HIGH (ref 0.60–0.88)
GFR, Est African American: 27 mL/min/{1.73_m2} — ABNORMAL LOW (ref >=60)
GFR, Est Non African American: 23 mL/min/{1.73_m2} — ABNORMAL LOW (ref >=60)
Glucose, Bld: 83 mg/dL (ref 65–99)
Potassium: 4.6 mmol/L (ref 3.5–5.3)
Sodium: 142 mmol/L (ref 135–146)

## 2020-06-08 NOTE — Progress Notes (Signed)
Cholesterol so very good  no anemia  liver panel now normal  But kdiney function is some worse than last month . Not sure why Continue avoiding adivl aleve products . Stay hydrated .  Rechekc bmp with gfr in    2 months.  Dont have to fast

## 2020-06-09 ENCOUNTER — Telehealth: Payer: Self-pay | Admitting: Internal Medicine

## 2020-06-09 DIAGNOSIS — N189 Chronic kidney disease, unspecified: Secondary | ICD-10-CM

## 2020-06-09 NOTE — Telephone Encounter (Signed)
Pt aware of lab results and in agreement with plan/she will call and schedule a lab visit for 2 months from now/created future order for BMP with GFR/thx dmf

## 2020-06-09 NOTE — Telephone Encounter (Signed)
-----   Message from Burnis Medin, MD sent at 06/08/2020  7:25 PM EDT ----- Cholesterol so very good  no anemia  liver panel now normal  But kdiney function is some worse than last month . Not sure why Continue avoiding adivl aleve products . Stay hydrated .  Rechekc bmp with gfr in    2 months.  Dont have to fast

## 2020-06-12 ENCOUNTER — Other Ambulatory Visit: Payer: Self-pay | Admitting: Internal Medicine

## 2020-06-12 NOTE — Telephone Encounter (Signed)
Last OV 06/07/2020  Last filled 12/03/2019, # 90 with 0 refills  Can you fill in providers absence?

## 2020-06-19 ENCOUNTER — Telehealth: Payer: Self-pay | Admitting: Internal Medicine

## 2020-06-19 NOTE — Telephone Encounter (Signed)
Last OV 06/07/2020  Ambien was filled on 06/12/2020  Remeron last filled 06/25/2019, # 180 with 0 refiils  OK to continue Remeron?

## 2020-06-19 NOTE — Telephone Encounter (Signed)
Ok to refill the  Remeron  # 180     It appears that the [place] was refilled last week by dr Elease Hashimoto  So not  Due for refill yet .  Let us know if that is not accurate

## 2020-06-19 NOTE — Telephone Encounter (Signed)
Pt is calling in stating that she needs a refill on the following Rx's mirtazapine (REMERON) 15 MG #90 w/refills and AMBIEN 10 MG #90 w/refills.  Pharm:  [facility] on [address] and [address]

## 2020-06-20 ENCOUNTER — Other Ambulatory Visit: Payer: Self-pay

## 2020-06-20 MED ORDER — MIRTAZAPINE 15 MG PO TABS: ORAL_TABLET | ORAL | 0 refills | Status: DC

## 2020-06-20 NOTE — Telephone Encounter (Signed)
Remeron refill has been sent to the pharmacy requested by patient.

## 2020-06-30 DIAGNOSIS — H43812 Vitreous degeneration, left eye: Secondary | ICD-10-CM | POA: Diagnosis not present

## 2020-06-30 DIAGNOSIS — H0102A Squamous blepharitis right eye, upper and lower eyelids: Secondary | ICD-10-CM | POA: Diagnosis not present

## 2020-06-30 DIAGNOSIS — Z961 Presence of intraocular lens: Secondary | ICD-10-CM | POA: Diagnosis not present

## 2020-06-30 DIAGNOSIS — H0102B Squamous blepharitis left eye, upper and lower eyelids: Secondary | ICD-10-CM | POA: Diagnosis not present

## 2020-07-01 ENCOUNTER — Other Ambulatory Visit: Payer: Self-pay | Admitting: Internal Medicine

## 2020-07-05 ENCOUNTER — Other Ambulatory Visit (INDEPENDENT_AMBULATORY_CARE_PROVIDER_SITE_OTHER): Payer: Medicare Other

## 2020-07-05 ENCOUNTER — Other Ambulatory Visit: Payer: Self-pay

## 2020-07-05 DIAGNOSIS — N189 Chronic kidney disease, unspecified: Secondary | ICD-10-CM

## 2020-07-06 LAB — BASIC METABOLIC PANEL WITH GFR
BUN/Creatinine Ratio: 24 (calc) — ABNORMAL HIGH (ref 6–22)
BUN: 41 mg/dL — ABNORMAL HIGH (ref 7–25)
CO2: 29 mmol/L (ref 20–32)
Calcium: 10.2 mg/dL (ref 8.6–10.4)
Chloride: 105 mmol/L (ref 98–110)
Creat: 1.74 mg/dL — ABNORMAL HIGH (ref 0.60–0.88)
GFR, Est African American: 31 mL/min/{1.73_m2} — ABNORMAL LOW (ref >=60)
GFR, Est Non African American: 26 mL/min/{1.73_m2} — ABNORMAL LOW (ref >=60)
Glucose, Bld: 96 mg/dL (ref 65–99)
Potassium: 4.8 mmol/L (ref 3.5–5.3)
Sodium: 140 mmol/L (ref 135–146)

## 2020-07-06 NOTE — Progress Notes (Signed)
Stable  and slight ht improvement .  Plan rov in March  or as needed

## 2020-07-19 ENCOUNTER — Telehealth: Payer: Self-pay

## 2020-07-19 NOTE — Telephone Encounter (Signed)
Called both numbers listed on chart called several times yesterday and today was unable to leave messages

## 2020-07-20 ENCOUNTER — Inpatient Hospital Stay: Payer: Medicare Other | Attending: Nurse Practitioner | Admitting: Nurse Practitioner

## 2020-07-20 ENCOUNTER — Encounter: Payer: Self-pay | Admitting: Nurse Practitioner

## 2020-07-20 DIAGNOSIS — E2839 Other primary ovarian failure: Secondary | ICD-10-CM | POA: Diagnosis not present

## 2020-07-20 DIAGNOSIS — D0512 Intraductal carcinoma in situ of left breast: Secondary | ICD-10-CM

## 2020-07-20 DIAGNOSIS — Z1231 Encounter for screening mammogram for malignant neoplasm of breast: Secondary | ICD-10-CM | POA: Diagnosis not present

## 2020-07-20 NOTE — Progress Notes (Signed)
Jeanne Tran   [phone] [phone]) [phone]   Clinic Follow up Note   Patient Care Team: Panosh, Standley Brooking, MD as PCP - General Dyke Maes, OD (Optometry) Kary Kos, MD (Neurosurgery) [name], MD as Consulting Physician (General Surgery) Truitt Merle, MD as Consulting Physician (Hematology) Daryll Brod, MD as Consulting Physician (Orthopedic Surgery) Ladene Artist, MD as Consulting Physician (Gastroenterology) Warden Fillers, MD as Consulting Physician (Ophthalmology) Harriett Sine, MD as Consulting Physician (Dermatology) 07/20/2020  I connected with Jeanne Tran on 07/20/20 at  1:15 PM EDT by telephone visit and verified that I am speaking with the correct person using two identifiers.   I discussed the limitations, risks, security and privacy concerns of performing an evaluation and management service by telemedicine and the availability of in-person appointments. I also discussed with the patient that there may be a patient responsible charge related to this service. The patient expressed understanding and agreed to proceed.   Other persons participating in the visit and their role in the encounter: None  Patient's location: home  Provider's location: home    CHIEF COMPLAINT: F/u left breast DCIS  SUMMARY OF ONCOLOGIC HISTORY: Oncology History Overview Note  Diagnosed in 1995, s/p lumpectomy and 5 years Tamoxifen. Records not available    Ductal carcinoma in situ (DCIS) of left breast  1995 Cancer Diagnosis   History of left breast cancer, status post lumpectomy and 5 years tamoxifen.   06/08/2014 Initial Diagnosis   Cancer of left breast   06/08/2014 Initial Biopsy   Left breast biopsy showed DCIS.   07/01/2014 Surgery   Left breast lumpectomy, surgical margins were negative.   08/10/2014 - 07/2019 Anti-estrogen oral therapy   She started adjuvant tamoxifen, and was subsequently switched to anastrozole a few  months later due to her anti-depression medications Zoloft. Completed 5 years in 07/2019.    06/2017 Mammogram   She had an abnormal breast US on 07/14/17 which showed a 91mm lesion in her left breast. She had a US guided biopsy on 07/16/17 which showed the mass to be benign fat necrosis. Her repeated mammogram and ultrasound in June 2019 was unremarkable.     CURRENT THERAPY: anastrozole 07/2014 - 07/2019; observation   INTERVAL HISTORY: Jeanne Tran returns for f/u as scheduled. She was last seen 07/21/19. She mixed up her dates so we will proceed with phone f/u today. She is doing well, denies changes in her health in the last year. Appetite and energy fluctuate which is her baseline. Denies unintentional weight loss. She does self exams. Denies new lump/mass in her breasts, bone pain, change in bowel habits, abdominal pain, fever, chills, cough, chest pain, dyspnea, or other concerns. She is up to date on flu vac and covid19 booster.     MEDICAL HISTORY:  Past Medical History:  Diagnosis Date  . Breast cancer (HCC)    hx of left with radiation and lumpectomy  . Diverticulosis   . GERD (gastroesophageal reflux disease)   . History of colonic polyps   . History of ERCP    and sphincterotomy for abnormal lfts and dilated biliary ducts 5/05  . Hyperlipidemia   . Migraine headache   . Pulmonary nodule   . RLS (restless legs syndrome)   . Wears contact lenses     SURGICAL HISTORY: Past Surgical History:  Procedure Laterality Date  . ABDOMINAL HYSTERECTOMY     bso  . APPENDECTOMY    . BREAST LUMPECTOMY  1995   lt  lumpsnbx  . BREAST LUMPECTOMY WITH RADIOACTIVE SEED LOCALIZATION Left 07/01/2014   Procedure: LEFT BREAST LUMPECTOMY WITH RADIOACTIVE SEED LOCALIZATION;  Surgeon: [name], MD;  Location: [facility];  Service: General;  Laterality: Left;  . CERVICAL SPINE SURGERY  3/03  . CHOLECYSTECTOMY  2012  . Complete Hysterectomy    . CYSTOCELE REPAIR    . ERCP      for abnormal lfts and dilated biliary ducts 5/05  . gallbladder duct open  2006  . HEMORRHOID SURGERY    . HYSTEROSCOPY    . LYMPH NODE BIOPSY    . Orthoscopic Rt Knee    . SPHINCTEROTOMY     for abnormal lfts and dilated biliary ducts 5/05  . tonsillectomy      I have reviewed the social history and family history with the patient and they are unchanged from previous note.  ALLERGIES:  is allergic to codeine and aspirin.  MEDICATIONS:  Current Outpatient Medications  Medication Sig Dispense Refill  . AMBIEN 10 MG tablet TAKE 1/2 TABLET BY MOUTH AT BEDTIME. AVOID DAILY USE. MAY TAKE AN ADDITIONAL 1/2 TABLET IF NEEDED 90 tablet 0  . BIOTIN PO Take 2,000 mcg by mouth daily.    Marland Kitchen CALCIUM PO Take 1,000 mg by mouth.    . cetirizine (ZYRTEC) 10 MG tablet Take 10 mg by mouth daily.    . Cholecalciferol (VITAMIN D3) 2000 UNITS TABS Take 1 tablet by mouth daily.    Marland Kitchen dicyclomine (BENTYL) 20 MG tablet TAKE 1 TABLET BY MOUTH THREE TIMES DAILY BEFORE MEALS AND AT BEDTIME 270 tablet 1  . famotidine (PEPCID) 20 MG tablet Take 1 tablet (20 mg total) by mouth 2 (two) times daily. 180 tablet 1  . fish oil-omega-3 fatty acids 1000 MG capsule Take 1 g by mouth daily.     . fluocinonide-emollient (LIDEX-E) 0.05 % cream Apply 1 application topically 2 (two) times daily. As  Directed. 30 g 0  . mirtazapine (REMERON) 15 MG tablet Take 1 to 2 tablets by mouth at bedtime for sleep 180 tablet 0  . moxifloxacin (VIGAMOX) 0.5 % ophthalmic solution moxifloxacin 0.5 % eye drops    . Multiple Vitamin (MULTIVITAMIN) tablet Take 1 tablet by mouth daily.    . Probiotic Product ([facility] PROBIOTIC) CHEW Chew 2 capsules by mouth daily.    . rosuvastatin (CRESTOR) 5 MG tablet TAKE 1 TABLET BY MOUTH EVERY DAY 90 tablet 0  . sertraline (ZOLOFT) 100 MG tablet TAKE 1 TABLET(100 MG) BY MOUTH DAILY 90 tablet 1  . SHINGRIX injection      No current facility-administered medications for this visit.    PHYSICAL  EXAMINATION: ECOG PERFORMANCE STATUS: 0 - Asymptomatic  There were no vitals filed for this visit. There were no vitals filed for this visit.  Patient appears well over the phone. Speech is clear and intact. no cough or conversational dyspnea. Mood and affect appear normal for situation. Breast exam deferred on today's virtual visit.    LABORATORY DATA:  I have reviewed the data as listed CBC Latest Ref Rng & Units 06/07/2020 07/21/2019 01/22/2019  WBC 3.8 - 10.8 Thousand/uL 5.7 6.2 6.3  Hemoglobin 11.7 - 15.5 g/dL 12.4 12.5 13.5  Hematocrit 35 - 45 % 38.6 37.9 39.9  Platelets 140 - 400 Thousand/uL 190 191 188.0     CMP Latest Ref Rng & Units 07/05/2020 06/07/2020 06/07/2020  Glucose 65 - 99 mg/dL 96 83 83  BUN 7 - 25 mg/dL 41(H) 32(H)  32(H)  Creatinine 0.60 - 0.88 mg/dL 1.74(H) 1.92(H) 1.92(H)  Sodium 135 - 146 mmol/L 140 142 142  Potassium 3.5 - 5.3 mmol/L 4.8 4.6 4.6  Chloride 98 - 110 mmol/L 105 107 107  CO2 20 - 32 mmol/L 29 28 28   Calcium 8.6 - 10.4 mg/dL 10.2 9.9 9.9  Total Protein 6.1 - 8.1 g/dL - 6.2 6.2  Total Bilirubin 0.2 - 1.2 mg/dL - 0.4 0.4  Alkaline Phos 38 - 126 U/L - - -  AST 10 - 35 U/L - 25 25  ALT 6 - 29 U/L - 18 18      RADIOGRAPHIC STUDIES: I have personally reviewed the radiological images as listed and agreed with the findings in the report. No results found.   ASSESSMENT & PLAN: JAYLINA RAMDASS is a 86 y.o. female with   1. left breast DCIS, ER/PR positive -She has a h/o left breast cancer diagnosed in 1995, treated with left lumpectomy, RT and 5 years of Tamoxifen.  -She had Left breast DCIS diagnosed in 05/2014. She was treated with left lumpectomy. I started her on antiestrogen with Tamoxifen in 07/2014. Due to Zoloft interaction she was switched to anastrozole a few months later. She completed 5 years 07/2019.  -Continue surveillance. Next mammogram in 07/2020.   2. Osteopenia -11/2014 and 07/14/2017 DEXA showed osteopenia, she previously  declined bisphosphonate  -continue calcium and vitamin D -repeat DEXA 07/2020 with mammogram   3. Depression -She takes Ambien and Mirtazapine for sleep. And zoloft -stable  Disposition:  Ms. Awtrey is clinically doing well. Breast exam deferred on today's virtual visit, she continues self exams without changes or concerns. Recent labs are normal. Overall no clinical concern for recurrence.   She is due for annual mammogram and repeat DEXA in 07/2020, orders placed for [facility].   Continue breast cancer surveillance. Given that she has completed 5 years of treatment and surveillance, she can f/u with PCP, she prefers to be followed by [place]. She will return for breast exam in 3 months since exam was not done today, then annually.     Orders Placed This Encounter  Procedures  . MM 3D SCREEN BREAST BILATERAL    Standing Status:   Future    Standing Expiration Date:   07/20/2021    Scheduling Instructions:     [facility]    Order Specific Question:   Reason for Exam (SYMPTOM  OR DIAGNOSIS REQUIRED)    Answer:   h/o L breast DCIS    Order Specific Question:   Preferred imaging location?    Answer:   External  . DG Bone Density    Standing Status:   Future    Standing Expiration Date:   07/20/2021    Scheduling Instructions:     [facility]    Order Specific Question:   Reason for Exam (SYMPTOM  OR DIAGNOSIS REQUIRED)    Answer:   osteopenia    Order Specific Question:   Preferred imaging location?    Answer:   External   All questions were answered. The patient knows to call the clinic with any problems, questions or concerns. No barriers to learning were detected. I spent 14 minutes on today's phone call. She was given an in-person appointment in 3 months for breast exam. No indication for in-person evaluation today.     Alla Feeling, NP 07/20/20

## 2020-08-18 ENCOUNTER — Telehealth: Payer: Self-pay | Admitting: *Deleted

## 2020-08-18 ENCOUNTER — Telehealth: Payer: Self-pay

## 2020-08-18 NOTE — Telephone Encounter (Signed)
Order for screening mammo and bone density faxed to [facility] at [phone]

## 2020-08-18 NOTE — Telephone Encounter (Signed)
Reports she is having mammogram on 08/23/20 at 4:45 pm and was told orders need to be placed for her bone density scan to be done at the same place (did not say where her mammogram is being done). Forwarded message to Dr. Ernestina Penna nurse.

## 2020-08-21 ENCOUNTER — Telehealth: Payer: Self-pay | Admitting: Internal Medicine

## 2020-08-21 NOTE — Telephone Encounter (Signed)
Tried calling patient to  schedule Medicare Annual Wellness Visit (AWV) either virtually or in office.  No answer   Last AWV 08/17/19 please schedule at anytime with [facility] Nurse Health Advisor 1 or 2   This should be a 45 minute visit.

## 2020-08-23 ENCOUNTER — Ambulatory Visit (INDEPENDENT_AMBULATORY_CARE_PROVIDER_SITE_OTHER): Payer: Medicare Other | Admitting: Family Medicine

## 2020-08-23 ENCOUNTER — Encounter: Payer: Self-pay | Admitting: Family Medicine

## 2020-08-23 ENCOUNTER — Other Ambulatory Visit: Payer: Self-pay

## 2020-08-23 VITALS — BP 138/70 | HR 79 | Temp 98.3°F | Resp 16 | Ht 65.0 in | Wt 147.8 lb

## 2020-08-23 DIAGNOSIS — N949 Unspecified condition associated with female genital organs and menstrual cycle: Secondary | ICD-10-CM

## 2020-08-23 NOTE — Progress Notes (Signed)
ACUTE VISIT Chief Complaint  Patient presents with  . Vaginal Bleeding   HPI: [name] is a 86 y.o. female, who is here today complaining of possible vaginal bleeding. 3 days of intermittent blood on underwear lining. She has not noted nose bleed,gum bleeding,blood in stool, gross hematuria,or melena. Negative for fever,fatigue,abnormal wt loss,abdominal pain,N/V,or urinary symptoms.  She also noted a perineal skin lesion that she thinks may have bled. No hx of trauma. She has not used OTC medications.  S/P hysterectomy.  Review of Systems  Constitutional: Negative for activity change and appetite change.  Genitourinary: Negative for dysuria, frequency, urgency and vaginal discharge.  Musculoskeletal: Negative for gait problem and myalgias.  Neurological: Negative for syncope, weakness and headaches.  Psychiatric/Behavioral: Negative for confusion.  Rest see pertinent positives and negatives per HPI.  Current Outpatient Medications on File Prior to Visit  Medication Sig Dispense Refill  . AMBIEN 10 MG tablet TAKE 1/2 TABLET BY MOUTH AT BEDTIME. AVOID DAILY USE. MAY TAKE AN ADDITIONAL 1/2 TABLET IF NEEDED 90 tablet 0  . BIOTIN PO Take 2,000 mcg by mouth daily.    Marland Kitchen CALCIUM PO Take 1,000 mg by mouth.    . cetirizine (ZYRTEC) 10 MG tablet Take 10 mg by mouth daily.    . Cholecalciferol (VITAMIN D3) 2000 UNITS TABS Take 1 tablet by mouth daily.    Marland Kitchen dicyclomine (BENTYL) 20 MG tablet TAKE 1 TABLET BY MOUTH THREE TIMES DAILY BEFORE MEALS AND AT BEDTIME 270 tablet 1  . famotidine (PEPCID) 20 MG tablet Take 1 tablet (20 mg total) by mouth 2 (two) times daily. 180 tablet 1  . fish oil-omega-3 fatty acids 1000 MG capsule Take 1 g by mouth daily.     . fluocinonide-emollient (LIDEX-E) 0.05 % cream Apply 1 application topically 2 (two) times daily. As  Directed. 30 g 0  . mirtazapine (REMERON) 15 MG tablet Take 1 to 2 tablets by mouth at bedtime for sleep 180 tablet 0  .  moxifloxacin (VIGAMOX) 0.5 % ophthalmic solution moxifloxacin 0.5 % eye drops    . Multiple Vitamin (MULTIVITAMIN) tablet Take 1 tablet by mouth daily.    . Probiotic Product ([employer]S PROBIOTIC) CHEW Chew 2 capsules by mouth daily.    . rosuvastatin (CRESTOR) 5 MG tablet TAKE 1 TABLET BY MOUTH EVERY DAY 90 tablet 0  . sertraline (ZOLOFT) 100 MG tablet TAKE 1 TABLET(100 MG) BY MOUTH DAILY 90 tablet 1  . SHINGRIX injection      No current facility-administered medications on file prior to visit.   Past Medical History:  Diagnosis Date  . Breast cancer (HCC)    hx of left with radiation and lumpectomy  . Diverticulosis   . GERD (gastroesophageal reflux disease)   . History of colonic polyps   . History of ERCP    and sphincterotomy for abnormal lfts and dilated biliary ducts 5/05  . Hyperlipidemia   . Migraine headache   . Pulmonary nodule   . RLS (restless legs syndrome)   . Wears contact lenses    Allergies  Allergen Reactions  . Codeine Other (See Comments)    REACTION: Intolerance w/ pain meds not cough syrup. Codeine makes her bounce off the walls. falling REACTION: Intolerance w/ pain meds not cough syrup. Codeine makes her bounce off the walls.  . Aspirin Other (See Comments)    GI issues GI issues    Social History   Socioeconomic History  . Marital status: Widowed  Spouse name: Not on file  . Number of children: 3  . Years of education: Not on file  . Highest education level: Not on file  Occupational History  . Not on file  Tobacco Use  . Smoking status: Former Smoker    Packs/day: 3.00    Years: 25.00    Pack years: 75.00    Types: Cigarettes    Quit date: 02/26/1994    Years since quitting: 26.5  . Smokeless tobacco: Never Used  . Tobacco comment: quit 24 years   Vaping Use  . Vaping Use: Never used  Substance and Sexual Activity  . Alcohol use: No  . Drug use: No  . Sexual activity: Not on file  Other Topics Concern  . Not on file  Social History  Narrative   Retired; widowed   2 sons   Plays bridge twice monthly   Still drives         Social Determinants of Health   Financial Resource Strain:   . Difficulty of Paying Living Expenses: Not on file  Food Insecurity:   . Worried About Charity fundraiser in the Last Year: Not on file  . Ran Out of Food in the Last Year: Not on file  Transportation Needs:   . Lack of Transportation (Medical): Not on file  . Lack of Transportation (Non-Medical): Not on file  Physical Activity:   . Days of Exercise per Week: Not on file  . Minutes of Exercise per Session: Not on file  Stress:   . Feeling of Stress : Not on file  Social Connections:   . Frequency of Communication with Friends and Family: Not on file  . Frequency of Social Gatherings with Friends and Family: Not on file  . Attends Religious Services: Not on file  . Active Member of Clubs or Organizations: Not on file  . Attends Archivist Meetings: Not on file  . Marital Status: Not on file    Vitals:   08/23/20 1423  BP: 138/70  Pulse: 79  Resp: 16  Temp: 98.3 F (36.8 C)  SpO2: 95%   Body mass index is 24.6 kg/m.  Physical Exam Vitals and nursing note reviewed. Exam conducted with a chaperone present.  Constitutional:      General: She is not in acute distress.    Appearance: She is well-developed.  HENT:     Head: Normocephalic and atraumatic.  Eyes:     Conjunctiva/sclera: Conjunctivae normal.  Pulmonary:     Effort: Pulmonary effort is normal. No respiratory distress.     Breath sounds: Normal breath sounds.  Abdominal:     Palpations: Abdomen is soft. There is no mass.     Tenderness: There is no abdominal tenderness.  Genitourinary:    Exam position: Lithotomy position.     Vagina: No vaginal discharge, erythema, tenderness, bleeding or lesions.       Comments: Vaginal atrophy. Discomfort with spiculum examination,so inspection was limited.  Lymphadenopathy:     Cervical: No cervical  adenopathy.     Lower Body: No right inguinal adenopathy. No left inguinal adenopathy.  Skin:    General: Skin is warm.     Findings: No ecchymosis or erythema.  Neurological:     Mental Status: She is alert and oriented to person, place, and time.   ASSESSMENT AND PLAN:  Mr. Viha was seen today for vaginal bleeding.  Diagnoses and all orders for this visit:  Genital lesion, female  Source  of bleeding cannot be determine on examination today. Skin lesion is not activity bleeding but seems irritated. Keeps area clean with water. Avoid irritating.scratching lesion. If lesion is not healed in 3-4 weeks, instructed to follow with PCP. She voices understanding and agrees with plan.  Return in about 4 weeks (around 09/20/2020) for pcp.   Niralya Ohanian G. Martinique, MD  [facility]. [place] office.  A few things to remember from today's visit:   Skin lesion  Bleeding seems to be caused by skin lesion around genital area. I did not see blood in vaginal.  Monitor for new symptoms. If not healed in 4-6 weeks we will nee to consider biopsy.  Please be sure medication list is accurate. If a new problem present, please set up appointment sooner than planned today.

## 2020-08-23 NOTE — Patient Instructions (Signed)
A few things to remember from today's visit:   Skin lesion  Bleeding seems to be caused by skin lesion around genital area. I did not see blood in vaginal.  Monitor for new symptoms. If not healed in 4-6 weeks we will nee to consider biopsy.  Please be sure medication list is accurate. If a new problem present, please set up appointment sooner than planned today.

## 2020-09-07 DIAGNOSIS — Z1231 Encounter for screening mammogram for malignant neoplasm of breast: Secondary | ICD-10-CM | POA: Diagnosis not present

## 2020-09-07 LAB — HM MAMMOGRAPHY

## 2020-09-18 ENCOUNTER — Other Ambulatory Visit: Payer: Self-pay | Admitting: Internal Medicine

## 2020-09-20 ENCOUNTER — Other Ambulatory Visit: Payer: Self-pay

## 2020-09-20 ENCOUNTER — Ambulatory Visit (INDEPENDENT_AMBULATORY_CARE_PROVIDER_SITE_OTHER): Payer: Medicare Other | Admitting: Internal Medicine

## 2020-09-20 ENCOUNTER — Encounter: Payer: Self-pay | Admitting: Internal Medicine

## 2020-09-20 VITALS — BP 119/81 | HR 61 | Temp 98.2°F | Resp 18 | Ht 64.0 in | Wt 145.8 lb

## 2020-09-20 DIAGNOSIS — N9089 Other specified noninflammatory disorders of vulva and perineum: Secondary | ICD-10-CM | POA: Diagnosis not present

## 2020-09-20 NOTE — Progress Notes (Signed)
Chief Complaint  Patient presents with   Follow-up    HPI: Jeanne Tran 87 y.o. come in for follow-up visit having seen Dr. Martinique a month ago for examination of possible bleeding in the perineal area and noted a lesion on the left perineum labial area.  She was told to come back if the area persisted or any kind of recurrent bleeding.  When she came in last time there was spots of blood on her mini pad but no specific vaginal bleeding or dysuria hematuria.  See note. Since then there has been no bleeding at all and she thinks the lesion still there but cannot see it with the mirror very easily there is no pain or itching.  ROS: See pertinent positives and negatives per HPI.  Past Medical History:  Diagnosis Date   Breast cancer ([place])    hx of left with radiation and lumpectomy   Diverticulosis    GERD (gastroesophageal reflux disease)    History of colonic polyps    History of ERCP    and sphincterotomy for abnormal lfts and dilated biliary ducts 5/05   Hyperlipidemia    Migraine headache    Pulmonary nodule    RLS (restless legs syndrome)    Wears contact lenses     Family History  Problem Relation Age of Onset   Mitral valve prolapse Son        with afib to have surgery    Social History   Socioeconomic History   Marital status: Widowed    Spouse name: Not on file   Number of children: 3   Years of education: Not on file   Highest education level: Not on file  Occupational History   Not on file  Tobacco Use   Smoking status: Former Smoker    Packs/day: 3.00    Years: 25.00    Pack years: 75.00    Types: Cigarettes    Quit date: 02/26/1994    Years since quitting: 26.5   Smokeless tobacco: Never Used   Tobacco comment: quit 24 years   Vaping Use   Vaping Use: Never used  Substance and Sexual Activity   Alcohol use: No   Drug use: No   Sexual activity: Not on file  Other Topics Concern   Not on file  Social History  Narrative   Retired; widowed   2 sons   Plays bridge twice monthly   Still drives         Social Determinants of Health   Financial Resource Strain: Not on file  Food Insecurity: Not on file  Transportation Needs: Not on file  Physical Activity: Not on file  Stress: Not on file  Social Connections: Not on file    Outpatient Medications Prior to Visit  Medication Sig Dispense Refill   AMBIEN 10 MG tablet TAKE 1/2 TABLET BY MOUTH AT BEDTIME. AVOID DAILY USE. MAY TAKE AN ADDITIONAL 1/2 TABLET IF NEEDED 90 tablet 0   BIOTIN PO Take 2,000 mcg by mouth daily.     CALCIUM PO Take 1,000 mg by mouth.     cetirizine (ZYRTEC) 10 MG tablet Take 10 mg by mouth daily.     Cholecalciferol (VITAMIN D3) 2000 UNITS TABS Take 1 tablet by mouth daily.     dicyclomine (BENTYL) 20 MG tablet TAKE 1 TABLET BY MOUTH THREE TIMES DAILY BEFORE MEALS AND AT BEDTIME 270 tablet 1   famotidine (PEPCID) 20 MG tablet Take 1 tablet (20 mg total) by  mouth 2 (two) times daily. 180 tablet 1   fish oil-omega-3 fatty acids 1000 MG capsule Take 1 g by mouth daily.     fluocinonide-emollient (LIDEX-E) 0.05 % cream Apply 1 application topically 2 (two) times daily. As  Directed. 30 g 0   mirtazapine (REMERON) 15 MG tablet TAKE 1 TO 2 TABLETS BY MOUTH AT BEDTIME FOR SLEEP 180 tablet 0   moxifloxacin (VIGAMOX) 0.5 % ophthalmic solution moxifloxacin 0.5 % eye drops     Multiple Vitamin (MULTIVITAMIN) tablet Take 1 tablet by mouth daily.     Probiotic Product (CVS PROBIOTIC) CHEW Chew 2 capsules by mouth daily.     rosuvastatin (CRESTOR) 5 MG tablet TAKE 1 TABLET BY MOUTH EVERY DAY 90 tablet 0   sertraline (ZOLOFT) 100 MG tablet TAKE 1 TABLET(100 MG) BY MOUTH DAILY 90 tablet 1   SHINGRIX injection      No facility-administered medications prior to visit.     EXAM:  BP 119/81    Pulse 61    Temp 98.2 F (36.8 C) (Oral)    Resp 18    Ht 5\' 4"  (1.626 m)    Wt 145 lb 12.8 oz (66.1 kg)    SpO2 90%    BMI  25.03 kg/m   Body mass index is 25.03 kg/m.  GENERAL: vitals reviewed and listed above, alert, oriented, appears well hydrated and in no acute distress HEENT: atraumatic, conjunctiva  clear, no obvious abnormalities on inspection of external nose and ears MS: moves all extremities without noticeable focal  abnormality PSYCH: pleasant and cooperative, no obvious depression or anxiety External GU basically normal except for a small 3 mm skin tag that soft small base under magnification is slightly dusky but no blood irregularity or tenderness it is not firm to touch.  External GU other wise is normal as well as vaginal opening.  No adenopathy. Showed patient area with a mirror and had her feel the area.  Lab Results  Component Value Date   WBC 5.7 06/07/2020   HGB 12.4 06/07/2020   HCT 38.6 06/07/2020   PLT 190 06/07/2020   GLUCOSE 96 07/05/2020   CHOL 161 06/07/2020   TRIG 122 06/07/2020   HDL 72 06/07/2020   LDLCALC 69 06/07/2020   ALT 18 06/07/2020   ALT 18 06/07/2020   AST 25 06/07/2020   AST 25 06/07/2020   NA 140 07/05/2020   K 4.8 07/05/2020   CL 105 07/05/2020   CREATININE 1.74 (H) 07/05/2020   BUN 41 (H) 07/05/2020   CO2 29 07/05/2020   TSH 3.73 12/27/2015   INR 1.00 06/29/2014   HGBA1C 6.3 01/22/2019   BP Readings from Last 3 Encounters:  09/20/20 119/81  08/23/20 138/70  06/07/20 122/70    ASSESSMENT AND PLAN:  Discussed the following assessment and plan:  Skin tag of female perineum I think this lesion is a  simple skin tag that may have gotten traumatized irritated and therefore bled and looks pretty benign today.and small  If recurrent problem pain growing bleeding anything concerning  we can arrange for removal(? and biopsy) but I really do not think this is necessary today. We can always refer to GYN or  do an office procedure depending on concerns.  -Patient advised to return or notify health care team  if  new concerns arise.  Patient Instructions   Follow-up or contact our office if any recurrence of symptoms or see bleeding pain growing change. This looks like a benign simple  skin tag.    Standley Brooking. Quincey Quesinberry M.D.

## 2020-09-21 NOTE — Patient Instructions (Signed)
Follow-up or contact our office if any recurrence of symptoms or see bleeding pain growing change. This looks like a benign simple skin tag.

## 2020-10-02 ENCOUNTER — Other Ambulatory Visit: Payer: Self-pay | Admitting: Hematology

## 2020-10-02 ENCOUNTER — Other Ambulatory Visit: Payer: Self-pay | Admitting: Internal Medicine

## 2020-10-02 DIAGNOSIS — F4322 Adjustment disorder with anxiety: Secondary | ICD-10-CM

## 2020-10-09 ENCOUNTER — Encounter: Payer: Self-pay | Admitting: Internal Medicine

## 2020-10-09 LAB — HM MAMMOGRAPHY

## 2020-10-16 ENCOUNTER — Encounter: Payer: Self-pay | Admitting: Internal Medicine

## 2020-10-17 ENCOUNTER — Encounter: Payer: Self-pay | Admitting: Internal Medicine

## 2020-10-17 ENCOUNTER — Other Ambulatory Visit: Payer: Self-pay | Admitting: Radiology

## 2020-10-17 DIAGNOSIS — N641 Fat necrosis of breast: Secondary | ICD-10-CM | POA: Diagnosis not present

## 2020-10-17 DIAGNOSIS — R921 Mammographic calcification found on diagnostic imaging of breast: Secondary | ICD-10-CM | POA: Diagnosis not present

## 2020-10-17 LAB — HM MAMMOGRAPHY: HM Mammogram: ABNORMAL — AB (ref 0–4)

## 2020-11-08 ENCOUNTER — Other Ambulatory Visit: Payer: Self-pay | Admitting: Internal Medicine

## 2020-11-29 ENCOUNTER — Telehealth: Payer: Self-pay | Admitting: Internal Medicine

## 2020-11-29 NOTE — Telephone Encounter (Signed)
Patient states she needs a refill on Ambien and Mirtazapine.  Pharmacy: [facility] on [address]  Please advise.

## 2020-12-04 ENCOUNTER — Other Ambulatory Visit: Payer: Self-pay

## 2020-12-04 MED ORDER — MIRTAZAPINE 15 MG PO TABS: ORAL_TABLET | ORAL | 0 refills | Status: DC

## 2020-12-04 NOTE — Telephone Encounter (Signed)
The patient is completely out of her medication and she would like to have these medication refilled today please.  AMBIEN 10 MG tablet  mirtazapine (REMERON) 15 MG tablet  [id number] [address], [address] AT [facility] Phone:  [phone]  Fax:  [phone]

## 2020-12-04 NOTE — Telephone Encounter (Signed)
Mirtazapine 15mg  has been sent to pharmacy.  Please send refill for Ambien 10mg 

## 2020-12-05 MED ORDER — AMBIEN 10 MG PO TABS: ORAL_TABLET | ORAL | 0 refills | Status: DC

## 2020-12-05 NOTE — Telephone Encounter (Signed)
LVM informing meds were sent to pharmacy

## 2020-12-05 NOTE — Telephone Encounter (Signed)
Sent in electronically .  

## 2020-12-07 NOTE — Telephone Encounter (Signed)
Patient is calling and stated that the pharmacy wouldn't fill her prescription for Ambien and is asking for a call back, please advise. CB is [phone]

## 2020-12-08 NOTE — Progress Notes (Signed)
Subjective:   Jeanne Tran is a 87 y.o. female who presents for Medicare Annual (Subsequent) preventive examination.  I connected with Jeanne Tran today by telephone and verified that I am speaking with the correct person using two identifiers. Location patient: home Location provider: work Persons participating in the virtual visit: patient, provider.   I discussed the limitations, risks, security and privacy concerns of performing an evaluation and management service by telephone and the availability of in person appointments. I also discussed with the patient that there may be a patient responsible charge related to this service. The patient expressed understanding and verbally consented to this telephonic visit.    Interactive audio and video telecommunications were attempted between this provider and patient, however failed, due to patient having technical difficulties OR patient did not have access to video capability.  We continued and completed visit with audio only.      Review of Systems    N/A  Cardiac Risk Factors include: advanced age (>39men, >56 women);dyslipidemia     Objective:    Today's Vitals   There is no height or weight on file to calculate BMI.  Advanced Directives 12/11/2020 08/17/2019 07/22/2018 11/06/2017 10/01/2016 03/22/2016 09/13/2015  Does Patient Have a Medical Advance Directive? Yes Yes Yes Yes Yes Yes No  Type of Paramedic of [place];Living will [place];Living will - -  Does patient want to make changes to medical advance directive? No - Patient declined No - Patient declined - - - - -  Copy of [place] in Chart? No - copy requested No - copy requested - No - copy requested Yes No - copy requested -  Would patient like information on creating a medical advance directive? - - - - - - No - patient declined information    Current Medications  (verified) Outpatient Encounter Medications as of 12/11/2020  Medication Sig  . AMBIEN 10 MG tablet TAKE 1/2 TABLET BY MOUTH AT BEDTIME. AVOID DAILY USE. MAY TAKE AN ADDITIONAL 1/2 TABLET IF NEEDED  . BIOTIN PO Take 2,000 mcg by mouth daily.  Marland Kitchen CALCIUM PO Take 1,000 mg by mouth.  . cetirizine (ZYRTEC) 10 MG tablet Take 10 mg by mouth daily.  . Cholecalciferol (VITAMIN D3) 2000 UNITS TABS Take 1 tablet by mouth daily.  Marland Kitchen dicyclomine (BENTYL) 20 MG tablet TAKE 1 TABLET BY MOUTH THREE TIMES DAILY BEFORE MEALS AND AT BEDTIME  . famotidine (PEPCID) 20 MG tablet Take 1 tablet (20 mg total) by mouth 2 (two) times daily.  . fish oil-omega-3 fatty acids 1000 MG capsule Take 1 g by mouth daily.  . mirtazapine (REMERON) 15 MG tablet TAKE 1 TO 2 TABLETS BY MOUTH AT BEDTIME FOR SLEEP  . Multiple Vitamin (MULTIVITAMIN) tablet Take 1 tablet by mouth daily.  . Probiotic Product ([employer] PROBIOTIC) CHEW Chew 2 capsules by mouth daily.  . rosuvastatin (CRESTOR) 5 MG tablet TAKE 1 TABLET BY MOUTH EVERY DAY  . sertraline (ZOLOFT) 100 MG tablet TAKE 1 TABLET(100 MG) BY MOUTH DAILY  . fluocinonide-emollient (LIDEX-E) 0.05 % cream Apply 1 application topically 2 (two) times daily. As  Directed. (Patient not taking: Reported on 12/11/2020)  . [DISCONTINUED] moxifloxacin (VIGAMOX) 0.5 % ophthalmic solution moxifloxacin 0.5 % eye drops  . [DISCONTINUED] SHINGRIX injection    No facility-administered encounter medications on file as of 12/11/2020.    Allergies (verified) Codeine and Aspirin   History: Past Medical History:  Diagnosis Date  . Breast cancer (HCC)    hx of left with radiation and lumpectomy  . Diverticulosis   . GERD (gastroesophageal reflux disease)   . History of colonic polyps   . History of ERCP    and sphincterotomy for abnormal lfts and dilated biliary ducts 5/05  . Hyperlipidemia   . Migraine headache   . Pulmonary nodule   . RLS (restless legs syndrome)   . Wears contact lenses    Past  Surgical History:  Procedure Laterality Date  . ABDOMINAL HYSTERECTOMY     bso  . APPENDECTOMY    . BREAST LUMPECTOMY  1995   lt lumpsnbx  . BREAST LUMPECTOMY WITH RADIOACTIVE SEED LOCALIZATION Left 07/01/2014   Procedure: LEFT BREAST LUMPECTOMY WITH RADIOACTIVE SEED LOCALIZATION;  Surgeon: [name], MD;  Location: [facility];  Service: General;  Laterality: Left;  . CERVICAL SPINE SURGERY  3/03  . CHOLECYSTECTOMY  2012  . Complete Hysterectomy    . CYSTOCELE REPAIR    . ERCP     for abnormal lfts and dilated biliary ducts 5/05  . gallbladder duct open  2006  . HEMORRHOID SURGERY    . HYSTEROSCOPY    . LYMPH NODE BIOPSY    . Orthoscopic Rt Knee    . SPHINCTEROTOMY     for abnormal lfts and dilated biliary ducts 5/05  . tonsillectomy     Family History  Problem Relation Age of Onset  . Mitral valve prolapse Son        with afib to have surgery   Social History   Socioeconomic History  . Marital status: Widowed    Spouse name: Not on file  . Number of children: 3  . Years of education: Not on file  . Highest education level: Not on file  Occupational History  . Not on file  Tobacco Use  . Smoking status: Former Smoker    Packs/day: 3.00    Years: 25.00    Pack years: 75.00    Types: Cigarettes    Quit date: 02/26/1994    Years since quitting: 26.8  . Smokeless tobacco: Never Used  . Tobacco comment: quit 24 years   Vaping Use  . Vaping Use: Never used  Substance and Sexual Activity  . Alcohol use: No  . Drug use: No  . Sexual activity: Not on file  Other Topics Concern  . Not on file  Social History Narrative   Retired; widowed   2 sons   Plays bridge twice monthly   Still drives         Social Determinants of Health   Financial Resource Strain: Low Risk   . Difficulty of Paying Living Expenses: Not hard at all  Food Insecurity: No Food Insecurity  . Worried About Charity fundraiser in the Last Year: Never true  . Ran Out of  Food in the Last Year: Never true  Transportation Needs: No Transportation Needs  . Lack of Transportation (Medical): No  . Lack of Transportation (Non-Medical): No  Physical Activity: Insufficiently Active  . Days of Exercise per Week: 2 days  . Minutes of Exercise per Session: 30 min  Stress: No Stress Concern Present  . Feeling of Stress : Not at all  Social Connections: Socially Isolated  . Frequency of Communication with Friends and Family: Once a week  . Frequency of Social Gatherings with Friends and Family: Never  . Attends Religious Services: Never  . Active Member  of Clubs or Organizations: Yes  . Attends Archivist Meetings: More than 4 times per year  . Marital Status: Widowed    Tobacco Counseling Counseling given: Not Answered Comment: quit 24 years    Clinical Intake:  Pre-visit preparation completed: Yes  Pain : No/denies pain     Nutritional Risks: Unintentional weight loss (has lost 8-10 lbs over last several months) Diabetes: No  How often do you need to have someone help you when you read instructions, pamphlets, or other written materials from your doctor or pharmacy?: 1 - Never What is the last grade level you completed in school?: 2 years College  Diabetic?No     Information entered by :: [place] of Daily Living In your present state of health, do you have any difficulty performing the following activities: 12/11/2020  Hearing? N  Vision? N  Difficulty concentrating or making decisions? N  Walking or climbing stairs? N  Dressing or bathing? N  Doing errands, shopping? N  Preparing Food and eating ? N  Using the Toilet? N  In the past six months, have you accidently leaked urine? N  Do you have problems with loss of bowel control? N  Managing your Medications? N  Managing your Finances? N  Housekeeping or managing your Housekeeping? N  Some recent data might be hidden    Patient Care Team: Panosh, Standley Brooking,  MD as PCP - General Dyke Maes, OD (Optometry) Kary Kos, MD (Neurosurgery) [name], MD as Consulting Physician (General Surgery) Truitt Merle, MD as Consulting Physician (Hematology) Daryll Brod, MD as Consulting Physician (Orthopedic Surgery) Ladene Artist, MD as Consulting Physician (Gastroenterology) Warden Fillers, MD as Consulting Physician (Ophthalmology) Harriett Sine, MD as Consulting Physician (Dermatology)  Indicate any recent Medical Services you may have received from other than [facility] providers in the past year (date may be approximate).     Assessment:   This is a routine wellness examination for [place].  Hearing/Vision screen  Hearing Screening   125Hz  250Hz  500Hz  1000Hz  2000Hz  3000Hz  4000Hz  6000Hz  8000Hz   Right ear:           Left ear:           Vision Screening Comments: Patient states gets eyes examined once per year.Currently wears reading eye glasses    Dietary issues and exercise activities discussed: Current Exercise Habits: Home exercise routine, Type of exercise: walking, Time (Minutes): 30, Frequency (Times/Week): 2, Weekly Exercise (Minutes/Week): 60, Intensity: Mild, Exercise limited by: None identified  Goals    . Increase physical activity     Recommend starting with light exercise in home and walking outside when able.     . Patient Stated     Try to control the diarrhea; discuss with Dr. Mal Amabile 3 times as opposed to 4 times because it is making her drowsy  May feel better with med adjustment  Drink plenty of fluids     . Reduce fat intake to X grams per day     Drinks a lot of water -  Decrease sweet and fat intake       Depression Screen PHQ 2/9 Scores 12/11/2020 08/17/2019 05/19/2019 07/22/2018 12/10/2016 12/27/2015 10/18/2014  PHQ - 2 Score 0 0 0 0 0 0 0    Fall Risk Fall Risk  12/11/2020 08/17/2019 05/19/2019 08/05/2018 07/22/2018  Falls in the past year? 0 0 0 1 1  Comment - - - Emmi Telephone Survey: data to  providers prior to load  fell yesterday; putting up Halloween things; slipped   Number falls in past yr: 0 - 0 1 0  Comment - - - Emmi Telephone Survey Actual Response = 1 -  Injury with Fall? 0 - 0 0 -  Risk for fall due to : History of fall(s);Impaired balance/gait Medication side effect - - (No Data)  Risk for fall due to: Comment has to turn slowly - - - slipped   Follow up Falls evaluation completed;Falls prevention discussed Falls evaluation completed;Education provided;Falls prevention discussed Falls evaluation completed - Education provided    FALL RISK PREVENTION PERTAINING TO THE HOME:  Any stairs in or around the home? No  If so, are there any without handrails? No  Home free of loose throw rugs in walkways, pet beds, electrical cords, etc? Yes  Adequate lighting in your home to reduce risk of falls? Yes   ASSISTIVE DEVICES UTILIZED TO PREVENT FALLS:  Life alert? No  Use of a cane, walker or w/c? No  Grab bars in the bathroom? Yes  Shower chair or bench in shower? No  Elevated toilet seat or a handicapped toilet? No     Cognitive Function:  Normal cognitive status assessed by direct observation by this Nurse Health Advisor. No abnormalities found.   MMSE - Mini Mental State Exam 07/22/2018 12/10/2016  Not completed: (No Data) (No Data)     6CIT Screen 08/17/2019  What Year? 0 points  What month? 0 points  What time? 0 points  Count back from 20 0 points  Months in reverse 0 points  Repeat phrase 0 points  Total Score 0    Immunizations Immunization History  Administered Date(s) Administered  . Fluad Quad(high Dose 65+) 06/02/2019, 06/07/2020  . H1N1 08/29/2008  . Influenza Whole 09/16/1997, 05/16/2012  . Influenza, High Dose Seasonal PF 06/16/2014, 05/20/2017, 06/05/2018  . Influenza,inj,Quad PF,6+ Mos 05/18/2013, 05/15/2015, 05/29/2016  . Influenza-Unspecified 05/18/2013, 05/20/2017  . PFIZER(Purple Top)SARS-COV-2 Vaccination 11/09/2019, 11/30/2019,  05/11/2020  . Pneumococcal Conjugate-13 12/14/2013  . Pneumococcal-Unspecified 10/28/2000  . Td 09/16/2001, 12/14/2013  . Tetanus 12/14/2013  . Zoster 05/29/2016  . Zoster Recombinat (Shingrix) 06/17/2019    TDAP status: Up to date  Flu Vaccine status: Up to date  Pneumococcal vaccine status: Up to date  Covid-19 vaccine status: Completed vaccines  Qualifies for Shingles Vaccine? Yes   Zostavax completed Yes   Shingrix Completed?: Yes  Screening Tests Health Maintenance  Topic Date Due  . COVID-19 Vaccine (4 - Booster for [place] series) 11/11/2020  . MAMMOGRAM  10/17/2022  . TETANUS/TDAP  12/15/2023  . INFLUENZA VACCINE  Completed  . DEXA SCAN  Completed  . PNA vac Low Risk Adult  Completed  . HPV VACCINES  Aged Out    Health Maintenance  Health Maintenance Due  Topic Date Due  . COVID-19 Vaccine (4 - Booster for Pfizer series) 11/11/2020    Colorectal cancer screening: No longer required.   Mammogram status: Ordered 07/20/2020. Pt provided with contact info and advised to call to schedule appt.   Bone Density status: Ordered 07/20/2020. Pt provided with contact info and advised to call to schedule appt.  Lung Cancer Screening: (Low Dose CT Chest recommended if Age 11-80 years, 30 pack-year currently smoking OR have quit w/in 15years.) does not qualify.   Lung Cancer Screening Referral: N/A   Additional Screening:  Hepatitis C Screening: does not qualify;   Vision Screening: Recommended annual ophthalmology exams for early detection of glaucoma and other disorders of the eye.  Is the patient up to date with their annual eye exam?  Yes  Who is the provider or what is the name of the office in which the patient attends annual eye exams? Dr. Katy Fitch  If pt is not established with a provider, would they like to be referred to a provider to establish care? No .   Dental Screening: Recommended annual dental exams for proper oral hygiene  Community Resource Referral  / Chronic Care Management: CRR required this visit?  No   CCM required this visit?  No      Plan:     I have personally reviewed and noted the following in the patient's chart:   . Medical and social history . Use of alcohol, tobacco or illicit drugs  . Current medications and supplements . Functional ability and status . Nutritional status . Physical activity . Advanced directives . List of other physicians . Hospitalizations, surgeries, and ER visits in previous 12 months . Vitals . Screenings to include cognitive, depression, and falls . Referrals and appointments  In addition, I have reviewed and discussed with patient certain preventive protocols, quality metrics, and best practice recommendations. A written personalized care plan for preventive services as well as general preventive health recommendations were provided to patient.     Ofilia Neas, LPN   04/26/5725   Nurse Notes: None

## 2020-12-08 NOTE — Progress Notes (Signed)
Called patient lvm to call back , to inform that prior authorization has been complete.  PA information below.  [id number] PCN-[id number] [id number] [facility]- [id number]    [facility] has received your information, and the request will be reviewed. You may close this dialog, return to your dashboard, and perform other tasks.  You will receive an electronic determination in CoverMyMeds. You can see the latest determination by locating this request on your dashboard or by reopening this request. You will also receive a faxed copy of the determination. If you have any questions please contact [facility] at [phone].  If you need assistance, please chat with CoverMyMeds or call us at [phone]

## 2020-12-08 NOTE — Telephone Encounter (Signed)
Called [facility], a prior authorization has to be completed for the brand name Ambien.   Will inform patient and complete.

## 2020-12-11 ENCOUNTER — Ambulatory Visit (INDEPENDENT_AMBULATORY_CARE_PROVIDER_SITE_OTHER): Payer: Medicare Other

## 2020-12-11 DIAGNOSIS — Z0000 Encounter for general adult medical examination without abnormal findings: Secondary | ICD-10-CM | POA: Diagnosis not present

## 2020-12-11 NOTE — Patient Instructions (Signed)
Jeanne Tran , Thank you for taking time to come for your Medicare Wellness Visit. I appreciate your ongoing commitment to your health goals. Please review the following plan we discussed and let me know if I can assist you in the future.   Screening recommendations/referrals: Colonoscopy: No longer required  Mammogram: Up to date, next due 10/17/2021 Bone Density: No longer required  Recommended yearly ophthalmology/optometry visit for glaucoma screening and checkup Recommended yearly dental visit for hygiene and checkup  Vaccinations: Influenza vaccine: Up to date, next due fall 2022  Pneumococcal vaccine: Completed series  Tdap vaccine: Up to date, next due 12/15/2023 Shingles vaccine: Completed series     Advanced directives: Please bring in copy of your advanced medical directives so that we can scan into your chart.   Conditions/risks identified: None   Next appointment: None    Preventive Care 65 Years and Older, Female Preventive care refers to lifestyle choices and visits with your health care provider that can promote health and wellness. What does preventive care include?  A yearly physical exam. This is also called an annual well check.  Dental exams once or twice a year.  Routine eye exams. Ask your health care provider how often you should have your eyes checked.  Personal lifestyle choices, including:  Daily care of your teeth and gums.  Regular physical activity.  Eating a healthy diet.  Avoiding tobacco and drug use.  Limiting alcohol use.  Practicing safe sex.  Taking low-dose aspirin every day.  Taking vitamin and mineral supplements as recommended by your health care provider. What happens during an annual well check? The services and screenings done by your health care provider during your annual well check will depend on your age, overall health, lifestyle risk factors, and family history of disease. Counseling  Your health care provider may  ask you questions about your:  Alcohol use.  Tobacco use.  Drug use.  Emotional well-being.  Home and relationship well-being.  Sexual activity.  Eating habits.  History of falls.  Memory and ability to understand (cognition).  Work and work Statistician.  Reproductive health. Screening  You may have the following tests or measurements:  Height, weight, and BMI.  Blood pressure.  Lipid and cholesterol levels. These may be checked every 5 years, or more frequently if you are over 24 years old.  Skin check.  Lung cancer screening. You may have this screening every year starting at age 23 if you have a 30-pack-year history of smoking and currently smoke or have quit within the past 15 years.  Fecal occult blood test (FOBT) of the stool. You may have this test every year starting at age 12.  Flexible sigmoidoscopy or colonoscopy. You may have a sigmoidoscopy every 5 years or a colonoscopy every 10 years starting at age 50.  Hepatitis C blood test.  Hepatitis B blood test.  Sexually transmitted disease (STD) testing.  Diabetes screening. This is done by checking your blood sugar (glucose) after you have not eaten for a while (fasting). You may have this done every 1-3 years.  Bone density scan. This is done to screen for osteoporosis. You may have this done starting at age 39.  Mammogram. This may be done every 1-2 years. Talk to your health care provider about how often you should have regular mammograms. Talk with your health care provider about your test results, treatment options, and if necessary, the need for more tests. Vaccines  Your health care provider may recommend certain  vaccines, such as:  Influenza vaccine. This is recommended every year.  Tetanus, diphtheria, and acellular pertussis (Tdap, Td) vaccine. You may need a Td booster every 10 years.  Zoster vaccine. You may need this after age 21.  Pneumococcal 13-valent conjugate (PCV13) vaccine. One  dose is recommended after age 71.  Pneumococcal polysaccharide (PPSV23) vaccine. One dose is recommended after age 43. Talk to your health care provider about which screenings and vaccines you need and how often you need them. This information is not intended to replace advice given to you by your health care provider. Make sure you discuss any questions you have with your health care provider. Document Released: 09/29/2015 Document Revised: 05/22/2016 Document Reviewed: 07/04/2015 Elsevier Interactive Patient Education  2017 [place] Prevention in the Home Falls can cause injuries. They can happen to people of all ages. There are many things you can do to make your home safe and to help prevent falls. What can I do on the outside of my home?  Regularly fix the edges of walkways and driveways and fix any cracks.  Remove anything that might make you trip as you walk through a door, such as a raised step or threshold.  Trim any bushes or trees on the path to your home.  Use bright outdoor lighting.  Clear any walking paths of anything that might make someone trip, such as rocks or tools.  Regularly check to see if handrails are loose or broken. Make sure that both sides of any steps have handrails.  Any raised decks and porches should have guardrails on the edges.  Have any leaves, snow, or ice cleared regularly.  Use sand or salt on walking paths during winter.  Clean up any spills in your garage right away. This includes oil or grease spills. What can I do in the bathroom?  Use night lights.  Install grab bars by the toilet and in the tub and shower. Do not use towel bars as grab bars.  Use non-skid mats or decals in the tub or shower.  If you need to sit down in the shower, use a plastic, non-slip stool.  Keep the floor dry. Clean up any water that spills on the floor as soon as it happens.  Remove soap buildup in the tub or shower regularly.  Attach bath  mats securely with double-sided non-slip rug tape.  Do not have throw rugs and other things on the floor that can make you trip. What can I do in the bedroom?  Use night lights.  Make sure that you have a light by your bed that is easy to reach.  Do not use any sheets or blankets that are too big for your bed. They should not hang down onto the floor.  Have a firm chair that has side arms. You can use this for support while you get dressed.  Do not have throw rugs and other things on the floor that can make you trip. What can I do in the kitchen?  Clean up any spills right away.  Avoid walking on wet floors.  Keep items that you use a lot in easy-to-reach places.  If you need to reach something above you, use a strong step stool that has a grab bar.  Keep electrical cords out of the way.  Do not use floor polish or wax that makes floors slippery. If you must use wax, use non-skid floor wax.  Do not have throw rugs and other things  on the floor that can make you trip. What can I do with my stairs?  Do not leave any items on the stairs.  Make sure that there are handrails on both sides of the stairs and use them. Fix handrails that are broken or loose. Make sure that handrails are as long as the stairways.  Check any carpeting to make sure that it is firmly attached to the stairs. Fix any carpet that is loose or worn.  Avoid having throw rugs at the top or bottom of the stairs. If you do have throw rugs, attach them to the floor with carpet tape.  Make sure that you have a light switch at the top of the stairs and the bottom of the stairs. If you do not have them, ask someone to add them for you. What else can I do to help prevent falls?  Wear shoes that:  Do not have high heels.  Have rubber bottoms.  Are comfortable and fit you well.  Are closed at the toe. Do not wear sandals.  If you use a stepladder:  Make sure that it is fully opened. Do not climb a closed  stepladder.  Make sure that both sides of the stepladder are locked into place.  Ask someone to hold it for you, if possible.  Clearly mark and make sure that you can see:  Any grab bars or handrails.  First and last steps.  Where the edge of each step is.  Use tools that help you move around (mobility aids) if they are needed. These include:  Canes.  Walkers.  Scooters.  Crutches.  Turn on the lights when you go into a dark area. Replace any light bulbs as soon as they burn out.  Set up your furniture so you have a clear path. Avoid moving your furniture around.  If any of your floors are uneven, fix them.  If there are any pets around you, be aware of where they are.  Review your medicines with your doctor. Some medicines can make you feel dizzy. This can increase your chance of falling. Ask your doctor what other things that you can do to help prevent falls. This information is not intended to replace advice given to you by your health care provider. Make sure you discuss any questions you have with your health care provider. Document Released: 06/29/2009 Document Revised: 02/08/2016 Document Reviewed: 10/07/2014 Elsevier Interactive Patient Education  2017 [employer].

## 2020-12-13 NOTE — Telephone Encounter (Signed)
Spoke with Colletta Maryland at [facility] for patients Ambien 10mg  has been approved. Pharmacy will notify patient that her prescription can be picked up.

## 2021-01-12 DIAGNOSIS — Z23 Encounter for immunization: Secondary | ICD-10-CM | POA: Diagnosis not present

## 2021-01-16 ENCOUNTER — Telehealth: Payer: Self-pay | Admitting: Internal Medicine

## 2021-01-16 MED ORDER — ROSUVASTATIN CALCIUM 5 MG PO TABS: 5.0000 mg | ORAL_TABLET | Freq: Every day | ORAL | 0 refills | Status: DC

## 2021-01-16 NOTE — Telephone Encounter (Signed)
Patient is calling and requesting a refill for rosuvastatin (CRESTOR) 5 MG tablet to be sent to   [facility] [address], [place], [place] [address]  Phone:  [phone] Fax:  [phone]  CB is [phone]

## 2021-01-16 NOTE — Telephone Encounter (Signed)
RX sent to patients pharmacy

## 2021-01-24 ENCOUNTER — Encounter: Payer: Self-pay | Admitting: Nurse Practitioner

## 2021-01-24 DIAGNOSIS — M8589 Other specified disorders of bone density and structure, multiple sites: Secondary | ICD-10-CM | POA: Diagnosis not present

## 2021-01-24 LAB — HM DEXA SCAN

## 2021-01-25 ENCOUNTER — Encounter: Payer: Self-pay | Admitting: Internal Medicine

## 2021-02-06 ENCOUNTER — Telehealth: Payer: Self-pay

## 2021-02-06 NOTE — Telephone Encounter (Signed)
This nurse spoke with patient and informed her of results of her bone scan.  Advised per provider that her scan shows no significant changes from 2018.  Advised to continue taking her Calcium and Vitamin D supplements.  Informed patient to follow up as needed and she has no future appointments scheduled at this time.  Patient acknowledged understanding. No further questions or concerns noted.

## 2021-03-25 ENCOUNTER — Other Ambulatory Visit: Payer: Self-pay | Admitting: Internal Medicine

## 2021-04-25 ENCOUNTER — Telehealth: Payer: Self-pay

## 2021-04-25 NOTE — Telephone Encounter (Signed)
Patient called stating she is out of AMBIEN 10 MG tablet Pt requesting refill be sent to [facility]

## 2021-05-01 ENCOUNTER — Telehealth: Payer: Self-pay

## 2021-05-01 NOTE — Telephone Encounter (Signed)
Request sent to Dr Regis Bill for approval

## 2021-05-02 MED ORDER — AMBIEN 10 MG PO TABS: ORAL_TABLET | ORAL | 0 refills | Status: DC

## 2021-05-02 NOTE — Telephone Encounter (Signed)
Arrange office visit appointment with  in the next 1-2 months Last visit with me was January 2022 I will refill the Ambien x1 in the interim.

## 2021-05-02 NOTE — Telephone Encounter (Signed)
Pt came into the office and was informed of the message below and verbalized understanding.

## 2021-05-02 NOTE — Addendum Note (Signed)
Addended byShanon Ace K on: 05/02/2021 09:13 AM   Modules accepted: Orders

## 2021-05-02 NOTE — Telephone Encounter (Signed)
Left a message for the pt to return my call.  

## 2021-06-03 NOTE — Progress Notes (Deleted)
No chief complaint on file.   HPI: Jeanne Tran 87 y.o. come in for  fu med eval   Wants to keep taking Lorrin Mais  has the 10 mg  No falling Renal function Hld  Famotidine and   ROS: See pertinent positives and negatives per HPI.  Past Medical History:  Diagnosis Date   Breast cancer ([place])    hx of left with radiation and lumpectomy   Diverticulosis    GERD (gastroesophageal reflux disease)    History of colonic polyps    History of ERCP    and sphincterotomy for abnormal lfts and dilated biliary ducts 5/05   Hyperlipidemia    Migraine headache    Pulmonary nodule    RLS (restless legs syndrome)    Wears contact lenses     Family History  Problem Relation Age of Onset   Mitral valve prolapse Son        with afib to have surgery    Social History   Socioeconomic History   Marital status: Widowed    Spouse name: Not on file   Number of children: 3   Years of education: Not on file   Highest education level: Not on file  Occupational History   Not on file  Tobacco Use   Smoking status: Former    Packs/day: 3.00    Years: 25.00    Pack years: 75.00    Types: Cigarettes    Quit date: 02/26/1994    Years since quitting: 27.2   Smokeless tobacco: Never   Tobacco comments:    quit 24 years   Vaping Use   Vaping Use: Never used  Substance and Sexual Activity   Alcohol use: No   Drug use: No   Sexual activity: Not on file  Other Topics Concern   Not on file  Social History Narrative   Retired; widowed   2 sons   Plays bridge twice monthly   Still drives         Social Determinants of Health   Financial Resource Strain: Low Risk    Difficulty of Paying Living Expenses: Not hard at all  Food Insecurity: No Food Insecurity   Worried About Charity fundraiser in the Last Year: Never true   Arboriculturist in the Last Year: Never true  Transportation Needs: No Transportation Needs   Lack of Transportation (Medical): No   Lack of Transportation  (Non-Medical): No  Physical Activity: Insufficiently Active   Days of Exercise per Week: 2 days   Minutes of Exercise per Session: 30 min  Stress: No Stress Concern Present   Feeling of Stress : Not at all  Social Connections: Socially Isolated   Frequency of Communication with Friends and Family: Once a week   Frequency of Social Gatherings with Friends and Family: Never   Attends Religious Services: Never   Marine scientist or Organizations: Yes   Attends Music therapist: More than 4 times per year   Marital Status: Widowed    Outpatient Medications Prior to Visit  Medication Sig Dispense Refill   AMBIEN 10 MG tablet TAKE 1/2 TABLET BY MOUTH AT BEDTIME. AVOID DAILY USE. MAY TAKE AN ADDITIONAL 1/2 TABLET IF NEEDED 90 tablet 0   BIOTIN PO Take 2,000 mcg by mouth daily.     CALCIUM PO Take 1,000 mg by mouth.     cetirizine (ZYRTEC) 10 MG tablet Take 10 mg by mouth daily.  Cholecalciferol (VITAMIN D3) 2000 UNITS TABS Take 1 tablet by mouth daily.     dicyclomine (BENTYL) 20 MG tablet TAKE 1 TABLET BY MOUTH THREE TIMES DAILY BEFORE MEALS AND AT BEDTIME 270 tablet 1   famotidine (PEPCID) 20 MG tablet Take 1 tablet (20 mg total) by mouth 2 (two) times daily. 180 tablet 1   fish oil-omega-3 fatty acids 1000 MG capsule Take 1 g by mouth daily.     fluocinonide-emollient (LIDEX-E) 0.05 % cream Apply 1 application topically 2 (two) times daily. As  Directed. (Patient not taking: Reported on 12/11/2020) 30 g 0   mirtazapine (REMERON) 15 MG tablet TAKE 1 TO 2 TABLETS BY MOUTH AT BEDTIME FOR SLEEP 180 tablet 0   Multiple Vitamin (MULTIVITAMIN) tablet Take 1 tablet by mouth daily.     Probiotic Product ([employer] PROBIOTIC) CHEW Chew 2 capsules by mouth daily.     rosuvastatin (CRESTOR) 5 MG tablet TAKE 1 TABLET(5 MG) BY MOUTH DAILY 90 tablet 0   sertraline (ZOLOFT) 100 MG tablet TAKE 1 TABLET(100 MG) BY MOUTH DAILY 90 tablet 1   No facility-administered medications prior to visit.      EXAM:  There were no vitals taken for this visit.  There is no height or weight on file to calculate BMI.  GENERAL: vitals reviewed and listed above, alert, oriented, appears well hydrated and in no acute distress HEENT: atraumatic, conjunctiva  clear, no obvious abnormalities on inspection of external nose and ears OP : no lesion edema or exudate  NECK: no obvious masses on inspection palpation  LUNGS: clear to auscultation bilaterally, no wheezes, rales or rhonchi, good air movement CV: HRRR, no clubbing cyanosis or  peripheral edema nl cap refill  MS: moves all extremities without noticeable focal  abnormality PSYCH: pleasant and cooperative, no obvious depression or anxiety Lab Results  Component Value Date   WBC 5.7 06/07/2020   HGB 12.4 06/07/2020   HCT 38.6 06/07/2020   PLT 190 06/07/2020   GLUCOSE 96 07/05/2020   CHOL 161 06/07/2020   TRIG 122 06/07/2020   HDL 72 06/07/2020   LDLCALC 69 06/07/2020   ALT 18 06/07/2020   ALT 18 06/07/2020   AST 25 06/07/2020   AST 25 06/07/2020   NA 140 07/05/2020   K 4.8 07/05/2020   CL 105 07/05/2020   CREATININE 1.74 (H) 07/05/2020   BUN 41 (H) 07/05/2020   CO2 29 07/05/2020   TSH 3.73 12/27/2015   INR 1.00 06/29/2014   HGBA1C 6.3 01/22/2019   BP Readings from Last 3 Encounters:  09/20/20 119/81  08/23/20 138/70  06/07/20 122/70    ASSESSMENT AND PLAN:  Discussed the following assessment and plan:  Stage 3b chronic kidney disease (HCC)  Medication management  High risk medication use  Seronegative arthritis  INSOMNIA, CHRONIC Zoster flu  -Patient advised to return or notify health care team  if  new concerns arise.  There are no Patient Instructions on file for this visit.   Standley Brooking. Nyana Haren M.D.

## 2021-06-04 ENCOUNTER — Ambulatory Visit: Payer: Medicare Other | Admitting: Internal Medicine

## 2021-06-13 ENCOUNTER — Ambulatory Visit (INDEPENDENT_AMBULATORY_CARE_PROVIDER_SITE_OTHER): Payer: Medicare Other | Admitting: Internal Medicine

## 2021-06-13 ENCOUNTER — Other Ambulatory Visit: Payer: Self-pay

## 2021-06-13 ENCOUNTER — Encounter: Payer: Self-pay | Admitting: Internal Medicine

## 2021-06-13 VITALS — BP 130/70 | HR 82 | Temp 98.1°F | Ht 64.0 in | Wt 146.6 lb

## 2021-06-13 DIAGNOSIS — E785 Hyperlipidemia, unspecified: Secondary | ICD-10-CM

## 2021-06-13 DIAGNOSIS — R7989 Other specified abnormal findings of blood chemistry: Secondary | ICD-10-CM

## 2021-06-13 DIAGNOSIS — G4700 Insomnia, unspecified: Secondary | ICD-10-CM

## 2021-06-13 DIAGNOSIS — N1832 Chronic kidney disease, stage 3b: Secondary | ICD-10-CM

## 2021-06-13 DIAGNOSIS — R945 Abnormal results of liver function studies: Secondary | ICD-10-CM | POA: Diagnosis not present

## 2021-06-13 DIAGNOSIS — R351 Nocturia: Secondary | ICD-10-CM

## 2021-06-13 DIAGNOSIS — Z79899 Other long term (current) drug therapy: Secondary | ICD-10-CM | POA: Diagnosis not present

## 2021-06-13 LAB — CBC WITH DIFFERENTIAL/PLATELET
Basophils Absolute: 0.1 10*3/uL (ref 0.0–0.1)
Basophils Relative: 1.4 % (ref 0.0–3.0)
Eosinophils Absolute: 0.3 10*3/uL (ref 0.0–0.7)
Eosinophils Relative: 4.2 % (ref 0.0–5.0)
HCT: 39 % (ref 36.0–46.0)
Hemoglobin: 13 g/dL (ref 12.0–15.0)
Lymphocytes Relative: 18.8 % (ref 12.0–46.0)
Lymphs Abs: 1.3 10*3/uL (ref 0.7–4.0)
MCHC: 33.3 g/dL (ref 30.0–36.0)
MCV: 94.7 fl (ref 78.0–100.0)
Monocytes Absolute: 0.6 10*3/uL (ref 0.1–1.0)
Monocytes Relative: 8.6 % (ref 3.0–12.0)
Neutro Abs: 4.6 10*3/uL (ref 1.4–7.7)
Neutrophils Relative %: 67 % (ref 43.0–77.0)
Platelets: 205 10*3/uL (ref 150.0–400.0)
RBC: 4.11 Mil/uL (ref 3.87–5.11)
RDW: 13.3 % (ref 11.5–15.5)
WBC: 6.8 10*3/uL (ref 4.0–10.5)

## 2021-06-13 LAB — BASIC METABOLIC PANEL
BUN: 25 mg/dL — ABNORMAL HIGH (ref 6–23)
CO2: 31 mEq/L (ref 19–32)
Calcium: 10.5 mg/dL (ref 8.4–10.5)
Chloride: 100 mEq/L (ref 96–112)
Creatinine, Ser: 1.59 mg/dL — ABNORMAL HIGH (ref 0.40–1.20)
GFR: 29.41 mL/min — ABNORMAL LOW (ref >60.00)
Glucose, Bld: 98 mg/dL (ref 70–99)
Potassium: 4.4 mEq/L (ref 3.5–5.1)
Sodium: 138 mEq/L (ref 135–145)

## 2021-06-13 LAB — HEPATIC FUNCTION PANEL
ALT: 24 U/L (ref 0–35)
AST: 28 U/L (ref 0–37)
Albumin: 4.2 g/dL (ref 3.5–5.2)
Alkaline Phosphatase: 105 U/L (ref 39–117)
Bilirubin, Direct: 0.1 mg/dL (ref 0.0–0.3)
Total Bilirubin: 0.4 mg/dL (ref 0.2–1.2)
Total Protein: 7.1 g/dL (ref 6.0–8.3)

## 2021-06-13 LAB — LIPID PANEL
Cholesterol: 182 mg/dL (ref 0–200)
HDL: 72.2 mg/dL (ref >39.00)
LDL Cholesterol: 86 mg/dL (ref 0–99)
NonHDL: 109.52
Total CHOL/HDL Ratio: 3
Triglycerides: 116 mg/dL (ref 0.0–149.0)
VLDL: 23.2 mg/dL (ref 0.0–40.0)

## 2021-06-13 NOTE — Progress Notes (Signed)
Chief Complaint  Patient presents with   Follow-up    HPI: Jeanne Tran 87 y.o. come in for Chronic disease management  med cjheck She continues to have chronic insomnia taking low-dose Ambien a couple times a week.  And mirtazapine most nights.  Thinks it does well.  Of note we have tried a number of different things in the past  She does not think it affects her memory in a bad way thinks her memory is okay although sometimes forgetful does not get lost plays bridge 2 times a month.  No falling.  Arthritis is about the same not taking any medicines right hand more than left hand.  Still taking Crestor denies side effects. Sertraline no change. Lives alone with her cat. Denies chest pain shortness of breath. ROS: See pertinent positives and negatives per HPI.  Past Medical History:  Diagnosis Date   Breast cancer ([place])    hx of left with radiation and lumpectomy   Diverticulosis    GERD (gastroesophageal reflux disease)    History of colonic polyps    History of ERCP    and sphincterotomy for abnormal lfts and dilated biliary ducts 5/05   Hyperlipidemia    Migraine headache    Pulmonary nodule    RLS (restless legs syndrome)    Wears contact lenses     Family History  Problem Relation Age of Onset   Mitral valve prolapse Son        with afib to have surgery    Social History   Socioeconomic History   Marital status: Widowed    Spouse name: Not on file   Number of children: 3   Years of education: Not on file   Highest education level: Not on file  Occupational History   Not on file  Tobacco Use   Smoking status: Former    Packs/day: 3.00    Years: 25.00    Pack years: 75.00    Types: Cigarettes    Quit date: 02/26/1994    Years since quitting: 27.3   Smokeless tobacco: Never   Tobacco comments:    quit 24 years   Vaping Use   Vaping Use: Never used  Substance and Sexual Activity   Alcohol use: No   Drug use: No   Sexual activity: Not on  file  Other Topics Concern   Not on file  Social History Narrative   Retired; widowed   2 sons   Plays bridge twice monthly   Still drives         Social Determinants of Health   Financial Resource Strain: Low Risk    Difficulty of Paying Living Expenses: Not hard at all  Food Insecurity: No Food Insecurity   Worried About Charity fundraiser in the Last Year: Never true   Arboriculturist in the Last Year: Never true  Transportation Needs: No Transportation Needs   Lack of Transportation (Medical): No   Lack of Transportation (Non-Medical): No  Physical Activity: Insufficiently Active   Days of Exercise per Week: 2 days   Minutes of Exercise per Session: 30 min  Stress: No Stress Concern Present   Feeling of Stress : Not at all  Social Connections: Socially Isolated   Frequency of Communication with Friends and Family: Once a week   Frequency of Social Gatherings with Friends and Family: Never   Attends Religious Services: Never   Marine scientist or Organizations: Yes   Attends [employer]  or Organization Meetings: More than 4 times per year   Marital Status: Widowed    Outpatient Medications Prior to Visit  Medication Sig Dispense Refill   AMBIEN 10 MG tablet TAKE 1/2 TABLET BY MOUTH AT BEDTIME. AVOID DAILY USE. MAY TAKE AN ADDITIONAL 1/2 TABLET IF NEEDED 90 tablet 0   BIOTIN PO Take 2,000 mcg by mouth daily.     CALCIUM PO Take 1,000 mg by mouth.     cetirizine (ZYRTEC) 10 MG tablet Take 10 mg by mouth daily.     Cholecalciferol (VITAMIN D3) 2000 UNITS TABS Take 1 tablet by mouth daily.     dicyclomine (BENTYL) 20 MG tablet TAKE 1 TABLET BY MOUTH THREE TIMES DAILY BEFORE MEALS AND AT BEDTIME 270 tablet 1   famotidine (PEPCID) 20 MG tablet Take 1 tablet (20 mg total) by mouth 2 (two) times daily. 180 tablet 1   fish oil-omega-3 fatty acids 1000 MG capsule Take 1 g by mouth daily.     fluocinonide-emollient (LIDEX-E) 0.05 % cream Apply 1 application topically 2 (two)  times daily. As  Directed. (Patient taking differently: Apply 1 application topically 2 (two) times daily. As  Directed.) 30 g 0   mirtazapine (REMERON) 15 MG tablet TAKE 1 TO 2 TABLETS BY MOUTH AT BEDTIME FOR SLEEP 180 tablet 0   Multiple Vitamin (MULTIVITAMIN) tablet Take 1 tablet by mouth daily.     Probiotic Product ([employer]S PROBIOTIC) CHEW Chew 2 capsules by mouth daily.     rosuvastatin (CRESTOR) 5 MG tablet TAKE 1 TABLET(5 MG) BY MOUTH DAILY 90 tablet 0   sertraline (ZOLOFT) 100 MG tablet TAKE 1 TABLET(100 MG) BY MOUTH DAILY 90 tablet 1   No facility-administered medications prior to visit.     EXAM:  BP 130/70 (BP Location: Left Arm, Patient Position: Sitting, Cuff Size: Normal)   Pulse 82   Temp 98.1 F (36.7 C) (Oral)   Ht 5\' 4"  (1.626 m)   Wt 146 lb 9.6 oz (66.5 kg)   SpO2 92%   BMI 25.16 kg/m   Body mass index is 25.16 kg/m.  GENERAL: vitals reviewed and listed above, alert, oriented, appears well hydrated and in no acute distress HEENT: atraumatic, conjunctiva  clear, no obvious abnormalities on inspection of external nose and ears OP : masked  NECK: no obvious masses on inspection palpation  LUNGS: clear to auscultation bilaterally, no wheezes, rales or rhonchi, good air movement CV: HRRR, no clubbing cyanosis or  peripheral edema nl cap refill  MS: moves all extremities without noticeable focal  abnormality arthritic changes right more than left some ulnar deviation but good range of motion. PSYCH: pleasant and cooperative, no obvious depression or anxiety Lab Results  Component Value Date   WBC 6.8 06/13/2021   HGB 13.0 06/13/2021   HCT 39.0 06/13/2021   PLT 205.0 06/13/2021   GLUCOSE 98 06/13/2021   CHOL 182 06/13/2021   TRIG 116.0 06/13/2021   HDL 72.20 06/13/2021   LDLCALC 86 06/13/2021   ALT 24 06/13/2021   AST 28 06/13/2021   NA 138 06/13/2021   K 4.4 06/13/2021   CL 100 06/13/2021   CREATININE 1.59 (H) 06/13/2021   BUN 25 (H) 06/13/2021   CO2 31  06/13/2021   TSH 3.73 12/27/2015   INR 1.00 06/29/2014   HGBA1C 6.3 01/22/2019   MICROALBUR <0.7 06/13/2021   BP Readings from Last 3 Encounters:  06/13/21 130/70  09/20/20 119/81  08/23/20 138/70    ASSESSMENT AND PLAN:  Discussed the following assessment and plan:  Stage 3b chronic kidney disease ([place]) - Plan: Basic metabolic panel, CBC with Differential/Platelet, Hepatic function panel, Lipid panel, Microalbumin / creatinine urine ratio, POCT Urinalysis Dipstick (Automated), POCT Urinalysis Dipstick (Automated), Microalbumin / creatinine urine ratio, Lipid panel, Hepatic function panel, CBC with Differential/Platelet, Basic metabolic panel  Medication management - Plan: Basic metabolic panel, CBC with Differential/Platelet, Hepatic function panel, Lipid panel, Microalbumin / creatinine urine ratio, POCT Urinalysis Dipstick (Automated), POCT Urinalysis Dipstick (Automated), Microalbumin / creatinine urine ratio, Lipid panel, Hepatic function panel, CBC with Differential/Platelet, Basic metabolic panel  High risk medication use - Plan: Basic metabolic panel, CBC with Differential/Platelet, Hepatic function panel, Lipid panel, Microalbumin / creatinine urine ratio, POCT Urinalysis Dipstick (Automated), POCT Urinalysis Dipstick (Automated), Microalbumin / creatinine urine ratio, Lipid panel, Hepatic function panel, CBC with Differential/Platelet, Basic metabolic panel  Abnormal LFTs - Plan: Basic metabolic panel, CBC with Differential/Platelet, Hepatic function panel, Lipid panel, Microalbumin / creatinine urine ratio, POCT Urinalysis Dipstick (Automated), POCT Urinalysis Dipstick (Automated), Microalbumin / creatinine urine ratio, Lipid panel, Hepatic function panel, CBC with Differential/Platelet, Basic metabolic panel  INSOMNIA, CHRONIC - Plan: Basic metabolic panel, CBC with Differential/Platelet, Hepatic function panel, Lipid panel, Lipid panel, Hepatic function panel, CBC with  Differential/Platelet, Basic metabolic panel  Hyperlipidemia, unspecified hyperlipidemia type - Plan: Basic metabolic panel, CBC with Differential/Platelet, Hepatic function panel, Lipid panel, Microalbumin / creatinine urine ratio, POCT Urinalysis Dipstick (Automated), POCT Urinalysis Dipstick (Automated), Microalbumin / creatinine urine ratio, Lipid panel, Hepatic function panel, CBC with Differential/Platelet, Basic metabolic panel  Nocturia - Plan: Microalbumin / creatinine urine ratio, POCT Urinalysis Dipstick (Automated), POCT Urinalysis Dipstick (Automated), Microalbumin / creatinine urine ratio Due for lab tests renal function etc. with her nocturia being a little more frequent we will get urine testing also if possible unable to give specimen even with water.  Medications discussed risk of sleep medicines she feels it is more helpful than not although close monitoring is in order. No formal memory testing done today arthritis seems stable Discussed immunizations today.  Wanted to get vaccines together. -Patient advised to return or notify health care team  if  new concerns arise. 32 minutes review discuss plan  Patient Instructions  Good to see you today .  Continued caution with sleep medications  can cause  brain fogginess.   Blood work to check kidney function  cholesterol , and  urine to check for infection etc.   If all ok then rehcheck visit in 6 months .  Can get your flu vaccine and covid booster at   pharmacy .     Standley Brooking. Lewayne Pauley M.D.

## 2021-06-13 NOTE — Progress Notes (Signed)
Blood levels showed no anemia  liver is normal  kidney function is stable although decreased.   Cholesterol is in an excellent range. If possible try to get the urine specimen at your convenience  plan follow-up office visit in 4 to 6 months.  For med check.

## 2021-06-13 NOTE — Patient Instructions (Signed)
Good to see you today .  Continued caution with sleep medications  can cause  brain fogginess.   Blood work to check kidney function  cholesterol , and  urine to check for infection etc.   If all ok then rehcheck visit in 6 months .  Can get your flu vaccine and covid booster at   pharmacy .

## 2021-06-14 LAB — MICROALBUMIN / CREATININE URINE RATIO
Creatinine,U: 87.4 mg/dL
Microalb Creat Ratio: 0.8 mg/g (ref 0.0–30.0)
Microalb, Ur: <0.7 mg/dL (ref 0.0–1.9)

## 2021-06-27 ENCOUNTER — Ambulatory Visit (INDEPENDENT_AMBULATORY_CARE_PROVIDER_SITE_OTHER): Payer: Medicare Other

## 2021-06-27 ENCOUNTER — Other Ambulatory Visit: Payer: Self-pay

## 2021-06-27 DIAGNOSIS — Z23 Encounter for immunization: Secondary | ICD-10-CM | POA: Diagnosis not present

## 2021-08-28 NOTE — Progress Notes (Addendum)
ACUTE VISIT Chief Complaint  Patient presents with   Jeanne Tran over the last week, did not trip over anything.    HPI: [name] is a 87 y.o. female with history of RA, OA, IBS, insomnia, hyperlipidemia, and adjustment disorder with anxious mood here today with her daughter complaining of lower back pain after a fall 2 weeks ago. She does not recall what caused fall.She was not walking, she remembers being up and next thing she was seated on the floor, hurting all over. She was able to get up with no help. Pain she had "all over" has resolved but mid lower back pain is still present. Lower back pain is not radiated. Negative for saddle anesthesia or changes in bowel/bladder function.  Fall The accident occurred More than 1 week ago. The fall occurred while standing. She fell from a height of 1 to 2 ft. There was no blood loss. The point of impact was the buttocks. The pain is present in the back. The pain is at a severity of 4/10. The pain is mild. The symptoms are aggravated by movement and ambulation. Pertinent negatives include no bowel incontinence, fever, headaches, hematuria, loss of consciousness, nausea, numbness, tingling, visual change or vomiting. She has tried heat and rest for the symptoms. The treatment provided mild relief.   Hx of unstable gait, she does not use a cane or walker. Lives alone.  This is her first fall in the past year. She denies urinary symptoms. Negative for head trauma or MS changes. She denies unusual headache, visual changes, CP, palpitations, dyspnea, edema, abdominal pain, nausea, vomiting, changes in bowel habits, or skin rash.  Her daughter also has questions about her medication list.  She takes Ambien 10 mg half to 1 tablet at bedtime and mirtazapine 15 mg daily at bedtime, she states that sometimes it does not help.  Her daughter wonders if dose of medication can be increased. Anxiety: She is on sertraline 100 mg daily.  She  does not recall having a pneumonia shot, she would like to have it today.  Review of Systems  Constitutional:  Negative for appetite change and fever.  Respiratory:  Negative for cough and wheezing.   Gastrointestinal:  Negative for bowel incontinence, nausea and vomiting.  Endocrine: Negative for cold intolerance and heat intolerance.  Genitourinary:  Negative for decreased urine volume, dysuria, flank pain and hematuria.  Musculoskeletal:  Positive for gait problem.  Neurological:  Negative for tingling, loss of consciousness, syncope, facial asymmetry, numbness and headaches.  Psychiatric/Behavioral:  Negative for confusion.   Rest see pertinent positives and negatives per HPI.  Current Outpatient Medications on File Prior to Visit  Medication Sig Dispense Refill   AMBIEN 10 MG tablet TAKE 1/2 TABLET BY MOUTH AT BEDTIME. AVOID DAILY USE. MAY TAKE AN ADDITIONAL 1/2 TABLET IF NEEDED 90 tablet 0   BIOTIN PO Take 2,000 mcg by mouth daily.     CALCIUM PO Take 1,000 mg by mouth.     cetirizine (ZYRTEC) 10 MG tablet Take 10 mg by mouth daily.     Cholecalciferol (VITAMIN D3) 2000 UNITS TABS Take 1 tablet by mouth daily.     dicyclomine (BENTYL) 20 MG tablet TAKE 1 TABLET BY MOUTH THREE TIMES DAILY BEFORE MEALS AND AT BEDTIME 270 tablet 1   famotidine (PEPCID) 20 MG tablet Take 1 tablet (20 mg total) by mouth 2 (two) times daily. 180 tablet 1   fish oil-omega-3 fatty acids 1000  MG capsule Take 1 g by mouth daily.     fluocinonide-emollient (LIDEX-E) 0.05 % cream Apply 1 application topically 2 (two) times daily. As  Directed. (Patient taking differently: Apply 1 application topically 2 (two) times daily. As  Directed.) 30 g 0   mirtazapine (REMERON) 15 MG tablet TAKE 1 TO 2 TABLETS BY MOUTH AT BEDTIME FOR SLEEP 180 tablet 0   Multiple Vitamin (MULTIVITAMIN) tablet Take 1 tablet by mouth daily.     Probiotic Product ([employer] PROBIOTIC) CHEW Chew 2 capsules by mouth daily.     rosuvastatin (CRESTOR)  5 MG tablet TAKE 1 TABLET(5 MG) BY MOUTH DAILY 90 tablet 0   sertraline (ZOLOFT) 100 MG tablet TAKE 1 TABLET(100 MG) BY MOUTH DAILY 90 tablet 1   No current facility-administered medications on file prior to visit.   Past Medical History:  Diagnosis Date   Breast cancer ([place])    hx of left with radiation and lumpectomy   Diverticulosis    GERD (gastroesophageal reflux disease)    History of colonic polyps    History of ERCP    and sphincterotomy for abnormal lfts and dilated biliary ducts 5/05   Hyperlipidemia    Migraine headache    Pulmonary nodule    RLS (restless legs syndrome)    Wears contact lenses    Allergies  Allergen Reactions   Codeine Other (See Comments)    REACTION: Intolerance w/ pain meds not cough syrup. Codeine makes her bounce off the walls. falling REACTION: Intolerance w/ pain meds not cough syrup. Codeine makes her bounce off the walls.   Aspirin Other (See Comments)    GI issues GI issues   Social History   Socioeconomic History   Marital status: Widowed    Spouse name: Not on file   Number of children: 3   Years of education: Not on file   Highest education level: Not on file  Occupational History   Not on file  Tobacco Use   Smoking status: Former    Packs/day: 3.00    Years: 25.00    Pack years: 75.00    Types: Cigarettes    Quit date: 02/26/1994    Years since quitting: 27.5   Smokeless tobacco: Never   Tobacco comments:    quit 24 years   Vaping Use   Vaping Use: Never used  Substance and Sexual Activity   Alcohol use: No   Drug use: No   Sexual activity: Not on file  Other Topics Concern   Not on file  Social History Narrative   Retired; widowed   2 sons   Plays bridge twice monthly   Still drives         Social Determinants of Health   Financial Resource Strain: Low Risk    Difficulty of Paying Living Expenses: Not hard at all  Food Insecurity: No Food Insecurity   Worried About Charity fundraiser in the Last Year:  Never true   Arboriculturist in the Last Year: Never true  Transportation Needs: No Transportation Needs   Lack of Transportation (Medical): No   Lack of Transportation (Non-Medical): No  Physical Activity: Insufficiently Active   Days of Exercise per Week: 2 days   Minutes of Exercise per Session: 30 min  Stress: No Stress Concern Present   Feeling of Stress : Not at all  Social Connections: Socially Isolated   Frequency of Communication with Friends and Family: Once a week   Frequency of  Social Gatherings with Friends and Family: Never   Attends Religious Services: Never   Marine scientist or Organizations: Yes   Attends Music therapist: More than 4 times per year   Marital Status: Widowed   Vitals:   08/29/21 1154  BP: 124/76  Pulse: 81  Resp: 16  SpO2: 92%   Body mass index is 24.07 kg/m.  Physical Exam Vitals and nursing note reviewed.  Constitutional:      General: She is not in acute distress.    Appearance: She is well-developed.  HENT:     Head: Normocephalic and atraumatic.     Mouth/Throat:     Mouth: Mucous membranes are moist.  Eyes:     Conjunctiva/sclera: Conjunctivae normal.  Cardiovascular:     Rate and Rhythm: Normal rate and regular rhythm.     Heart sounds: No murmur heard. Pulmonary:     Effort: Pulmonary effort is normal. No respiratory distress.     Breath sounds: Normal breath sounds.  Abdominal:     Palpations: Abdomen is soft.     Tenderness: There is no abdominal tenderness.  Musculoskeletal:     Lumbar back: No tenderness or bony tenderness.     Right lower leg: No edema.     Left lower leg: No edema.  Skin:    General: Skin is warm.     Findings: No erythema or rash.  Neurological:     General: No focal deficit present.     Mental Status: She is alert and oriented to person, place, and time.     Comments: Unstable gait, not assisted.  Psychiatric:     Comments: Well groomed, good eye contact.   ASSESSMENT  AND PLAN:  [name] was seen today for fall.  Diagnoses and all orders for this visit: Orders Placed This Encounter  Procedures   DG Lumbar Spine Complete   Pneumococcal conjugate vaccine 20-valent (Prevnar 20)   Urinalysis with Reflex Microscopic   Ambulatory referral to [place] in home, initial encounter She does not remember how she fell. Today oriented x 3. Fall precautions discussed. She agrees with PT through [facility]. Even though she is not having urinary symptoms, recommend a UA, if abnormal urine will be sent for culture.  Acute bilateral low back pain without sciatica Examination does not suggest a serious process, no bony tenderness. New onset after fall, so lumbar X ray ordered. Recommend using IcyHot patch, continue Tylenol 500 mg 3-4 times per day, and heating pad. We do not have good options in regard to pain management, her daughter is requesting "something stronger" for pain, due to risk of side effects and CKD, I prefer to try Tylenol first. Further recommendation will be given according to imaging result.  INSOMNIA, CHRONIC Problem does not seem to be well controlled. No changes in current management, we discussed some side effects. Adequate sleep hygiene. Recommend following with PCP to address concern.  Closed compression fracture of body of L1 vertebra ([place]) Please refer to tel encounter and X ray result note. Nasal calcitonin was recommended. Tylenol seems to be helping with pain. Home PT for fall prevention pending.  Need for pneumococcal vaccination -     Pneumococcal conjugate vaccine 20-valent (Prevnar 20)  Return in about 2 weeks (around 09/12/2021) for f/u on chronic medical problems with PCP.  Pavielle Biggar G. Martinique, MD  [facility]. [place] office.

## 2021-08-29 ENCOUNTER — Ambulatory Visit (INDEPENDENT_AMBULATORY_CARE_PROVIDER_SITE_OTHER): Payer: Medicare Other

## 2021-08-29 ENCOUNTER — Other Ambulatory Visit (INDEPENDENT_AMBULATORY_CARE_PROVIDER_SITE_OTHER): Payer: Medicare Other

## 2021-08-29 ENCOUNTER — Encounter: Payer: Self-pay | Admitting: Family Medicine

## 2021-08-29 ENCOUNTER — Ambulatory Visit (INDEPENDENT_AMBULATORY_CARE_PROVIDER_SITE_OTHER): Payer: Medicare Other | Admitting: Family Medicine

## 2021-08-29 ENCOUNTER — Other Ambulatory Visit: Payer: Self-pay

## 2021-08-29 VITALS — BP 124/76 | HR 81 | Resp 16 | Ht 64.0 in | Wt 140.2 lb

## 2021-08-29 DIAGNOSIS — G4700 Insomnia, unspecified: Secondary | ICD-10-CM | POA: Diagnosis not present

## 2021-08-29 DIAGNOSIS — W19XXXA Unspecified fall, initial encounter: Secondary | ICD-10-CM

## 2021-08-29 DIAGNOSIS — M545 Low back pain, unspecified: Secondary | ICD-10-CM

## 2021-08-29 DIAGNOSIS — Z23 Encounter for immunization: Secondary | ICD-10-CM | POA: Diagnosis not present

## 2021-08-29 DIAGNOSIS — Y92009 Unspecified place in unspecified non-institutional (private) residence as the place of occurrence of the external cause: Secondary | ICD-10-CM

## 2021-08-29 DIAGNOSIS — S32010A Wedge compression fracture of first lumbar vertebra, initial encounter for closed fracture: Secondary | ICD-10-CM | POA: Diagnosis not present

## 2021-08-29 DIAGNOSIS — M47816 Spondylosis without myelopathy or radiculopathy, lumbar region: Secondary | ICD-10-CM | POA: Diagnosis not present

## 2021-08-29 DIAGNOSIS — M4316 Spondylolisthesis, lumbar region: Secondary | ICD-10-CM | POA: Diagnosis not present

## 2021-08-29 NOTE — Patient Instructions (Addendum)
A few things to remember from today's visit:  Fall in home, initial encounter - Plan: Ambulatory referral to [place], DG Lumbar Spine Complete, Urinalysis, Routine w reflex microscopic  Acute bilateral low back pain without sciatica - Plan: DG Lumbar Spine Complete  Do not use My Chart to request refills or for acute issues that need immediate attention.   Icy hot patch may help. PT at home will be arranged. If urine is abnormal we will send it for culture. Adequate hydration.  Please be sure medication list is accurate. If a new problem present, please set up appointment sooner than planned today.

## 2021-08-30 LAB — URINALYSIS, ROUTINE W REFLEX MICROSCOPIC
Bilirubin Urine: NEGATIVE
Hgb urine dipstick: NEGATIVE
Ketones, ur: NEGATIVE
Nitrite: NEGATIVE
Specific Gravity, Urine: 1.025 (ref 1.000–1.030)
Total Protein, Urine: NEGATIVE
Urine Glucose: NEGATIVE
Urobilinogen, UA: 0.2 (ref 0.0–1.0)
pH: 5.5 (ref 5.0–8.0)

## 2021-08-31 ENCOUNTER — Telehealth: Payer: Self-pay

## 2021-08-31 NOTE — Telephone Encounter (Signed)
Ray with [facility] imaging called to report on Lumbar spine x-ray. Ray stated noted L1 compression fracture since 2009, acute fracture is concerned given recent trauma additional CT or MRI can be performed to better access bone perpetuity. Full report is available via Epic.

## 2021-09-02 ENCOUNTER — Other Ambulatory Visit: Payer: Self-pay | Admitting: Family Medicine

## 2021-09-02 DIAGNOSIS — S32010A Wedge compression fracture of first lumbar vertebra, initial encounter for closed fracture: Secondary | ICD-10-CM

## 2021-09-02 MED ORDER — CALCITONIN (SALMON) 200 UNIT/ACT NA SOLN: 1.0000 | Freq: Every day | NASAL | 0 refills | Status: DC

## 2021-09-02 NOTE — Telephone Encounter (Signed)
I  see that Dr Martinique added Miacalcin and PT  Advise  fu visit   in 2-4 weeks. To fu on back injury  osteoporosis  and med check .

## 2021-09-14 DIAGNOSIS — G43909 Migraine, unspecified, not intractable, without status migrainosus: Secondary | ICD-10-CM | POA: Diagnosis not present

## 2021-09-14 DIAGNOSIS — N1832 Chronic kidney disease, stage 3b: Secondary | ICD-10-CM | POA: Diagnosis not present

## 2021-09-14 DIAGNOSIS — M47816 Spondylosis without myelopathy or radiculopathy, lumbar region: Secondary | ICD-10-CM | POA: Diagnosis not present

## 2021-09-14 DIAGNOSIS — Z86010 Personal history of colonic polyps: Secondary | ICD-10-CM | POA: Diagnosis not present

## 2021-09-14 DIAGNOSIS — K589 Irritable bowel syndrome without diarrhea: Secondary | ICD-10-CM | POA: Diagnosis not present

## 2021-09-14 DIAGNOSIS — S32019D Unspecified fracture of first lumbar vertebra, subsequent encounter for fracture with routine healing: Secondary | ICD-10-CM | POA: Diagnosis not present

## 2021-09-14 DIAGNOSIS — G2581 Restless legs syndrome: Secondary | ICD-10-CM | POA: Diagnosis not present

## 2021-09-14 DIAGNOSIS — Z9181 History of falling: Secondary | ICD-10-CM | POA: Diagnosis not present

## 2021-09-14 DIAGNOSIS — Z853 Personal history of malignant neoplasm of breast: Secondary | ICD-10-CM | POA: Diagnosis not present

## 2021-09-14 DIAGNOSIS — K5790 Diverticulosis of intestine, part unspecified, without perforation or abscess without bleeding: Secondary | ICD-10-CM | POA: Diagnosis not present

## 2021-09-14 DIAGNOSIS — Z87891 Personal history of nicotine dependence: Secondary | ICD-10-CM | POA: Diagnosis not present

## 2021-09-14 DIAGNOSIS — F4322 Adjustment disorder with anxiety: Secondary | ICD-10-CM | POA: Diagnosis not present

## 2021-09-14 DIAGNOSIS — F5104 Psychophysiologic insomnia: Secondary | ICD-10-CM | POA: Diagnosis not present

## 2021-09-14 DIAGNOSIS — M069 Rheumatoid arthritis, unspecified: Secondary | ICD-10-CM | POA: Diagnosis not present

## 2021-09-14 DIAGNOSIS — E785 Hyperlipidemia, unspecified: Secondary | ICD-10-CM | POA: Diagnosis not present

## 2021-09-14 DIAGNOSIS — K219 Gastro-esophageal reflux disease without esophagitis: Secondary | ICD-10-CM | POA: Diagnosis not present

## 2021-09-19 DIAGNOSIS — K589 Irritable bowel syndrome without diarrhea: Secondary | ICD-10-CM | POA: Diagnosis not present

## 2021-09-19 DIAGNOSIS — M069 Rheumatoid arthritis, unspecified: Secondary | ICD-10-CM | POA: Diagnosis not present

## 2021-09-19 DIAGNOSIS — E785 Hyperlipidemia, unspecified: Secondary | ICD-10-CM | POA: Diagnosis not present

## 2021-09-19 DIAGNOSIS — S32019D Unspecified fracture of first lumbar vertebra, subsequent encounter for fracture with routine healing: Secondary | ICD-10-CM | POA: Diagnosis not present

## 2021-09-19 DIAGNOSIS — F5104 Psychophysiologic insomnia: Secondary | ICD-10-CM | POA: Diagnosis not present

## 2021-09-19 DIAGNOSIS — M47816 Spondylosis without myelopathy or radiculopathy, lumbar region: Secondary | ICD-10-CM | POA: Diagnosis not present

## 2021-09-21 ENCOUNTER — Telehealth: Payer: Self-pay | Admitting: Internal Medicine

## 2021-09-21 DIAGNOSIS — E785 Hyperlipidemia, unspecified: Secondary | ICD-10-CM | POA: Diagnosis not present

## 2021-09-21 DIAGNOSIS — K589 Irritable bowel syndrome without diarrhea: Secondary | ICD-10-CM | POA: Diagnosis not present

## 2021-09-21 DIAGNOSIS — M47816 Spondylosis without myelopathy or radiculopathy, lumbar region: Secondary | ICD-10-CM | POA: Diagnosis not present

## 2021-09-21 DIAGNOSIS — F5104 Psychophysiologic insomnia: Secondary | ICD-10-CM | POA: Diagnosis not present

## 2021-09-21 DIAGNOSIS — M069 Rheumatoid arthritis, unspecified: Secondary | ICD-10-CM | POA: Diagnosis not present

## 2021-09-21 DIAGNOSIS — S32019D Unspecified fracture of first lumbar vertebra, subsequent encounter for fracture with routine healing: Secondary | ICD-10-CM | POA: Diagnosis not present

## 2021-09-21 NOTE — Telephone Encounter (Signed)
The nasal spray is not helping with the pain.

## 2021-09-21 NOTE — Telephone Encounter (Signed)
Pt is calling and seen dr Martinique and the nasal spray is not working and pt would like something else call in. Pt said md told her if  nasal spray does not work md would prescribed something else  [facility] [place], [address] Phone:  [phone]  Fax:  [phone]

## 2021-09-24 ENCOUNTER — Encounter: Payer: Self-pay | Admitting: Internal Medicine

## 2021-09-24 ENCOUNTER — Ambulatory Visit (INDEPENDENT_AMBULATORY_CARE_PROVIDER_SITE_OTHER): Payer: Medicare Other | Admitting: Internal Medicine

## 2021-09-24 VITALS — BP 130/76 | HR 78 | Temp 98.4°F | Ht 64.0 in | Wt 137.0 lb

## 2021-09-24 DIAGNOSIS — Z9181 History of falling: Secondary | ICD-10-CM | POA: Diagnosis not present

## 2021-09-24 DIAGNOSIS — M545 Low back pain, unspecified: Secondary | ICD-10-CM

## 2021-09-24 DIAGNOSIS — G4700 Insomnia, unspecified: Secondary | ICD-10-CM | POA: Diagnosis not present

## 2021-09-24 DIAGNOSIS — Z8781 Personal history of (healed) traumatic fracture: Secondary | ICD-10-CM | POA: Diagnosis not present

## 2021-09-24 DIAGNOSIS — S32010A Wedge compression fracture of first lumbar vertebra, initial encounter for closed fracture: Secondary | ICD-10-CM

## 2021-09-24 DIAGNOSIS — N289 Disorder of kidney and ureter, unspecified: Secondary | ICD-10-CM | POA: Diagnosis not present

## 2021-09-24 DIAGNOSIS — Z853 Personal history of malignant neoplasm of breast: Secondary | ICD-10-CM | POA: Diagnosis not present

## 2021-09-24 DIAGNOSIS — S32010S Wedge compression fracture of first lumbar vertebra, sequela: Secondary | ICD-10-CM

## 2021-09-24 DIAGNOSIS — Z79899 Other long term (current) drug therapy: Secondary | ICD-10-CM | POA: Diagnosis not present

## 2021-09-24 DIAGNOSIS — E559 Vitamin D deficiency, unspecified: Secondary | ICD-10-CM

## 2021-09-24 DIAGNOSIS — N1832 Chronic kidney disease, stage 3b: Secondary | ICD-10-CM | POA: Diagnosis not present

## 2021-09-24 MED ORDER — AMBIEN 10 MG PO TABS: ORAL_TABLET | ORAL | 0 refills | Status: DC

## 2021-09-24 MED ORDER — CALCITONIN (SALMON) 200 UNIT/ACT NA SOLN: 1.0000 | Freq: Every day | NASAL | 0 refills | Status: DC

## 2021-09-24 NOTE — Patient Instructions (Addendum)
Lab today   May plan imagining study to better look at back .  You will be conctacted about this  Refilled the miacalcin in the short run and  will assess  for   To bone building  therapy .  Fall prevention. Caution with ambien    1.2 dose  Try decreasing  mirtazapine  1/2 dose   to wean off. If not helping    Want to avoid risk medications for falls     Fall Prevention in the Home, Adult Falls can cause injuries and affect people of all ages. There are many simple things that you can do to make your home safe and to help prevent falls. Ask for help when making these changes, if needed. What actions can I take to prevent falls? General instructions Use good lighting in all rooms. Replace any light bulbs that burn out, turn on lights if it is dark, and use night-lights. Place frequently used items in easy-to-reach places. Lower the shelves around your home if necessary. Set up furniture so that there are clear paths around it. Avoid moving your furniture around. Remove throw rugs and other tripping hazards from the floor. Avoid walking on wet floors. Fix any uneven floor surfaces. Add color or contrast paint or tape to grab bars and handrails in your home. Place contrasting color strips on the first and last steps of staircases. When you use a stepladder, make sure that it is completely opened and that the sides and supports are firmly locked. Have someone hold the ladder while you are using it. Do not climb a closed stepladder. Know where your pets are when moving through your home. What can I do in the bathroom?   Keep the floor dry. Immediately clean up any water that is on the floor. Remove soap buildup in the tub or shower regularly. Use nonskid mats or decals on the floor of the tub or shower. Attach bath mats securely with double-sided, nonslip rug tape. If you need to sit down while you are in the shower, use a plastic, nonslip stool. Install grab bars by the toilet and in the  tub and shower. Do not use towel bars as grab bars. What can I do in the bedroom? Make sure that a bedside light is easy to reach. Do not use oversized bedding that reaches the floor. Have a firm chair that has side arms to use for getting dressed. What can I do in the kitchen? Clean up any spills right away. If you need to reach for something above you, use a sturdy step stool that has a grab bar. Keep electrical cables out of the way. Do not use floor polish or wax that makes floors slippery. If you must use wax, make sure that it is non-skid floor wax. What can I do with my stairs? Do not leave any items on the stairs. Make sure that you have a light switch at the top and the bottom of the stairs. Have them installed if you do not have them. Make sure that there are handrails on both sides of the stairs. Fix handrails that are broken or loose. Make sure that handrails are as long as the staircases. Install non-slip stair treads on all stairs in your home. Avoid having throw rugs at the top or bottom of stairs, or secure the rugs with carpet tape to prevent them from moving. Choose a carpet design that does not hide the edge of steps on the stairs. Check any  carpeting to make sure that it is firmly attached to the stairs. Fix any carpet that is loose or worn. What can I do on the outside of my home? Use bright outdoor lighting. Regularly repair the edges of walkways and driveways and fix any cracks. Remove high doorway thresholds. Trim any shrubbery on the main path into your home. Regularly check that handrails are securely fastened and in good repair. Both sides of all steps should have handrails. Install guardrails along the edges of any raised decks or porches. Clear walkways of debris and clutter, including tools and rocks. Have leaves, snow, and ice cleared regularly. Use sand or salt on walkways during winter months. In the garage, clean up any spills right away, including grease  or oil spills. What other actions can I take? Wear closed-toe shoes that fit well and support your feet. Wear shoes that have rubber soles or low heels. Use mobility aids as needed, such as canes, walkers, scooters, and crutches. Review your medicines with your health care provider. Some medicines can cause dizziness or changes in blood pressure, which increase your risk of falling. Talk with your health care provider about other ways that you can decrease your risk of falls. This may include working with a physical therapist or trainer to improve your strength, balance, and endurance. Where to find more information Centers for Disease Control and Prevention, [employer]: http://www.wolf.info/ National Institute on Aging: http://kim-miller.com/ Contact a health care provider if: You are afraid of falling at home. You feel weak, drowsy, or dizzy at home. You fall at home. Summary There are many simple things that you can do to make your home safe and to help prevent falls. Ways to make your home safe include removing tripping hazards and installing grab bars in the bathroom. Ask for help when making these changes in your home. This information is not intended to replace advice given to you by your health care provider. Make sure you discuss any questions you have with your health care provider. Document Revised: 04/05/2020 Document Reviewed: 04/05/2020 Elsevier Patient Education  [place].

## 2021-09-24 NOTE — Telephone Encounter (Signed)
Pt is seeing pcp today.

## 2021-09-24 NOTE — Progress Notes (Signed)
Chief Complaint  Patient presents with   Follow-up    HPI: Jeanne Tran 88 y.o. come in for fu  had fall last month and sustained a conmpression fx presumed from the fall She is here with her son today who drove. Her pain is now 4 out of 10 as opposed to7/10 on miacalcin  told she would get a different med   tylenol tid [place] local non radiating worse when up and around  Fall :not sure why was in socks no loc  didn't tell family unti llater  no loc and no head pain although laceration scalp  noted later by family.  Independent  sometimes forgetfully but no alarming sx .    Sleep: ambien 5 mg   asks fo refill not sure Remeron is helpful    Taking bentyl tid  eats 2.5 per day  no abd pain hasn't tried off med .   Got a prevnar 20  but  may have had  pneumovax in past  Had  shingrix not sure if both   Below note    Acute bilateral low back pain without sciatica Examination does not suggest a serious process, no bony tenderness. New onset after fall, so lumbar X ray ordered. Recommend using IcyHot patch, continue Tylenol 500 mg 3-4 times per day, and heating pad. We do not have good options in regard to pain management, her daughter is requesting "something stronger" for pain, due to risk of side effects and CKD, I prefer to try Tylenol first. Further recommendation will be given according to imaging result.   INSOMNIA, CHRONIC Problem does not seem to be well controlled. No changes in current management, we discussed some side effects. Adequate sleep hygiene. Recommend following with PCP to address concern.  Closed compression fracture of body of L1 vertebra ([place]) Please refer to tel encounter and X ray result note. Nasal calcitonin was recommended. Tylenol seems to be helping with pain.   Last dex ain April 22  -1.6 lowest  t score  Past Medical History:  Diagnosis Date   Breast cancer ([place])    hx of left with radiation and lumpectomy   Diverticulosis    GERD  (gastroesophageal reflux disease)    History of colonic polyps    History of ERCP    and sphincterotomy for abnormal lfts and dilated biliary ducts 5/05   Hyperlipidemia    Migraine headache    Pulmonary nodule    RLS (restless legs syndrome)    Wears contact lenses     Family History  Problem Relation Age of Onset   Mitral valve prolapse Son        with afib to have surgery    Social History   Socioeconomic History   Marital status: Widowed    Spouse name: Not on file   Number of children: 3   Years of education: Not on file   Highest education level: Not on file  Occupational History   Not on file  Tobacco Use   Smoking status: Former    Packs/day: 3.00    Years: 25.00    Pack years: 75.00    Types: Cigarettes    Quit date: 02/26/1994    Years since quitting: 27.5   Smokeless tobacco: Never   Tobacco comments:    quit 24 years   Vaping Use   Vaping Use: Never used  Substance and Sexual Activity   Alcohol use: No   Drug use: No  Sexual activity: Not on file  Other Topics Concern   Not on file  Social History Narrative   Retired; widowed   2 sons   Plays bridge twice monthly   Still drives         Social Determinants of Health   Financial Resource Strain: Low Risk    Difficulty of Paying Living Expenses: Not hard at all  Food Insecurity: No Food Insecurity   Worried About Charity fundraiser in the Last Year: Never true   Arboriculturist in the Last Year: Never true  Transportation Needs: No Transportation Needs   Lack of Transportation (Medical): No   Lack of Transportation (Non-Medical): No  Physical Activity: Insufficiently Active   Days of Exercise per Week: 2 days   Minutes of Exercise per Session: 30 min  Stress: No Stress Concern Present   Feeling of Stress : Not at all  Social Connections: Socially Isolated   Frequency of Communication with Friends and Family: Once a week   Frequency of Social Gatherings with Friends and Family: Never    Attends Religious Services: Never   Marine scientist or Organizations: Yes   Attends Music therapist: More than 4 times per year   Marital Status: Widowed    Outpatient Medications Prior to Visit  Medication Sig Dispense Refill   BIOTIN PO Take 2,000 mcg by mouth daily.     CALCIUM PO Take 1,000 mg by mouth.     cetirizine (ZYRTEC) 10 MG tablet Take 10 mg by mouth daily.     Cholecalciferol (VITAMIN D3) 2000 UNITS TABS Take 1 tablet by mouth daily.     dicyclomine (BENTYL) 20 MG tablet TAKE 1 TABLET BY MOUTH THREE TIMES DAILY BEFORE MEALS AND AT BEDTIME 270 tablet 1   famotidine (PEPCID) 20 MG tablet Take 1 tablet (20 mg total) by mouth 2 (two) times daily. 180 tablet 1   fish oil-omega-3 fatty acids 1000 MG capsule Take 1 g by mouth daily.     fluocinonide-emollient (LIDEX-E) 0.05 % cream Apply 1 application topically 2 (two) times daily. As  Directed. (Patient taking differently: Apply 1 application topically 2 (two) times daily. As  Directed.) 30 g 0   mirtazapine (REMERON) 15 MG tablet TAKE 1 TO 2 TABLETS BY MOUTH AT BEDTIME FOR SLEEP 180 tablet 0   Multiple Vitamin (MULTIVITAMIN) tablet Take 1 tablet by mouth daily.     Probiotic Product ([employer] PROBIOTIC) CHEW Chew 2 capsules by mouth daily.     rosuvastatin (CRESTOR) 5 MG tablet TAKE 1 TABLET(5 MG) BY MOUTH DAILY 90 tablet 0   sertraline (ZOLOFT) 100 MG tablet TAKE 1 TABLET(100 MG) BY MOUTH DAILY 90 tablet 1   AMBIEN 10 MG tablet TAKE 1/2 TABLET BY MOUTH AT BEDTIME. AVOID DAILY USE. MAY TAKE AN ADDITIONAL 1/2 TABLET IF NEEDED 90 tablet 0   calcitonin, salmon, (MIACALCIN) 200 UNIT/ACT nasal spray Place 1 spray into alternate nostrils daily. 3.7 mL 0   No facility-administered medications prior to visit.     EXAM:  BP 130/76 (BP Location: Right Arm, Patient Position: Sitting, Cuff Size: Normal)    Pulse 78    Temp 98.4 F (36.9 C) (Oral)    Ht 5\' 4"  (1.626 m)    Wt 137 lb (62.1 kg)    SpO2 95%    BMI 23.52 kg/m    Body mass index is 23.52 kg/m.  GENERAL: vitals reviewed and listed above, alert, oriented, appears well  hydrated and in no acute distress HEENT:  large scab area left frontal scalp with indentation  no bleeding or deformity ,  conjunctiva  clear, no obvious abnormalities on inspection of external nose and ears OP :masked  NECK: no obvious masses on inspection palpation  LUNGS: clear to auscultation bilaterally, no wheezes, rales or rhonchi, good air movement CV: HRRR, no clubbing cyanosis or  peripheral edema nl cap refill  MS: moves all extremities without noticeable focal  abnormality Back tender local high lunmbar  no deformity   Gait steady heel and toe strength nl  Skin no bruising  or petechia PSYCH: pleasant and cooperative, no obvious depression or anxiety Lab Results  Component Value Date   WBC 6.8 06/13/2021   HGB 13.0 06/13/2021   HCT 39.0 06/13/2021   PLT 205.0 06/13/2021   GLUCOSE 98 06/13/2021   CHOL 182 06/13/2021   TRIG 116.0 06/13/2021   HDL 72.20 06/13/2021   LDLCALC 86 06/13/2021   ALT 24 06/13/2021   AST 28 06/13/2021   NA 138 06/13/2021   K 4.4 06/13/2021   CL 100 06/13/2021   CREATININE 1.59 (H) 06/13/2021   BUN 25 (H) 06/13/2021   CO2 31 06/13/2021   TSH 3.73 12/27/2015   INR 1.00 06/29/2014   HGBA1C 6.3 01/22/2019   MICROALBUR <0.7 06/13/2021   BP Readings from Last 3 Encounters:  09/24/21 130/76  08/29/21 124/76  06/13/21 130/70    ASSESSMENT AND PLAN:  Discussed the following assessment and plan:  Closed compression fracture of body of L1 vertebra ([facility]) - Plan: Basic metabolic panel, VITAMIN D 25 Hydroxy (Vit-D Deficiency, Fractures), PTH, Intact and Calcium, PTH, Intact and Calcium, VITAMIN D 25 Hydroxy (Vit-D Deficiency, Fractures), Basic metabolic panel, MR Lumbar Spine Wo Contrast  History of bad fall - preseumed cause of compression fx and healing scalp laceeration - Plan: Basic metabolic panel, VITAMIN D 25 Hydroxy (Vit-D  Deficiency, Fractures), PTH, Intact and Calcium, PTH, Intact and Calcium, VITAMIN D 25 Hydroxy (Vit-D Deficiency, Fractures), Basic metabolic panel  Compression fracture of L1 vertebra, sequela - Plan: calcitonin, salmon, (MIACALCIN) 200 UNIT/ACT nasal spray  INSOMNIA, CHRONIC - Plan: Basic metabolic panel, VITAMIN D 25 Hydroxy (Vit-D Deficiency, Fractures), PTH, Intact and Calcium, PTH, Intact and Calcium, VITAMIN D 25 Hydroxy (Vit-D Deficiency, Fractures), Basic metabolic panel  Medication management - Plan: Basic metabolic panel, VITAMIN D 25 Hydroxy (Vit-D Deficiency, Fractures), PTH, Intact and Calcium, PTH, Intact and Calcium, VITAMIN D 25 Hydroxy (Vit-D Deficiency, Fractures), Basic metabolic panel  Vitamin D deficiency - Plan: Basic metabolic panel, VITAMIN D 25 Hydroxy (Vit-D Deficiency, Fractures), PTH, Intact and Calcium, PTH, Intact and Calcium, VITAMIN D 25 Hydroxy (Vit-D Deficiency, Fractures), Basic metabolic panel  History of left breast cancer - Plan: MR Lumbar Spine Wo Contrast  High risk medication use  Stage 3b chronic kidney disease ([place]) Fall does not appear to be cardiovascular cause and appears to have good steady gait today no tremor on nonfocal exam Evidence of healed laceration scalp trauma and symptoms are localized to the L1 area. Based on x-ray reading and her history of breast cancer will order MRI of back and if reassuring continue plan.  Same meds for now :: transitioning to bone building therapy such as alendronate however because of decreased renal function we will have to assess. Sleep has been problematic off and on for a number of years and has been on a number of medicines have been tried varied success. High risk sleep medicines :caution  with medicines can try weaning the mirtazapine since it does not appear to be adding much to the Ambien. Work on trial weaning off  dicyclomine  -Patient advised to return or notify health care team  if  new concerns  in  interim   Patient Instructions  Lab today   May plan imagining study to better look at back .  You will be conctacted about this  Refilled the miacalcin in the short run and  will assess  for   To bone building  therapy .  Fall prevention. Caution with ambien    1.2 dose  Try decreasing  mirtazapine  1/2 dose   to wean off. If not helping    Want to avoid risk medications for falls     Fall Prevention in the Home, Adult Falls can cause injuries and affect people of all ages. There are many simple things that you can do to make your home safe and to help prevent falls. Ask for help when making these changes, if needed. What actions can I take to prevent falls? General instructions Use good lighting in all rooms. Replace any light bulbs that burn out, turn on lights if it is dark, and use night-lights. Place frequently used items in easy-to-reach places. Lower the shelves around your home if necessary. Set up furniture so that there are clear paths around it. Avoid moving your furniture around. Remove throw rugs and other tripping hazards from the floor. Avoid walking on wet floors. Fix any uneven floor surfaces. Add color or contrast paint or tape to grab bars and handrails in your home. Place contrasting color strips on the first and last steps of staircases. When you use a stepladder, make sure that it is completely opened and that the sides and supports are firmly locked. Have someone hold the ladder while you are using it. Do not climb a closed stepladder. Know where your pets are when moving through your home. What can I do in the bathroom?   Keep the floor dry. Immediately clean up any water that is on the floor. Remove soap buildup in the tub or shower regularly. Use nonskid mats or decals on the floor of the tub or shower. Attach bath mats securely with double-sided, nonslip rug tape. If you need to sit down while you are in the shower, use a plastic, nonslip stool. Install  grab bars by the toilet and in the tub and shower. Do not use towel bars as grab bars. What can I do in the bedroom? Make sure that a bedside light is easy to reach. Do not use oversized bedding that reaches the floor. Have a firm chair that has side arms to use for getting dressed. What can I do in the kitchen? Clean up any spills right away. If you need to reach for something above you, use a sturdy step stool that has a grab bar. Keep electrical cables out of the way. Do not use floor polish or wax that makes floors slippery. If you must use wax, make sure that it is non-skid floor wax. What can I do with my stairs? Do not leave any items on the stairs. Make sure that you have a light switch at the top and the bottom of the stairs. Have them installed if you do not have them. Make sure that there are handrails on both sides of the stairs. Fix handrails that are broken or loose. Make sure that handrails are as long as the staircases.  Install non-slip stair treads on all stairs in your home. Avoid having throw rugs at the top or bottom of stairs, or secure the rugs with carpet tape to prevent them from moving. Choose a carpet design that does not hide the edge of steps on the stairs. Check any carpeting to make sure that it is firmly attached to the stairs. Fix any carpet that is loose or worn. What can I do on the outside of my home? Use bright outdoor lighting. Regularly repair the edges of walkways and driveways and fix any cracks. Remove high doorway thresholds. Trim any shrubbery on the main path into your home. Regularly check that handrails are securely fastened and in good repair. Both sides of all steps should have handrails. Install guardrails along the edges of any raised decks or porches. Clear walkways of debris and clutter, including tools and rocks. Have leaves, snow, and ice cleared regularly. Use sand or salt on walkways during winter months. In the garage, clean up any  spills right away, including grease or oil spills. What other actions can I take? Wear closed-toe shoes that fit well and support your feet. Wear shoes that have rubber soles or low heels. Use mobility aids as needed, such as canes, walkers, scooters, and crutches. Review your medicines with your health care provider. Some medicines can cause dizziness or changes in blood pressure, which increase your risk of falling. Talk with your health care provider about other ways that you can decrease your risk of falls. This may include working with a physical therapist or trainer to improve your strength, balance, and endurance. Where to find more information Centers for Disease Control and Prevention, [employer]: http://www.wolf.info/ National Institute on Aging: http://kim-miller.com/ Contact a health care provider if: You are afraid of falling at home. You feel weak, drowsy, or dizzy at home. You fall at home. Summary There are many simple things that you can do to make your home safe and to help prevent falls. Ways to make your home safe include removing tripping hazards and installing grab bars in the bathroom. Ask for help when making these changes in your home. This information is not intended to replace advice given to you by your health care provider. Make sure you discuss any questions you have with your health care provider. Document Revised: 04/05/2020 Document Reviewed: 04/05/2020 Elsevier Patient Education  2022 [place] Annmargaret Decaprio M.D.

## 2021-09-25 LAB — BASIC METABOLIC PANEL
BUN: 34 mg/dL — ABNORMAL HIGH (ref 6–23)
CO2: 29 mEq/L (ref 19–32)
Calcium: 10.2 mg/dL (ref 8.4–10.5)
Chloride: 104 mEq/L (ref 96–112)
Creatinine, Ser: 1.73 mg/dL — ABNORMAL HIGH (ref 0.40–1.20)
GFR: 26.52 mL/min — ABNORMAL LOW (ref >60.00)
Glucose, Bld: 98 mg/dL (ref 70–99)
Potassium: 5.1 mEq/L (ref 3.5–5.1)
Sodium: 141 mEq/L (ref 135–145)

## 2021-09-25 LAB — VITAMIN D 25 HYDROXY (VIT D DEFICIENCY, FRACTURES): VITD: 96.96 ng/mL (ref 30.00–100.00)

## 2021-09-26 DIAGNOSIS — S32019D Unspecified fracture of first lumbar vertebra, subsequent encounter for fracture with routine healing: Secondary | ICD-10-CM | POA: Diagnosis not present

## 2021-09-26 DIAGNOSIS — F5104 Psychophysiologic insomnia: Secondary | ICD-10-CM | POA: Diagnosis not present

## 2021-09-26 DIAGNOSIS — E785 Hyperlipidemia, unspecified: Secondary | ICD-10-CM | POA: Diagnosis not present

## 2021-09-26 DIAGNOSIS — K589 Irritable bowel syndrome without diarrhea: Secondary | ICD-10-CM | POA: Diagnosis not present

## 2021-09-26 DIAGNOSIS — M069 Rheumatoid arthritis, unspecified: Secondary | ICD-10-CM | POA: Diagnosis not present

## 2021-09-26 DIAGNOSIS — M47816 Spondylosis without myelopathy or radiculopathy, lumbar region: Secondary | ICD-10-CM | POA: Diagnosis not present

## 2021-09-26 LAB — PTH, INTACT AND CALCIUM
Calcium: 10.4 mg/dL (ref 8.6–10.4)
PTH: 35 pg/mL (ref 16–77)

## 2021-09-28 DIAGNOSIS — E785 Hyperlipidemia, unspecified: Secondary | ICD-10-CM | POA: Diagnosis not present

## 2021-09-28 DIAGNOSIS — K589 Irritable bowel syndrome without diarrhea: Secondary | ICD-10-CM | POA: Diagnosis not present

## 2021-09-28 DIAGNOSIS — M069 Rheumatoid arthritis, unspecified: Secondary | ICD-10-CM | POA: Diagnosis not present

## 2021-09-28 DIAGNOSIS — M47816 Spondylosis without myelopathy or radiculopathy, lumbar region: Secondary | ICD-10-CM | POA: Diagnosis not present

## 2021-09-28 DIAGNOSIS — F5104 Psychophysiologic insomnia: Secondary | ICD-10-CM | POA: Diagnosis not present

## 2021-09-28 DIAGNOSIS — S32019D Unspecified fracture of first lumbar vertebra, subsequent encounter for fracture with routine healing: Secondary | ICD-10-CM | POA: Diagnosis not present

## 2021-09-28 NOTE — Progress Notes (Signed)
Kidney function  is slightly worse .  Vit D is in good range . Waiting for the spine MRI results .    Can stop the miacalcin when pain is better .  We may consider consult with endocrine  about best treatment . For osteoporosis. In future . Make a follow up after MRI results  are back .

## 2021-10-01 ENCOUNTER — Telehealth: Payer: Self-pay | Admitting: Internal Medicine

## 2021-10-01 DIAGNOSIS — M069 Rheumatoid arthritis, unspecified: Secondary | ICD-10-CM | POA: Diagnosis not present

## 2021-10-01 DIAGNOSIS — K589 Irritable bowel syndrome without diarrhea: Secondary | ICD-10-CM | POA: Diagnosis not present

## 2021-10-01 DIAGNOSIS — S32019D Unspecified fracture of first lumbar vertebra, subsequent encounter for fracture with routine healing: Secondary | ICD-10-CM | POA: Diagnosis not present

## 2021-10-01 DIAGNOSIS — M47816 Spondylosis without myelopathy or radiculopathy, lumbar region: Secondary | ICD-10-CM | POA: Diagnosis not present

## 2021-10-01 DIAGNOSIS — E785 Hyperlipidemia, unspecified: Secondary | ICD-10-CM | POA: Diagnosis not present

## 2021-10-01 DIAGNOSIS — F5104 Psychophysiologic insomnia: Secondary | ICD-10-CM | POA: Diagnosis not present

## 2021-10-01 NOTE — Telephone Encounter (Signed)
OK 

## 2021-10-01 NOTE — Telephone Encounter (Signed)
[facility] call and stated pt don't need occupation therapy any more but still need physical therapy.[facility] # is [phone].

## 2021-10-01 NOTE — Telephone Encounter (Signed)
Please advise 

## 2021-10-02 DIAGNOSIS — F5104 Psychophysiologic insomnia: Secondary | ICD-10-CM | POA: Diagnosis not present

## 2021-10-02 DIAGNOSIS — E785 Hyperlipidemia, unspecified: Secondary | ICD-10-CM | POA: Diagnosis not present

## 2021-10-02 DIAGNOSIS — K589 Irritable bowel syndrome without diarrhea: Secondary | ICD-10-CM | POA: Diagnosis not present

## 2021-10-02 DIAGNOSIS — M069 Rheumatoid arthritis, unspecified: Secondary | ICD-10-CM | POA: Diagnosis not present

## 2021-10-02 DIAGNOSIS — M47816 Spondylosis without myelopathy or radiculopathy, lumbar region: Secondary | ICD-10-CM | POA: Diagnosis not present

## 2021-10-02 DIAGNOSIS — S32019D Unspecified fracture of first lumbar vertebra, subsequent encounter for fracture with routine healing: Secondary | ICD-10-CM | POA: Diagnosis not present

## 2021-10-04 NOTE — Telephone Encounter (Signed)
[facility] informed

## 2021-10-10 DIAGNOSIS — F5104 Psychophysiologic insomnia: Secondary | ICD-10-CM | POA: Diagnosis not present

## 2021-10-10 DIAGNOSIS — K589 Irritable bowel syndrome without diarrhea: Secondary | ICD-10-CM | POA: Diagnosis not present

## 2021-10-10 DIAGNOSIS — M069 Rheumatoid arthritis, unspecified: Secondary | ICD-10-CM | POA: Diagnosis not present

## 2021-10-10 DIAGNOSIS — S32019D Unspecified fracture of first lumbar vertebra, subsequent encounter for fracture with routine healing: Secondary | ICD-10-CM | POA: Diagnosis not present

## 2021-10-10 DIAGNOSIS — M47816 Spondylosis without myelopathy or radiculopathy, lumbar region: Secondary | ICD-10-CM | POA: Diagnosis not present

## 2021-10-10 DIAGNOSIS — E785 Hyperlipidemia, unspecified: Secondary | ICD-10-CM | POA: Diagnosis not present

## 2021-10-12 ENCOUNTER — Other Ambulatory Visit: Payer: Self-pay | Admitting: Hematology

## 2021-10-12 DIAGNOSIS — F4322 Adjustment disorder with anxiety: Secondary | ICD-10-CM

## 2021-10-14 DIAGNOSIS — E785 Hyperlipidemia, unspecified: Secondary | ICD-10-CM | POA: Diagnosis not present

## 2021-10-14 DIAGNOSIS — G2581 Restless legs syndrome: Secondary | ICD-10-CM | POA: Diagnosis not present

## 2021-10-14 DIAGNOSIS — K589 Irritable bowel syndrome without diarrhea: Secondary | ICD-10-CM | POA: Diagnosis not present

## 2021-10-14 DIAGNOSIS — Z86010 Personal history of colonic polyps: Secondary | ICD-10-CM | POA: Diagnosis not present

## 2021-10-14 DIAGNOSIS — N1832 Chronic kidney disease, stage 3b: Secondary | ICD-10-CM | POA: Diagnosis not present

## 2021-10-14 DIAGNOSIS — Z9181 History of falling: Secondary | ICD-10-CM | POA: Diagnosis not present

## 2021-10-14 DIAGNOSIS — K219 Gastro-esophageal reflux disease without esophagitis: Secondary | ICD-10-CM | POA: Diagnosis not present

## 2021-10-14 DIAGNOSIS — S32019D Unspecified fracture of first lumbar vertebra, subsequent encounter for fracture with routine healing: Secondary | ICD-10-CM | POA: Diagnosis not present

## 2021-10-14 DIAGNOSIS — M47816 Spondylosis without myelopathy or radiculopathy, lumbar region: Secondary | ICD-10-CM | POA: Diagnosis not present

## 2021-10-14 DIAGNOSIS — Z87891 Personal history of nicotine dependence: Secondary | ICD-10-CM | POA: Diagnosis not present

## 2021-10-14 DIAGNOSIS — K5790 Diverticulosis of intestine, part unspecified, without perforation or abscess without bleeding: Secondary | ICD-10-CM | POA: Diagnosis not present

## 2021-10-14 DIAGNOSIS — M069 Rheumatoid arthritis, unspecified: Secondary | ICD-10-CM | POA: Diagnosis not present

## 2021-10-14 DIAGNOSIS — F4322 Adjustment disorder with anxiety: Secondary | ICD-10-CM | POA: Diagnosis not present

## 2021-10-14 DIAGNOSIS — F5104 Psychophysiologic insomnia: Secondary | ICD-10-CM | POA: Diagnosis not present

## 2021-10-14 DIAGNOSIS — Z853 Personal history of malignant neoplasm of breast: Secondary | ICD-10-CM | POA: Diagnosis not present

## 2021-10-14 DIAGNOSIS — G43909 Migraine, unspecified, not intractable, without status migrainosus: Secondary | ICD-10-CM | POA: Diagnosis not present

## 2021-10-15 DIAGNOSIS — M47816 Spondylosis without myelopathy or radiculopathy, lumbar region: Secondary | ICD-10-CM | POA: Diagnosis not present

## 2021-10-15 DIAGNOSIS — K589 Irritable bowel syndrome without diarrhea: Secondary | ICD-10-CM | POA: Diagnosis not present

## 2021-10-15 DIAGNOSIS — F5104 Psychophysiologic insomnia: Secondary | ICD-10-CM | POA: Diagnosis not present

## 2021-10-15 DIAGNOSIS — E785 Hyperlipidemia, unspecified: Secondary | ICD-10-CM | POA: Diagnosis not present

## 2021-10-15 DIAGNOSIS — S32019D Unspecified fracture of first lumbar vertebra, subsequent encounter for fracture with routine healing: Secondary | ICD-10-CM | POA: Diagnosis not present

## 2021-10-15 DIAGNOSIS — M069 Rheumatoid arthritis, unspecified: Secondary | ICD-10-CM | POA: Diagnosis not present

## 2021-10-22 ENCOUNTER — Other Ambulatory Visit: Payer: Self-pay | Admitting: Internal Medicine

## 2021-10-22 DIAGNOSIS — Z853 Personal history of malignant neoplasm of breast: Secondary | ICD-10-CM

## 2021-10-22 DIAGNOSIS — S32019D Unspecified fracture of first lumbar vertebra, subsequent encounter for fracture with routine healing: Secondary | ICD-10-CM | POA: Diagnosis not present

## 2021-10-22 DIAGNOSIS — F5104 Psychophysiologic insomnia: Secondary | ICD-10-CM | POA: Diagnosis not present

## 2021-10-22 DIAGNOSIS — E785 Hyperlipidemia, unspecified: Secondary | ICD-10-CM | POA: Diagnosis not present

## 2021-10-22 DIAGNOSIS — G2581 Restless legs syndrome: Secondary | ICD-10-CM | POA: Diagnosis not present

## 2021-10-22 DIAGNOSIS — Z86010 Personal history of colonic polyps: Secondary | ICD-10-CM

## 2021-10-22 DIAGNOSIS — N1832 Chronic kidney disease, stage 3b: Secondary | ICD-10-CM | POA: Diagnosis not present

## 2021-10-22 DIAGNOSIS — G43909 Migraine, unspecified, not intractable, without status migrainosus: Secondary | ICD-10-CM | POA: Diagnosis not present

## 2021-10-22 DIAGNOSIS — S32010S Wedge compression fracture of first lumbar vertebra, sequela: Secondary | ICD-10-CM

## 2021-10-22 DIAGNOSIS — Z87891 Personal history of nicotine dependence: Secondary | ICD-10-CM

## 2021-10-22 DIAGNOSIS — Z9181 History of falling: Secondary | ICD-10-CM

## 2021-10-22 DIAGNOSIS — K5790 Diverticulosis of intestine, part unspecified, without perforation or abscess without bleeding: Secondary | ICD-10-CM | POA: Diagnosis not present

## 2021-10-22 DIAGNOSIS — M069 Rheumatoid arthritis, unspecified: Secondary | ICD-10-CM | POA: Diagnosis not present

## 2021-10-22 DIAGNOSIS — K219 Gastro-esophageal reflux disease without esophagitis: Secondary | ICD-10-CM | POA: Diagnosis not present

## 2021-10-22 DIAGNOSIS — K589 Irritable bowel syndrome without diarrhea: Secondary | ICD-10-CM | POA: Diagnosis not present

## 2021-10-22 DIAGNOSIS — S32019A Unspecified fracture of first lumbar vertebra, initial encounter for closed fracture: Secondary | ICD-10-CM | POA: Diagnosis not present

## 2021-10-22 DIAGNOSIS — M47816 Spondylosis without myelopathy or radiculopathy, lumbar region: Secondary | ICD-10-CM | POA: Diagnosis not present

## 2021-10-22 DIAGNOSIS — F4322 Adjustment disorder with anxiety: Secondary | ICD-10-CM | POA: Diagnosis not present

## 2021-10-29 ENCOUNTER — Encounter: Payer: Medicare Other | Admitting: Internal Medicine

## 2021-10-29 ENCOUNTER — Encounter: Payer: Self-pay | Admitting: Internal Medicine

## 2021-10-29 DIAGNOSIS — Z853 Personal history of malignant neoplasm of breast: Secondary | ICD-10-CM

## 2021-10-29 DIAGNOSIS — N184 Chronic kidney disease, stage 4 (severe): Secondary | ICD-10-CM

## 2021-10-29 DIAGNOSIS — S32010A Wedge compression fracture of first lumbar vertebra, initial encounter for closed fracture: Secondary | ICD-10-CM

## 2021-10-29 NOTE — Progress Notes (Deleted)
I spoke with [facility] and they stated they have not received referral because location was not specified to them. New referral has been placed with correct imaging site.

## 2021-10-29 NOTE — Progress Notes (Signed)
Ovisit opened unable to contact patient  MRI spine reordered to GI by MG

## 2021-10-31 ENCOUNTER — Telehealth (INDEPENDENT_AMBULATORY_CARE_PROVIDER_SITE_OTHER): Payer: Medicare Other | Admitting: Internal Medicine

## 2021-10-31 ENCOUNTER — Encounter: Payer: Self-pay | Admitting: Internal Medicine

## 2021-10-31 DIAGNOSIS — S32010A Wedge compression fracture of first lumbar vertebra, initial encounter for closed fracture: Secondary | ICD-10-CM

## 2021-10-31 DIAGNOSIS — M8000XS Age-related osteoporosis with current pathological fracture, unspecified site, sequela: Secondary | ICD-10-CM | POA: Diagnosis not present

## 2021-10-31 DIAGNOSIS — Z79899 Other long term (current) drug therapy: Secondary | ICD-10-CM | POA: Diagnosis not present

## 2021-10-31 DIAGNOSIS — N1832 Chronic kidney disease, stage 3b: Secondary | ICD-10-CM

## 2021-10-31 DIAGNOSIS — Z853 Personal history of malignant neoplasm of breast: Secondary | ICD-10-CM

## 2021-10-31 DIAGNOSIS — Z9181 History of falling: Secondary | ICD-10-CM

## 2021-10-31 NOTE — Progress Notes (Signed)
° °  Virtual Visit via Telephone Note  I connected with@ on 10/31/21 at 12:30 PM EST by telephone and verified that I am speaking with the correct person using two identifiers.   I discussed the limitations, risks, security and privacy concerns of performing an evaluation and management service by telephone and the limited availability of in person appointments. tThere may be a patient responsible charge related to this service. The patient expressed understanding and agreed to proceed.  Location patient: home Location provider: work office Participants present for the call: patient, provider Patient did not have a visit in the prior 7 days to address this/these issue(s).   History of Present Illness: Jeanne Tran    Observations/Objective: Patient sounds personable and well on the phone. I do not appreciate any SOB. Speech and thought processing are grossly intact. Patient reported vitals: Lab Results  Component Value Date   WBC 6.8 06/13/2021   HGB 13.0 06/13/2021   HCT 39.0 06/13/2021   PLT 205.0 06/13/2021   GLUCOSE 98 09/24/2021   CHOL 182 06/13/2021   TRIG 116.0 06/13/2021   HDL 72.20 06/13/2021   LDLCALC 86 06/13/2021   ALT 24 06/13/2021   AST 28 06/13/2021   NA 141 09/24/2021   K 5.1 09/24/2021   CL 104 09/24/2021   CREATININE 1.73 (H) 09/24/2021   BUN 34 (H) 09/24/2021   CO2 29 09/24/2021   TSH 3.73 12/27/2015   INR 1.00 06/29/2014   HGBA1C 6.3 01/22/2019   MICROALBUR <0.7 06/13/2021   Last dexa 2022 -1,6 Assessment and Plan:  Medication evaluation high risk medicines memory not optimal but is able to write things down and get appointments correct. We spent a good deal of time going over the future plans she does use a pillbox. Her GFR calculated is low and may not be a candidate for the bisphosphonate. May have take fosamax in the past  Consider referral to osteoporosis specialist about the next best intervention treatment for her osteoporosis  For  now stay on the Miacalcin Awaiting MRI of spine. Try off of the mirtazapine and just continue on the half of the Ambien. Decrease the dicyclomine to 2 times a day with meals.  Follow Up Instructions: For now stay on the Miacalcin Awaiting MRI of spine. Try off of the mirtazapine and just continue on the half of the Ambien. Decrease the dicyclomine to 2 times a day with meals. Plan follow-up telephone visit in a month or as indicated hopefully will have the scan back by that time.    [id number] 5-10 [id number] 11-20 [id number] 21-30 I did not refer this patient for an OV in the next 24 hours for this/these issue(s).  I discussed the assessment and treatment plan with the patient. The patient was provided an opportunity to ask questions and answered. The patient agreed with the plan and demonstrated an understanding of the instructions.   The patient was advised to call back or seek an in-person evaluation if the symptoms worsen or if the condition fails to improve as anticipated.  I provided 22 minutes  minutes of non-face-to-face time during this encounter. Return in about 1 month (around 11/28/2021) for televisit ok  med check and after mri of spine.  Shanon Ace, MD

## 2021-11-12 ENCOUNTER — Telehealth: Payer: Self-pay | Admitting: Internal Medicine

## 2021-11-12 DIAGNOSIS — S32010S Wedge compression fracture of first lumbar vertebra, sequela: Secondary | ICD-10-CM

## 2021-11-12 MED ORDER — CALCITONIN (SALMON) 200 UNIT/ACT NA SOLN: NASAL | 2 refills | Status: DC

## 2021-11-12 NOTE — Telephone Encounter (Signed)
RX sent

## 2021-11-12 NOTE — Telephone Encounter (Signed)
Patient called in requesting a refill for calcitonin, salmon, (MIACALCIN/FORTICAL) 200 UNIT/ACT nasal spray [[id number]] to be sent to her pharmacy.  Please advise.

## 2021-11-15 ENCOUNTER — Other Ambulatory Visit: Payer: Self-pay | Admitting: Internal Medicine

## 2021-11-21 ENCOUNTER — Telehealth (INDEPENDENT_AMBULATORY_CARE_PROVIDER_SITE_OTHER): Payer: Medicare Other | Admitting: Internal Medicine

## 2021-11-21 ENCOUNTER — Encounter: Payer: Self-pay | Admitting: Internal Medicine

## 2021-11-21 DIAGNOSIS — G4700 Insomnia, unspecified: Secondary | ICD-10-CM

## 2021-11-21 DIAGNOSIS — Z853 Personal history of malignant neoplasm of breast: Secondary | ICD-10-CM | POA: Diagnosis not present

## 2021-11-21 DIAGNOSIS — S32010S Wedge compression fracture of first lumbar vertebra, sequela: Secondary | ICD-10-CM

## 2021-11-21 DIAGNOSIS — M8000XS Age-related osteoporosis with current pathological fracture, unspecified site, sequela: Secondary | ICD-10-CM

## 2021-11-21 DIAGNOSIS — Z79899 Other long term (current) drug therapy: Secondary | ICD-10-CM | POA: Diagnosis not present

## 2021-11-21 NOTE — Progress Notes (Signed)
? ?Virtual Visit via Telephone Note ? ?I connected with@ on 11/21/21 at 12:00 PM EST by telephone and verified that I am speaking with the correct person using two identifiers. ?  ?I discussed the limitations, risks, security and privacy concerns of performing an evaluation and management service by telephone and the limited availability of in person appointments. tThere may be a patient responsible charge related to this service. The patient expressed understanding and agreed to proceed. ? ?Location patient: home ?Location provider: work office ?Participants present for the call: patient, provider ?Patient did not have a visit in the prior 7 days to address this/these issue(s). ? ? ?History of Present Illness: ?DICY Spirito follow-up medicines. ?Insomnia she has not been sleeping well for a while is taking Ambien 5 mg wakens up every 2 hours take another 5 mg in the middle the night. ?Recently we [place] the Remeron at the advice of the pharmacy team.  She does not think it is worse but it certainly is not better.  She is not on melatonin ?She does go to sleep with the radio on music and it stays on all night when she turns it off.  Feels like her sleeping area is fine. ?She continues on sertraline and feels that her mood is pretty good memory not a lot different. ? ?Back is about the same radiology contacted her but she was not there.  So MRI of her spine has not yet been done. ? ? ?  ?Observations/Objective: ?Patient sounds personable and well on the phone. ?I do not appreciate any SOB. ?Speech and thought processing are grossly intact. ?Patient reported vitals: ?Lab Results  ?Component Value Date  ? WBC 6.8 06/13/2021  ? HGB 13.0 06/13/2021  ? HCT 39.0 06/13/2021  ? PLT 205.0 06/13/2021  ? GLUCOSE 98 09/24/2021  ? CHOL 182 06/13/2021  ? TRIG 116.0 06/13/2021  ? HDL 72.20 06/13/2021  ? [place] 86 06/13/2021  ? ALT 24 06/13/2021  ? AST 28 06/13/2021  ? NA 141 09/24/2021  ? K 5.1 09/24/2021  ? CL 104 09/24/2021   ? CREATININE 1.73 (H) 09/24/2021  ? BUN 34 (H) 09/24/2021  ? CO2 29 09/24/2021  ? TSH 3.73 12/27/2015  ? INR 1.00 06/29/2014  ? HGBA1C 6.3 01/22/2019  ? MICROALBUR <0.7 06/13/2021  ? ? ?Assessment and Plan: ?INSOMNIA, CHRONIC ? ?Medication management ? ?Compression fracture of L1 vertebra, sequela ? ?Osteoporosis with current pathological fracture, unspecified osteoporosis type, sequela ? ?History of left breast cancer ? ? ?Follow Up Instructions: ?Uncertain what other meds might be helpful she has had some chronic insomnia for quite a while her depression anxiety is controlled on sertraline. ?Question whether trazodone added would be helpful consider other medications. ?She is on the Ambien 5 mg twice at night and still waking up.  Not sure we are going to be successful with medications but will reach out to pharmacy team for their advice also. ? ?Awaiting MRI of spine and then plan follow-up. ? ? ?[id number] 5-10 ?[id number] 11-20 ?[id number] 21-30 ?I did not refer this patient for an OV in the next 24 hours for this/these issue(s). ? ?I discussed the assessment and treatment plan with the patient. The patient was provided an opportunity to ask questions and answered. The patient agreed with the plan and demonstrated an understanding of the instructions. ?  ?The patient was advised to call back or seek an in-person evaluation if the symptoms worsen or if the condition fails to improve as anticipated. ? ?  I provided 13 minutes of non-face-to-face time during this encounter. ?No follow-ups on file. ? ?Shanon Ace, MD  ?

## 2021-11-27 NOTE — Progress Notes (Signed)
Working on getting an update on referral ?

## 2021-11-27 NOTE — Progress Notes (Signed)
Yes I would be happy to help! Can you please put in a referral for her to see me? The code is [id number]. Thanks! ?

## 2021-11-28 ENCOUNTER — Telehealth: Payer: Self-pay | Admitting: Internal Medicine

## 2021-11-28 ENCOUNTER — Other Ambulatory Visit: Payer: Self-pay

## 2021-11-28 DIAGNOSIS — Z79899 Other long term (current) drug therapy: Secondary | ICD-10-CM

## 2021-11-28 NOTE — Chronic Care Management (AMB) (Signed)
?  Chronic Care Management  ? ?Outreach Note ? ?11/28/2021 ?Name: TAKEILA THAYNE MRN: [id number] DOB: December 09, 1933 ? ?Referred by: Burnis Medin, MD ?Reason for referral : No chief complaint on file. ? ? ?An unsuccessful telephone outreach was attempted today. The patient was referred to the pharmacist for assistance with care management and care coordination.  ? ?Follow Up Plan:  ? ?Tatjana Dellinger ?Upstream Scheduler  ?

## 2021-11-28 NOTE — Progress Notes (Signed)
?  Chronic Care Management  ? ?Note ? ?11/28/2021 ?Name: Jeanne Tran MRN: [id number] DOB: Oct 30, 1933 ? ?Jeanne Tran is a 88 y.o. year old female who is a primary care patient of Panosh, Standley Brooking, MD. I reached out to [employer] by phone today in response to a referral sent by [name] PCP, Panosh, Standley Brooking, MD.  ? ?[name] was given information about Chronic Care Management services today including:  ?CCM service includes personalized support from designated clinical staff supervised by her physician, including individualized plan of care and coordination with other care providers ?24/7 contact phone numbers for assistance for urgent and routine care needs. ?Service will only be billed when office clinical staff spend 20 minutes or more in a month to coordinate care. ?Only one practitioner may furnish and bill the service in a calendar month. ?The patient may stop CCM services at any time (effective at the end of the month) by phone call to the office staff. ? ? ?Patient agreed to services and verbal consent obtained.  ? ?Follow up plan: PT AWARE DEDUCTIBLE ? ? ?Tatjana Dellinger ?Upstream Scheduler  ?

## 2021-11-28 NOTE — Addendum Note (Signed)
Addended by: Geradine Girt D on: 11/28/2021 10:05 AM ? ? Modules accepted: Orders ? ?

## 2021-11-28 NOTE — Progress Notes (Signed)
Referral placed.

## 2021-12-03 ENCOUNTER — Telehealth: Payer: Self-pay | Admitting: Internal Medicine

## 2021-12-03 NOTE — Telephone Encounter (Signed)
Left message for patient to call back and schedule Medicare Annual Wellness Visit (AWV) either virtually or in office. Left  my Jeanne Tran number [phone] ? ? ?Last AWV 12/11/20 ?please schedule at anytime with [facility] Nurse Health Advisor 1 or 2 ? ? ?This should be a 45 minute visit.  ?

## 2021-12-14 ENCOUNTER — Ambulatory Visit (INDEPENDENT_AMBULATORY_CARE_PROVIDER_SITE_OTHER): Payer: Medicare Other

## 2021-12-14 VITALS — Ht 64.0 in | Wt 137.0 lb

## 2021-12-14 DIAGNOSIS — Z0000 Encounter for general adult medical examination without abnormal findings: Secondary | ICD-10-CM

## 2021-12-14 NOTE — Patient Instructions (Addendum)
?[name] , ?Thank you for taking time to come for your Medicare Wellness Visit. I appreciate your ongoing commitment to your health goals. Please review the following plan we discussed and let me know if I can assist you in the future.  ? ?These are the goals we discussed: ? Goals   ? ?  Increase physical activity   ?  Recommend starting with light exercise in home and walking outside when able. ?  ?  Patient Stated   ?  Try to control the diarrhea; discuss with Dr. Mal Tran 3 times as opposed to 4 times because it is making her drowsy  ?May feel better with med adjustment  ?Drink plenty of fluids  ?  ?  Reduce fat intake to X grams per day   ?  Drinks a lot of water -  ?Decrease sweet and fat intake  ?  ? ?  ?  ?This is a list of the screening recommended for you and due dates:  ?Health Maintenance  ?Topic Date Due  ? COVID-19 Vaccine (4 - Booster for Pfizer series) 03/24/2022*  ? Zoster (Shingles) Vaccine (2 of 2) 09/21/2026*  ? Mammogram  10/17/2022  ? Tetanus Vaccine  12/15/2023  ? Pneumonia Vaccine  Completed  ? Flu Shot  Completed  ? DEXA scan (bone density measurement)  Completed  ? HPV Vaccine  Aged Out  ?*Topic was postponed. The date shown is not the original due date.  ? ?Advanced directives: Yes  ? ?Conditions/risks identified: None ? ?Next appointment: Follow up in one year for your annual wellness visit  ? ? ?Preventive Care 86 Years and Older, Female ?Preventive care refers to lifestyle choices and visits with your health care provider that can promote health and wellness. ?What does preventive care include? ?A yearly physical exam. This is also called an annual well check. ?Dental exams once or twice a year. ?Routine eye exams. Ask your health care provider how often you should have your eyes checked. ?Personal lifestyle choices, including: ?Daily care of your teeth and gums. ?Regular physical activity. ?Eating a healthy diet. ?Avoiding tobacco and drug use. ?Limiting alcohol  use. ?Practicing safe sex. ?Taking low-dose aspirin every day. ?Taking vitamin and mineral supplements as recommended by your health care provider. ?What happens during an annual well check? ?The services and screenings done by your health care provider during your annual well check will depend on your age, overall health, lifestyle risk factors, and family history of disease. ?Counseling  ?Your health care provider may ask you questions about your: ?Alcohol use. ?Tobacco use. ?Drug use. ?Emotional well-being. ?Home and relationship well-being. ?Sexual activity. ?Eating habits. ?History of falls. ?Memory and ability to understand (cognition). ?Work and work Statistician. ?Reproductive health. ?Screening  ?You may have the following tests or measurements: ?Height, weight, and BMI. ?Blood pressure. ?Lipid and cholesterol levels. These may be checked every 5 years, or more frequently if you are over 37 years old. ?Skin check. ?Lung cancer screening. You may have this screening every year starting at age 20 if you have a 30-pack-year history of smoking and currently smoke or have quit within the past 15 years. ?Fecal occult blood test (FOBT) of the stool. You may have this test every year starting at age 13. ?Flexible sigmoidoscopy or colonoscopy. You may have a sigmoidoscopy every 5 years or a colonoscopy every 10 years starting at age 20. ?Hepatitis C blood test. ?Hepatitis B blood test. ?Sexually transmitted disease (STD) testing. ?Diabetes screening. This is  done by checking your blood sugar (glucose) after you have not eaten for a while (fasting). You may have this done every 1-3 years. ?Bone density scan. This is done to screen for osteoporosis. You may have this done starting at age 20. ?Mammogram. This may be done every 1-2 years. Talk to your health care provider about how often you should have regular mammograms. ?Talk with your health care provider about your test results, treatment options, and if necessary,  the need for more tests. ?Vaccines  ?Your health care provider may recommend certain vaccines, such as: ?Influenza vaccine. This is recommended every year. ?Tetanus, diphtheria, and acellular pertussis (Tdap, Td) vaccine. You may need a Td booster every 10 years. ?Zoster vaccine. You may need this after age 69. ?Pneumococcal 13-valent conjugate (PCV13) vaccine. One dose is recommended after age 54. ?Pneumococcal polysaccharide (PPSV23) vaccine. One dose is recommended after age 79. ?Talk to your health care provider about which screenings and vaccines you need and how often you need them. ?This information is not intended to replace advice given to you by your health care provider. Make sure you discuss any questions you have with your health care provider. ?Document Released: 09/29/2015 Document Revised: 05/22/2016 Document Reviewed: 07/04/2015 ?Elsevier Interactive Patient Education ? 2017 [place]. ? ?Fall Prevention in the Home ?Falls can cause injuries. They can happen to people of all ages. There are many things you can do to make your home safe and to help prevent falls. ?What can I do on the outside of my home? ?Regularly fix the edges of walkways and driveways and fix any cracks. ?Remove anything that might make you trip as you walk through a door, such as a raised step or threshold. ?Trim any bushes or trees on the path to your home. ?Use bright outdoor lighting. ?Clear any walking paths of anything that might make someone trip, such as rocks or tools. ?Regularly check to see if handrails are loose or broken. Make sure that both sides of any steps have handrails. ?Any raised decks and porches should have guardrails on the edges. ?Have any leaves, snow, or ice cleared regularly. ?Use sand or salt on walking paths during winter. ?Clean up any spills in your garage right away. This includes oil or grease spills. ?What can I do in the bathroom? ?Use night lights. ?Install grab bars by the toilet and in the  tub and shower. Do not use towel bars as grab bars. ?Use non-skid mats or decals in the tub or shower. ?If you need to sit down in the shower, use a plastic, non-slip stool. ?Keep the floor dry. Clean up any water that spills on the floor as soon as it happens. ?Remove soap buildup in the tub or shower regularly. ?Attach bath mats securely with double-sided non-slip rug tape. ?Do not have throw rugs and other things on the floor that can make you trip. ?What can I do in the bedroom? ?Use night lights. ?Make sure that you have a light by your bed that is easy to reach. ?Do not use any sheets or blankets that are too big for your bed. They should not hang down onto the floor. ?Have a firm chair that has side arms. You can use this for support while you get dressed. ?Do not have throw rugs and other things on the floor that can make you trip. ?What can I do in the kitchen? ?Clean up any spills right away. ?Avoid walking on wet floors. ?Keep items that you  use a lot in easy-to-reach places. ?If you need to reach something above you, use a strong step stool that has a grab bar. ?Keep electrical cords out of the way. ?Do not use floor polish or wax that makes floors slippery. If you must use wax, use non-skid floor wax. ?Do not have throw rugs and other things on the floor that can make you trip. ?What can I do with my stairs? ?Do not leave any items on the stairs. ?Make sure that there are handrails on both sides of the stairs and use them. Fix handrails that are broken or loose. Make sure that handrails are as long as the stairways. ?Check any carpeting to make sure that it is firmly attached to the stairs. Fix any carpet that is loose or worn. ?Avoid having throw rugs at the top or bottom of the stairs. If you do have throw rugs, attach them to the floor with carpet tape. ?Make sure that you have a light switch at the top of the stairs and the bottom of the stairs. If you do not have them, ask someone to add them for  you. ?What else can I do to help prevent falls? ?Wear shoes that: ?Do not have high heels. ?Have rubber bottoms. ?Are comfortable and fit you well. ?Are closed at the toe. Do not wear sandals. ?If you use a stepladd

## 2021-12-14 NOTE — Progress Notes (Signed)
? ?Subjective:  ? Jeanne Tran is a 88 y.o. female who presents for Medicare Annual (Subsequent) preventive examination. ? ?Review of Systems    ?Virtual Visit via Telephone Note ? ?I connected with  [name] on 12/14/21 at 12:30 PM EDT by telephone and verified that I am speaking with the correct person using two identifiers. ? ?Location: ?Patient: Home ?Provider: Office ?Persons participating in the virtual visit: patient/Nurse Health Advisor ?  ?I discussed the limitations, risks, security and privacy concerns of performing an evaluation and management service by telephone and the availability of in person appointments. The patient expressed understanding and agreed to proceed. ? ?Interactive audio and video telecommunications were attempted between this nurse and patient, however failed, due to patient having technical difficulties OR patient did not have access to video capability.  We continued and completed visit with audio only. ? ?Some vital signs may be absent or patient reported.  ? ?[name], LPN  ?Cardiac Risk Factors include: advanced age (>56mn, >>52women) ? ?   ?Objective:  ?  ?Today's Vitals  ? 12/14/21 1235  ?Weight: 137 lb (62.1 kg)  ?Height: '5\' 4"'$  (1.626 m)  ? ?Body mass index is 23.52 kg/m?. ? ? ?  12/14/2021  ? 12:47 PM 12/11/2020  ?  1:28 PM 08/17/2019  ?  4:06 PM 07/22/2018  ?  2:34 PM 11/06/2017  ?  3:08 PM 10/01/2016  ?  2:53 PM 03/22/2016  ?  2:36 PM  ?Advanced Directives  ?Does Patient Have a Medical Advance Directive? Yes Yes Yes Yes Yes Yes Yes  ?Type of AParamed[place]of [place] will [place] will Healthcare Power of [place] will   ?Does patient want to make changes to medical advance directive? No - Patient declined No - Patient declined No - Patient declined      ?Copy of HKelsoin Chart? No - copy requested No - copy requested No - copy requested  No  - copy requested Yes No - copy requested  ? ? ?Current Medications (verified) ?Outpatient Encounter Medications as of 12/14/2021  ?Medication Sig  ? AMBIEN 10 MG tablet TAKE 1/2 TABLET BY MOUTH AT BEDTIME. AVOID DAILY USE. MAY TAKE AN ADDITIONAL 1/2 TABLET IF NEEDED  ? BIOTIN PO Take 2,000 mcg by mouth daily.  ? calcitonin, salmon, (MIACALCIN/FORTICAL) 200 UNIT/ACT nasal spray USE 1 SPRAY INTO ALTERNATE NOSTRILS DAILY  ? CALCIUM PO Take 1,000 mg by mouth.  ? cetirizine (ZYRTEC) 10 MG tablet Take 10 mg by mouth daily.  ? Cholecalciferol (VITAMIN D3) 2000 UNITS TABS Take 1 tablet by mouth daily.  ? dicyclomine (BENTYL) 20 MG tablet TAKE 1 TABLET BY MOUTH THREE TIMES DAILY BEFORE MEALS AND AT BEDTIME  ? famotidine (PEPCID) 20 MG tablet Take 1 tablet (20 mg total) by mouth 2 (two) times daily.  ? fish oil-omega-3 fatty acids 1000 MG capsule Take 1 g by mouth daily.  ? fluocinonide-emollient (LIDEX-E) 0.05 % cream Apply 1 application topically 2 (two) times daily. As  Directed. (Patient taking differently: Apply 1 application. topically 2 (two) times daily. As  Directed.)  ? mirtazapine (REMERON) 15 MG tablet TAKE 1 TO 2 TABLETS BY MOUTH AT BEDTIME FOR SLEEP  ? Multiple Vitamin (MULTIVITAMIN) tablet Take 1 tablet by mouth daily.  ? Probiotic Product ([employer] PROBIOTIC) CHEW Chew 2 capsules by mouth daily.  ? rosuvastatin (CRESTOR) 5 MG tablet TAKE 1 TABLET(5 MG)  BY MOUTH DAILY  ? sertraline (ZOLOFT) 100 MG tablet TAKE 1 TABLET(100 MG) BY MOUTH DAILY  ? ?No facility-administered encounter medications on file as of 12/14/2021.  ? ? ?Allergies (verified) ?Codeine and Aspirin  ? ?History: ?Past Medical History:  ?Diagnosis Date  ? Breast cancer ([place])   ? hx of left with radiation and lumpectomy  ? Diverticulosis   ? GERD (gastroesophageal reflux disease)   ? History of colonic polyps   ? History of ERCP   ? and sphincterotomy for abnormal lfts and dilated biliary ducts 5/05  ? Hyperlipidemia   ? Migraine headache   ? Pulmonary  nodule   ? RLS (restless legs syndrome)   ? Wears contact lenses   ? ?Past Surgical History:  ?Procedure Laterality Date  ? ABDOMINAL HYSTERECTOMY    ? bso  ? APPENDECTOMY    ? BREAST LUMPECTOMY  1995  ? lt lumpsnbx  ? BREAST LUMPECTOMY WITH RADIOACTIVE SEED LOCALIZATION Left 07/01/2014  ? Procedure: LEFT BREAST LUMPECTOMY WITH RADIOACTIVE SEED LOCALIZATION;  Surgeon: [name], MD;  Location: [place];  Service: General;  Laterality: Left;  ? CERVICAL SPINE SURGERY  3/03  ? CHOLECYSTECTOMY  2012  ? Complete Hysterectomy    ? CYSTOCELE REPAIR    ? ERCP    ? for abnormal lfts and dilated biliary ducts 5/05  ? gallbladder duct open  2006  ? HEMORRHOID SURGERY    ? HYSTEROSCOPY    ? LYMPH NODE BIOPSY    ? Orthoscopic Rt Knee    ? SPHINCTEROTOMY    ? for abnormal lfts and dilated biliary ducts 5/05  ? tonsillectomy    ? ?Family History  ?Problem Relation Age of Onset  ? Mitral valve prolapse Son   ?     with afib to have surgery  ? ?Social History  ? ?Socioeconomic History  ? Marital status: Widowed  ?  Spouse name: Not on file  ? Number of children: 3  ? Years of education: Not on file  ? Highest education level: Not on file  ?Occupational History  ? Not on file  ?Tobacco Use  ? Smoking status: Former  ?  Packs/day: 3.00  ?  Years: 25.00  ?  Pack years: 75.00  ?  Types: Cigarettes  ?  Quit date: 02/26/1994  ?  Years since quitting: 27.8  ? Smokeless tobacco: Never  ? Tobacco comments:  ?  quit 24 years   ?Vaping Use  ? Vaping Use: Never used  ?Substance and Sexual Activity  ? Alcohol use: No  ? Drug use: No  ? Sexual activity: Not on file  ?Other Topics Concern  ? Not on file  ?Social History Narrative  ? Retired; widowed  ? 2 sons  ? Plays bridge twice monthly  ? Still drives  ?   ?   ? ?Social Determinants of Health  ? ?Financial Resource Strain: Low Risk   ? Difficulty of Paying Living Expenses: Not hard at all  ?Food Insecurity: No Food Insecurity  ? Worried About Charity fundraiser in the  Last Year: Never true  ? Ran Out of Food in the Last Year: Never true  ?Transportation Needs: Unknown  ? Lack of Transportation (Medical): No  ? Lack of Transportation (Non-Medical): Not on file  ?Physical Activity: Inactive  ? Days of Exercise per Week: 0 days  ? Minutes of Exercise per Session: 0 min  ?Stress: No Stress Concern Present  ? Feeling of  Stress : Not at all  ?Social Connections: Socially Isolated  ? Frequency of Communication with Friends and Family: More than three times a week  ? Frequency of Social Gatherings with Friends and Family: More than three times a week  ? Attends Religious Services: Never  ? Active Member of Clubs or Organizations: No  ? Attends Archivist Meetings: Never  ? Marital Status: Widowed  ? ? ? ?Clinical Intake: ? ?Pre-visit preparation completed: NoDiabetic?  No ? ?Interpreter Needed?: No ?Activities of Daily Living ? ?  12/14/2021  ? 12:42 PM  ?In your present state of health, do you have any difficulty performing the following activities:  ?Hearing? 0  ?Vision? 0  ?Difficulty concentrating or making decisions? 0  ?Walking or climbing stairs? 0  ?Dressing or bathing? 0  ?Doing errands, shopping? 0  ?Preparing Food and eating ? N  ?Using the Toilet? N  ?In the past six months, have you accidently leaked urine? Y  ?Comment Wears pads  ?Do you have problems with loss of bowel control? N  ?Managing your Medications? N  ?Managing your Finances? N  ?Housekeeping or managing your Housekeeping? N  ? ? ?Patient Care Team: ?Panosh, Standley Brooking, MD as PCP - General ?Dyke Maes, OD (Optometry) ?Kary Kos, MD (Neurosurgery) ?[name], MD as Consulting Physician (General Surgery) ?Truitt Merle, MD as Consulting Physician (Hematology) ?Daryll Brod, MD as Consulting Physician (Orthopedic Surgery) ?Ladene Artist, MD as Consulting Physician (Gastroenterology) ?Warden Fillers, MD as Consulting Physician (Ophthalmology) ?Harriett Sine, MD as Consulting Physician  (Dermatology) ?Viona Gilmore, [facility] as Pharmacist (Pharmacist) ? ?Indicate any recent Medical Services you may have received from other than [facility] providers in the past year (date may be approximate). ? ?   ?Ass

## 2021-12-18 ENCOUNTER — Ambulatory Visit: Payer: Medicare Other

## 2021-12-27 ENCOUNTER — Other Ambulatory Visit: Payer: Self-pay | Admitting: Family Medicine

## 2021-12-27 ENCOUNTER — Other Ambulatory Visit: Payer: Self-pay | Admitting: Internal Medicine

## 2021-12-27 DIAGNOSIS — S32010S Wedge compression fracture of first lumbar vertebra, sequela: Secondary | ICD-10-CM

## 2022-01-06 ENCOUNTER — Other Ambulatory Visit: Payer: Self-pay | Admitting: Internal Medicine

## 2022-01-07 ENCOUNTER — Ambulatory Visit (INDEPENDENT_AMBULATORY_CARE_PROVIDER_SITE_OTHER): Payer: Medicare Other | Admitting: Internal Medicine

## 2022-01-07 ENCOUNTER — Encounter: Payer: Self-pay | Admitting: Internal Medicine

## 2022-01-07 VITALS — BP 130/84 | HR 73 | Temp 97.5°F | Ht 64.0 in | Wt 125.2 lb

## 2022-01-07 DIAGNOSIS — Z853 Personal history of malignant neoplasm of breast: Secondary | ICD-10-CM | POA: Diagnosis not present

## 2022-01-07 DIAGNOSIS — R2689 Other abnormalities of gait and mobility: Secondary | ICD-10-CM

## 2022-01-07 DIAGNOSIS — Z79899 Other long term (current) drug therapy: Secondary | ICD-10-CM | POA: Diagnosis not present

## 2022-01-07 DIAGNOSIS — N1832 Chronic kidney disease, stage 3b: Secondary | ICD-10-CM | POA: Diagnosis not present

## 2022-01-07 DIAGNOSIS — G4700 Insomnia, unspecified: Secondary | ICD-10-CM

## 2022-01-07 DIAGNOSIS — S32010S Wedge compression fracture of first lumbar vertebra, sequela: Secondary | ICD-10-CM | POA: Diagnosis not present

## 2022-01-07 DIAGNOSIS — R634 Abnormal weight loss: Secondary | ICD-10-CM | POA: Diagnosis not present

## 2022-01-07 DIAGNOSIS — R413 Other amnesia: Secondary | ICD-10-CM

## 2022-01-07 NOTE — Patient Instructions (Addendum)
I would like [place] to wean down the ambien to 5 mg  .  Concern causing  fogginess and risk of falls.  ? ?Concern about weight loss   and your memory  ?Will want more info about the back spine.  Considering sending you to the specialist   about the spine  predicament ., ?Consider more evaluation about balance  and memory issues  neuro  rehab consult  ? ?Please do a sleep diary for at least 2 weeks    and decrease the ambien to 5 mg total ? ?Need day light in day and dark at night or sleep time.  ? ?Want to change  sleep to night and awake in day .  ?Will ask referral  persons to contact  [facility]   in addition to you . ? ?Miacalcin can be stopped    ? considering  change to fosamax again  but was awaiting  updated spine imaging .  ?Can contact  [facility] bout mri  ordered . ? ? ? ? ? ? ? ?

## 2022-01-07 NOTE — Progress Notes (Signed)
? ?Chief Complaint  ?Patient presents with  ? Follow-up  ?  Not comfortable walking  ?Back pain  ?Forgetful  ?Sitting around most of the time not active  ? ? ?HPI: ?Jeanne Tran 88 y.o. come in for Chronic disease management here with son Jeanne Tran today ?Since last visit she just does not feel well does not really point to the back pain but states that she is unsteady and is now using a walker all the time just to be sure she does not fall. ?Perhaps more forgetful according to son does ADLs well he takes care of finances ?She states her sleep is still not good is taking Ambien 5 mg and then repeating it according to son it appears that she sleeps through a lot of the day and then is up at night. ? ?Does not really leave the house much for social events or other. ?Does not complain of her arthritis being interfering. ?Is finishing up the Miacalcin runs out early she may have been using it 2 sprays a day. ?Past history of using Fosamax in the past. ?MRI of back still not done ?ROS: See pertinent positives and negatives per HPI.  According to son she eats pretty well although she states her appetite was down previously there is some weight loss.  No UTI symptoms on questioning some bathroom issues but no abdominal pain. ?Supposed to be off the Remeron she does use a pillbox to keep her medicines straight ? ?Past Medical History:  ?Diagnosis Date  ? Breast cancer ([place])   ? hx of left with radiation and lumpectomy  ? Diverticulosis   ? GERD (gastroesophageal reflux disease)   ? History of colonic polyps   ? History of ERCP   ? and sphincterotomy for abnormal lfts and dilated biliary ducts 5/05  ? Hyperlipidemia   ? Migraine headache   ? Pulmonary nodule   ? RLS (restless legs syndrome)   ? Wears contact lenses   ? ? ?Family History  ?Problem Relation Age of Onset  ? Mitral valve prolapse Son   ?     with afib to have surgery  ? ? ?Social History  ? ?Socioeconomic History  ? Marital status: Widowed  ?  Spouse name: Not  on file  ? Number of children: 3  ? Years of education: Not on file  ? Highest education level: Not on file  ?Occupational History  ? Not on file  ?Tobacco Use  ? Smoking status: Former  ?  Packs/day: 3.00  ?  Years: 25.00  ?  Pack years: 75.00  ?  Types: Cigarettes  ?  Quit date: 02/26/1994  ?  Years since quitting: 27.8  ? Smokeless tobacco: Never  ? Tobacco comments:  ?  quit 24 years   ?Vaping Use  ? Vaping Use: Never used  ?Substance and Sexual Activity  ? Alcohol use: No  ? Drug use: No  ? Sexual activity: Not on file  ?Other Topics Concern  ? Not on file  ?Social History Narrative  ? Retired; widowed  ? 2 sons  ? Plays bridge twice monthly  ? Still drives  ?   ?   ? ?Social Determinants of Health  ? ?Financial Resource Strain: Low Risk   ? Difficulty of Paying Living Expenses: Not hard at all  ?Food Insecurity: No Food Insecurity  ? Worried About Charity fundraiser in the Last Year: Never true  ? Ran Out of Food in the Last Year:  Never true  ?Transportation Needs: Unknown  ? Lack of Transportation (Medical): No  ? Lack of Transportation (Non-Medical): Not on file  ?Physical Activity: Inactive  ? Days of Exercise per Week: 0 days  ? Minutes of Exercise per Session: 0 min  ?Stress: No Stress Concern Present  ? Feeling of Stress : Not at all  ?Social Connections: Socially Isolated  ? Frequency of Communication with Friends and Family: More than three times a week  ? Frequency of Social Gatherings with Friends and Family: More than three times a week  ? Attends Religious Services: Never  ? Active Member of Clubs or Organizations: No  ? Attends Archivist Meetings: Never  ? Marital Status: Widowed  ? ? ?Outpatient Medications Prior to Visit  ?Medication Sig Dispense Refill  ? AMBIEN 10 MG tablet TAKE 1/2 TABLET BY MOUTH AT BEDTIME. AVOID DAILY USE. MAY TAKE AN ADDITIONAL 1/2 TABLET IF NEEDED 90 tablet 0  ? BIOTIN PO Take 2,000 mcg by mouth daily.    ? calcitonin, salmon, (MIACALCIN/FORTICAL) 200 UNIT/ACT  nasal spray USE 1 SPRAY INTO ALTERNATE NOSTRILS DAILY 3.7 mL 2  ? CALCIUM PO Take 1,000 mg by mouth.    ? cetirizine (ZYRTEC) 10 MG tablet Take 10 mg by mouth daily.    ? Cholecalciferol (VITAMIN D3) 2000 UNITS TABS Take 1 tablet by mouth daily.    ? dicyclomine (BENTYL) 20 MG tablet TAKE 1 TABLET BY MOUTH THREE TIMES DAILY BEFORE MEALS AND AT BEDTIME 270 tablet 1  ? famotidine (PEPCID) 20 MG tablet Take 1 tablet (20 mg total) by mouth 2 (two) times daily. 180 tablet 1  ? fish oil-omega-3 fatty acids 1000 MG capsule Take 1 g by mouth daily.    ? fluocinonide-emollient (LIDEX-E) 0.05 % cream Apply 1 application topically 2 (two) times daily. As  Directed. (Patient taking differently: Apply 1 application. topically 2 (two) times daily. As  Directed.) 30 g 0  ? mirtazapine (REMERON) 15 MG tablet TAKE 1 TO 2 TABLETS BY MOUTH AT BEDTIME FOR SLEEP 180 tablet 0  ? Multiple Vitamin (MULTIVITAMIN) tablet Take 1 tablet by mouth daily.    ? Probiotic Product ([employer] PROBIOTIC) CHEW Chew 2 capsules by mouth daily.    ? rosuvastatin (CRESTOR) 5 MG tablet TAKE 1 TABLET(5 MG) BY MOUTH DAILY 90 tablet 0  ? sertraline (ZOLOFT) 100 MG tablet TAKE 1 TABLET(100 MG) BY MOUTH DAILY 90 tablet 1  ? ?No facility-administered medications prior to visit.  ? ? ? ?EXAM: ? ?BP 130/84 (BP Location: Right Arm, Patient Position: Sitting, Cuff Size: Normal)   Pulse 73   Temp (!) 97.5 ?F (36.4 ?C) (Oral)   Ht '5\' 4"'$  (1.626 m)   Wt 125 lb 3.2 oz (56.8 kg)   SpO2 94%   BMI 21.49 kg/m?  ? ?Body mass index is 21.49 kg/m?. ? next like she has lost weight ?GENERAL: vitals reviewed and listed above, alert, could not give the date or the month but other findings as location and year and person were normal.  Appears well hydrated and in no acute distress ?HEENT: atraumatic, conjunctiva  clear, no obvious abnormalities on inspection of external nose and ears  ?NECK: no obvious masses on inspection palpation no obvious adenopathy ?LUNGS: clear to auscultation  bilaterally, no wheezes, rales or rhonchi, good air movement ?CV: HRRR, no clubbing cyanosis or  peripheral edema nl cap refill  ?MS: moves all extremities without noticeable focal  abnormality arthritic changes no point spine tenderness  able to arise from chair using walker walks quite well and turns feels unsteady when not using walker ?PSYCH: pleasant and cooperative, ?Lab Results  ?Component Value Date  ? WBC 6.8 06/13/2021  ? HGB 13.0 06/13/2021  ? HCT 39.0 06/13/2021  ? PLT 205.0 06/13/2021  ? GLUCOSE 98 09/24/2021  ? CHOL 182 06/13/2021  ? TRIG 116.0 06/13/2021  ? HDL 72.20 06/13/2021  ? [place] 86 06/13/2021  ? ALT 24 06/13/2021  ? AST 28 06/13/2021  ? NA 141 09/24/2021  ? K 5.1 09/24/2021  ? CL 104 09/24/2021  ? CREATININE 1.73 (H) 09/24/2021  ? BUN 34 (H) 09/24/2021  ? CO2 29 09/24/2021  ? TSH 3.73 12/27/2015  ? INR 1.00 06/29/2014  ? HGBA1C 6.3 01/22/2019  ? MICROALBUR <0.7 06/13/2021  ? ?BP Readings from Last 3 Encounters:  ?01/07/22 130/84  ?09/24/21 130/76  ?08/29/21 124/76  ? ? ?ASSESSMENT AND PLAN: ? ?Discussed the following assessment and plan: ? ?Weight loss - Plan: CBC with Differential/Platelet, Basic metabolic panel, TSH, Vitamin B12, C-reactive protein, Hepatic function panel, Hepatic function panel, C-reactive protein, Vitamin B12, TSH, Basic metabolic panel, CBC with Differential/Platelet ? ?Balance disorder - Plan: CBC with Differential/Platelet, Basic metabolic panel, TSH, Vitamin B12, C-reactive protein, Hepatic function panel, Hepatic function panel, C-reactive protein, Vitamin B12, TSH, Basic metabolic panel, CBC with Differential/Platelet ? ?Medication management - Plan: CBC with Differential/Platelet, Basic metabolic panel, TSH, Vitamin B12, C-reactive protein, Hepatic function panel, Hepatic function panel, C-reactive protein, Vitamin B12, TSH, Basic metabolic panel, CBC with Differential/Platelet ? ?Compression fracture of L1 vertebra, sequela - Plan: CBC with Differential/Platelet,  Basic metabolic panel, TSH, Vitamin B12, C-reactive protein, Hepatic function panel, Hepatic function panel, C-reactive protein, Vitamin B12, TSH, Basic metabolic panel, CBC with Differential/Platelet ?

## 2022-01-08 LAB — CBC WITH DIFFERENTIAL/PLATELET
Basophils Absolute: 0.1 10*3/uL (ref 0.0–0.1)
Basophils Relative: 1.3 % (ref 0.0–3.0)
Eosinophils Absolute: 0.3 10*3/uL (ref 0.0–0.7)
Eosinophils Relative: 3.8 % (ref 0.0–5.0)
HCT: 36.5 % (ref 36.0–46.0)
Hemoglobin: 11.9 g/dL — ABNORMAL LOW (ref 12.0–15.0)
Lymphocytes Relative: 25.1 % (ref 12.0–46.0)
Lymphs Abs: 1.7 10*3/uL (ref 0.7–4.0)
MCHC: 32.7 g/dL (ref 30.0–36.0)
MCV: 97.9 fl (ref 78.0–100.0)
Monocytes Absolute: 0.8 10*3/uL (ref 0.1–1.0)
Monocytes Relative: 12.3 % — ABNORMAL HIGH (ref 3.0–12.0)
Neutro Abs: 3.8 10*3/uL (ref 1.4–7.7)
Neutrophils Relative %: 57.5 % (ref 43.0–77.0)
Platelets: 264 10*3/uL (ref 150.0–400.0)
RBC: 3.73 Mil/uL — ABNORMAL LOW (ref 3.87–5.11)
RDW: 13.3 % (ref 11.5–15.5)
WBC: 6.6 10*3/uL (ref 4.0–10.5)

## 2022-01-08 LAB — HEPATIC FUNCTION PANEL
ALT: 12 U/L (ref 0–35)
AST: 19 U/L (ref 0–37)
Albumin: 4.1 g/dL (ref 3.5–5.2)
Alkaline Phosphatase: 108 U/L (ref 39–117)
Bilirubin, Direct: 0.1 mg/dL (ref 0.0–0.3)
Total Bilirubin: 0.4 mg/dL (ref 0.2–1.2)
Total Protein: 6.8 g/dL (ref 6.0–8.3)

## 2022-01-08 LAB — BASIC METABOLIC PANEL
BUN: 33 mg/dL — ABNORMAL HIGH (ref 6–23)
CO2: 28 mEq/L (ref 19–32)
Calcium: 10.1 mg/dL (ref 8.4–10.5)
Chloride: 104 mEq/L (ref 96–112)
Creatinine, Ser: 1.83 mg/dL — ABNORMAL HIGH (ref 0.40–1.20)
GFR: 24.74 mL/min — ABNORMAL LOW (ref >60.00)
Glucose, Bld: 88 mg/dL (ref 70–99)
Potassium: 4.5 mEq/L (ref 3.5–5.1)
Sodium: 139 mEq/L (ref 135–145)

## 2022-01-08 LAB — TSH: TSH: 6.98 u[IU]/mL — ABNORMAL HIGH (ref 0.35–5.50)

## 2022-01-08 LAB — VITAMIN B12: Vitamin B-12: 278 pg/mL (ref 211–911)

## 2022-01-08 LAB — C-REACTIVE PROTEIN: CRP: <1 mg/dL (ref 0.5–20.0)

## 2022-01-12 ENCOUNTER — Ambulatory Visit
Admission: RE | Admit: 2022-01-12 | Discharge: 2022-01-12 | Disposition: A | Discharge status: Home / Self Care | Payer: Medicare Other | Source: Ambulatory Visit | Attending: Internal Medicine | Admitting: Internal Medicine

## 2022-01-12 DIAGNOSIS — M545 Low back pain, unspecified: Secondary | ICD-10-CM | POA: Diagnosis not present

## 2022-01-12 DIAGNOSIS — M48061 Spinal stenosis, lumbar region without neurogenic claudication: Secondary | ICD-10-CM | POA: Diagnosis not present

## 2022-01-12 DIAGNOSIS — S32010A Wedge compression fracture of first lumbar vertebra, initial encounter for closed fracture: Secondary | ICD-10-CM

## 2022-01-12 DIAGNOSIS — Z853 Personal history of malignant neoplasm of breast: Secondary | ICD-10-CM

## 2022-01-14 ENCOUNTER — Telehealth: Payer: Self-pay | Admitting: Internal Medicine

## 2022-01-14 ENCOUNTER — Other Ambulatory Visit: Payer: Self-pay

## 2022-01-14 ENCOUNTER — Telehealth: Payer: Self-pay | Admitting: Pharmacist

## 2022-01-14 ENCOUNTER — Telehealth: Payer: Self-pay

## 2022-01-14 DIAGNOSIS — G589 Mononeuropathy, unspecified: Secondary | ICD-10-CM

## 2022-01-14 DIAGNOSIS — T148XXA Other injury of unspecified body region, initial encounter: Secondary | ICD-10-CM

## 2022-01-14 NOTE — Progress Notes (Signed)
MRi shows arthritis and pinched nerve   the compression fracture  has progressed some  and  a number or disc and pinched nerve s.  ?Please do referral to spine neurosurgery  for  further opinion and evaluation    asap.  ?Please place referral and contact son Jeanne Tran about this  ( also see lab results   stable excpet thyroid is slightly off )

## 2022-01-14 NOTE — Progress Notes (Signed)
Kidney stable , b12  low normal , thyroid borderline off . ?Begin b12 dissolvable 1000 or 2000 mcg per day  ?Plan   arrange /order   b12 level and  tsh and free t4  in 2 months and then OV follo wup  ?(See mri results  and request  neuro surgery referral appt . ) ?Report this to patient and to son [name]

## 2022-01-14 NOTE — Telephone Encounter (Signed)
Diane from [facility] called with a call report for patient. Can be reached at  ?

## 2022-01-14 NOTE — Chronic Care Management (AMB) (Signed)
? ? ?Chronic Care Management ?Pharmacy Assistant  ? ?Name: SARON VANORMAN  MRN: [id number] DOB: [birth year] ? ?Jeanne Tran is an 88 y.o. year old female who presents for her initial CCM visit with the clinical pharmacist. ? ?Reason for Encounter: Chart prep for initial visit with Jeni Salles Clinical Pharmacist on 01/16/2022 at 10:30 via the telephone.  ?  ?Conditions to be addressed/monitored: ?HLD, GERD, Osteoarthritis, and breast cancer, Osteopenia and RA ? ?Recent office visits:  ?01/07/2022 Shanon Ace MD - Patient was seen for weight loss and additional issues. No medication changes. Follow up in 1-2 months.  ? ?12/14/2021 Rolene Arbour LPN - Medicare annual wellness exam.  ? ?11/21/2021 Shanon Ace MD - Patient was seen for Insomnia, chronic and additional issues. No medication changes. No follow up noted.  ? ?10/31/2021 Shanon Ace MD - Patient was seen for Closed compression fracture of body of L1 vertebra and additional issues. No medication changes. Follow up in 1 month. ? ?09/26/2021 - 10/22/2021 Shanon Ace MD Patient was seen for restless leg syndrome and additional issues. No other chart notes available.  ? ?09/24/2021 Shanon Ace MD - Patient was seen for Closed compression fracture of body of L1 vertebra and additional issues. No medication changes. Follow up in 1 month.  ? ?08/29/2022 Betty Martinique MD - Patient was seen for fall in home, initial encounter and additional issues. Referral to home health. No medication changes. Follow up in 2 weeks. ? ?Recent consult visits:  ?None ? ?Hospital visits:  ?None ? ?Medications: ?Outpatient Encounter Medications as of 01/14/2022  ?Medication Sig  ? AMBIEN 10 MG tablet TAKE 1/2 TABLET BY MOUTH AT BEDTIME. AVOID DAILY USE. MAY TAKE AN ADDITIONAL 1/2 TABLET IF NEEDED  ? BIOTIN PO Take 2,000 mcg by mouth daily.  ? calcitonin, salmon, (MIACALCIN/FORTICAL) 200 UNIT/ACT nasal spray USE 1 SPRAY INTO ALTERNATE NOSTRILS DAILY  ? CALCIUM PO Take 1,000  mg by mouth.  ? cetirizine (ZYRTEC) 10 MG tablet Take 10 mg by mouth daily.  ? Cholecalciferol (VITAMIN D3) 2000 UNITS TABS Take 1 tablet by mouth daily.  ? dicyclomine (BENTYL) 20 MG tablet TAKE 1 TABLET BY MOUTH THREE TIMES DAILY BEFORE MEALS AND AT BEDTIME  ? famotidine (PEPCID) 20 MG tablet Take 1 tablet (20 mg total) by mouth 2 (two) times daily.  ? fish oil-omega-3 fatty acids 1000 MG capsule Take 1 g by mouth daily.  ? fluocinonide-emollient (LIDEX-E) 0.05 % cream Apply 1 application topically 2 (two) times daily. As  Directed. (Patient taking differently: Apply 1 application. topically 2 (two) times daily. As  Directed.)  ? mirtazapine (REMERON) 15 MG tablet TAKE 1 TO 2 TABLETS BY MOUTH AT BEDTIME FOR SLEEP  ? Multiple Vitamin (MULTIVITAMIN) tablet Take 1 tablet by mouth daily.  ? Probiotic Product ([employer]S PROBIOTIC) CHEW Chew 2 capsules by mouth daily.  ? rosuvastatin (CRESTOR) 5 MG tablet TAKE 1 TABLET(5 MG) BY MOUTH DAILY  ? sertraline (ZOLOFT) 100 MG tablet TAKE 1 TABLET(100 MG) BY MOUTH DAILY  ? ?No facility-administered encounter medications on file as of 01/14/2022.  ?Fill History: ?CALCITONIN 200IU/NASAL SPR 30 DOSES 12/10/2021 30  ? ?DICYCLOMINE '20MG'$  TABLETS 10/10/2021 90  ? ?MIRTAZAPINE '15MG'$  TABLETS 06/23/2021 90  ? ?ROSUVASTATIN '5MG'$  TABLETS 06/23/2021 90  ? ?SERTRALINE '100MG'$  TABLETS 10/13/2021 90  ? ?AMBIEN '10MG'$  TABLETS 09/25/2021 90  ? ?Have you seen any other providers since your last visit? **Patient denies.  ? ?Any changes in your medications or health? No Patient denies any  recent changes  ? ?Any side effects from any medications? Patient denies any side effects from medications.  ? ?Do you have an symptoms or problems not managed by your medications? Patients son states she is having issues with sleep and pain, she has been following Dr. Regis Bill for this.  ? ?Any concerns about your health right now? Patient denies any new concerns.  ? ?Has your provider asked that you check blood pressure, blood  sugar, or follow special diet at home? Patient denies.  ? ?Do you get any type of exercise on a regular basis? Patient currently is not getting any exercise.  ? ?Can you think of a goal you would like to reach for your health? She would like to feel better and get back to normal.  ? ?Do you have any problems getting your medications? Patients son denies any issues getting medications.  ? ?Is there anything that you would like to discuss during the appointment? Patient denies any other issues.  ? ?Please bring medications and supplements to appointment ? ?Care Gaps: ?AWV - scheduled 12/17/2022 ?Last BP - 130/84 on 01/07/2022 ? ?Star Rating Drugs: ?Rosuvastatin 5 mg - last filled 06/23/2021 with 90 DS at [facility] verified with [facility]. ? ?Gennie Alma CMA  ?Clinical Pharmacist Assistant ?[phone] ? ?

## 2022-01-14 NOTE — Telephone Encounter (Signed)
CRITICAL VALUE STICKER ? ?CRITICAL VALUE: L1 vertebral compression fracture with progressive height loss since ?the prior lumbar spine radiographs of 08/29/2021 (now 75-80%). ?Moderate edema is present within the L1 vertebral body and left L1 ?pedicle, and findings could reflect and interval acute/early ?subacute compression fracture at this level. ? ?RECEIVER (on-site recipient of call): Elza Rafter rnc ? ?DATE & TIME NOTIFIED: 01/14/22 1104 ? ?MESSENGER: Diane ? ?MD NOTIFIED: Panosh ? ?TIME OF NOTIFICATION: 0312 ? ?RESPONSE:  Awaiting response ?

## 2022-01-15 ENCOUNTER — Other Ambulatory Visit: Payer: Self-pay

## 2022-01-15 DIAGNOSIS — G589 Mononeuropathy, unspecified: Secondary | ICD-10-CM

## 2022-01-15 DIAGNOSIS — T148XXA Other injury of unspecified body region, initial encounter: Secondary | ICD-10-CM

## 2022-01-16 ENCOUNTER — Ambulatory Visit (INDEPENDENT_AMBULATORY_CARE_PROVIDER_SITE_OTHER): Payer: PRIVATE HEALTH INSURANCE | Admitting: Pharmacist

## 2022-01-16 DIAGNOSIS — G4700 Insomnia, unspecified: Secondary | ICD-10-CM

## 2022-01-16 DIAGNOSIS — E785 Hyperlipidemia, unspecified: Secondary | ICD-10-CM

## 2022-01-16 NOTE — Progress Notes (Signed)
? ?Chronic Care Management ?Pharmacy Note ? ?01/16/2022 ?Name:  Jeanne Tran MRN:  [id number] DOB:  12-14-1933 ? ?Summary: ?Pt is not sleeping well at night with frequent bathroom trips ? ?Recommendations/Changes made from today's visit: ?-Recommended restarting calcium/vitamin D supplement ?-Recommend discussion with PCP about consideration of Prolia for osteoporosis ?-Recommended restarting sertraline at 1/2 tablet daily and then increasing to 1 full tablet in 2 weeks ?-Recommend Myrbetriq or Gemtesa for overactive bladder ?-Recommended trial of timed release melatonin 2 or 3 mg per might ?-Recommended genesight testing ?-Consider adherence packaging for medications ? ?Plan: ?Follow up after discussion with PCP ?Follow up sleep assessment in 1 month ? ? ?Subjective: ?Jeanne Tran is an 88 y.o. year old female who is a primary patient of Panosh, Standley Brooking, MD.  The CCM team was consulted for assistance with disease management and care coordination needs.   ? ?Engaged with patient by telephone for initial visit in response to provider referral for pharmacy case management and/or care coordination services.  ? ?Consent to Services:  ?The patient was given the following information about Chronic Care Management services today, agreed to services, and gave verbal consent: 1. CCM service includes personalized support from designated clinical staff supervised by the primary care provider, including individualized plan of care and coordination with other care providers 2. 24/7 contact phone numbers for assistance for urgent and routine care needs. 3. Service will only be billed when office clinical staff spend 20 minutes or more in a month to coordinate care. 4. Only one practitioner may furnish and bill the service in a calendar month. 5.The patient may stop CCM services at any time (effective at the end of the month) by phone call to the office staff. 6. The patient will be responsible for cost sharing (co-pay)  of up to 20% of the service fee (after annual deductible is met). Patient agreed to services and consent obtained. ? ?Patient Care Team: ?Panosh, Standley Brooking, MD as PCP - General ?Dyke Maes, OD (Optometry) ?Kary Kos, MD (Neurosurgery) ?[name], MD as Consulting Physician (General Surgery) ?Truitt Merle, MD as Consulting Physician (Hematology) ?Daryll Brod, MD as Consulting Physician (Orthopedic Surgery) ?Ladene Artist, MD as Consulting Physician (Gastroenterology) ?Warden Fillers, MD as Consulting Physician (Ophthalmology) ?Harriett Sine, MD as Consulting Physician (Dermatology) ?Viona Gilmore, [facility] as Pharmacist (Pharmacist) ? ?Recent office visits: ?01/07/2022 Jeanne Ace MD - Patient was seen for weight loss and additional issues. No medication changes. Follow up in 1-2 months.  ?  ?12/14/2021 Jeanne Arbour LPN - Medicare annual wellness exam.  ?  ?11/21/2021 Jeanne Ace MD - Patient was seen for Insomnia, chronic and additional issues. No medication changes. No follow up noted.  ?  ?10/31/2021 Jeanne Ace MD - Patient was seen for Closed compression fracture of body of L1 vertebra and additional issues. No medication changes. Follow up in 1 month. ?  ?09/26/2021 - 10/22/2021 Jeanne Ace MD Patient was seen for restless leg syndrome and additional issues. No other chart notes available.  ?  ?09/24/2021 Jeanne Ace MD - Patient was seen for Closed compression fracture of body of L1 vertebra and additional issues. No medication changes. Follow up in 1 month.  ?  ?08/29/2022 Betty Martinique MD - Patient was seen for fall in home, initial encounter and additional issues. Referral to home health. No medication changes. Follow up in 2 weeks. ?  ?Recent consult visits: ?None ? ?Hospital visits: ?None in previous 6 months ? ? ?Objective: ? ?Lab Results  ?  Component Value Date  ? CREATININE 1.83 (H) 01/07/2022  ? BUN 33 (H) 01/07/2022  ? GFR 24.74 (L) 01/07/2022  ? EGFR 31 (L) 04/17/2017  ?  GFRNONAA 26 (L) 07/05/2020  ? GFRAA 31 (L) 07/05/2020  ? NA 139 01/07/2022  ? K 4.5 01/07/2022  ? CALCIUM 10.1 01/07/2022  ? CO2 28 01/07/2022  ? GLUCOSE 88 01/07/2022  ? ? ?Lab Results  ?Component Value Date/Time  ? HGBA1C 6.3 01/22/2019 10:35 AM  ? HGBA1C 6.2 02/25/2018 01:13 PM  ? GFR 24.74 (L) 01/07/2022 03:27 PM  ? GFR 26.52 (L) 09/24/2021 04:19 PM  ? MICROALBUR <0.7 06/13/2021 02:51 PM  ?  ?Last diabetic Eye exam: No results found for: HMDIABEYEEXA  ?Last diabetic Foot exam: No results found for: HMDIABFOOTEX  ? ?Lab Results  ?Component Value Date  ? CHOL 182 06/13/2021  ? HDL 72.20 06/13/2021  ? [place] 86 06/13/2021  ? TRIG 116.0 06/13/2021  ? CHOLHDL 3 06/13/2021  ? ? ? ?  Latest Ref Rng & Units 01/07/2022  ?  3:27 PM 06/13/2021  ?  2:51 PM 06/07/2020  ? 12:37 PM  ?Hepatic Function  ?Total Protein 6.0 - 8.3 g/dL 6.8   7.1   6.2    ? 6.2    ?Albumin 3.5 - 5.2 g/dL 4.1   4.2     ?AST 0 - 37 U/L _0 ? 25    ?ALT 0 - 35 U/L _1 ? 18    ?Alk Phosphatase 39 - 117 U/L 108   105     ?Total Bilirubin 0.2 - 1.2 mg/dL 0.4   0.4   0.4    ? 0.4    ?Bilirubin, Direct 0.0 - 0.3 mg/dL 0.1   0.1   0.1    ? ? ?Lab Results  ?Component Value Date/Time  ? TSH 6.98 (H) 01/07/2022 03:27 PM  ? TSH 3.73 12/27/2015 01:31 PM  ? FREET4 0.79 10/18/2014 12:08 PM  ? FREET4 0.81 11/10/2012 11:30 AM  ? ? ? ?  Latest Ref Rng & Units 01/07/2022  ?  3:27 PM 06/13/2021  ?  2:51 PM 06/07/2020  ? 12:37 PM  ?CBC  ?WBC 4.0 - 10.5 K/uL 6.6   6.8   5.7    ?Hemoglobin 12.0 - 15.0 g/dL 11.9   13.0   12.4    ?Hematocrit 36.0 - 46.0 % 36.5   39.0   38.6    ?Platelets 150.0 - 400.0 K/uL 264.0   205.0   190    ? ? ?Lab Results  ?Component Value Date/Time  ? VD25OH 96.96 09/24/2021 04:19 PM  ? ? ?Clinical ASCVD: No  ?The ASCVD Risk score (Arnett DK, et al., 2019) failed to calculate for the following reasons: ?  The 2019 ASCVD risk score is only valid for ages 72 to 87   ? ? ?  12/14/2021  ? 12:40 PM 09/24/2021  ?  3:02 PM 12/11/2020  ?  1:32  PM  ?Depression screen PHQ 2/9  ?Decreased Interest 0 0 0  ?Down, Depressed, Hopeless 0 0 0  ?PHQ - 2 Score 0 0 0  ?Altered sleeping  0   ?Tired, decreased energy  3   ?Change in appetite  0   ?Feeling bad or failure about yourself   0   ?Trouble concentrating  0   ?Moving slowly or fidgety/restless  0   ?  Suicidal thoughts  0   ?PHQ-9 Score  3   ?  ? ? ? ?Social History  ? ?Tobacco Use  ?Smoking Status Former  ? Packs/day: 3.00  ? Years: 25.00  ? Pack years: 75.00  ? Types: Cigarettes  ? Quit date: 02/26/1994  ? Years since quitting: 27.9  ?Smokeless Tobacco Never  ?Tobacco Comments  ? quit 24 years   ? ?BP Readings from Last 3 Encounters:  ?01/07/22 130/84  ?09/24/21 130/76  ?08/29/21 124/76  ? ?Pulse Readings from Last 3 Encounters:  ?01/07/22 73  ?09/24/21 78  ?08/29/21 81  ? ?Wt Readings from Last 3 Encounters:  ?01/07/22 125 lb 3.2 oz (56.8 kg)  ?12/14/21 137 lb (62.1 kg)  ?09/24/21 137 lb (62.1 kg)  ? ?BMI Readings from Last 3 Encounters:  ?01/07/22 21.49 kg/m?  ?12/14/21 23.52 kg/m?  ?09/24/21 23.52 kg/m?  ? ? ?Assessment/Interventions: Review of patient past medical history, allergies, medications, health status, including review of consultants reports, laboratory and other test data, was performed as part of comprehensive evaluation and provision of chronic care management services.  ? ?SDOH:  (Social Determinants of Health) assessments and interventions performed: Yes ?SDOH Interventions   ? ?Flowsheet Row Most Recent Value  ?SDOH Interventions   ?Financial Strain Interventions Intervention Not Indicated  ?Housing Interventions Intervention Not Indicated  ? ?  ? ?SDOH Screenings  ? ?Alcohol Screen: Low Risk   ? Last Alcohol Screening Score (AUDIT): 0  ?Depression (PHQ2-9): Low Risk   ? PHQ-2 Score: 0  ?Financial Resource Strain: Low Risk   ? Difficulty of Paying Living Expenses: Not hard at all  ?Food Insecurity: No Food Insecurity  ? Worried About Charity fundraiser in the Last Year: Never true  ? Ran Out  of Food in the Last Year: Never true  ?Housing: Low Risk   ? Last Housing Risk Score: 0  ?Physical Activity: Inactive  ? Days of Exercise per Week: 0 days  ? Minutes of Exercise per Session: 0 min  ?Socia

## 2022-01-16 NOTE — Patient Instructions (Signed)
Hi Jeanne Tran and Jeanne Tran, ? ?It was great to get to meet you both over the telephone! Below is a summary of some of the topics we discussed.  ? ?I would go ahead and restart the calcium like we discussed. I also want you to try taking timed release melatonin 2 or 3 mg per night (no more than 5 mg) to see if that helps with your sleep as well. I will call you once I am able to talk everything over with Dr. Regis Tran. ? ?Please reach out to me if you have any questions or need anything before I reach back out! ? ?Best, ?Jeanne Tran ? ?Jeanne Tran, BCACP ?Clinical Pharmacist ?Therapist, music at [place] ?[phone] ? ? Visit Information ? ? Goals Addressed   ?None ?  ? ?Patient Care Plan: [place]  ?  ? ?Problem Identified: Problem: Hyperlipidemia, GERD, Osteopenia, and Osteoarthritis   ?  ? ?Long-Range Goal: Patient-Specific Goal   ?Start Date: 01/16/2022  ?Expected End Date: 01/17/2023  ?This Visit's Progress: On track  ?Priority: High  ?Note:   ?Current Barriers:  ?Unable to independently monitor therapeutic efficacy ?Unable to achieve control of sleep  ?Unable to self administer medications as prescribed ?Does not adhere to prescribed medication regimen ? ?Pharmacist Clinical Goal(s):  ?Patient will achieve adherence to monitoring guidelines and medication adherence to achieve therapeutic efficacy ?achieve control of sleep as evidenced by patient report ?adhere to plan to optimize therapeutic regimen for osteoporosis as evidenced by report of adherence to recommended medication management changes through collaboration with Tran and provider.  ? ?Interventions: ?1:1 collaboration with Jeanne Tran, Jeanne Brooking, MD regarding development and update of comprehensive plan of care as evidenced by provider attestation and co-signature ?Inter-disciplinary care team collaboration (see longitudinal plan of care) ?Comprehensive medication review performed; medication list updated in electronic medical  record ? ?Hyperlipidemia: (LDL goal < 100) ?-Controlled ?-Current treatment: ?Rosuvastatin 5 mg 1 tablet daily (not taking) - Appropriate, Query effective, Safe, Accessible ?-Medications previously tried: none  ?-Current dietary patterns: not eating much ?-Current exercise habits: not active with fracture ?-Educated on Cholesterol goals;  ?Benefits of statin for ASCVD risk reduction; ?-Counseled on diet and exercise extensively ?Collaborated with PCP to consider holding rosuvastatin. ? ?Anxiety/Insomnia (Goal: improve quality of sleep and minimize anxiety symptoms) ?-Uncontrolled ?-Current treatment: ?Ambien 10 mg 1/2 tablet at bedtime - Appropriate, Query effective, Query Safe, Accessible  ?Mirtazapine 15 mg 2 tablets at bedtime as needed for sleep - Appropriate, Query effective, Safe, Accessible ?Sertraline 100 mg 1 tablet daily - not taking it - Appropriate, Query effective, Safe, Accessible ?-Medications previously tried/failed: unknown ?-PHQ9: 0 ?-GAD7: n/a ?-Educated on Benefits of medication for symptom control ?-Recommended restarting sertraline at 1/2 tablet daily and then increasing to 1 full tablet in 2 weeks.  ? ?Osteoporosis / Osteopenia (Goal prevent fractures) ?-Uncontrolled ?-Last DEXA Scan: 02/13/21  ? T-Score femoral neck: -1.6 ? T-Score total hip: n/a ? T-Score lumbar spine: -0.7 ? T-Score forearm radius: n/a ? 10-year probability of major osteoporotic fracture: 18% ? 10-year probability of hip fracture: 5.6% ?-Patient is a candidate for pharmacologic treatment due to history of vertebral fracture and T-Score -1.0 to -2.5 and 10-year risk of hip fracture > 3% ?-Current treatment  ?No medications ?-Medications previously tried: alendronate (unknown)  ?-Recommend [phone] units of vitamin D daily. Recommend 1200 mg of calcium daily from dietary and supplemental sources. Recommend weight-bearing and muscle strengthening exercises for building and maintaining bone density. ?-Recommended restating  calcium 500 mg  daily with vitamin D supplement. ?Recommend discussion with PCP for osteoporosis treatment at next visit. ? ?GERD/Esophageal stricture (Goal: minimize symptoms) ?-Controlled ?-Current treatment  ?Dicyclomine 20 mg 1 tablet three times daily before meals and at bedtime - not taking often - Query Appropriate, Query effective, Safe, Accessible ?-Medications previously tried: famotidine (not needed)  ?-Recommended to continue as is. ? ?Health Maintenance ?-Vaccine gaps: COVID booster ?-Current therapy:  ?No medications ?-Educated on Cost vs benefit of each product must be carefully weighed by individual consumer ?-Patient is satisfied with current therapy and denies issues ?-Recommended to continue as is. ? ?Patient Goals/Self-Care Activities ?Patient will:  ?- take medications as prescribed as evidenced by patient report and record review ?focus on medication adherence by using a pill box or adherence packaging. ? ?Follow Up Plan: The care management team will reach out to the patient again over the next 14 days.  ? ?  ? ? ?Jeanne Tran was given information about Chronic Care Management services today including:  ?CCM service includes personalized support from designated clinical staff supervised by her physician, including individualized plan of care and coordination with other care providers ?24/7 contact phone numbers for assistance for urgent and routine care needs. ?Standard insurance, coinsurance, copays and deductibles apply for chronic care management only during months in which we provide at least 20 minutes of these services. Most insurances cover these services at 100%, however patients may be responsible for any copay, coinsurance and/or deductible if applicable. This service may help you avoid the need for more expensive face-to-face services. ?Only one practitioner may furnish and Tran the service in a calendar month. ?The patient may stop CCM services at any time (effective at the end of  the month) by phone call to the office staff. ? ?Patient agreed to services and verbal consent obtained.  ? ?The patient verbalized understanding of instructions, educational materials, and care plan provided today and agreed to receive a mailed copy of patient instructions, educational materials, and care plan.  ?The pharmacy team will reach out to the patient again over the next 14 days.  ? ?Viona Gilmore, RPH  ?

## 2022-01-17 NOTE — Telephone Encounter (Signed)
See result  note and advise referral  to NS placed  ?

## 2022-01-18 ENCOUNTER — Other Ambulatory Visit: Payer: Self-pay

## 2022-01-18 DIAGNOSIS — E038 Other specified hypothyroidism: Secondary | ICD-10-CM

## 2022-01-18 DIAGNOSIS — R7989 Other specified abnormal findings of blood chemistry: Secondary | ICD-10-CM

## 2022-01-18 NOTE — Progress Notes (Signed)
Lab orders are in

## 2022-01-18 NOTE — Progress Notes (Signed)
Yes we need to repeat   lab  please order  ? Tsh, free t4 in in about a month to decide if clinically important Closed compression fracture of body of l1 vertebra (hcc) ?History of left breast cancer elevated tsh ?Also sask if she has neurosurgery  appt  yet .

## 2022-01-22 ENCOUNTER — Telehealth: Payer: Self-pay | Admitting: Pharmacist

## 2022-01-22 NOTE — Telephone Encounter (Signed)
Spoke with PCP to follow up on CCM visit. Based on patient's frequent urination, PCP wants to do a urinalysis and urine culture to make sure there is no underlying infection prior to thinking about bladder control medications. Routing to team to place the order. Will call son to schedule a lab visit for pt to come in. ? ?Plan to also restart sertraline at 50 mg daily for 3-5 weeks and will follow up with PCP at that time to reassess dose and effectiveness.  ?

## 2022-01-23 ENCOUNTER — Other Ambulatory Visit: Payer: Self-pay

## 2022-01-23 DIAGNOSIS — R350 Frequency of micturition: Secondary | ICD-10-CM

## 2022-01-23 NOTE — Telephone Encounter (Signed)
Called patient's son to relay the message below. Scheduled for lab visit tomorrow for urine culture and analysis to rule out infection prior to discussing bladder medications.  ? ?Also recommended restarting sertraline at 1/2 tablet daily and will follow up with PCP in 4 weeks to re-assess. ? ?Per discussion with PCP, plan for genesight testing to see if alternative sleep/mood medications would be helpful. ?

## 2022-01-23 NOTE — Telephone Encounter (Signed)
Lab orders have been placed

## 2022-01-24 ENCOUNTER — Other Ambulatory Visit (INDEPENDENT_AMBULATORY_CARE_PROVIDER_SITE_OTHER): Payer: Medicare Other

## 2022-01-24 DIAGNOSIS — R350 Frequency of micturition: Secondary | ICD-10-CM

## 2022-01-24 LAB — URINALYSIS
Bilirubin Urine: NEGATIVE
Hgb urine dipstick: NEGATIVE
Ketones, ur: NEGATIVE
Leukocytes,Ua: NEGATIVE
Nitrite: NEGATIVE
Specific Gravity, Urine: 1.015 (ref 1.000–1.030)
Total Protein, Urine: NEGATIVE
Urine Glucose: NEGATIVE
Urobilinogen, UA: 0.2 (ref 0.0–1.0)
pH: 6 (ref 5.0–8.0)

## 2022-01-25 LAB — URINE CULTURE
MICRO NUMBER:: 13383318
Result:: NO GROWTH
SPECIMEN QUALITY:: ADEQUATE

## 2022-01-28 DIAGNOSIS — S32010A Wedge compression fracture of first lumbar vertebra, initial encounter for closed fracture: Secondary | ICD-10-CM | POA: Diagnosis not present

## 2022-01-28 NOTE — Progress Notes (Signed)
Urine is clear  No UTI that could be causing frequent nocturia .

## 2022-02-13 DIAGNOSIS — E785 Hyperlipidemia, unspecified: Secondary | ICD-10-CM

## 2022-02-13 DIAGNOSIS — M8580 Other specified disorders of bone density and structure, unspecified site: Secondary | ICD-10-CM

## 2022-03-05 ENCOUNTER — Ambulatory Visit: Payer: Medicare Other | Admitting: Internal Medicine

## 2022-03-12 DIAGNOSIS — Z23 Encounter for immunization: Secondary | ICD-10-CM | POA: Diagnosis not present

## 2022-03-18 ENCOUNTER — Ambulatory Visit (INDEPENDENT_AMBULATORY_CARE_PROVIDER_SITE_OTHER): Payer: Medicare Other | Admitting: Internal Medicine

## 2022-03-18 DIAGNOSIS — G4700 Insomnia, unspecified: Secondary | ICD-10-CM

## 2022-03-18 DIAGNOSIS — E559 Vitamin D deficiency, unspecified: Secondary | ICD-10-CM

## 2022-03-18 DIAGNOSIS — Z8781 Personal history of (healed) traumatic fracture: Secondary | ICD-10-CM | POA: Diagnosis not present

## 2022-03-18 DIAGNOSIS — E785 Hyperlipidemia, unspecified: Secondary | ICD-10-CM

## 2022-03-18 DIAGNOSIS — R7989 Other specified abnormal findings of blood chemistry: Secondary | ICD-10-CM | POA: Diagnosis not present

## 2022-03-18 DIAGNOSIS — Z79899 Other long term (current) drug therapy: Secondary | ICD-10-CM | POA: Diagnosis not present

## 2022-03-18 DIAGNOSIS — F4322 Adjustment disorder with anxiety: Secondary | ICD-10-CM | POA: Diagnosis not present

## 2022-03-18 DIAGNOSIS — N184 Chronic kidney disease, stage 4 (severe): Secondary | ICD-10-CM

## 2022-03-18 DIAGNOSIS — E538 Deficiency of other specified B group vitamins: Secondary | ICD-10-CM | POA: Diagnosis not present

## 2022-03-18 MED ORDER — ZOLPIDEM TARTRATE 5 MG PO TABS: 5.0000 mg | ORAL_TABLET | Freq: Every evening | ORAL | 1 refills | Status: DC | PRN

## 2022-03-18 NOTE — Progress Notes (Addendum)
   Virtual Visit via Telephone Note  I connected with@ on 03/18/22 at  2:30 PM EDT by telephone and verified that I am speaking with the correct person using two identifiers.   I discussed the limitations, risks, security and privacy concerns of performing an evaluation and management service by telephone and the limited availability of in person appointments. tThere may be a patient responsible charge related to this service. The patient expressed understanding and agreed to proceed.  Location patient: home Location provider: work or home office Participants present for the call: patient, provider Patient did not have a visit in the prior 7 days to address this/these issue(s).   History of Present Illness: Jeanne Tran    Observations/Objective: Patient sounds personable and well on the phone. I do not appreciate any SOB. Speech and thought processing are grossly intact. Patient reported vitals: Lab Results  Component Value Date   WBC 6.6 01/07/2022   HGB 11.9 (L) 01/07/2022   HCT 36.5 01/07/2022   PLT 264.0 01/07/2022   GLUCOSE 88 01/07/2022   CHOL 182 06/13/2021   TRIG 116.0 06/13/2021   HDL 72.20 06/13/2021   LDLCALC 86 06/13/2021   ALT 12 01/07/2022   AST 19 01/07/2022   NA 139 01/07/2022   K 4.5 01/07/2022   CL 104 01/07/2022   CREATININE 1.83 (H) 01/07/2022   BUN 33 (H) 01/07/2022   CO2 28 01/07/2022   TSH 6.98 (H) 01/07/2022   INR 1.00 06/29/2014   HGBA1C 6.3 01/22/2019   MICROALBUR <0.7 06/13/2021    Assessment and Plan:  Glad improved   so far    Stay off the dicyclomine   Decide on restarting cretor    Assess for fracture prevention therapy.  Follow Up Instructions:  @  Assess  lipid med , sleep  gene  and bone building therapy.  [id number] 5-10 [id number] 11-20 [id number] 21-30 I did not refer this patient for an OV in the next 24 hours for this/these issue(s).  I discussed the assessment and treatment plan with the patient. The patient was provided an  opportunity to ask questions and answered. The patient agreed with the plan and demonstrated an understanding of the instructions.   The patient was advised to call back or seek an in-person evaluation if the symptoms worsen or if the condition fails to improve as anticipated.  I provided *** minutes of non-face-to-face time during this encounter. Return for labs appt   then plan fu    .  Shanon Ace, MD

## 2022-03-19 MED ORDER — SERTRALINE HCL 100 MG PO TABS: 50.0000 mg | ORAL_TABLET | Freq: Every day | ORAL | 1 refills | Status: DC

## 2022-04-01 ENCOUNTER — Other Ambulatory Visit (INDEPENDENT_AMBULATORY_CARE_PROVIDER_SITE_OTHER): Payer: Medicare Other

## 2022-04-01 DIAGNOSIS — Z79899 Other long term (current) drug therapy: Secondary | ICD-10-CM | POA: Diagnosis not present

## 2022-04-01 DIAGNOSIS — E559 Vitamin D deficiency, unspecified: Secondary | ICD-10-CM

## 2022-04-01 DIAGNOSIS — G4700 Insomnia, unspecified: Secondary | ICD-10-CM

## 2022-04-01 DIAGNOSIS — E785 Hyperlipidemia, unspecified: Secondary | ICD-10-CM | POA: Diagnosis not present

## 2022-04-01 DIAGNOSIS — R7989 Other specified abnormal findings of blood chemistry: Secondary | ICD-10-CM | POA: Diagnosis not present

## 2022-04-01 DIAGNOSIS — E538 Deficiency of other specified B group vitamins: Secondary | ICD-10-CM | POA: Diagnosis not present

## 2022-04-01 LAB — LIPID PANEL
Cholesterol: 216 mg/dL — ABNORMAL HIGH (ref 0–200)
HDL: 63 mg/dL (ref >39.00)
LDL Cholesterol: 124 mg/dL — ABNORMAL HIGH (ref 0–99)
NonHDL: 152.73
Total CHOL/HDL Ratio: 3
Triglycerides: 145 mg/dL (ref 0.0–149.0)
VLDL: 29 mg/dL (ref 0.0–40.0)

## 2022-04-01 LAB — CBC WITH DIFFERENTIAL/PLATELET
Basophils Absolute: 0.1 10*3/uL (ref 0.0–0.1)
Basophils Relative: 1.2 % (ref 0.0–3.0)
Eosinophils Absolute: 0.4 10*3/uL (ref 0.0–0.7)
Eosinophils Relative: 6.6 % — ABNORMAL HIGH (ref 0.0–5.0)
HCT: 37.2 % (ref 36.0–46.0)
Hemoglobin: 12.4 g/dL (ref 12.0–15.0)
Lymphocytes Relative: 28.6 % (ref 12.0–46.0)
Lymphs Abs: 1.7 10*3/uL (ref 0.7–4.0)
MCHC: 33.3 g/dL (ref 30.0–36.0)
MCV: 95 fl (ref 78.0–100.0)
Monocytes Absolute: 0.7 10*3/uL (ref 0.1–1.0)
Monocytes Relative: 11.3 % (ref 3.0–12.0)
Neutro Abs: 3.2 10*3/uL (ref 1.4–7.7)
Neutrophils Relative %: 52.3 % (ref 43.0–77.0)
Platelets: 211 10*3/uL (ref 150.0–400.0)
RBC: 3.92 Mil/uL (ref 3.87–5.11)
RDW: 14 % (ref 11.5–15.5)
WBC: 6.1 10*3/uL (ref 4.0–10.5)

## 2022-04-01 LAB — BASIC METABOLIC PANEL
BUN: 42 mg/dL — ABNORMAL HIGH (ref 6–23)
CO2: 30 mEq/L (ref 19–32)
Calcium: 9.9 mg/dL (ref 8.4–10.5)
Chloride: 103 mEq/L (ref 96–112)
Creatinine, Ser: 1.82 mg/dL — ABNORMAL HIGH (ref 0.40–1.20)
GFR: 24.87 mL/min — ABNORMAL LOW (ref >60.00)
Glucose, Bld: 90 mg/dL (ref 70–99)
Potassium: 4.7 mEq/L (ref 3.5–5.1)
Sodium: 141 mEq/L (ref 135–145)

## 2022-04-01 LAB — T4, FREE: Free T4: 0.68 ng/dL (ref 0.60–1.60)

## 2022-04-01 LAB — TSH: TSH: 8.45 u[IU]/mL — ABNORMAL HIGH (ref 0.35–5.50)

## 2022-04-01 LAB — VITAMIN B12: Vitamin B-12: >1504 pg/mL — ABNORMAL HIGH (ref 211–911)

## 2022-04-05 ENCOUNTER — Other Ambulatory Visit: Payer: Self-pay | Admitting: Internal Medicine

## 2022-04-08 ENCOUNTER — Telehealth: Payer: Self-pay | Admitting: Pharmacist

## 2022-04-08 NOTE — Telephone Encounter (Signed)
Last Ov 03/18/22 Filled 12/04/20 Is it ok to refill?

## 2022-04-08 NOTE — Telephone Encounter (Signed)
Pt son is call back on the refill for pt, he stated she is out and need it today.

## 2022-04-08 NOTE — Telephone Encounter (Signed)
Patient's son called to discuss the patient's use of Ambien. They had agreed to decrease the dose to 5 mg but now the patient feels as though this is not helping as much as the 10 mg. Patient wants the option to take the full 10 mg on any given night. Patient's son is still managing her medications so she would not be getting more than 10 mg in a night. Will route to PCP.

## 2022-04-09 ENCOUNTER — Other Ambulatory Visit: Payer: Self-pay | Admitting: Internal Medicine

## 2022-04-09 NOTE — Telephone Encounter (Signed)
Pt's son calling checking on progress of this refill. States his mother ran out of her medication and she needs it for sleep.

## 2022-04-11 NOTE — Telephone Encounter (Signed)
I just responded to this in the patient call but she is technically taking both right now. Not sure that she needs to be though just based on her stopping and restarting and still having sleep trouble.

## 2022-04-15 NOTE — Progress Notes (Signed)
Thyroid is off  more than last time  I advise we add   small amount of daily thyroid medication . And follow  Maybe will help feel better .  Please send in synthroid /levoithyroxiine  50 mcg to take  a day  disp 90 refill x 1 Arrange check tsh in 2-3 months   Rest of labs b12 is now  high , anemia is better . Kidney function is about the same  ( Also see  the notes about ambien and remeron )

## 2022-04-15 NOTE — Telephone Encounter (Signed)
I would like then to stop the Remeron and  change the ambient to 5-10 mg per night  to make things simpler . And if that doesn't work  ... Let me know how much more ambien to send in

## 2022-04-16 NOTE — Telephone Encounter (Signed)
Called patient's son back about the Ambien and mirtazapine and the plan is to stop the mirtazapine and only take 1-2 tablets of the Ambien per night. Patient's son requested a refill given current prescription only says 1 tablet per day.  Patient's son also inquired about lab work results and discussed with him. Educated on starting levothyroxine and patient's son will discuss with patient about timing (either first thing in morning prior to eating or at bedtime with a few hours in between eating and taking it). Patient's son verbalized his understanding and will follow up in a couple of weeks on sleep.

## 2022-04-17 ENCOUNTER — Telehealth: Payer: Self-pay

## 2022-04-17 DIAGNOSIS — E038 Other specified hypothyroidism: Secondary | ICD-10-CM

## 2022-04-17 DIAGNOSIS — R7989 Other specified abnormal findings of blood chemistry: Secondary | ICD-10-CM

## 2022-04-17 MED ORDER — LEVOTHYROXINE SODIUM 50 MCG PO TABS: 50.0000 ug | ORAL_TABLET | Freq: Every day | ORAL | 1 refills | Status: DC

## 2022-04-17 MED ORDER — ZOLPIDEM TARTRATE 5 MG PO TABS: 5.0000 mg | ORAL_TABLET | Freq: Every evening | ORAL | 1 refills | Status: DC | PRN

## 2022-04-17 NOTE — Telephone Encounter (Signed)
Lab result was reports to patient's son by M. Pryor.  See note.   Lab ordered placed.

## 2022-04-22 ENCOUNTER — Telehealth: Payer: Self-pay | Admitting: Pharmacist

## 2022-04-22 NOTE — Telephone Encounter (Signed)
Patient's son called to inquire about dicyclomine as he found an old bottle of it for the patient. He was unsure if she was having IBS related symptoms so he will discuss it with the patient and then call back about needing a refill or not. Patient is not currently taking it.   Patient's son also reported there was an issue with the pharmacy and getting her Ambien and they only filled it for 30 tablets. He was unsure why and said the pharmacy told them it could only be filled for a 30 days supply. Called the pharmacy to clarify and was informed that her insurance only paid for 30 tablets for a 30 days supply, which is why only 30 were filled. Will route to PCP to request prescription for 10 mg tablets and son will have to split in half to limit use.

## 2022-04-26 MED ORDER — ZOLPIDEM TARTRATE 10 MG PO TABS: 10.0000 mg | ORAL_TABLET | Freq: Every evening | ORAL | 1 refills | Status: DC | PRN

## 2022-04-26 NOTE — Telephone Encounter (Signed)
I  ordered fill later for 10 mg instead of 5 mg at night .  Will send in when time ordered

## 2022-05-14 NOTE — Telephone Encounter (Signed)
Called patient's son back. He inquired about the levothyroxine causing dizziness as that is what the patient reported. Encouraged taking it with plenty of water and more likely dizziness coming from mirtazapine or zolpidem. Patient is taking 1 tablet of 5 mg zolpidem and 1 tablet of 15 mg mirtazapine now.

## 2022-05-20 MED ORDER — ZOLPIDEM TARTRATE 10 MG PO TABS: 10.0000 mg | ORAL_TABLET | Freq: Every evening | ORAL | 1 refills | Status: DC | PRN

## 2022-05-20 NOTE — Addendum Note (Signed)
Addended byShanon Ace K on: 05/20/2022 12:40 PM   Modules accepted: Orders

## 2022-06-11 ENCOUNTER — Ambulatory Visit (INDEPENDENT_AMBULATORY_CARE_PROVIDER_SITE_OTHER): Payer: Medicare Other | Admitting: Family Medicine

## 2022-06-11 ENCOUNTER — Encounter: Payer: Self-pay | Admitting: Family Medicine

## 2022-06-11 VITALS — BP 118/78 | HR 75 | Temp 97.9°F | Wt 130.0 lb

## 2022-06-11 DIAGNOSIS — K6289 Other specified diseases of anus and rectum: Secondary | ICD-10-CM

## 2022-06-11 DIAGNOSIS — Z23 Encounter for immunization: Secondary | ICD-10-CM | POA: Diagnosis not present

## 2022-06-11 NOTE — Addendum Note (Signed)
Addended by: Wyvonne Lenz on: 06/11/2022 03:43 PM   Modules accepted: Orders

## 2022-06-11 NOTE — Progress Notes (Signed)
   Subjective:    Patient ID: Jeanne Tran, female    DOB: 1934-02-18, 88 y.o.   MRN: [id number]  HPI Here for 3 weeks of leakage of stool from the anus. She has never had this before. No recent changes in diet or in medications. She has been having about 3 normal stools a day (her baseline) but after she is finished passing the stool she will often have a small amount of soft stool leak out the anus. This lasts about 30 minutes and then stops. There is no pain or cramps, no fever. She denies any muscle weakness anywhere else in her body. She is urinating normally.    Review of Systems  Constitutional: Negative.   Respiratory: Negative.    Cardiovascular: Negative.   Gastrointestinal:  Negative for abdominal distention, abdominal pain, anal bleeding, blood in stool, constipation, diarrhea, nausea, rectal pain and vomiting.  Genitourinary: Negative.        Objective:   Physical Exam Constitutional:      Appearance: Normal appearance.  Cardiovascular:     Rate and Rhythm: Normal rate and regular rhythm.     Pulses: Normal pulses.     Heart sounds: Normal heart sounds.  Pulmonary:     Effort: Pulmonary effort is normal.     Breath sounds: Normal breath sounds.  Abdominal:     General: Abdomen is flat. Bowel sounds are normal. There is no distension.     Palpations: Abdomen is soft. There is no mass.     Tenderness: There is no abdominal tenderness. There is no guarding or rebound.     Hernia: No hernia is present.  Genitourinary:    Comments: The perineal area is clear. The rectum is not tender, no masses inside. No impaction. The muscle tone of the anal sphincter is somewhat weak however.  Neurological:     Mental Status: She is alert.           Assessment & Plan:  Anal incompetence. No signs of cauda equina syndrome. She will continue to wear protective underpants. Refer to GI to evaluate.  Alysia Penna, MD

## 2022-06-14 ENCOUNTER — Other Ambulatory Visit: Payer: Medicare Other

## 2022-06-14 DIAGNOSIS — E038 Other specified hypothyroidism: Secondary | ICD-10-CM

## 2022-06-14 LAB — TSH: TSH: 1.21 u[IU]/mL (ref 0.35–5.50)

## 2022-06-29 NOTE — Progress Notes (Signed)
TSH   thyroid level is now in normal range . Continue same medication  50 mcg per day levothyroxine  and will follow .

## 2022-07-15 ENCOUNTER — Ambulatory Visit (INDEPENDENT_AMBULATORY_CARE_PROVIDER_SITE_OTHER): Payer: Medicare Other | Admitting: Nurse Practitioner

## 2022-07-15 ENCOUNTER — Encounter: Payer: Self-pay | Admitting: Nurse Practitioner

## 2022-07-15 VITALS — BP 120/68 | HR 80 | Ht 63.0 in | Wt 128.0 lb

## 2022-07-15 DIAGNOSIS — R159 Full incontinence of feces: Secondary | ICD-10-CM

## 2022-07-15 NOTE — Progress Notes (Signed)
07/15/2022 Jeanne Tran [id number] [birth year]   CHIEF COMPLAINT: Diarrhea and fecal leakage   HISTORY OF PRESENT ILLNESS: Jeanne Tran is an 88 year old female with a past medical history of hyperlipidemia, breast cancer, renal insufficiency, GERD and diverticulosis. She presents to our office today as referred by Dr. Alysia Penna due to anal sphincter incompetence. She complains of having fecal leakage for the past year with associated loose stools. No recent antibiotic use. She started taking Imodium a few weeks ago and her diarrhea has mostly abated. Her last episode of fecal leakage was about 10 days ago.  She is passing a soft formed brown bowel movement once daily since starting Imodium.  No abdominal or rectal pain. She reported losing 25lbs over the past 6 to 12 months which has stabilized.  Weight 125 pounds April 2023.  Today's weight 128 pounds.  Appetite is good. She lives by herself and her 2 sons live reasonably nearby.  She underwent a colonoscopy by Dr. Maurene Capes 05/25/2013 which showed severe sigmoid diverticulosis, no evidence of microscopic colitis or IBD.  No other complaints at this time.       Latest Ref Rng & Units 04/01/2022   10:55 AM 01/07/2022    3:27 PM 06/13/2021    2:51 PM  CBC  WBC 4.0 - 10.5 K/uL 6.1  6.6  6.8   Hemoglobin 12.0 - 15.0 g/dL 12.4  11.9  13.0   Hematocrit 36.0 - 46.0 % 37.2  36.5  39.0   Platelets 150.0 - 400.0 K/uL 211.0  264.0  205.0        Latest Ref Rng & Units 04/01/2022   10:55 AM 01/07/2022    3:27 PM 09/24/2021    4:19 PM  CMP  Glucose 70 - 99 mg/dL 90  88  98   BUN 6 - 23 mg/dL 42  33  34   Creatinine 0.40 - 1.20 mg/dL 1.82  1.83  1.73   Sodium 135 - 145 mEq/L 141  139  141   Potassium 3.5 - 5.1 mEq/L 4.7  4.5  5.1   Chloride 96 - 112 mEq/L 103  104  104   CO2 19 - 32 mEq/L '30  28  29   '$ Calcium 8.4 - 10.5 mg/dL 9.9  10.1  10.4    10.2   Total Protein 6.0 - 8.3 g/dL  6.8    Total Bilirubin 0.2 - 1.2 mg/dL  0.4     Alkaline Phos 39 - 117 U/L  108    AST 0 - 37 U/L  19    ALT 0 - 35 U/L  12      Colonoscopy 05/25/2013 by Dr. Olevia Perches: Severe sigmoid diverticulosis  Surgical [P], random site, biopsy - BENIGN COLONIC MUCOSA. - NO MICROSCOPIC COLITIS, ACTIVE INFLAMMATION, GRANULOMAS, ADENOMATOUS OR MALIGNANT CHANGES IDENTIFIED.  Colonoscopy 03/12/2005: Diverticulosis to the cecum to sigmoid colon Tortuous colon   Past Medical History:  Diagnosis Date   Breast cancer ([place])    hx of left with radiation and lumpectomy   Diverticulosis    GERD (gastroesophageal reflux disease)    History of colonic polyps    History of ERCP    and sphincterotomy for abnormal lfts and dilated biliary ducts 5/05   Hyperlipidemia    Migraine headache    Pulmonary nodule    RLS (restless legs syndrome)    Wears contact lenses    Past Surgical History:  Procedure Laterality Date   ABDOMINAL  HYSTERECTOMY     bso   APPENDECTOMY     BREAST LUMPECTOMY  1995   lt lumpsnbx   BREAST LUMPECTOMY WITH RADIOACTIVE SEED LOCALIZATION Left 07/01/2014   Procedure: LEFT BREAST LUMPECTOMY WITH RADIOACTIVE SEED LOCALIZATION;  Surgeon: [name], MD;  Location: [facility];  Service: General;  Laterality: Left;   CERVICAL SPINE SURGERY  3/03   CHOLECYSTECTOMY  2012   Complete Hysterectomy     CYSTOCELE REPAIR     ERCP     for abnormal lfts and dilated biliary ducts 5/05   gallbladder duct open  2006   HEMORRHOID SURGERY     HYSTEROSCOPY     LYMPH NODE BIOPSY     Orthoscopic Rt Knee     SPHINCTEROTOMY     for abnormal lfts and dilated biliary ducts 5/05   tonsillectomy     Social History: She is widowed.  Retired.  She lives by herself. She has 2 sons.  She quit smoking cigarettes 24 years.  No drug use.  Family History: Maternal grandfather had esophageal cancer.  Son with mitral valve prolapse.  No known family history of colorectal cancer.   Allergies  Allergen Reactions   Codeine Other (See  Comments)    REACTION: Intolerance w/ pain meds not cough syrup. Codeine makes her bounce off the walls. falling REACTION: Intolerance w/ pain meds not cough syrup. Codeine makes her bounce off the walls.   Aspirin Other (See Comments)    GI issues GI issues      Outpatient Encounter Medications as of 07/15/2022  Medication Sig   fluocinonide-emollient (LIDEX-E) 0.05 % cream Apply 1 application topically 2 (two) times daily. As  Directed. (Patient taking differently: Apply 1 application  topically 2 (two) times daily. As  Directed.)   levothyroxine (SYNTHROID) 50 MCG tablet Take 1 tablet (50 mcg total) by mouth daily.   mirtazapine (REMERON) 15 MG tablet TAKE 1 TO 2 TABLETS BY MOUTH AT BEDTIME FOR SLEEP   rosuvastatin (CRESTOR) 5 MG tablet TAKE 1 TABLET(5 MG) BY MOUTH DAILY   sertraline (ZOLOFT) 100 MG tablet Take 0.5 tablets (50 mg total) by mouth daily.   zolpidem (AMBIEN) 10 MG tablet Take 1 tablet (10 mg total) by mouth at bedtime as needed for sleep. Limit regular use   No facility-administered encounter medications on file as of 07/15/2022.    REVIEW OF SYSTEMS:  Gen: Denies fever, sweats or chills. No weight loss.  CV: Denies chest pain, palpitations or edema. Resp: Denies cough, shortness of breath of hemoptysis.  GI: See HPI.  GU : + Excessive urination.  MS: + Arthritis. Derm: Denies rash, itchiness, skin lesions or unhealing ulcers. Psych: Denies depression, anxiety, memory loss, suicidal ideation and confusion. Heme: Denies bruising, easy bleeding. Neuro:  Denies headaches, dizziness or paresthesias. Endo:  Denies any problems with DM, thyroid or adrenal function.  PHYSICAL EXAM: There were no vitals taken for this visit. BP 120/68   Pulse 80   Ht '5\' 3"'$  (1.6 m)   Wt 128 lb (58.1 kg)   SpO2 90%   BMI 22.67 kg/m   Wt Readings from Last 3 Encounters:  07/15/22 128 lb (58.1 kg)  06/11/22 130 lb (59 kg)  01/07/22 125 lb 3.2 oz (56.8 kg)    General: Well  developed ... in no acute distress. Head: Normocephalic and atraumatic. Eyes:  Sclerae non-icteric, conjunctive pink. Ears: Normal auditory acuity. Mouth: Dentition intact. No ulcers or lesions.  Neck: Supple, no lymphadenopathy  or thyromegaly.  Lungs: Clear bilaterally to auscultation without wheezes, crackles or rhonchi. Heart: Regular rate and rhythm. No murmur, rub or gallop appreciated.  Abdomen: Soft, nontender, non distended. No masses. No hepatosplenomegaly. Normoactive bowel sounds x 4 quadrants.  Rectal: No external hemorrhoids.  Internal hemorrhoids without prolapse.  Significantly diminished anal sphincter tone.  No anorectal mass.  No stool in the rectal vault. CMA Loysie present during exam.  Musculoskeletal: Symmetrical with no gross deformities. Skin: Warm and dry. No rash or lesions on visible extremities. Extremities: No edema. Neurological: Alert oriented x 4, no focal deficits.  Psychological:  Alert and cooperative. Normal mood and affect.  ASSESSMENT AND PLAN:  49) 88 year old with diarrhea with associated fecal leakage which has significantly improved after initiating Imodium 1 tab p.o. daily for the past few weeks. -Patient declined rectal physical therapy -Patient instructed to continue Imodium 1 tab daily as tolerated, stop if no bowel movement in 24 hours -Patient to contact her office if diarrhea recurs.  Recommend checking fecal pancreatic elastase and GI pathogen panel if diarrhea recurs. -Benefiber 1 tablespoon daily to bulk up stool as tolerated, stop if abdominal gas bloat symptoms develop or otherwise if not tolerated -Repeat weight with PCP in 4 to 6 weeks -Consider scheduling CTAP if weight loss recurs -Follow-up as needed    CC:  Laurey Morale, MD

## 2022-07-15 NOTE — Progress Notes (Signed)
Reviewed and agree with management plans. ? ?Celica Kotowski L. Vernell Back, MD, MPH  ?

## 2022-07-15 NOTE — Patient Instructions (Addendum)
Ok to take Imodium one tab daily to keep stools firm and to reduce stool leakage. Stop Imodium if no bowel movement in 24 hours   Benefiber 1 tablespoon once daily to bulk up stool if tolerated   Follow up as needed   Contact our office if diarrhea recurs or fecal leakage persists   It was a pleasure to see you today!  Thank you for trusting me with your gastrointestinal care!

## 2022-09-25 DIAGNOSIS — Z23 Encounter for immunization: Secondary | ICD-10-CM | POA: Diagnosis not present

## 2022-09-26 ENCOUNTER — Other Ambulatory Visit: Payer: Self-pay | Admitting: Internal Medicine

## 2022-10-12 ENCOUNTER — Other Ambulatory Visit: Payer: Self-pay | Admitting: Internal Medicine

## 2022-10-13 ENCOUNTER — Other Ambulatory Visit: Payer: Self-pay | Admitting: Family

## 2022-10-13 MED ORDER — MIRTAZAPINE 15 MG PO TABS: ORAL_TABLET | ORAL | 0 refills | Status: DC

## 2022-10-13 MED ORDER — ZOLPIDEM TARTRATE 10 MG PO TABS: ORAL_TABLET | ORAL | 0 refills | Status: DC

## 2022-10-21 ENCOUNTER — Telehealth: Payer: Self-pay

## 2022-10-21 NOTE — Progress Notes (Unsigned)
Care Management & Coordination Services Pharmacy Team  Reason for Encounter: General adherence update  SCHEDULE FOLLOW UP   Contacted patient for general health update and medication adherence call.  {US HC Outreach:28874}  What concerns do you have about your medications?  The patient {denies/reports:25180} side effects with their medications.   How often do you forget or accidentally miss a dose? {missed doses:25554}  Do you use a pillbox? {yes/no:20286}  Are you having any problems getting your medications from your pharmacy? {yes/no:20286}  Has the cost of your medications been a concern? {yes/no:20286} If yes, what medication and is patient assistance available or has it been applied for?  Since last visit with PharmD, {no/thefollowing:25210} interventions have been made.   The patient has not had an ED visit since last contact.   The patient {denies/reports:25180} problems with their health.   Patient {denies/reports:25180} concerns or questions for ***, PharmD at this time.   Counseled patient on: {GENERALCOUNSELING:28686}   Chart Updates:  Recent office visits:  06/11/2022 Alysia Penna MD - Patient was seen for Anal sphincter incompetence and an additional concern. No medication changes.   Recent consult visits:  07/15/2022 Carl Best NP (GI) - Patient was seen for Frequent fecal incontinence. No medication changes.   Hospital visits:  None  Medications: Outpatient Encounter Medications as of 10/21/2022  Medication Sig   fluocinonide-emollient (LIDEX-E) 0.05 % cream Apply 1 application topically 2 (two) times daily. As  Directed. (Patient not taking: Reported on 07/15/2022)   levothyroxine (SYNTHROID) 50 MCG tablet TAKE 1 TABLET(50 MCG) BY MOUTH DAILY   mirtazapine (REMERON) 15 MG tablet TAKE 1 TO 2 TABLETS BY MOUTH AT BEDTIME FOR SLEEP   rosuvastatin (CRESTOR) 5 MG tablet TAKE 1 TABLET(5 MG) BY MOUTH DAILY   sertraline (ZOLOFT) 100 MG tablet Take  0.5 tablets (50 mg total) by mouth daily.   vitamin B-12 (CYANOCOBALAMIN) 100 MCG tablet    zolpidem (AMBIEN) 10 MG tablet TAKE 1 TABLET BY MOUTH AT BEDTIME AS NEEDED FOR SLEEP. LIMIT REGULAR USE   No facility-administered encounter medications on file as of 10/21/2022.  Fill History:   Dispensed Days Supply Quantity Provider Pharmacy  LEVOTHYROXINE 0.'05MG'$  (50MCG) TAB 09/27/2022 90 90 each      Dispensed Days Supply Quantity Provider Pharmacy  MIRTAZAPINE '15MG'$  TABLETS 04/10/2022 90 180 each      Dispensed Days Supply Quantity Provider Pharmacy  ROSUVASTATIN '5MG'$  TABLETS 06/23/2021 90 90 each      Dispensed Days Supply Quantity Provider Pharmacy  SERTRALINE '100MG'$  TABLETS 06/13/2022 90 90 each      Dispensed Days Supply Quantity Provider Pharmacy  ZOLPIDEM '10MG'$  TABLETS 08/21/2022 30 30 each      Recent vitals BP Readings from Last 3 Encounters:  07/15/22 120/68  06/11/22 118/78  01/07/22 130/84   Pulse Readings from Last 3 Encounters:  07/15/22 80  06/11/22 75  01/07/22 73   Wt Readings from Last 3 Encounters:  07/15/22 128 lb (58.1 kg)  06/11/22 130 lb (59 kg)  01/07/22 125 lb 3.2 oz (56.8 kg)   BMI Readings from Last 3 Encounters:  07/15/22 22.67 kg/m  06/11/22 22.31 kg/m  01/07/22 21.49 kg/m    Recent lab results    Component Value Date/Time   NA 141 04/01/2022 1055   NA 141 04/17/2017 1504   K 4.7 04/01/2022 1055   K 5.0 04/17/2017 1504   CL 103 04/01/2022 1055   CO2 30 04/01/2022 1055   CO2 28 04/17/2017 1504   GLUCOSE 90  04/01/2022 1055   GLUCOSE 91 04/17/2017 1504   BUN 42 (H) 04/01/2022 1055   BUN 27.3 (H) 04/17/2017 1504   CREATININE 1.82 (H) 04/01/2022 1055   CREATININE 1.74 (H) 07/05/2020 1256   CREATININE 1.6 (H) 04/17/2017 1504   CALCIUM 9.9 04/01/2022 1055   CALCIUM 10.1 04/17/2017 1504    Lab Results  Component Value Date   CREATININE 1.82 (H) 04/01/2022   GFR 24.87 (L) 04/01/2022   EGFR 31 (L) 04/17/2017   GFRNONAA 26 (L) 07/05/2020    GFRAA 31 (L) 07/05/2020   Lab Results  Component Value Date/Time   HGBA1C 6.3 01/22/2019 10:35 AM   HGBA1C 6.2 02/25/2018 01:13 PM   MICROALBUR <0.7 06/13/2021 02:51 PM    Lab Results  Component Value Date   CHOL 216 (H) 04/01/2022   HDL 63.00 04/01/2022   LDLCALC 124 (H) 04/01/2022   TRIG 145.0 04/01/2022   CHOLHDL 3 04/01/2022    Care Gaps: AWV - completed 12/14/2021, scheduled 12/17/2022 Covid - overdue Mammogram - overdue   Star Rating Drugs:  Rosuvastatin 5 mg - last filled 06/23/2021 90 DS at [facility] (patient is not taking)  [place] Pharmacist Assistant [phone]

## 2022-10-29 ENCOUNTER — Other Ambulatory Visit: Payer: Self-pay

## 2022-10-29 MED ORDER — MIRTAZAPINE 15 MG PO TABS: ORAL_TABLET | ORAL | 0 refills | Status: DC

## 2022-10-29 NOTE — Telephone Encounter (Signed)
Si I sent in 135 for 3 mos    or 1.5 tabs per day x 90 days = 135

## 2022-10-29 NOTE — Telephone Encounter (Signed)
Received a fax from [place] on Mirtazapine.   A message for prescriber.  " Insurance only covers a maximum of 1.5 tablets per day- either need to do a prior auth for 2 per day or increase dose."  Lov with Dr. Regis Bill on 03/18/2022.  Form placed in red folder.   Please advise.

## 2022-11-05 ENCOUNTER — Other Ambulatory Visit (HOSPITAL_COMMUNITY): Payer: Self-pay

## 2022-11-07 ENCOUNTER — Other Ambulatory Visit (HOSPITAL_COMMUNITY): Payer: Self-pay

## 2022-11-12 ENCOUNTER — Telehealth: Payer: Self-pay

## 2022-11-12 ENCOUNTER — Other Ambulatory Visit (HOSPITAL_COMMUNITY): Payer: Self-pay

## 2022-11-12 NOTE — Telephone Encounter (Signed)
Pharmacy Patient Advocate Encounter   Received notification that prior authorization for Mirtazapine '15mg'$  tabs is required/requested.   PA submitted on 11/12/22 to (ins) [employer] via CoverMyMeds Key [id number] Status is pending

## 2022-11-13 NOTE — Telephone Encounter (Signed)
PA Approved- scanned to [employer]-

## 2022-11-20 ENCOUNTER — Encounter: Payer: Self-pay | Admitting: Internal Medicine

## 2022-11-20 ENCOUNTER — Ambulatory Visit (INDEPENDENT_AMBULATORY_CARE_PROVIDER_SITE_OTHER): Payer: Medicare Other | Admitting: Internal Medicine

## 2022-11-20 VITALS — BP 120/80 | HR 68 | Temp 97.5°F | Wt 133.0 lb

## 2022-11-20 DIAGNOSIS — A084 Viral intestinal infection, unspecified: Secondary | ICD-10-CM

## 2022-11-20 DIAGNOSIS — G4709 Other insomnia: Secondary | ICD-10-CM | POA: Diagnosis not present

## 2022-11-20 NOTE — Patient Instructions (Signed)
-  Nice seeing you today!!  -Take Ambien between 8 pm and 10 pm.

## 2022-11-20 NOTE — Progress Notes (Signed)
Established Patient Office Visit     CC/Reason for Visit: Discuss some acute concerns  HPI: Jeanne Tran is a 89 y.o. female who is coming in today for the above mentioned reasons.  Here today to discuss 2 issues:  1.  Last week and a day she had 4 episodes of vomiting, the next day she had some nausea.  No fever, no abdominal pain, no diarrhea.  This has since resolved without incident.  2.  She has chronic insomnia and is prescribed 5 mg of Ambien.  She does not sleep well at night and instead sleeps all morning.  It appears she takes her Ambien at 2:00 in the morning.   Past Medical/Surgical History: Past Medical History:  Diagnosis Date   Arthritis    Breast cancer ([place])    hx of left with radiation and lumpectomy   Diverticulosis    GERD (gastroesophageal reflux disease)    History of colonic polyps    History of ERCP    and sphincterotomy for abnormal lfts and dilated biliary ducts 5/05   Hyperlipidemia    Migraine headache    Pulmonary nodule    RLS (restless legs syndrome)    Wears contact lenses     Past Surgical History:  Procedure Laterality Date   ABDOMINAL HYSTERECTOMY     bso   APPENDECTOMY     BREAST LUMPECTOMY  1995   lt lumpsnbx   BREAST LUMPECTOMY WITH RADIOACTIVE SEED LOCALIZATION Left 07/01/2014   Procedure: LEFT BREAST LUMPECTOMY WITH RADIOACTIVE SEED LOCALIZATION;  Surgeon: [name], MD;  Location: [facility];  Service: General;  Laterality: Left;   CERVICAL SPINE SURGERY  3/03   CHOLECYSTECTOMY  2012   Complete Hysterectomy     CYSTOCELE REPAIR     ERCP     for abnormal lfts and dilated biliary ducts 5/05   gallbladder duct open  2006   HEMORRHOID SURGERY     HYSTEROSCOPY     LYMPH NODE BIOPSY     Orthoscopic Rt Knee     SPHINCTEROTOMY     for abnormal lfts and dilated biliary ducts 5/05   tonsillectomy      Social History:  reports that she quit smoking about 28 years ago. Her smoking use included  cigarettes. She has a 75.00 pack-year smoking history. She has never used smokeless tobacco. She reports that she does not drink alcohol and does not use drugs.  Allergies: Allergies  Allergen Reactions   Codeine Other (See Comments)    REACTION: Intolerance w/ pain meds not cough syrup. Codeine makes her bounce off the walls. falling REACTION: Intolerance w/ pain meds not cough syrup. Codeine makes her bounce off the walls.   Aspirin Other (See Comments)    GI issues GI issues    Family History:  Family History  Problem Relation Age of Onset   Mitral valve prolapse Son        with afib to have surgery   Esophageal cancer Neg Hx    Liver disease Neg Hx    Stomach cancer Neg Hx      Current Outpatient Medications:    fluocinonide-emollient (LIDEX-E) 0.05 % cream, Apply 1 application topically 2 (two) times daily. As  Directed., Disp: 30 g, Rfl: 0   levothyroxine (SYNTHROID) 50 MCG tablet, TAKE 1 TABLET(50 MCG) BY MOUTH DAILY, Disp: 90 tablet, Rfl: 1   mirtazapine (REMERON) 15 MG tablet, TAKE 1 TO 2 TABLETS BY MOUTH AT BEDTIME  FOR SLEEP, Disp: 135 tablet, Rfl: 0   rosuvastatin (CRESTOR) 5 MG tablet, TAKE 1 TABLET(5 MG) BY MOUTH DAILY, Disp: 90 tablet, Rfl: 0   sertraline (ZOLOFT) 100 MG tablet, Take 0.5 tablets (50 mg total) by mouth daily., Disp: 90 tablet, Rfl: 1   vitamin B-12 (CYANOCOBALAMIN) 100 MCG tablet, , Disp: , Rfl:    zolpidem (AMBIEN) 10 MG tablet, TAKE 1 TABLET BY MOUTH AT BEDTIME AS NEEDED FOR SLEEP. LIMIT REGULAR USE, Disp: 30 tablet, Rfl: 0  Review of Systems:  Negative unless indicated in HPI.   Physical Exam: Vitals:   11/20/22 1304  BP: 120/80  Pulse: 68  Temp: (!) 97.5 F (36.4 C)  TempSrc: Oral  SpO2: 98%  Weight: 133 lb (60.3 kg)    Body mass index is 23.56 kg/m.   Physical Exam Vitals reviewed.  Constitutional:      Appearance: Normal appearance.  HENT:     Head: Normocephalic and atraumatic.  Eyes:     Conjunctiva/sclera:  Conjunctivae normal.     Pupils: Pupils are equal, round, and reactive to light.  Skin:    General: Skin is warm and dry.  Neurological:     General: No focal deficit present.     Mental Status: She is alert and oriented to person, place, and time.  Psychiatric:        Mood and Affect: Mood normal.        Behavior: Behavior normal.        Thought Content: Thought content normal.        Judgment: Judgment normal.      Impression and Plan:  Other insomnia  Viral gastroenteritis  -Transient nausea and vomiting likely related to viral gastroenteritis, it is now resolved and she is back to baseline, nothing further needed. -She has chronic insomnia on 5 mg of Ambien nightly.  Advised to switch times that she is taking Ambien from 2:00 in the morning to around 8 or 10 PM in the evening.  Time spent:31 minutes reviewing chart, interviewing and examining patient and formulating plan of care.     Lelon Frohlich, MD [facility]

## 2022-12-08 ENCOUNTER — Other Ambulatory Visit: Payer: Self-pay | Admitting: Family

## 2022-12-08 ENCOUNTER — Other Ambulatory Visit: Payer: Self-pay | Admitting: Hematology

## 2022-12-08 DIAGNOSIS — F4322 Adjustment disorder with anxiety: Secondary | ICD-10-CM

## 2022-12-11 ENCOUNTER — Telehealth: Payer: Self-pay | Admitting: Internal Medicine

## 2022-12-11 NOTE — Telephone Encounter (Signed)
Contacted [employer] to schedule their annual wellness visit. Appointment made for 12/19/22.  Shirlean Mylar [facility] AWV direct phone # [phone]   Lm letting patient know her 4/2 phone appt has been changed to 12/19/22.  Also left my number if this is not a good date or time

## 2022-12-14 ENCOUNTER — Other Ambulatory Visit: Payer: Self-pay | Admitting: Internal Medicine

## 2022-12-14 DIAGNOSIS — F4322 Adjustment disorder with anxiety: Secondary | ICD-10-CM

## 2022-12-16 ENCOUNTER — Telehealth: Payer: Self-pay | Admitting: Internal Medicine

## 2022-12-16 NOTE — Telephone Encounter (Signed)
Called patient to schedule Medicare Annual Wellness Visit (AWV). Left message for patient to call back and schedule Medicare Annual Wellness Visit (AWV).  Last date of AWV: 12/14/21    If any questions, please contact me at [phone].  Thank you ,  Barkley Boards AWV direct phone # [phone]  Returned patient call from 3/28

## 2022-12-17 ENCOUNTER — Telehealth: Payer: Self-pay

## 2022-12-17 ENCOUNTER — Telehealth: Payer: Self-pay | Admitting: Internal Medicine

## 2022-12-17 NOTE — Telephone Encounter (Signed)
Called patient to schedule Medicare Annual Wellness Visit (AWV). Left message for patient to call back and schedule Medicare Annual Wellness Visit (AWV).  Last date of AWV: 11/26/21   If any questions, please contact me at [phone].  Thank you ,  Barkley Boards AWV direct phone # [phone]   Patient returned my call left message 12/16/22

## 2022-12-17 NOTE — Telephone Encounter (Signed)
Contacted Jeanne Tran to schedule their annual wellness visit. Appointment made for 12/24/22.  Jeanne Tran AWV direct phone # [phone]   Per patient request r/s appt from 4/4 to 4/9

## 2022-12-17 NOTE — Progress Notes (Signed)
Care Management & Coordination Services Pharmacy Team  Reason for Encounter: General adherence update   Contacted patient for general health update and medication adherence call.  Spoke with patient on 12/17/2022   What concerns do you have about your medications? Patient denies  The patient denies side effects with their medications.   How often do you forget or accidentally miss a dose? Patient denies any known missed doses  Do you use a pillbox? Yes  Are you having any problems getting your medications from your pharmacy? Patient denies  Has the cost of your medications been a concern? Patient denies  Since last visit with PharmD, no interventions have been made.   The patient has not had an ED visit since last contact.   The patient denies problems with their health.   Patient denies concerns or questions for Burman Riis, PharmD at this time.    Care Gaps: AWV - completed 12/14/2021, scheduled 12/17/2022 Covid - overdue Mammogram - overdue   Star Rating Drugs:  Rosuvastatin 5 mg - last filled 06/23/2021 90 DS at [facility] (last noted, patient is not taking)  Chart Updates:  Recent office visits:  None  Recent consult visits:  None  Hospital visits:  None  Medications: Outpatient Encounter Medications as of 12/17/2022  Medication Sig   fluocinonide-emollient (LIDEX-E) 0.05 % cream Apply 1 application topically 2 (two) times daily. As  Directed.   levothyroxine (SYNTHROID) 50 MCG tablet TAKE 1 TABLET(50 MCG) BY MOUTH DAILY   mirtazapine (REMERON) 15 MG tablet TAKE 1 TO 2 TABLETS BY MOUTH AT BEDTIME FOR SLEEP   rosuvastatin (CRESTOR) 5 MG tablet TAKE 1 TABLET(5 MG) BY MOUTH DAILY   sertraline (ZOLOFT) 100 MG tablet TAKE 1 TABLET(100 MG) BY MOUTH DAILY   vitamin B-12 (CYANOCOBALAMIN) 100 MCG tablet    zolpidem (AMBIEN) 10 MG tablet TAKE 1 TABLET BY MOUTH AT BEDTIME AS NEEDED FOR SLEEP. LIMIT REGULAR USE   No facility-administered encounter medications on file  as of 12/17/2022.  Fill History:  Dispensed Days Supply Quantity Provider Pharmacy  LEVOTHYROXINE 0.05MG  (50MCG) TAB 09/27/2022 90 90 each      Dispensed Days Supply Quantity Provider Pharmacy  MIRTAZAPINE 15MG  TABLETS 12/08/2022 30 45 each      Dispensed Days Supply Quantity Provider Pharmacy  ROSUVASTATIN 5MG  TABLETS 06/23/2021 90 90 each      Dispensed Days Supply Quantity Provider Pharmacy  SERTRALINE HYDROCHLORIDE 100MG  TABLET 12/16/2022 90 90 tablet      Dispensed Days Supply Quantity Provider Pharmacy  ZOLPIDEM 10MG  TABLETS 10/13/2022 30 30 each      Recent vitals BP Readings from Last 3 Encounters:  11/20/22 120/80  07/15/22 120/68  06/11/22 118/78   Pulse Readings from Last 3 Encounters:  11/20/22 68  07/15/22 80  06/11/22 75   Wt Readings from Last 3 Encounters:  11/20/22 133 lb (60.3 kg)  07/15/22 128 lb (58.1 kg)  06/11/22 130 lb (59 kg)   BMI Readings from Last 3 Encounters:  11/20/22 23.56 kg/m  07/15/22 22.67 kg/m  06/11/22 22.31 kg/m    Recent lab results    Component Value Date/Time   NA 141 04/01/2022 1055   NA 141 04/17/2017 1504   K 4.7 04/01/2022 1055   K 5.0 04/17/2017 1504   CL 103 04/01/2022 1055   CO2 30 04/01/2022 1055   CO2 28 04/17/2017 1504   GLUCOSE 90 04/01/2022 1055   GLUCOSE 91 04/17/2017 1504   BUN 42 (H) 04/01/2022 1055   BUN 27.3 (  H) 04/17/2017 1504   CREATININE 1.82 (H) 04/01/2022 1055   CREATININE 1.74 (H) 07/05/2020 1256   CREATININE 1.6 (H) 04/17/2017 1504   CALCIUM 9.9 04/01/2022 1055   CALCIUM 10.1 04/17/2017 1504    Lab Results  Component Value Date   CREATININE 1.82 (H) 04/01/2022   GFR 24.87 (L) 04/01/2022   EGFR 31 (L) 04/17/2017   GFRNONAA 26 (L) 07/05/2020   GFRAA 31 (L) 07/05/2020   Lab Results  Component Value Date/Time   HGBA1C 6.3 01/22/2019 10:35 AM   HGBA1C 6.2 02/25/2018 01:13 PM   MICROALBUR <0.7 06/13/2021 02:51 PM    Lab Results  Component Value Date   CHOL 216 (H) 04/01/2022   HDL  63.00 04/01/2022   LDLCALC 124 (H) 04/01/2022   TRIG 145.0 04/01/2022   CHOLHDL 3 04/01/2022    [facility] Pharmacist Assistant [phone]

## 2022-12-19 ENCOUNTER — Telehealth: Payer: Medicare Other | Admitting: Family Medicine

## 2022-12-23 ENCOUNTER — Other Ambulatory Visit: Payer: Self-pay | Admitting: Family

## 2022-12-23 ENCOUNTER — Telehealth: Payer: Self-pay | Admitting: Internal Medicine

## 2022-12-23 MED ORDER — ZOLPIDEM TARTRATE 10 MG PO TABS: ORAL_TABLET | ORAL | 0 refills | Status: DC

## 2022-12-23 NOTE — Telephone Encounter (Addendum)
Prescription Request  12/23/2022  LOV: 03/18/2022  What is the name of the medication or equipment? zolpidem (AMBIEN) 10 MG tablet   Have you contacted your pharmacy to request a refill? Yes   Pt's son called to say Pt only has a few pills left. Son is going out of town on Wednesday, and he is the only one that can go to the pharmacy to pick this Rx up.  Son is asking that refill please be sent ASAP, so that he can pick it up before he leaves.  Son informed MD is OOO on Mondays.  Which pharmacy would you like this sent to?   PLEASE SEND TO:  [id number] [place], [place] - [address] AT [facility] [address] [facility] [address] Phone: [phone]  Fax: [phone]       Patient notified that their request is being sent to the clinical staff for review and that they should receive a response within 2 business days.   Please advise at Mobile [phone] (mobile)

## 2022-12-24 ENCOUNTER — Other Ambulatory Visit: Payer: Self-pay | Admitting: Internal Medicine

## 2022-12-24 ENCOUNTER — Telehealth (INDEPENDENT_AMBULATORY_CARE_PROVIDER_SITE_OTHER): Payer: Medicare Other | Admitting: Family Medicine

## 2022-12-24 VITALS — Wt 135.0 lb

## 2022-12-24 DIAGNOSIS — Z853 Personal history of malignant neoplasm of breast: Secondary | ICD-10-CM

## 2022-12-24 DIAGNOSIS — Z1231 Encounter for screening mammogram for malignant neoplasm of breast: Secondary | ICD-10-CM

## 2022-12-24 DIAGNOSIS — Z0000 Encounter for general adult medical examination without abnormal findings: Secondary | ICD-10-CM | POA: Diagnosis not present

## 2022-12-24 NOTE — Progress Notes (Signed)
PATIENT CHECK-IN and HEALTH RISK ASSESSMENT QUESTIONNAIRE:  -completed by phone/video for upcoming Medicare Preventive Visit  Pre-Visit Check-in: 1)Vitals (height, wt, BP, etc) - record in vitals section for visit on day of visit 2)Review and Update Medications, Allergies PMH, Surgeries, Social history in Epic 3)Hospitalizations in the last year with date/reason? no 4)Review and Update Care Team (patient's specialists) in Epic 5) Complete PHQ9 in Epic  6) Complete Fall Screening in Epic 7)Review all Health Maintenance Due and order under PCP if not done.  8)Medicare Wellness Questionnaire: Answer theses question about your habits: Do you drink alcohol? no If yes, how many drinks do you have a day? Have you ever smoked?yes  Quit date if applicable? 1999  How many packs a day do/did you smoke? 2 packs Do you use smokeless tobacco?no Do you use an illicit drugs?no Do you exercises? A little - walking 20 mins 3 or 4 days Are you sexually active? noNumber of partners? She feels eats a healthy diet Typical breakfast: [place] rolls with cheese Typical lunch [place] rolls with cheese Typical dinner stew Typical snacks: not much  Beverages: Ice water   Answer theses question about you: Can you perform most household chores?yes and has someone to help Do you find it hard to follow a conversation in a noisy room?no Do you often ask people to speak up or repeat themselves?no Do you feel that you have a problem with memory?yes - reports is mild, occasionally forgets something Do you balance your checkbook and or bank acounts?yes  Do you feel safe at home? yes Last dentist visit? 6 months ago Do you need assistance with any of the following: Please note if so   Driving?  Feeding yourself?  Getting from bed to chair?  Getting to the toilet?  Bathing or showering?  Dressing yourself?  Managing money?  Climbing a flight of stairs - difficut  Preparing meals?  Do you have Advanced  Directives in place (Living Will, Healthcare Power or Attorney)? yes   Last eye Exam and location? Last fall - Dr Dione Booze   Do you currently use prescribed or non-prescribed narcotic or opioid pain medications? no  Do you have a history or close family history of breast, ovarian, tubal or peritoneal cancer or a family member with BRCA (breast cancer susceptibility 1 and 2) gene mutations? Patient has had breast cancer twice Nurse/Assistant Credentials/time stamp:  Kern Reap CMA ----------------------------------------------------------------------------------------------------------------------------------------------------------------------------------------------------------------------   MEDICARE ANNUAL PREVENTIVE VISIT WITH PROVIDER: (Welcome to Medicare, initial annual wellness or annual wellness exam)  Virtual Visit via Phone Note  I connected with KAYDRA GELLNER on 12/24/22 by phone  and verified that I am speaking with the correct person using two identifiers.  Location patient: home Location provider:work or home office Persons participating in the virtual visit: patient, provider  Concerns and/or follow up today: reports everything is ok for her.    See HM section in Epic for other details of completed HM.    ROS: negative for report of fevers, unintentional weight loss, vision changes, vision loss, hearing loss or change, chest pain, sob, hemoptysis, melena, hematochezia, hematuria, falls, bleeding or bruising, thoughts of suicide or self harm, memory loss  Patient-completed extensive health risk assessment - reviewed and discussed with the patient: See Health Risk Assessment completed with patient prior to the visit either above or in recent phone note. This was reviewed in detailed with the patient today and appropriate recommendations, orders and referrals were placed as needed per Summary below and patient  instructions.   Review of Medical History: -PMH,  PSH, Family History and current specialty and care providers reviewed and updated and listed below   Patient Care Team: Panosh, Neta MendsWanda K, MD as PCP - General Elliot CousinByrnes, Charles, OD (Optometry) Donalee Citrinram, Gary, MD (Neurosurgery) Avel Peaceosenbower, Todd, MD as Consulting Physician (General Surgery) Malachy MoodFeng, Yan, MD as Consulting Physician (Hematology) Cindee SaltKuzma, Gary, MD as Consulting Physician (Orthopedic Surgery) Meryl DareStark, Malcolm T, MD as Consulting Physician (Gastroenterology) Sallye LatGroat, Christopher, MD as Consulting Physician (Ophthalmology) Aris LotWhitworth, Walter, MD as Consulting Physician (Dermatology) Verner CholPryor, Madeline G, [facility] (Inactive) as Pharmacist (Pharmacist)   Past Medical History:  Diagnosis Date   Arthritis    Breast cancer [facility](HCC)    hx of left with radiation and lumpectomy   Diverticulosis    GERD (gastroesophageal reflux disease)    History of colonic polyps    History of ERCP    and sphincterotomy for abnormal lfts and dilated biliary ducts 5/05   Hyperlipidemia    Migraine headache    Pulmonary nodule    RLS (restless legs syndrome)    Wears contact lenses     Past Surgical History:  Procedure Laterality Date   ABDOMINAL HYSTERECTOMY     bso   APPENDECTOMY     BREAST LUMPECTOMY  1995   lt lumpsnbx   BREAST LUMPECTOMY WITH RADIOACTIVE SEED LOCALIZATION Left 07/01/2014   Procedure: LEFT BREAST LUMPECTOMY WITH RADIOACTIVE SEED LOCALIZATION;  Surgeon: Avel Peaceodd Rosenbower, MD;  Location: [facility];  Service: General;  Laterality: Left;   CERVICAL SPINE SURGERY  3/03   CHOLECYSTECTOMY  2012   Complete Hysterectomy     CYSTOCELE REPAIR     ERCP     for abnormal lfts and dilated biliary ducts 5/05   gallbladder duct open  2006   HEMORRHOID SURGERY     HYSTEROSCOPY     LYMPH NODE BIOPSY     Orthoscopic Rt Knee     SPHINCTEROTOMY     for abnormal lfts and dilated biliary ducts 5/05   tonsillectomy      Social History   Socioeconomic History   Marital status: Widowed     Spouse name: Not on file   Number of children: 3   Years of education: Not on file   Highest education level: Not on file  Occupational History   Occupation: retired  Tobacco Use   Smoking status: Former    Packs/day: 3.00    Years: 25.00    Additional pack years: 0.00    Total pack years: 75.00    Types: Cigarettes    Quit date: 02/26/1994    Years since quitting: 28.8   Smokeless tobacco: Never   Tobacco comments:    quit 24 years   Vaping Use   Vaping Use: Never used  Substance and Sexual Activity   Alcohol use: No   Drug use: No   Sexual activity: Not on file  Other Topics Concern   Not on file  Social History Narrative   Retired; widowed   2 sons   Plays bridge twice monthly   Still drives         Social Determinants of Health   Financial Resource Strain: Low Risk  (01/16/2022)   Overall Financial Resource Strain (CARDIA)    Difficulty of Paying Living Expenses: Not hard at all  Food Insecurity: No Food Insecurity (12/14/2021)   Hunger Vital Sign    Worried About Running Out of Food in the Last Year: Never true  Ran Out of Food in the Last Year: Never true  Transportation Needs: Unknown (12/14/2021)   PRAPARE - Administrator, Civil Service (Medical): No    Lack of Transportation (Non-Medical): Not on file  Physical Activity: Inactive (12/14/2021)   Exercise Vital Sign    Days of Exercise per Week: 0 days    Minutes of Exercise per Session: 0 min  Stress: No Stress Concern Present (12/14/2021)   [employer] of Occupational Health - Occupational Stress Questionnaire    Feeling of Stress : Not at all  Social Connections: Socially Isolated (12/14/2021)   Social Connection and Isolation Panel [NHANES]    Frequency of Communication with Friends and Family: More than three times a week    Frequency of Social Gatherings with Friends and Family: More than three times a week    Attends Religious Services: Never    Database administrator or  Organizations: No    Attends Banker Meetings: Never    Marital Status: Widowed  Intimate Partner Violence: Not At Risk (12/14/2021)   Humiliation, Afraid, Rape, and Kick questionnaire    Fear of Current or Ex-Partner: No    Emotionally Abused: No    Physically Abused: No    Sexually Abused: No    Family History  Problem Relation Age of Onset   Mitral valve prolapse Son        with afib to have surgery   Esophageal cancer Neg Hx    Liver disease Neg Hx    Stomach cancer Neg Hx     Current Outpatient Medications on File Prior to Visit  Medication Sig Dispense Refill   fluocinonide-emollient (LIDEX-E) 0.05 % cream Apply 1 application topically 2 (two) times daily. As  Directed. 30 g 0   levothyroxine (SYNTHROID) 50 MCG tablet TAKE 1 TABLET(50 MCG) BY MOUTH DAILY 90 tablet 1   mirtazapine (REMERON) 15 MG tablet TAKE 1 TO 2 TABLETS BY MOUTH AT BEDTIME FOR SLEEP 135 tablet 0   rosuvastatin (CRESTOR) 5 MG tablet TAKE 1 TABLET(5 MG) BY MOUTH DAILY 90 tablet 0   sertraline (ZOLOFT) 100 MG tablet TAKE 1 TABLET(100 MG) BY MOUTH DAILY 90 tablet 1   vitamin B-12 (CYANOCOBALAMIN) 100 MCG tablet      zolpidem (AMBIEN) 10 MG tablet TAKE 1 TABLET BY MOUTH AT BEDTIME AS NEEDED FOR SLEEP. LIMIT REGULAR USE 30 tablet 0   No current facility-administered medications on file prior to visit.    Allergies  Allergen Reactions   Codeine Other (See Comments)    REACTION: Intolerance w/ pain meds not cough syrup. Codeine makes her bounce off the walls. falling REACTION: Intolerance w/ pain meds not cough syrup. Codeine makes her bounce off the walls.   Aspirin Other (See Comments)    GI issues GI issues       Physical Exam There were no vitals filed for this visit. Estimated body mass index is 23.91 kg/m as calculated from the following:   Height as of 07/15/22: 5\' 3"  (1.6 m).   Weight as of this encounter: 135 lb (61.2 kg).  EKG (optional): deferred due to virtual  visit  GENERAL: alert, oriented, no acute distress detected, full vision exam deferred due to pandemic and/or virtual encounter   PSYCH/NEURO: pleasant and cooperative, no obvious depression or anxiety, speech and thought processing grossly intact, Cognitive function grossly intact  Flowsheet Row Video Visit from 12/24/2022 in [facility]  PHQ-9 Total Score 2  12/24/2022    1:03 PM 12/14/2021   12:40 PM 09/24/2021    3:02 PM 12/11/2020    1:32 PM 08/17/2019    3:24 PM  Depression screen PHQ 2/9  Decreased Interest 0 0 0 0 0  Down, Depressed, Hopeless 0 0 0 0 0  PHQ - 2 Score 0 0 0 0 0  Altered sleeping 1  0    Tired, decreased energy 1  3    Change in appetite 0  0    Feeling bad or failure about yourself  0  0    Trouble concentrating 0  0    Moving slowly or fidgety/restless 0  0    Suicidal thoughts 0  0    PHQ-9 Score 2  3    Difficult doing work/chores Not difficult at all           12/11/2020    1:30 PM 09/24/2021    3:01 PM 12/14/2021   12:44 PM 11/20/2022    1:03 PM 12/24/2022    1:02 PM  Fall Risk  Falls in the past year? 0 0  Was there an injury with Fall? 0 1 0 1 0  Was there an injury with Fall? - Comments   No injuries. Accessed by  Medical attention    Fall Risk Category Calculator 0 0  Fall Risk Category (Retired) Low High Low    (RETIRED) Patient Fall Risk Level Moderate fall risk  Low fall risk    Patient at Risk for Falls Due to History of fall(s);Impaired balance/gait  No Fall Risks    Patient at Risk for Falls Due to - Comments has to turn slowly      Fall risk Follow up Falls evaluation completed;Falls prevention discussed   Falls evaluation completed Falls evaluation completed     SUMMARY AND PLAN:  Encounter for Medicare annual wellness exam  BREAST CANCER, HX OF   Discussed applicable health maintenance/preventive health measures and advised and referred or ordered per patient  preferences:  Health Maintenance  Topic Date Due   COVID-19 Vaccine (5 - 2023-24 season) 05/17/2022, discussed, advised could get at pharmacy   MAMMOGRAM  10/17/2022, pt would like to check with PCP on this. Advised staff to send request to PCP about whether or not she felt screening should continue given pt age - I could not find report for last mammo in ? 2022.   INFLUENZA VACCINE  04/17/2023   DTaP/Tdap/Td (4 - Tdap) 12/15/2023   Medicare Annual Wellness (AWV)  12/24/2023   Pneumonia Vaccine 65+ Years old  Completed   DEXA SCAN  Completed   Zoster Vaccines- Shingrix  Completed   HPV VACCINES  Aged Out      Education and counseling on the following was provided based on the above review of health and a plan/checklist for the patient, along with additional information discussed, was provided for the patient in the patient instructions :  -Provided counseling and plan for increased risk of falling if applicable per above screening. Reviewed and demonstrated safe balance exercises that can be done at home to improve balance and discussed exercise guidelines for adults with include balance exercises at least 3 days per week. She is cautious and is using walker at all times.  -Advised and counseled on a healthy lifestyle - including the importance of a healthy diet, regular physical activity, social connections and stress management. -Reviewed patient's current diet. Advised and counseled on a  whole foods based healthy diet. A summary of a healthy diet was provided in the Patient Instructions.  -reviewed patient's current physical activity level and discussed exercise guidelines for adults. Discussed community resources and ideas for safe exercise at home to assist in meeting exercise guideline recommendations in a safe and healthy way.  -Advise yearly dental visits at minimum and regular eye exams -requested staff to print and mail AVS per patient request   Follow up: see patient instructions      Patient Instructions  I really enjoyed getting to talk with you today! I am available on Tuesdays and Thursdays for virtual visits if you have any questions or concerns, or if I can be of any further assistance.   CHECKLIST FROM ANNUAL WELLNESS VISIT:  -Follow up (please call to schedule if not scheduled after visit):   -yearly for annual wellness visit with primary care office  Here is a list of your preventive care/health maintenance measures and the plan for each if any are due:  PLAN For any measures below that may be due:  -I asked the nurse to check with Dr. Fabian Sharp about continuing mammograms - please call to check on this in 1 week if you have not been contacted -if you wish to get the covid booster you can do so at the pharmacy  Health Maintenance  Topic Date Due   COVID-19 Vaccine (5 - 2023-24 season) 05/17/2022   MAMMOGRAM  10/17/2022   INFLUENZA VACCINE  04/17/2023   DTaP/Tdap/Td (4 - Tdap) 12/15/2023   Medicare Annual Wellness (AWV)  12/24/2023   Pneumonia Vaccine 56+ Years old  Completed   DEXA SCAN  Completed   Zoster Vaccines- Shingrix  Completed   HPV VACCINES  Aged Out    -See a dentist at least yearly  -Get your eyes checked and then per your eye specialist's recommendations  -Other issues addressed today:   -I have included below further information regarding a healthy whole foods based diet, physical activity guidelines for adults, stress management and opportunities for social connections. I hope you find this information useful.   -----------------------------------------------------------------------------------------------------------------------------------------------------------------------------------------------------------------------------------------------------------  NUTRITION: -eat real food: lots of colorful vegetables (half the plate) and fruits -5-7 servings of vegetables and fruits per day (fresh or steamed is best), exp. 2  servings of vegetables with lunch and dinner and 2 servings of fruit per day. Berries and greens such as kale and collards are great choices.  -consume on a regular basis: whole grains (make sure first ingredient on label contains the word "whole"), fresh fruits, fish, nuts, seeds, healthy oils (such as olive oil, avocado oil, grape seed oil) -may eat small amounts of dairy and lean meat on occasion, but avoid processed meats such as ham, bacon, lunch meat, etc. -drink water -try to avoid fast food and pre-packaged foods, processed meat -most experts advise limiting sodium to < 2300mg  per day, should limit further is any chronic conditions such as high blood pressure, heart disease, diabetes, etc. The American Heart Association advised that < 1500mg  is is ideal -try to avoid foods that contain any ingredients with names you do not recognize  -try to avoid sugar/sweets (except for the natural sugar that occurs in fresh fruit) -try to avoid sweet drinks -try to avoid white rice, white bread, pasta (unless whole grain), white or yellow potatoes  EXERCISE GUIDELINES FOR ADULTS: -if you wish to increase your physical activity, do so gradually and with the approval of your doctor -STOP and seek medical care immediately  if you have any chest pain, chest discomfort or trouble breathing when starting or increasing exercise  -move and stretch your body, legs, feet and arms when sitting for long periods -Physical activity guidelines for optimal health in adults: -least 150 minutes per week of aerobic exercise (can talk, but not sing) once approved by your doctor, 20-30 minutes of sustained activity or two 10 minute episodes of sustained activity every day.  -resistance training at least 2 days per week if approved by your doctor -balance exercises 3+ days per week:   Stand somewhere where you have something sturdy to hold onto if you lose balance.    1) lift up on toes, start with 5x per day and work up to  20x   2) stand and lift on leg straight out to the side so that foot is a few inches of the floor, start with 5x each side and work up to 20x each side   3) stand on one foot, start with 5 seconds each side and work up to 20 seconds on each side  If you need ideas or help with getting more active:  -Silver sneakers https://tools.silversneakers.com  -Walk with a Doc: http://www.duncan-williams.com/  -try to include resistance (weight lifting/strength building) and balance exercises twice per week: or the following link for ideas: http://castillo-powell.com/  BuyDucts.dk  STRESS MANAGEMENT: -can try meditating, or just sitting quietly with deep breathing while intentionally relaxing all parts of your body for 5 minutes daily -if you need further help with stress, anxiety or depression please follow up with your primary doctor or contact the wonderful folks at [facility]: [phone]  SOCIAL CONNECTIONS: -options in [place] if you wish to engage in more social and exercise related activities:  -Silver sneakers https://tools.silversneakers.com  -Walk with a Doc: http://www.duncan-williams.com/  -Check out the [facility] Active Adults 50+ section on the [employer] (hiking clubs, book clubs, cards and games, chess, exercise classes, aquatic classes and much more) - see the website for details: https://www.[place].gov/departments/parks-recreation/active-adults50  -YouTube has lots of exercise videos for different ages and abilities as well  -[facility] (a variety of indoor and outdoor inperson activities for adults). (864)399-0306. [address]  -Virtual Online Classes (a variety of topics): see seniorplanet.org or call [phone]  -consider volunteering at a school, hospice center, church, senior center or elsewhere           Terressa Koyanagi, DO

## 2022-12-24 NOTE — Patient Instructions (Addendum)
I really enjoyed getting to talk with you today! I am available on Tuesdays and Thursdays for virtual visits if you have any questions or concerns, or if I can be of any further assistance.   CHECKLIST FROM ANNUAL WELLNESS VISIT:  -Follow up (please call to schedule if not scheduled after visit):   -yearly for annual wellness visit with primary care office  Here is a list of your preventive care/health maintenance measures and the plan for each if any are due:  PLAN For any measures below that may be due:  -I asked the nurse to check with Dr. Fabian SharpPanosh about continuing mammograms - please call to check on this in 1 week if you have not been contacted -if you wish to get the covid booster you can do so at the pharmacy  Health Maintenance  Topic Date Due   COVID-19 Vaccine (5 - 2023-24 season) 05/17/2022   MAMMOGRAM  10/17/2022   INFLUENZA VACCINE  04/17/2023   DTaP/Tdap/Td (4 - Tdap) 12/15/2023   Medicare Annual Wellness (AWV)  12/24/2023   Pneumonia Vaccine 665+ Years old  Completed   DEXA SCAN  Completed   Zoster Vaccines- Shingrix  Completed   HPV VACCINES  Aged Out    -See a dentist at least yearly  -Get your eyes checked and then per your eye specialist's recommendations  -Other issues addressed today:   -I have included below further information regarding a healthy whole foods based diet, physical activity guidelines for adults, stress management and opportunities for social connections. I hope you find this information useful.   -----------------------------------------------------------------------------------------------------------------------------------------------------------------------------------------------------------------------------------------------------------  NUTRITION: -eat real food: lots of colorful vegetables (half the plate) and fruits -5-7 servings of vegetables and fruits per day (fresh or steamed is best), exp. 2 servings of vegetables with lunch  and dinner and 2 servings of fruit per day. Berries and greens such as kale and collards are great choices.  -consume on a regular basis: whole grains (make sure first ingredient on label contains the word "whole"), fresh fruits, fish, nuts, seeds, healthy oils (such as olive oil, avocado oil, grape seed oil) -may eat small amounts of dairy and lean meat on occasion, but avoid processed meats such as ham, bacon, lunch meat, etc. -drink water -try to avoid fast food and pre-packaged foods, processed meat -most experts advise limiting sodium to < 2300mg  per day, should limit further is any chronic conditions such as high blood pressure, heart disease, diabetes, etc. The American Heart Association advised that < 1500mg  is is ideal -try to avoid foods that contain any ingredients with names you do not recognize  -try to avoid sugar/sweets (except for the natural sugar that occurs in fresh fruit) -try to avoid sweet drinks -try to avoid white rice, white bread, pasta (unless whole grain), white or yellow potatoes  EXERCISE GUIDELINES FOR ADULTS: -if you wish to increase your physical activity, do so gradually and with the approval of your doctor -STOP and seek medical care immediately if you have any chest pain, chest discomfort or trouble breathing when starting or increasing exercise  -move and stretch your body, legs, feet and arms when sitting for long periods -Physical activity guidelines for optimal health in adults: -least 150 minutes per week of aerobic exercise (can talk, but not sing) once approved by your doctor, 20-30 minutes of sustained activity or two 10 minute episodes of sustained activity every day.  -resistance training at least 2 days per week if approved by your doctor -balance exercises 3+  days per week:   Stand somewhere where you have something sturdy to hold onto if you lose balance.    1) lift up on toes, start with 5x per day and work up to 20x   2) stand and lift on leg  straight out to the side so that foot is a few inches of the floor, start with 5x each side and work up to 20x each side   3) stand on one foot, start with 5 seconds each side and work up to 20 seconds on each side  If you need ideas or help with getting more active:  -Silver sneakers https://tools.silversneakers.com  -Walk with a Doc: http://www.duncan-williams.com/  -try to include resistance (weight lifting/strength building) and balance exercises twice per week: or the following link for ideas: http://castillo-powell.com/  BuyDucts.dk  STRESS MANAGEMENT: -can try meditating, or just sitting quietly with deep breathing while intentionally relaxing all parts of your body for 5 minutes daily -if you need further help with stress, anxiety or depression please follow up with your primary doctor or contact the wonderful folks at [facility]: [phone]  SOCIAL CONNECTIONS: -options in [place] if you wish to engage in more social and exercise related activities:  -Silver sneakers https://tools.silversneakers.com  -Walk with a Doc: http://www.duncan-williams.com/  -Check out the [facility] Active Adults 50+ section on the [place] of [employer] (hiking clubs, book clubs, cards and games, chess, exercise classes, aquatic classes and much more) - see the website for details: https://www.[place].gov/departments/parks-recreation/active-adults50  -YouTube has lots of exercise videos for different ages and abilities as well  -[facility] (a variety of indoor and outdoor inperson activities for adults). ([phone]. [address]  -Virtual Online Classes (a variety of topics): see seniorplanet.org or call [phone]  -consider volunteering at a school, hospice center, church, senior center or elsewhere

## 2022-12-24 NOTE — Telephone Encounter (Signed)
Spoke to son, Kirk(on [place]). He reports he received a notification from pharmacy that Rx is ready for pick up. No further action is needed.

## 2023-02-17 ENCOUNTER — Telehealth: Payer: Self-pay

## 2023-02-17 NOTE — Progress Notes (Signed)
Care Management & Coordination Services Pharmacy Team  Reason for Encounter: Appointment Reminder  Contacted patient to confirm telephone appointment with Delano Metz, PharmD on 02/18/2023 at 4:00. Unsuccessful outreach. Left voicemail for patient to return call.   Care Gaps: AWV - completed 12/24/2022 Last BP - 11/20/2022 Covid - overdue Mammogram - overdue   Star Rating Drugs:  Rosuvastatin 5 mg - last filled 06/23/2021 90 DS at [facility] (last noted, patient is not taking)  Inetta Fermo [facility]  Clinical Pharmacist Assistant [phone]

## 2023-02-17 NOTE — Progress Notes (Unsigned)
Care Management & Coordination Services Pharmacy Note  02/17/2023 Name:  Jeanne Tran MRN:  [id number] DOB:  1934-07-31  Summary: ***  Recommendations/Changes made from today's visit: ***  Follow up plan: ***   Subjective: Jeanne Tran is an 89 y.o. year old female who is a primary patient of Panosh, Neta Mends, MD.  The care coordination team was consulted for assistance with disease management and care coordination needs.    {CCMTELEPHONEFACETOFACE:21091510} for follow up visit.  Recent office visits: 12/24/22 Kriste Basque, DO - For AWV, no med changes  11/20/22 Peggye Pitt, MD - For insomnia and other concerns. No med changes  Recent consult visits: None  Hospital visits: None in previous 6 months   Objective:  Lab Results  Component Value Date   CREATININE 1.82 (H) 04/01/2022   BUN 42 (H) 04/01/2022   GFR 24.87 (L) 04/01/2022   EGFR 31 (L) 04/17/2017   GFRNONAA 26 (L) 07/05/2020   GFRAA 31 (L) 07/05/2020   NA 141 04/01/2022   K 4.7 04/01/2022   CALCIUM 9.9 04/01/2022   CO2 30 04/01/2022   GLUCOSE 90 04/01/2022    Lab Results  Component Value Date/Time   HGBA1C 6.3 01/22/2019 10:35 AM   HGBA1C 6.2 02/25/2018 01:13 PM   GFR 24.87 (L) 04/01/2022 10:55 AM   GFR 24.74 (L) 01/07/2022 03:27 PM   MICROALBUR <0.7 06/13/2021 02:51 PM    Last diabetic Eye exam: No results found for: "HMDIABEYEEXA"  Last diabetic Foot exam: No results found for: "HMDIABFOOTEX"   Lab Results  Component Value Date   CHOL 216 (H) 04/01/2022   HDL 63.00 04/01/2022   LDLCALC 124 (H) 04/01/2022   TRIG 145.0 04/01/2022   CHOLHDL 3 04/01/2022       Latest Ref Rng & Units 01/07/2022    3:27 PM 06/13/2021    2:51 PM 06/07/2020   12:37 PM  Hepatic Function  Total Protein 6.0 - 8.3 g/dL 6.8  7.1  6.2    6.2   Albumin 3.5 - 5.2 g/dL 4.1  4.2    AST 0 - 37 U/L 19  28  25    25    ALT 0 - 35 U/L 12  24  18    18    Alk Phosphatase 39 - 117 U/L 108  105    Total Bilirubin  0.2 - 1.2 mg/dL 0.4  0.4  0.4    0.4   Bilirubin, Direct 0.0 - 0.3 mg/dL 0.1  0.1  0.1     Lab Results  Component Value Date/Time   TSH 1.21 06/14/2022 02:01 PM   TSH 8.45 (H) 04/01/2022 10:55 AM   FREET4 0.68 04/01/2022 10:55 AM   FREET4 0.79 10/18/2014 12:08 PM       Latest Ref Rng & Units 04/01/2022   10:55 AM 01/07/2022    3:27 PM 06/13/2021    2:51 PM  CBC  WBC 4.0 - 10.5 K/uL 6.1  6.6  6.8   Hemoglobin 12.0 - 15.0 g/dL 78.2  95.6  21.3   Hematocrit 36.0 - 46.0 % 37.2  36.5  39.0   Platelets 150.0 - 400.0 K/uL 211.0  264.0  205.0     Lab Results  Component Value Date/Time   VD25OH 96.96 09/24/2021 04:19 PM   VITAMINB12 >1504 (H) 04/01/2022 10:55 AM   VITAMINB12 278 01/07/2022 03:27 PM    Clinical ASCVD: No  The ASCVD Risk score (Arnett DK, et al., 2019) failed to calculate for the following  reasons:   The 2019 ASCVD risk score is only valid for ages 17 to 82       12/24/2022    1:03 PM 12/14/2021   12:40 PM 09/24/2021    3:02 PM  Depression screen PHQ 2/9  Decreased Interest 0 0 0  Down, Depressed, Hopeless 0 0 0  PHQ - 2 Score 0 0 0  Altered sleeping 1  0  Tired, decreased energy 1  3  Change in appetite 0  0  Feeling bad or failure about yourself  0  0  Trouble concentrating 0  0  Moving slowly or fidgety/restless 0  0  Suicidal thoughts 0  0  PHQ-9 Score 2  3  Difficult doing work/chores Not difficult at all       Social History   Tobacco Use  Smoking Status Former   Packs/day: 3.00   Years: 25.00   Additional pack years: 0.00   Total pack years: 75.00   Types: Cigarettes   Quit date: 02/26/1994   Years since quitting: 28.9  Smokeless Tobacco Never  Tobacco Comments   quit 24 years    BP Readings from Last 3 Encounters:  11/20/22 120/80  07/15/22 120/68  06/11/22 118/78   Pulse Readings from Last 3 Encounters:  11/20/22 68  07/15/22 80  06/11/22 75   Wt Readings from Last 3 Encounters:  12/24/22 135 lb (61.2 kg)  11/20/22 133 lb (60.3  kg)  07/15/22 128 lb (58.1 kg)   BMI Readings from Last 3 Encounters:  12/24/22 23.91 kg/m  11/20/22 23.56 kg/m  07/15/22 22.67 kg/m    Allergies  Allergen Reactions   Codeine Other (See Comments)    REACTION: Intolerance w/ pain meds not cough syrup. Codeine makes her bounce off the walls. falling REACTION: Intolerance w/ pain meds not cough syrup. Codeine makes her bounce off the walls.   Aspirin Other (See Comments)    GI issues GI issues    Medications Reviewed Today     Reviewed by Trenton Gammon, CMA (Certified Medical Assistant) on 12/24/22 at 1301  Med List Status: <None>   Medication Order Taking? Sig Documenting Provider Last Dose Status Informant  fluocinonide-emollient (LIDEX-E) 0.05 % cream [id number] Yes Apply 1 application topically 2 (two) times daily. As  Directed. Panosh, Neta Mends, MD Taking Active   levothyroxine (SYNTHROID) 50 MCG tablet [id number] Yes TAKE 1 TABLET(50 MCG) BY MOUTH DAILY Panosh, Neta Mends, MD Taking Active   mirtazapine (REMERON) 15 MG tablet [id number] Yes TAKE 1 TO 2 TABLETS BY MOUTH AT BEDTIME FOR SLEEP Panosh, Neta Mends, MD Taking Active   rosuvastatin (CRESTOR) 5 MG tablet [id number] Yes TAKE 1 TABLET(5 MG) BY MOUTH DAILY Panosh, Neta Mends, MD Taking Active   sertraline (ZOLOFT) 100 MG tablet [id number] Yes TAKE 1 TABLET(100 MG) BY MOUTH DAILY Worthy Rancher B, FNP Taking Active   vitamin B-12 (CYANOCOBALAMIN) 100 MCG tablet [id number] Yes  [provider] Taking Active   zolpidem (AMBIEN) 10 MG tablet [id number] Yes TAKE 1 TABLET BY MOUTH AT BEDTIME AS NEEDED FOR SLEEP. LIMIT REGULAR USE Eulis Foster, FNP Taking Active             SDOH:  (Social Determinants of Health) assessments and interventions performed: Yes SDOH Interventions    Flowsheet Row Chronic Care Management from 01/16/2022 in [facility] [place] Clinical Support from 12/14/2021 in [facility] [place] Clinical Support  from 12/11/2020 in [facility]  at [facility]  SDOH Interventions     Food Insecurity Interventions -- Intervention Not Indicated Intervention Not Indicated  Housing Interventions Intervention Not Indicated Intervention Not Indicated Intervention Not Indicated  Transportation Interventions -- Intervention Not Indicated Intervention Not Indicated  Financial Strain Interventions Intervention Not Indicated Intervention Not Indicated Intervention Not Indicated  Physical Activity Interventions -- Intervention Not Indicated Intervention Not Indicated  Stress Interventions -- Intervention Not Indicated Intervention Not Indicated  Social Connections Interventions -- Intervention Not Indicated Intervention Not Indicated       Medication Assistance: {MEDASSISTANCEINFO:25044}  Medication Access: Name and location of current pharmacy:  [id number] [address] - [place], [place] - [address] [place] [address] Phone: [phone] Fax: [phone]  [facility] - [place], [place] - [address] [place] [address] Phone: [phone] Fax: [phone]  [id number] [place], [place] - [address] AT [facility] [address] [facility] [address] & [facility] [address] [place] [address] Phone: [phone] Fax: [phone]  [facility] [place], [place] - [address] [place] [address] [place] [address] Phone: [phone] Fax: [phone]  [id number] [place], [place] - [address] [place] [address] Phone: [phone] Fax: [phone]  Within the past 30 days, how often has patient missed a dose of medication? *** Is a pillbox or other method used to improve adherence? {YES/NO:21197} Factors that may affect medication adherence? {CHL DESC; BARRIERS:21522} Are meds synced by current  pharmacy? {YES/NO:21197} Are meds delivered by current pharmacy? {YES/NO:21197} Does patient experience delays in picking up medications due to transportation concerns? {YES/NO:21197}  Compliance/Adherence/Medication fill history: Care Gaps: COVID Vaccine - last 01/12/21 Mammogram -  ordered, pending scheduling  Star-Rating Drugs: Rosuvastatin 5mg  PDC 0%   Assessment/Plan Hyperlipidemia: (LDL goal < 100) -Uncontrolled -Current treatment: Rosuvastatin 5mg  1 qd -Medications previously tried: None  -Current dietary patterns: *** -Current exercise habits: *** -Educated on {CCM HLD Counseling:25126} -{CCMPHARMDINTERVENTION:25122}  Osteoporosis / Osteopenia (Goal Prevent bone fracture and reduce bone loss) -{US controlled/uncontrolled:25276} -Last DEXA Scan: 01/24/21   T-Score femoral neck: -1.6 LFN  10-year probability of major osteoporotic fracture: <20%  10-year probability of hip fracture: 5.6% -Patient is a candidate for pharmacologic treatment due to T-Score -1.0 to -2.5 and 10-year risk of hip fracture > 3% -Current treatment  None -Medications previously tried: fosamax (unknown), miacalcin spray  -{Osteoporosis Counseling:23892} -{CCMPHARMDINTERVENTION:25122}  Sherrill Raring Clinical Pharmacist [phone]

## 2023-02-21 ENCOUNTER — Other Ambulatory Visit: Payer: Self-pay | Admitting: Family

## 2023-02-26 NOTE — Telephone Encounter (Signed)
Pt's son called to say Pt is almost out of this Rx.  Please send refill to:   [id number] [place], [place] - [address] AT [facility] [address] [facility] [address] & [facility] [address] Phone: [phone]  Fax: [phone]      LOV:  11/20/22  Son is aware MD is OOO until next week.  Son says he is going out of town and would like to make sure she has enough pills until his return.

## 2023-02-27 NOTE — Telephone Encounter (Signed)
Attempted to reach pt. Left a detail message that prescription is sent. To call back if have any questions.

## 2023-03-05 ENCOUNTER — Encounter: Payer: Self-pay | Admitting: Family Medicine

## 2023-03-05 ENCOUNTER — Ambulatory Visit (INDEPENDENT_AMBULATORY_CARE_PROVIDER_SITE_OTHER): Payer: Medicare Other | Admitting: Family Medicine

## 2023-03-05 VITALS — BP 132/80 | HR 73 | Temp 98.2°F | Wt 134.6 lb

## 2023-03-05 DIAGNOSIS — S60012A Contusion of left thumb without damage to nail, initial encounter: Secondary | ICD-10-CM

## 2023-03-05 NOTE — Progress Notes (Signed)
   Subjective:    Patient ID: Jeanne Tran, female    DOB: 1934-08-10, 89 y.o.   MRN: [id number]  HPI Here with her caretaker for an injury that occurred during a fall at home about 10 days ago. She says she was reaching for something and lost her balance. There was no head injury or LOC. She injured her left thumb that day, but she only mentioned it to her caretaker yesterday. She was able to stop the bleeding by wearing a Bandaid for a few days. She has no pain in the thumb. However she has trouble moving it.    Review of Systems  Constitutional: Negative.   Respiratory: Negative.    Cardiovascular: Negative.   Skin:  Positive for wound.  Neurological: Negative.        Objective:   Physical Exam Constitutional:      Comments: Walks with a walker   Cardiovascular:     Rate and Rhythm: Normal rate and regular rhythm.     Pulses: Normal pulses.     Heart sounds: Normal heart sounds.  Pulmonary:     Effort: Pulmonary effort is normal.     Breath sounds: Normal breath sounds.  Musculoskeletal:     Comments: Her left thumb is swollen, particularly around the IP joint. It is not tender. The laceration looks clean and it has already closed. She can flex the tip of the thumb against resistance, but she cannot extend the tip of the thumb   Neurological:     Mental Status: She is alert.           Assessment & Plan:  She has a left thumb injury that possibly involves a fracture or tendon damage. We will refer her to Hand Surgery asap to evaluate.  Gershon Crane, MD

## 2023-03-21 ENCOUNTER — Other Ambulatory Visit: Payer: Self-pay | Admitting: Internal Medicine

## 2023-03-24 ENCOUNTER — Other Ambulatory Visit: Payer: Self-pay | Admitting: Family

## 2023-03-24 DIAGNOSIS — F4322 Adjustment disorder with anxiety: Secondary | ICD-10-CM

## 2023-03-26 DIAGNOSIS — S62639A Displaced fracture of distal phalanx of unspecified finger, initial encounter for closed fracture: Secondary | ICD-10-CM | POA: Diagnosis not present

## 2023-03-26 DIAGNOSIS — S60012A Contusion of left thumb without damage to nail, initial encounter: Secondary | ICD-10-CM | POA: Diagnosis not present

## 2023-03-27 DIAGNOSIS — S60012A Contusion of left thumb without damage to nail, initial encounter: Secondary | ICD-10-CM | POA: Diagnosis not present

## 2023-03-27 DIAGNOSIS — M25642 Stiffness of left hand, not elsewhere classified: Secondary | ICD-10-CM | POA: Diagnosis not present

## 2023-04-02 DIAGNOSIS — S60012A Contusion of left thumb without damage to nail, initial encounter: Secondary | ICD-10-CM | POA: Diagnosis not present

## 2023-04-02 DIAGNOSIS — S62639A Displaced fracture of distal phalanx of unspecified finger, initial encounter for closed fracture: Secondary | ICD-10-CM | POA: Diagnosis not present

## 2023-04-09 DIAGNOSIS — S60012A Contusion of left thumb without damage to nail, initial encounter: Secondary | ICD-10-CM | POA: Diagnosis not present

## 2023-04-09 DIAGNOSIS — M25642 Stiffness of left hand, not elsewhere classified: Secondary | ICD-10-CM | POA: Diagnosis not present

## 2023-04-09 DIAGNOSIS — S62639A Displaced fracture of distal phalanx of unspecified finger, initial encounter for closed fracture: Secondary | ICD-10-CM | POA: Diagnosis not present

## 2023-04-22 DIAGNOSIS — M25642 Stiffness of left hand, not elsewhere classified: Secondary | ICD-10-CM | POA: Diagnosis not present

## 2023-04-22 DIAGNOSIS — S62639A Displaced fracture of distal phalanx of unspecified finger, initial encounter for closed fracture: Secondary | ICD-10-CM | POA: Diagnosis not present

## 2023-04-22 DIAGNOSIS — S60012A Contusion of left thumb without damage to nail, initial encounter: Secondary | ICD-10-CM | POA: Diagnosis not present

## 2023-04-25 ENCOUNTER — Other Ambulatory Visit: Payer: Self-pay

## 2023-04-25 ENCOUNTER — Emergency Department (HOSPITAL_COMMUNITY): Payer: Medicare Other

## 2023-04-25 ENCOUNTER — Encounter (HOSPITAL_COMMUNITY): Payer: Self-pay

## 2023-04-25 ENCOUNTER — Inpatient Hospital Stay (HOSPITAL_COMMUNITY)
Admission: EM | Admit: 2023-04-25 | Discharge: 2023-05-01 | DRG: 956 | Disposition: A | Discharge status: Skilled Nursing Facility | Payer: Medicare Other | Attending: Internal Medicine | Admitting: Internal Medicine

## 2023-04-25 DIAGNOSIS — Z981 Arthrodesis status: Secondary | ICD-10-CM | POA: Diagnosis not present

## 2023-04-25 DIAGNOSIS — K5900 Constipation, unspecified: Secondary | ICD-10-CM | POA: Diagnosis present

## 2023-04-25 DIAGNOSIS — R0902 Hypoxemia: Secondary | ICD-10-CM | POA: Diagnosis not present

## 2023-04-25 DIAGNOSIS — S72142D Displaced intertrochanteric fracture of left femur, subsequent encounter for closed fracture with routine healing: Secondary | ICD-10-CM | POA: Diagnosis not present

## 2023-04-25 DIAGNOSIS — M8580 Other specified disorders of bone density and structure, unspecified site: Secondary | ICD-10-CM | POA: Diagnosis not present

## 2023-04-25 DIAGNOSIS — K589 Irritable bowel syndrome without diarrhea: Secondary | ICD-10-CM | POA: Diagnosis not present

## 2023-04-25 DIAGNOSIS — Z87891 Personal history of nicotine dependence: Secondary | ICD-10-CM | POA: Diagnosis not present

## 2023-04-25 DIAGNOSIS — M47816 Spondylosis without myelopathy or radiculopathy, lumbar region: Secondary | ICD-10-CM | POA: Diagnosis not present

## 2023-04-25 DIAGNOSIS — M6281 Muscle weakness (generalized): Secondary | ICD-10-CM | POA: Diagnosis not present

## 2023-04-25 DIAGNOSIS — K219 Gastro-esophageal reflux disease without esophagitis: Secondary | ICD-10-CM | POA: Diagnosis present

## 2023-04-25 DIAGNOSIS — W19XXXA Unspecified fall, initial encounter: Secondary | ICD-10-CM | POA: Diagnosis present

## 2023-04-25 DIAGNOSIS — N184 Chronic kidney disease, stage 4 (severe): Secondary | ICD-10-CM | POA: Diagnosis not present

## 2023-04-25 DIAGNOSIS — Z79890 Hormone replacement therapy (postmenopausal): Secondary | ICD-10-CM

## 2023-04-25 DIAGNOSIS — S72002A Fracture of unspecified part of neck of left femur, initial encounter for closed fracture: Secondary | ICD-10-CM

## 2023-04-25 DIAGNOSIS — Z923 Personal history of irradiation: Secondary | ICD-10-CM

## 2023-04-25 DIAGNOSIS — R739 Hyperglycemia, unspecified: Secondary | ICD-10-CM | POA: Diagnosis present

## 2023-04-25 DIAGNOSIS — G43909 Migraine, unspecified, not intractable, without status migrainosus: Secondary | ICD-10-CM | POA: Diagnosis present

## 2023-04-25 DIAGNOSIS — I700 Atherosclerosis of aorta: Secondary | ICD-10-CM | POA: Diagnosis not present

## 2023-04-25 DIAGNOSIS — Y92009 Unspecified place in unspecified non-institutional (private) residence as the place of occurrence of the external cause: Secondary | ICD-10-CM

## 2023-04-25 DIAGNOSIS — E871 Hypo-osmolality and hyponatremia: Secondary | ICD-10-CM | POA: Diagnosis present

## 2023-04-25 DIAGNOSIS — Z885 Allergy status to narcotic agent status: Secondary | ICD-10-CM

## 2023-04-25 DIAGNOSIS — D62 Acute posthemorrhagic anemia: Secondary | ICD-10-CM | POA: Diagnosis not present

## 2023-04-25 DIAGNOSIS — M5136 Other intervertebral disc degeneration, lumbar region: Secondary | ICD-10-CM | POA: Diagnosis not present

## 2023-04-25 DIAGNOSIS — Z853 Personal history of malignant neoplasm of breast: Secondary | ICD-10-CM | POA: Diagnosis not present

## 2023-04-25 DIAGNOSIS — Z79899 Other long term (current) drug therapy: Secondary | ICD-10-CM | POA: Diagnosis not present

## 2023-04-25 DIAGNOSIS — M069 Rheumatoid arthritis, unspecified: Secondary | ICD-10-CM | POA: Diagnosis not present

## 2023-04-25 DIAGNOSIS — Z9181 History of falling: Secondary | ICD-10-CM | POA: Diagnosis not present

## 2023-04-25 DIAGNOSIS — E440 Moderate protein-calorie malnutrition: Secondary | ICD-10-CM | POA: Diagnosis present

## 2023-04-25 DIAGNOSIS — G4700 Insomnia, unspecified: Secondary | ICD-10-CM | POA: Diagnosis present

## 2023-04-25 DIAGNOSIS — S72142A Displaced intertrochanteric fracture of left femur, initial encounter for closed fracture: Secondary | ICD-10-CM | POA: Diagnosis not present

## 2023-04-25 DIAGNOSIS — G319 Degenerative disease of nervous system, unspecified: Secondary | ICD-10-CM | POA: Diagnosis not present

## 2023-04-25 DIAGNOSIS — S0003XA Contusion of scalp, initial encounter: Secondary | ICD-10-CM | POA: Diagnosis present

## 2023-04-25 DIAGNOSIS — Z7401 Bed confinement status: Secondary | ICD-10-CM | POA: Diagnosis not present

## 2023-04-25 DIAGNOSIS — T796XXA Traumatic ischemia of muscle, initial encounter: Secondary | ICD-10-CM | POA: Diagnosis present

## 2023-04-25 DIAGNOSIS — D631 Anemia in chronic kidney disease: Secondary | ICD-10-CM | POA: Diagnosis present

## 2023-04-25 DIAGNOSIS — R9431 Abnormal electrocardiogram [ECG] [EKG]: Secondary | ICD-10-CM | POA: Diagnosis not present

## 2023-04-25 DIAGNOSIS — Z886 Allergy status to analgesic agent status: Secondary | ICD-10-CM

## 2023-04-25 DIAGNOSIS — Z6823 Body mass index (BMI) 23.0-23.9, adult: Secondary | ICD-10-CM | POA: Diagnosis not present

## 2023-04-25 DIAGNOSIS — M1712 Unilateral primary osteoarthritis, left knee: Secondary | ICD-10-CM | POA: Diagnosis not present

## 2023-04-25 DIAGNOSIS — R41841 Cognitive communication deficit: Secondary | ICD-10-CM | POA: Diagnosis not present

## 2023-04-25 DIAGNOSIS — Z043 Encounter for examination and observation following other accident: Secondary | ICD-10-CM | POA: Diagnosis not present

## 2023-04-25 DIAGNOSIS — G2581 Restless legs syndrome: Secondary | ICD-10-CM | POA: Diagnosis present

## 2023-04-25 DIAGNOSIS — E785 Hyperlipidemia, unspecified: Secondary | ICD-10-CM | POA: Diagnosis present

## 2023-04-25 DIAGNOSIS — E039 Hypothyroidism, unspecified: Secondary | ICD-10-CM | POA: Diagnosis present

## 2023-04-25 DIAGNOSIS — M6282 Rhabdomyolysis: Secondary | ICD-10-CM | POA: Diagnosis present

## 2023-04-25 DIAGNOSIS — M160 Bilateral primary osteoarthritis of hip: Secondary | ICD-10-CM | POA: Diagnosis not present

## 2023-04-25 DIAGNOSIS — R2681 Unsteadiness on feet: Secondary | ICD-10-CM | POA: Diagnosis not present

## 2023-04-25 DIAGNOSIS — R531 Weakness: Secondary | ICD-10-CM | POA: Diagnosis not present

## 2023-04-25 DIAGNOSIS — S06320A Contusion and laceration of left cerebrum without loss of consciousness, initial encounter: Secondary | ICD-10-CM | POA: Diagnosis not present

## 2023-04-25 DIAGNOSIS — R000 Tachycardia, unspecified: Secondary | ICD-10-CM | POA: Diagnosis not present

## 2023-04-25 DIAGNOSIS — S72009A Fracture of unspecified part of neck of unspecified femur, initial encounter for closed fracture: Secondary | ICD-10-CM | POA: Diagnosis not present

## 2023-04-25 DIAGNOSIS — I129 Hypertensive chronic kidney disease with stage 1 through stage 4 chronic kidney disease, or unspecified chronic kidney disease: Secondary | ICD-10-CM | POA: Diagnosis not present

## 2023-04-25 DIAGNOSIS — S72092D Other fracture of head and neck of left femur, subsequent encounter for closed fracture with routine healing: Secondary | ICD-10-CM | POA: Diagnosis not present

## 2023-04-25 DIAGNOSIS — M1990 Unspecified osteoarthritis, unspecified site: Secondary | ICD-10-CM | POA: Diagnosis not present

## 2023-04-25 DIAGNOSIS — R2689 Other abnormalities of gait and mobility: Secondary | ICD-10-CM | POA: Diagnosis not present

## 2023-04-25 LAB — CBC WITH DIFFERENTIAL/PLATELET
Abs Immature Granulocytes: 0.07 10*3/uL (ref 0.00–0.07)
Basophils Absolute: 0.1 10*3/uL (ref 0.0–0.1)
Basophils Relative: 0 %
Eosinophils Absolute: 0 10*3/uL (ref 0.0–0.5)
Eosinophils Relative: 0 %
HCT: 37.1 % (ref 36.0–46.0)
Hemoglobin: 12 g/dL (ref 12.0–15.0)
Immature Granulocytes: 1 %
Lymphocytes Relative: 5 %
Lymphs Abs: 0.6 10*3/uL — ABNORMAL LOW (ref 0.7–4.0)
MCH: 30.9 pg (ref 26.0–34.0)
MCHC: 32.3 g/dL (ref 30.0–36.0)
MCV: 95.6 fL (ref 80.0–100.0)
Monocytes Absolute: 0.8 10*3/uL (ref 0.1–1.0)
Monocytes Relative: 6 %
Neutro Abs: 12 10*3/uL — ABNORMAL HIGH (ref 1.7–7.7)
Neutrophils Relative %: 88 %
Platelets: 181 10*3/uL (ref 150–400)
RBC: 3.88 MIL/uL (ref 3.87–5.11)
RDW: 14.1 % (ref 11.5–15.5)
WBC: 13.6 10*3/uL — ABNORMAL HIGH (ref 4.0–10.5)
nRBC: 0 % (ref 0.0–0.2)

## 2023-04-25 LAB — COMPREHENSIVE METABOLIC PANEL
ALT: 26 U/L (ref 0–44)
AST: 37 U/L (ref 15–41)
Albumin: 3.8 g/dL (ref 3.5–5.0)
Alkaline Phosphatase: 103 U/L (ref 38–126)
Anion gap: 9 (ref 5–15)
BUN: 44 mg/dL — ABNORMAL HIGH (ref 8–23)
CO2: 23 mmol/L (ref 22–32)
Calcium: 8.7 mg/dL — ABNORMAL LOW (ref 8.9–10.3)
Chloride: 103 mmol/L (ref 98–111)
Creatinine, Ser: 1.93 mg/dL — ABNORMAL HIGH (ref 0.44–1.00)
GFR, Estimated: 25 mL/min — ABNORMAL LOW (ref >60)
Glucose, Bld: 172 mg/dL — ABNORMAL HIGH (ref 70–99)
Potassium: 3.7 mmol/L (ref 3.5–5.1)
Sodium: 135 mmol/L (ref 135–145)
Total Bilirubin: 0.6 mg/dL (ref 0.3–1.2)
Total Protein: 6.6 g/dL (ref 6.5–8.1)

## 2023-04-25 LAB — CK: Total CK: 698 U/L — ABNORMAL HIGH (ref 38–234)

## 2023-04-25 LAB — PROTIME-INR
INR: 1 (ref 0.8–1.2)
Prothrombin Time: 13.7 seconds (ref 11.4–15.2)

## 2023-04-25 NOTE — ED Triage Notes (Signed)
Pt BIBA after having a mechanical fall per medics. Pt fell around 1730-1800 but wasn't able to get to fall alert access until about 2100. Per medic hematoma to back of head but controlled bleeding that ceased on its own. Left shortening outward rotation to left leg. Pain only w/ movement. Denies LOC, no blood thinners, or c/o headache. Remains A&O x4 during triage.

## 2023-04-25 NOTE — ED Provider Notes (Signed)
[facility] AT [facility] Provider Note   CSN: [id number] Arrival date & time: 04/25/23  2223     History {Add pertinent medical, surgical, social history, OB history to HPI:1} Chief Complaint  Patient presents with   Jeanne Tran is a 89 y.o. female.  The history is provided by the patient and medical records.  Fall   89 y.o. F with hx of hyperlipidemia, GERD, arthritis, restless leg, presenting to the ED after a fall.  She fell sometime between 5:30-6PM but was not able to call for help for a few hours.  Upon EMS arrival she was still lying in the floor.  Left leg is shortened, tied with sheet by EMS which has helped with pain.  She did recall striking her head during fall but no LOC.  She is not on anticoagulation.  She denies any significant pain unless she has to move.  Home Medications Prior to Admission medications   Medication Sig Start Date End Date Taking? Authorizing Provider  fluocinonide-emollient (LIDEX-E) 0.05 % cream Apply 1 application topically 2 (two) times daily. As  Directed. 03/10/19   Panosh, Neta Mends, MD  levothyroxine (SYNTHROID) 50 MCG tablet TAKE 1 TABLET(50 MCG) BY MOUTH DAILY 03/24/23   Panosh, Neta Mends, MD  mirtazapine (REMERON) 15 MG tablet TAKE 1 TO 2 TABLETS BY MOUTH AT BEDTIME FOR SLEEP 10/29/22   Panosh, Neta Mends, MD  rosuvastatin (CRESTOR) 5 MG tablet TAKE 1 TABLET(5 MG) BY MOUTH DAILY 03/26/21   Panosh, Neta Mends, MD  sertraline (ZOLOFT) 100 MG tablet TAKE 1 TABLET(100 MG) BY MOUTH DAILY 12/16/22   Worthy Rancher B, FNP  vitamin B-12 (CYANOCOBALAMIN) 100 MCG tablet     [provider]  zolpidem (AMBIEN) 10 MG tablet TAKE 1 TABLET BY MOUTH AT BEDTIME AS NEEDED FOR SLEEP 02/26/23   Worthy Rancher B, FNP      Allergies    Codeine and Aspirin    Review of Systems   Review of Systems  Musculoskeletal:  Positive for arthralgias.  All other systems reviewed and are negative.   Physical Exam Updated Vital  Signs BP 136/80 (BP Location: Right Arm)   Pulse 90   Temp 97.6 F (36.4 C) (Oral)   Resp 14   SpO2 92%   Physical Exam Vitals and nursing note reviewed.  Constitutional:      Appearance: She is well-developed.  HENT:     Head: Normocephalic and atraumatic.     Comments: Contusion occipital scalp Eyes:     Conjunctiva/sclera: Conjunctivae normal.     Pupils: Pupils are equal, round, and reactive to light.  Neck:     Comments: C-collar in place Cardiovascular:     Rate and Rhythm: Normal rate and regular rhythm.     Heart sounds: Normal heart sounds.  Pulmonary:     Effort: Pulmonary effort is normal. No respiratory distress.     Breath sounds: Normal breath sounds. No rhonchi.  Abdominal:     General: Bowel sounds are normal.     Palpations: Abdomen is soft.     Tenderness: There is no abdominal tenderness. There is no rebound.  Musculoskeletal:        General: Normal range of motion.     Comments: Left hip shortened and externally rotated, DP pulse intact, moving toes normally, sensation intact  Skin:    General: Skin is warm and dry.  Neurological:     Mental Status: She is  alert and oriented to person, place, and time.     ED Results / Procedures / Treatments   Labs (all labs ordered are listed, but only abnormal results are displayed) Labs Reviewed  CBC WITH DIFFERENTIAL/PLATELET  COMPREHENSIVE METABOLIC PANEL  PROTIME-INR  CK  TYPE AND SCREEN    EKG None  Radiology No results found.  Procedures Procedures  {Document cardiac monitor, telemetry assessment procedure when appropriate:1}  Medications Ordered in ED Medications - No data to display  ED Course/ Medical Decision Making/ A&P   {   Click here for ABCD2, HEART and other calculatorsREFRESH Note before signing :1}                              Medical Decision Making Amount and/or Complexity of Data Reviewed Labs: ordered. Radiology: ordered. ECG/medicine tests:  ordered.   ***  {Document critical care time when appropriate:1} {Document review of labs and clinical decision tools ie heart score, Chads2Vasc2 etc:1}  {Document your independent review of radiology images, and any outside records:1} {Document your discussion with family members, caretakers, and with consultants:1} {Document social determinants of health affecting pt's care:1} {Document your decision making why or why not admission, treatments were needed:1} Final Clinical Impression(s) / ED Diagnoses Final diagnoses:  None    Rx / DC Orders ED Discharge Orders     None

## 2023-04-26 ENCOUNTER — Inpatient Hospital Stay (HOSPITAL_COMMUNITY): Payer: Medicare Other | Admitting: Anesthesiology

## 2023-04-26 ENCOUNTER — Emergency Department (HOSPITAL_COMMUNITY): Payer: Medicare Other

## 2023-04-26 ENCOUNTER — Inpatient Hospital Stay (HOSPITAL_COMMUNITY): Payer: Medicare Other

## 2023-04-26 ENCOUNTER — Encounter (HOSPITAL_COMMUNITY)
Admission: EM | Disposition: A | Discharge status: Skilled Nursing Facility | Payer: Self-pay | Source: Home / Self Care | Attending: Internal Medicine

## 2023-04-26 DIAGNOSIS — S72092D Other fracture of head and neck of left femur, subsequent encounter for closed fracture with routine healing: Secondary | ICD-10-CM | POA: Diagnosis not present

## 2023-04-26 DIAGNOSIS — M6281 Muscle weakness (generalized): Secondary | ICD-10-CM | POA: Diagnosis not present

## 2023-04-26 DIAGNOSIS — I129 Hypertensive chronic kidney disease with stage 1 through stage 4 chronic kidney disease, or unspecified chronic kidney disease: Secondary | ICD-10-CM | POA: Diagnosis not present

## 2023-04-26 DIAGNOSIS — S72142A Displaced intertrochanteric fracture of left femur, initial encounter for closed fracture: Secondary | ICD-10-CM

## 2023-04-26 DIAGNOSIS — Z885 Allergy status to narcotic agent status: Secondary | ICD-10-CM | POA: Diagnosis not present

## 2023-04-26 DIAGNOSIS — N184 Chronic kidney disease, stage 4 (severe): Secondary | ICD-10-CM

## 2023-04-26 DIAGNOSIS — M8580 Other specified disorders of bone density and structure, unspecified site: Secondary | ICD-10-CM | POA: Diagnosis not present

## 2023-04-26 DIAGNOSIS — R41841 Cognitive communication deficit: Secondary | ICD-10-CM | POA: Diagnosis not present

## 2023-04-26 DIAGNOSIS — R739 Hyperglycemia, unspecified: Secondary | ICD-10-CM | POA: Diagnosis present

## 2023-04-26 DIAGNOSIS — S72142D Displaced intertrochanteric fracture of left femur, subsequent encounter for closed fracture with routine healing: Secondary | ICD-10-CM | POA: Diagnosis not present

## 2023-04-26 DIAGNOSIS — Z87891 Personal history of nicotine dependence: Secondary | ICD-10-CM | POA: Diagnosis not present

## 2023-04-26 DIAGNOSIS — R2681 Unsteadiness on feet: Secondary | ICD-10-CM | POA: Diagnosis not present

## 2023-04-26 DIAGNOSIS — S32010A Wedge compression fracture of first lumbar vertebra, initial encounter for closed fracture: Secondary | ICD-10-CM | POA: Insufficient documentation

## 2023-04-26 DIAGNOSIS — S72002A Fracture of unspecified part of neck of left femur, initial encounter for closed fracture: Secondary | ICD-10-CM | POA: Diagnosis present

## 2023-04-26 DIAGNOSIS — E871 Hypo-osmolality and hyponatremia: Secondary | ICD-10-CM | POA: Diagnosis present

## 2023-04-26 DIAGNOSIS — Z79899 Other long term (current) drug therapy: Secondary | ICD-10-CM | POA: Diagnosis not present

## 2023-04-26 DIAGNOSIS — G43909 Migraine, unspecified, not intractable, without status migrainosus: Secondary | ICD-10-CM | POA: Diagnosis present

## 2023-04-26 DIAGNOSIS — Z886 Allergy status to analgesic agent status: Secondary | ICD-10-CM | POA: Diagnosis not present

## 2023-04-26 DIAGNOSIS — W19XXXA Unspecified fall, initial encounter: Secondary | ICD-10-CM | POA: Diagnosis present

## 2023-04-26 DIAGNOSIS — E039 Hypothyroidism, unspecified: Secondary | ICD-10-CM | POA: Diagnosis present

## 2023-04-26 DIAGNOSIS — D631 Anemia in chronic kidney disease: Secondary | ICD-10-CM | POA: Diagnosis present

## 2023-04-26 DIAGNOSIS — Y92009 Unspecified place in unspecified non-institutional (private) residence as the place of occurrence of the external cause: Secondary | ICD-10-CM | POA: Diagnosis not present

## 2023-04-26 DIAGNOSIS — Z853 Personal history of malignant neoplasm of breast: Secondary | ICD-10-CM | POA: Diagnosis not present

## 2023-04-26 DIAGNOSIS — E785 Hyperlipidemia, unspecified: Secondary | ICD-10-CM | POA: Diagnosis present

## 2023-04-26 DIAGNOSIS — K5900 Constipation, unspecified: Secondary | ICD-10-CM | POA: Diagnosis present

## 2023-04-26 DIAGNOSIS — Z9181 History of falling: Secondary | ICD-10-CM | POA: Diagnosis not present

## 2023-04-26 DIAGNOSIS — R9431 Abnormal electrocardiogram [ECG] [EKG]: Secondary | ICD-10-CM

## 2023-04-26 DIAGNOSIS — M6282 Rhabdomyolysis: Secondary | ICD-10-CM | POA: Diagnosis present

## 2023-04-26 DIAGNOSIS — Z6823 Body mass index (BMI) 23.0-23.9, adult: Secondary | ICD-10-CM | POA: Diagnosis not present

## 2023-04-26 DIAGNOSIS — Z923 Personal history of irradiation: Secondary | ICD-10-CM | POA: Diagnosis not present

## 2023-04-26 DIAGNOSIS — G2581 Restless legs syndrome: Secondary | ICD-10-CM | POA: Diagnosis present

## 2023-04-26 DIAGNOSIS — Z7401 Bed confinement status: Secondary | ICD-10-CM | POA: Diagnosis not present

## 2023-04-26 DIAGNOSIS — M069 Rheumatoid arthritis, unspecified: Secondary | ICD-10-CM | POA: Diagnosis not present

## 2023-04-26 DIAGNOSIS — G4700 Insomnia, unspecified: Secondary | ICD-10-CM | POA: Diagnosis present

## 2023-04-26 DIAGNOSIS — E440 Moderate protein-calorie malnutrition: Secondary | ICD-10-CM | POA: Diagnosis present

## 2023-04-26 DIAGNOSIS — S0003XA Contusion of scalp, initial encounter: Secondary | ICD-10-CM | POA: Diagnosis present

## 2023-04-26 DIAGNOSIS — M1990 Unspecified osteoarthritis, unspecified site: Secondary | ICD-10-CM | POA: Diagnosis not present

## 2023-04-26 DIAGNOSIS — M1712 Unilateral primary osteoarthritis, left knee: Secondary | ICD-10-CM | POA: Diagnosis not present

## 2023-04-26 DIAGNOSIS — Z79890 Hormone replacement therapy (postmenopausal): Secondary | ICD-10-CM | POA: Diagnosis not present

## 2023-04-26 DIAGNOSIS — R2689 Other abnormalities of gait and mobility: Secondary | ICD-10-CM | POA: Diagnosis not present

## 2023-04-26 DIAGNOSIS — K219 Gastro-esophageal reflux disease without esophagitis: Secondary | ICD-10-CM | POA: Diagnosis present

## 2023-04-26 DIAGNOSIS — D62 Acute posthemorrhagic anemia: Secondary | ICD-10-CM | POA: Diagnosis not present

## 2023-04-26 DIAGNOSIS — M160 Bilateral primary osteoarthritis of hip: Secondary | ICD-10-CM | POA: Diagnosis not present

## 2023-04-26 DIAGNOSIS — K589 Irritable bowel syndrome without diarrhea: Secondary | ICD-10-CM | POA: Diagnosis not present

## 2023-04-26 DIAGNOSIS — M5136 Other intervertebral disc degeneration, lumbar region: Secondary | ICD-10-CM | POA: Diagnosis not present

## 2023-04-26 DIAGNOSIS — T796XXA Traumatic ischemia of muscle, initial encounter: Secondary | ICD-10-CM | POA: Diagnosis present

## 2023-04-26 DIAGNOSIS — R531 Weakness: Secondary | ICD-10-CM | POA: Diagnosis not present

## 2023-04-26 HISTORY — PX: INTRAMEDULLARY (IM) NAIL INTERTROCHANTERIC: SHX5875

## 2023-04-26 LAB — BASIC METABOLIC PANEL
Anion gap: 11 (ref 5–15)
BUN: 40 mg/dL — ABNORMAL HIGH (ref 8–23)
CO2: 22 mmol/L (ref 22–32)
Calcium: 8.6 mg/dL — ABNORMAL LOW (ref 8.9–10.3)
Chloride: 106 mmol/L (ref 98–111)
Creatinine, Ser: 1.75 mg/dL — ABNORMAL HIGH (ref 0.44–1.00)
GFR, Estimated: 28 mL/min — ABNORMAL LOW (ref >60)
Glucose, Bld: 120 mg/dL — ABNORMAL HIGH (ref 70–99)
Potassium: 4 mmol/L (ref 3.5–5.1)
Sodium: 139 mmol/L (ref 135–145)

## 2023-04-26 LAB — ECHOCARDIOGRAM COMPLETE
AR max vel: 2.06 cm2
AV Area VTI: 1.79 cm2
AV Area mean vel: 2.04 cm2
AV Mean grad: 4 mmHg
AV Peak grad: 8 mmHg
Ao pk vel: 1.41 m/s
Area-P 1/2: 3.3 cm2
Height: 64 in
S' Lateral: 2.5 cm
Weight: 2193.6 oz

## 2023-04-26 LAB — CBG MONITORING, ED: Glucose-Capillary: 109 mg/dL — ABNORMAL HIGH (ref 70–99)

## 2023-04-26 LAB — GLUCOSE, CAPILLARY: Glucose-Capillary: 123 mg/dL — ABNORMAL HIGH (ref 70–99)

## 2023-04-26 LAB — SURGICAL PCR SCREEN
MRSA, PCR: NEGATIVE
Staphylococcus aureus: NEGATIVE

## 2023-04-26 LAB — CK: Total CK: 2707 U/L — ABNORMAL HIGH (ref 38–234)

## 2023-04-26 SURGERY — FIXATION, FRACTURE, INTERTROCHANTERIC, WITH INTRAMEDULLARY ROD
Anesthesia: General | Laterality: Left

## 2023-04-26 MED ORDER — CEFAZOLIN SODIUM-DEXTROSE 2-4 GM/100ML-% IV SOLN
2.0000 g | Freq: Three times a day (TID) | INTRAVENOUS | Status: AC
  Administered 2023-04-27 (×2): 2 g via INTRAVENOUS
  Filled 2023-04-26 (×2): qty 100

## 2023-04-26 MED ORDER — ONDANSETRON HCL 4 MG/2ML IJ SOLN
INTRAMUSCULAR | Status: DC | PRN
  Administered 2023-04-26: 4 mg via INTRAVENOUS

## 2023-04-26 MED ORDER — OXYCODONE HCL 5 MG PO TABS
2.5000 mg | ORAL_TABLET | ORAL | Status: DC | PRN
  Administered 2023-04-27 – 2023-05-01 (×9): 5 mg via ORAL
  Filled 2023-04-26 (×10): qty 1

## 2023-04-26 MED ORDER — LEVOTHYROXINE SODIUM 50 MCG PO TABS
50.0000 ug | ORAL_TABLET | Freq: Every day | ORAL | Status: DC
  Administered 2023-04-27 – 2023-05-01 (×5): 50 ug via ORAL
  Filled 2023-04-26 (×5): qty 1

## 2023-04-26 MED ORDER — ENOXAPARIN SODIUM 40 MG/0.4ML IJ SOSY
40.0000 mg | PREFILLED_SYRINGE | INTRAMUSCULAR | Status: DC
  Administered 2023-04-27: 40 mg via SUBCUTANEOUS
  Filled 2023-04-26: qty 0.4

## 2023-04-26 MED ORDER — CEFAZOLIN SODIUM-DEXTROSE 2-4 GM/100ML-% IV SOLN
2.0000 g | INTRAVENOUS | Status: AC
  Administered 2023-04-26: 2 g via INTRAVENOUS

## 2023-04-26 MED ORDER — CEFAZOLIN SODIUM-DEXTROSE 2-4 GM/100ML-% IV SOLN
INTRAVENOUS | Status: AC
  Filled 2023-04-26: qty 100

## 2023-04-26 MED ORDER — OXYCODONE HCL 5 MG PO TABS: 2.5000 mg | ORAL_TABLET | ORAL | Status: DC | PRN

## 2023-04-26 MED ORDER — ACETAMINOPHEN 650 MG RE SUPP: 650.0000 mg | Freq: Four times a day (QID) | RECTAL | Status: DC | PRN

## 2023-04-26 MED ORDER — ACETAMINOPHEN 10 MG/ML IV SOLN: 1000.0000 mg | Freq: Once | INTRAVENOUS | Status: DC | PRN

## 2023-04-26 MED ORDER — NALOXONE HCL 0.4 MG/ML IJ SOLN: 0.4000 mg | INTRAMUSCULAR | Status: DC | PRN

## 2023-04-26 MED ORDER — ROCURONIUM BROMIDE 10 MG/ML (PF) SYRINGE
PREFILLED_SYRINGE | INTRAVENOUS | Status: DC | PRN
  Administered 2023-04-26: 50 mg via INTRAVENOUS

## 2023-04-26 MED ORDER — DEXAMETHASONE SODIUM PHOSPHATE 10 MG/ML IJ SOLN
INTRAMUSCULAR | Status: AC
  Filled 2023-04-26: qty 1

## 2023-04-26 MED ORDER — LACTATED RINGERS IV SOLN: INTRAVENOUS | Status: DC | PRN

## 2023-04-26 MED ORDER — ACETAMINOPHEN 10 MG/ML IV SOLN
INTRAVENOUS | Status: AC
  Filled 2023-04-26: qty 100

## 2023-04-26 MED ORDER — TRANEXAMIC ACID-NACL 1000-0.7 MG/100ML-% IV SOLN
INTRAVENOUS | Status: AC
  Filled 2023-04-26: qty 100

## 2023-04-26 MED ORDER — SODIUM CHLORIDE 0.9 % IV BOLUS
500.0000 mL | Freq: Once | INTRAVENOUS | Status: AC
  Administered 2023-04-26: 500 mL via INTRAVENOUS

## 2023-04-26 MED ORDER — TRANEXAMIC ACID-NACL 1000-0.7 MG/100ML-% IV SOLN
1000.0000 mg | INTRAVENOUS | Status: AC
  Administered 2023-04-26: 1000 mg via INTRAVENOUS

## 2023-04-26 MED ORDER — LIDOCAINE HCL (PF) 2 % IJ SOLN
INTRAMUSCULAR | Status: AC
  Filled 2023-04-26: qty 5

## 2023-04-26 MED ORDER — ACETAMINOPHEN 325 MG PO TABS: 325.0000 mg | ORAL_TABLET | Freq: Once | ORAL | Status: DC | PRN

## 2023-04-26 MED ORDER — VITAMIN B-12 100 MCG PO TABS
100.0000 ug | ORAL_TABLET | Freq: Every day | ORAL | Status: DC
  Administered 2023-04-26 – 2023-05-01 (×6): 100 ug via ORAL
  Filled 2023-04-26 (×7): qty 1

## 2023-04-26 MED ORDER — 0.9 % SODIUM CHLORIDE (POUR BTL) OPTIME
TOPICAL | Status: DC | PRN
  Administered 2023-04-26: 1000 mL

## 2023-04-26 MED ORDER — CHLORHEXIDINE GLUCONATE 4 % EX SOLN
60.0000 mL | Freq: Once | CUTANEOUS | Status: AC
  Administered 2023-04-26: 4 via TOPICAL
  Filled 2023-04-26: qty 60

## 2023-04-26 MED ORDER — PROPOFOL 10 MG/ML IV BOLUS
INTRAVENOUS | Status: DC | PRN
Start: 2023-04-26 — End: 2023-04-26
  Administered 2023-04-26: 80 mg via INTRAVENOUS

## 2023-04-26 MED ORDER — ROCURONIUM BROMIDE 10 MG/ML (PF) SYRINGE
PREFILLED_SYRINGE | INTRAVENOUS | Status: AC
  Filled 2023-04-26: qty 10

## 2023-04-26 MED ORDER — ONDANSETRON HCL 4 MG PO TABS
4.0000 mg | ORAL_TABLET | Freq: Four times a day (QID) | ORAL | Status: DC | PRN
  Administered 2023-04-27: 4 mg via ORAL
  Filled 2023-04-26: qty 1

## 2023-04-26 MED ORDER — SERTRALINE HCL 100 MG PO TABS
100.0000 mg | ORAL_TABLET | Freq: Every day | ORAL | Status: DC
  Administered 2023-04-26 – 2023-05-01 (×6): 100 mg via ORAL
  Filled 2023-04-26 (×4): qty 1
  Filled 2023-04-26: qty 2
  Filled 2023-04-26: qty 1

## 2023-04-26 MED ORDER — LACTATED RINGERS IV SOLN: INTRAVENOUS | Status: DC

## 2023-04-26 MED ORDER — PHENYLEPHRINE 80 MCG/ML (10ML) SYRINGE FOR IV PUSH (FOR BLOOD PRESSURE SUPPORT)
PREFILLED_SYRINGE | INTRAVENOUS | Status: DC | PRN
  Administered 2023-04-26: 200 ug via INTRAVENOUS
  Administered 2023-04-26 (×2): 160 ug via INTRAVENOUS

## 2023-04-26 MED ORDER — ACETAMINOPHEN 325 MG PO TABS
650.0000 mg | ORAL_TABLET | Freq: Four times a day (QID) | ORAL | Status: DC | PRN
  Administered 2023-04-28: 650 mg via ORAL
  Filled 2023-04-26: qty 2

## 2023-04-26 MED ORDER — POVIDONE-IODINE 10 % EX SWAB
2.0000 | Freq: Once | CUTANEOUS | Status: AC
  Administered 2023-04-26: 2 via TOPICAL

## 2023-04-26 MED ORDER — FENTANYL CITRATE (PF) 100 MCG/2ML IJ SOLN
INTRAMUSCULAR | Status: DC | PRN
  Administered 2023-04-26 (×2): 50 ug via INTRAVENOUS

## 2023-04-26 MED ORDER — FENTANYL CITRATE PF 50 MCG/ML IJ SOSY: 25.0000 ug | PREFILLED_SYRINGE | INTRAMUSCULAR | Status: DC | PRN

## 2023-04-26 MED ORDER — ACETAMINOPHEN 10 MG/ML IV SOLN
INTRAVENOUS | Status: DC | PRN
  Administered 2023-04-26: 1000 mg via INTRAVENOUS

## 2023-04-26 MED ORDER — MUPIROCIN 2 % EX OINT
1.0000 | TOPICAL_OINTMENT | Freq: Two times a day (BID) | CUTANEOUS | Status: AC
  Administered 2023-04-26 – 2023-05-01 (×10): 1 via NASAL
  Filled 2023-04-26: qty 22

## 2023-04-26 MED ORDER — OXYCODONE HCL 5 MG PO TABS: 5.0000 mg | ORAL_TABLET | Freq: Four times a day (QID) | ORAL | Status: DC | PRN

## 2023-04-26 MED ORDER — BUPIVACAINE HCL (PF) 0.25 % IJ SOLN
INTRAMUSCULAR | Status: DC | PRN
  Administered 2023-04-26: 50 mL

## 2023-04-26 MED ORDER — ACETAMINOPHEN 160 MG/5ML PO SOLN: 325.0000 mg | Freq: Once | ORAL | Status: DC | PRN

## 2023-04-26 MED ORDER — FENTANYL CITRATE (PF) 100 MCG/2ML IJ SOLN
INTRAMUSCULAR | Status: AC
  Filled 2023-04-26: qty 2

## 2023-04-26 MED ORDER — SODIUM CHLORIDE 0.9 % IV SOLN: INTRAVENOUS | Status: DC

## 2023-04-26 MED ORDER — HYDROMORPHONE HCL 1 MG/ML IJ SOLN
0.5000 mg | INTRAMUSCULAR | Status: DC | PRN
  Administered 2023-04-26: 0.5 mg via INTRAVENOUS
  Filled 2023-04-26: qty 1

## 2023-04-26 MED ORDER — LIDOCAINE 2% (20 MG/ML) 5 ML SYRINGE
INTRAMUSCULAR | Status: DC | PRN
  Administered 2023-04-26: 50 mg via INTRAVENOUS

## 2023-04-26 MED ORDER — PROPOFOL 10 MG/ML IV BOLUS
INTRAVENOUS | Status: AC
  Filled 2023-04-26: qty 20

## 2023-04-26 MED ORDER — ONDANSETRON HCL 4 MG/2ML IJ SOLN
4.0000 mg | Freq: Four times a day (QID) | INTRAMUSCULAR | Status: DC | PRN
  Administered 2023-04-30: 4 mg via INTRAVENOUS
  Filled 2023-04-26: qty 2

## 2023-04-26 MED ORDER — ONDANSETRON HCL 4 MG/2ML IJ SOLN
INTRAMUSCULAR | Status: AC
  Filled 2023-04-26: qty 2

## 2023-04-26 MED ORDER — OXYCODONE HCL 5 MG PO TABS: 2.5000 mg | ORAL_TABLET | Freq: Four times a day (QID) | ORAL | Status: DC | PRN

## 2023-04-26 MED ORDER — FENTANYL CITRATE PF 50 MCG/ML IJ SOSY
12.5000 ug | PREFILLED_SYRINGE | INTRAMUSCULAR | Status: DC | PRN
  Administered 2023-04-26: 12.5 ug via INTRAVENOUS
  Filled 2023-04-26: qty 1

## 2023-04-26 MED ORDER — SUGAMMADEX SODIUM 200 MG/2ML IV SOLN
INTRAVENOUS | Status: DC | PRN
  Administered 2023-04-26: 200 mg via INTRAVENOUS

## 2023-04-26 SURGICAL SUPPLY — 50 items
ADH SKN CLS APL DERMABOND .7 (GAUZE/BANDAGES/DRESSINGS) ×1
APL PRP STRL LF DISP 70% ISPRP (MISCELLANEOUS) ×1
BAG COUNTER SPONGE SURGICOUNT (BAG) ×1 IMPLANT
BAG SPNG CNTER NS LX DISP (BAG) ×1
BIT DRILL INTERTAN LAG SCREW (BIT) IMPLANT
CHLORAPREP W/TINT 26 (MISCELLANEOUS) ×1 IMPLANT
COVER MAYO STAND STRL (DRAPES) IMPLANT
COVER PERINEAL POST (MISCELLANEOUS) ×1 IMPLANT
COVER SURGICAL LIGHT HANDLE (MISCELLANEOUS) ×1 IMPLANT
DERMABOND ADVANCED .7 DNX12 (GAUZE/BANDAGES/DRESSINGS) IMPLANT
DRAPE C-ARM 42X120 X-RAY (DRAPES) ×1 IMPLANT
DRAPE C-ARMOR (DRAPES) ×1 IMPLANT
DRAPE STERI IOBAN 125X83 (DRAPES) ×1 IMPLANT
DRAPE U-SHAPE 47X51 STRL (DRAPES) ×2 IMPLANT
DRESSING MEPILEX FLEX 4X4 (GAUZE/BANDAGES/DRESSINGS) IMPLANT
DRSG MEPILEX FLEX 4X4 (GAUZE/BANDAGES/DRESSINGS)
DRSG TEGADERM 4X4.75 (GAUZE/BANDAGES/DRESSINGS) ×3 IMPLANT
ELECT REM PT RETURN 15FT ADLT (MISCELLANEOUS) IMPLANT
GAUZE SPONGE 4X4 12PLY STRL (GAUZE/BANDAGES/DRESSINGS) ×1 IMPLANT
GLOVE BIO SURGEON STRL SZ 6.5 (GLOVE) ×1 IMPLANT
GLOVE BIOGEL PI IND STRL 6.5 (GLOVE) ×1 IMPLANT
GLOVE BIOGEL PI IND STRL 8 (GLOVE) ×1 IMPLANT
GLOVE SURG ORTHO 8.0 STRL STRW (GLOVE) ×1 IMPLANT
GOWN STRL REUS W/ TWL LRG LVL3 (GOWN DISPOSABLE) ×1 IMPLANT
GOWN STRL REUS W/ TWL XL LVL3 (GOWN DISPOSABLE) ×2 IMPLANT
GOWN STRL REUS W/TWL LRG LVL3 (GOWN DISPOSABLE) ×1
GOWN STRL REUS W/TWL XL LVL3 (GOWN DISPOSABLE) ×2
GUIDE PIN 3.2X343 (PIN) ×2
GUIDE PIN 3.2X343MM (PIN) ×2
GUIDE ROD 3.0 (MISCELLANEOUS) ×1
KIT BASIN OR (CUSTOM PROCEDURE TRAY) ×1 IMPLANT
KIT TURNOVER KIT A (KITS) IMPLANT
MANIFOLD NEPTUNE II (INSTRUMENTS) ×1 IMPLANT
NAIL INTERTAN 10X18 130D 10S (Nail) IMPLANT
NDL HYPO 22X1.5 SAFETY MO (MISCELLANEOUS) IMPLANT
NEEDLE HYPO 22X1.5 SAFETY MO (MISCELLANEOUS) ×1 IMPLANT
NS IRRIG 1000ML POUR BTL (IV SOLUTION) ×1 IMPLANT
PACK GENERAL/GYN (CUSTOM PROCEDURE TRAY) ×1 IMPLANT
PIN GUIDE 3.2X343MM (PIN) IMPLANT
ROD GUIDE 3.0 (MISCELLANEOUS) IMPLANT
SCREW LAG COMPR KIT 100/95 (Screw) IMPLANT
SCREW TRIGEN LOW PROF 5.0X35 (Screw) IMPLANT
STRIP CLOSURE SKIN 1/2X4 (GAUZE/BANDAGES/DRESSINGS) ×1 IMPLANT
SUT MNCRL AB 3-0 PS2 18 (SUTURE) ×1 IMPLANT
SUT VIC AB 0 CT1 36 (SUTURE) ×1 IMPLANT
SUT VIC AB 1 CT1 36 (SUTURE) ×1 IMPLANT
SUT VIC AB 2-0 CT2 27 (SUTURE) ×2 IMPLANT
SYR 30ML LL (SYRINGE) IMPLANT
TOWEL OR 17X26 10 PK STRL BLUE (TOWEL DISPOSABLE) ×1 IMPLANT
TOWEL OR NON WOVEN STRL DISP B (DISPOSABLE) ×1 IMPLANT

## 2023-04-26 NOTE — Transfer of Care (Signed)
Immediate Anesthesia Transfer of Care Note  Patient: Jeanne Tran  Procedure(s) Performed: INTRAMEDULLARY (IM) NAIL INTERTROCHANTERIC (Left)  Patient Location: PACU  Anesthesia Type:General  Level of Consciousness: awake and patient cooperative  Airway & Oxygen Therapy: Patient Spontanous Breathing and Patient connected to face mask  Post-op Assessment: Report given to RN and Post -op Vital signs reviewed and stable  Post vital signs: Reviewed and stable  Last Vitals:  Vitals Value Taken Time  BP 128/80 04/26/23 2107  Temp    Pulse 89 04/26/23 2111  Resp 15 04/26/23 2111  SpO2 93 % 04/26/23 2111  Vitals shown include unfiled device data.  Last Pain:  Vitals:   04/26/23 1653  TempSrc: Oral  PainSc:          Complications: No notable events documented.

## 2023-04-26 NOTE — Progress Notes (Signed)
Patient received from PACU.    04/26/23 2202  Vitals  Temp (!) 97.3 F (36.3 C)  Temp Source Oral  BP (!) 142/71  MAP (mmHg) 92  BP Location Right Arm  BP Method Automatic  Patient Position (if appropriate) Lying  Pulse Rate 75  Pulse Rate Source Monitor  Resp 18  MEWS COLOR  MEWS Score Color Green  Oxygen Therapy  SpO2 93 %  O2 Device Nasal Cannula  O2 Flow Rate (L/min) 3 L/min

## 2023-04-26 NOTE — H&P (Signed)
History and Physical    Patient: Jeanne Tran ZOX:[id number] DOB: 31-Jan-1934 DOA: 04/25/2023 DOS: the patient was seen and examined on 04/26/2023 PCP: Madelin Headings, MD  Patient coming from: Home  Chief Complaint:  Chief Complaint  Patient presents with   Fall   HPI: Jeanne Tran is a 89 y.o. female with medical history significant of osteoarthritis, cancer status post left lumpectomy, restless legs syndrome, GERD esophageal stricture, colon polyps, ERCP, hyperlipidemia, migraine headaches, pulmonary nodule, adjustment disorder with anxious mood who had a mechanical fall at her skilled nursing facility.  She denied any prodromal symptoms. She denied fever, chills, rhinorrhea, sore throat, wheezing or hemoptysis.  No chest pain, palpitations, diaphoresis, PND, orthopnea or recent pitting edema of the lower extremities.  No abdominal pain, nausea, emesis, diarrhea, constipation, melena or hematochezia.  No flank pain, dysuria, frequency or hematuria.  No polyuria, polydipsia, polyphagia or blurred vision.   Lab work: CBC showed a white count 13.6, hemoglobin 12.0 g/dL platelets 811.  Normal PT and INR.  Total CK6 198 units/L.  CMP showed a glucose of 172, BUN 44 and creatinine 1.93 mg/dL.  The rest of the CMP measurements were normal after calcium correction.  Imaging: CT head without contrast show a small extracranial hematoma overlying the left occipital bone.  No evidence of calvarial fracture.  No acute intracranial abnormality.  CT cervical spine with no traumatic injury to the cervical spine.  C4-6 ACDF, without evidence of complications.  Hip x-ray shows comminuted intertrochanteric left hip fracture.  Portable chest radiograph with no acute cardiopulmonary pathology.  There is thoracic aortic atherosclerosis.  Heart size is normal.   ED course: Initial vital signs were temperature 97.6 F, pulse 90, respiration 14, BP 136/80 mmHg O2 sat 92% on room air.  Review of Systems: As  mentioned in the history of present illness. All other systems reviewed and are negative. Past Medical History:  Diagnosis Date   Arthritis    Breast cancer (HCC)    hx of left with radiation and lumpectomy   Diverticulosis    GERD (gastroesophageal reflux disease)    History of colonic polyps    History of ERCP    and sphincterotomy for abnormal lfts and dilated biliary ducts 5/05   Hyperlipidemia    Migraine headache    Pulmonary nodule    RLS (restless legs syndrome)    Wears contact lenses    Past Surgical History:  Procedure Laterality Date   ABDOMINAL HYSTERECTOMY     bso   APPENDECTOMY     BREAST LUMPECTOMY  1995   lt lumpsnbx   BREAST LUMPECTOMY WITH RADIOACTIVE SEED LOCALIZATION Left 07/01/2014   Procedure: LEFT BREAST LUMPECTOMY WITH RADIOACTIVE SEED LOCALIZATION;  Surgeon: Avel Peace, MD;  Location: [facility];  Service: General;  Laterality: Left;   CERVICAL SPINE SURGERY  3/03   CHOLECYSTECTOMY  2012   Complete Hysterectomy     CYSTOCELE REPAIR     ERCP     for abnormal lfts and dilated biliary ducts 5/05   gallbladder duct open  2006   HEMORRHOID SURGERY     HYSTEROSCOPY     LYMPH NODE BIOPSY     Orthoscopic Rt Knee     SPHINCTEROTOMY     for abnormal lfts and dilated biliary ducts 5/05   tonsillectomy     Social History:  reports that she quit smoking about 29 years ago. Her smoking use included cigarettes. She started smoking about 54 years  ago. She has a 75 pack-year smoking history. She has never used smokeless tobacco. She reports that she does not drink alcohol and does not use drugs.  Allergies  Allergen Reactions   Codeine Other (See Comments)    REACTION: Intolerance w/ pain meds not cough syrup. Codeine makes her bounce off the walls. falling REACTION: Intolerance w/ pain meds not cough syrup. Codeine makes her bounce off the walls.   Aspirin Other (See Comments)    GI issues GI issues    Family History  Problem  Relation Age of Onset   Mitral valve prolapse Son        with afib to have surgery   Esophageal cancer Neg Hx    Liver disease Neg Hx    Stomach cancer Neg Hx     Prior to Admission medications   Medication Sig Start Date End Date Taking? Authorizing Provider  CALCIUM PO Take 1 tablet by mouth daily.   Yes [provider]  levothyroxine (SYNTHROID) 50 MCG tablet TAKE 1 TABLET(50 MCG) BY MOUTH DAILY 03/24/23  Yes Panosh, Neta Mends, MD  Melatonin-Pyridoxine (MELATIN PO) Take 1 tablet by mouth at bedtime as needed (sleep).   Yes [provider]  mirtazapine (REMERON) 15 MG tablet TAKE 1 TO 2 TABLETS BY MOUTH AT BEDTIME FOR SLEEP Patient taking differently: Take 15 mg by mouth at bedtime. TAKE 1 TO 2 TABLETS BY MOUTH AT BEDTIME FOR SLEEP 10/29/22  Yes Panosh, Neta Mends, MD  sertraline (ZOLOFT) 100 MG tablet TAKE 1 TABLET(100 MG) BY MOUTH DAILY Patient taking differently: Take 100 mg by mouth daily. 12/16/22  Yes Worthy Rancher B, FNP  vitamin B-12 (CYANOCOBALAMIN) 100 MCG tablet Take 100 mcg by mouth daily.   Yes [provider]  zolpidem (AMBIEN) 10 MG tablet TAKE 1 TABLET BY MOUTH AT BEDTIME AS NEEDED FOR SLEEP Patient taking differently: Take 5 mg by mouth at bedtime as needed for sleep. 02/26/23  Yes Worthy Rancher B, FNP    Physical Exam: Vitals:   04/26/23 0239 04/26/23 0500 04/26/23 0530 04/26/23 0700  BP:  139/77 (!) 144/80 (!) 180/128  Pulse:  84 87 85  Resp:  11 13 14   Temp: 97.6 F (36.4 C)   97.8 F (36.6 C)  TempSrc:      SpO2:  98% 97% 97%  Weight:      Height:       Physical Exam Vitals and nursing note reviewed.  Constitutional:      General: She is awake. She is not in acute distress.    Appearance: Normal appearance.  HENT:     Head: Normocephalic and atraumatic.     Nose: No rhinorrhea.     Mouth/Throat:     Mouth: Mucous membranes are dry.  Eyes:     General: No scleral icterus.    Pupils: Pupils are equal, round, and reactive to light.   Neck:     Vascular: No JVD.  Cardiovascular:     Rate and Rhythm: Normal rate and regular rhythm.     Heart sounds: S1 normal and S2 normal.  Pulmonary:     Effort: Pulmonary effort is normal.     Breath sounds: Normal breath sounds. No wheezing, rhonchi or rales.  Abdominal:     General: Bowel sounds are normal.     Palpations: Abdomen is soft.     Tenderness: There is no abdominal tenderness.  Musculoskeletal:     Cervical back: Neck supple.  Left hip: Tenderness present. Decreased range of motion.     Right lower leg: No edema.     Left lower leg: No edema.  Skin:    General: Skin is warm and dry.  Neurological:     General: No focal deficit present.     Mental Status: She is alert. Mental status is at baseline.  Psychiatric:        Mood and Affect: Mood normal.        Behavior: Behavior normal. Behavior is cooperative.    Data Reviewed:  Results are pending, will review when available.  EKG: Vent. rate 95 BPM PR interval 162 ms QRS duration 88 ms QT/QTcB 358/450 ms P-R-T axes 76 -76 54 Sinus rhythm Left anterior fascicular block Probable left ventricular hypertrophy  Assessment and Plan: Principal Problem:   Closed left hip fracture, initial encounter (HCC) Admit to telemetry/inpatient. Ice area as needed. Buck's traction per protocol. Analgesics as needed. Antiemetics as needed. Consult TOC team. Consult nutritional services. PT evaluation after surgery. Orthopedic surgery evaluation appreciated.  Active Problems:   Rhabdomyolysis Gentle IV hydration. Follow-up total CK level.    GERD Antiacid, H2 blocker or PPI as needed.    Hyperlipidemia Not on medical therapy. Follow-up with PCP    Hypothyroidism Continue levothyroxine 50 mcg p.o. daily.    CKD (chronic kidney disease) stage 4, GFR 15-29 ml/min (HCC) Monitor renal function and electrolytes.    Hyperglycemia Check hemoglobin A1c.    Advance Care Planning:   Code Status: Full  Code   Consults: Orthopedic surgery Weber Cooks, MD)  Family Communication:   Severity of Illness: The appropriate patient status for this patient is INPATIENT. Inpatient status is judged to be reasonable and necessary in order to provide the required intensity of service to ensure the patient's safety. The patient's presenting symptoms, physical exam findings, and initial radiographic and laboratory data in the context of their chronic comorbidities is felt to place them at high risk for further clinical deterioration. Furthermore, it is not anticipated that the patient will be medically stable for discharge from the hospital within 2 midnights of admission.   * I certify that at the point of admission it is my clinical judgment that the patient will require inpatient hospital care spanning beyond 2 midnights from the point of admission due to high intensity of service, high risk for further deterioration and high frequency of surveillance required.*  Author: Bobette Mo, MD 04/26/2023 8:24 AM  For on call review [email or web address].   This document was prepared using Dragon voice recognition software and may contain some unintended transcription errors.

## 2023-04-26 NOTE — Anesthesia Preprocedure Evaluation (Addendum)
Anesthesia Evaluation  Patient identified by MRN, date of birth, ID band Patient awake    Reviewed: Allergy & Precautions, NPO status , Patient's Chart, lab work & pertinent test results  Airway Mallampati: I  TM Distance: >3 FB Neck ROM: Full    Dental  (+) Teeth Intact, Dental Advisory Given   Pulmonary former smoker   breath sounds clear to auscultation       Cardiovascular negative cardio ROS  Rhythm:Regular Rate:Normal     Neuro/Psych  Headaches PSYCHIATRIC DISORDERS         GI/Hepatic Neg liver ROS,GERD  ,,  Endo/Other  Hypothyroidism    Renal/GU Renal disease     Musculoskeletal  (+) Arthritis ,    Abdominal   Peds  Hematology negative hematology ROS (+)   Anesthesia Other Findings   Reproductive/Obstetrics                             Anesthesia Physical Anesthesia Plan  ASA: 3 and emergent  Anesthesia Plan: General   Post-op Pain Management: Ofirmev IV (intra-op)*   Induction: Intravenous  PONV Risk Score and Plan: 4 or greater and Ondansetron, Treatment may vary due to age or medical condition and TIVA  Airway Management Planned: Oral ETT  Additional Equipment: None  Intra-op Plan:   Post-operative Plan: Extubation in OR  Informed Consent: I have reviewed the patients History and Physical, chart, labs and discussed the procedure including the risks, benefits and alternatives for the proposed anesthesia with the patient or authorized representative who has indicated his/her understanding and acceptance.     Dental advisory given  Plan Discussed with: CRNA  Anesthesia Plan Comments:        Anesthesia Quick Evaluation

## 2023-04-26 NOTE — Anesthesia Procedure Notes (Signed)
Procedure Name: Intubation Date/Time: 04/26/2023 7:40 PM  Performed by: Vanessa Lamont, CRNAPre-anesthesia Checklist: Patient identified, Emergency Drugs available, Suction available and Patient being monitored Patient Re-evaluated:Patient Re-evaluated prior to induction Oxygen Delivery Method: Circle system utilized Preoxygenation: Pre-oxygenation with 100% oxygen Induction Type: IV induction Ventilation: Mask ventilation without difficulty Laryngoscope Size: 2 and Miller Grade View: Grade I Tube type: Oral Tube size: 7.0 mm Number of attempts: 1 Airway Equipment and Method: Stylet Placement Confirmation: ETT inserted through vocal cords under direct vision, positive ETCO2 and breath sounds checked- equal and bilateral Secured at: 21 cm Tube secured with: Tape Dental Injury: Teeth and Oropharynx as per pre-operative assessment

## 2023-04-26 NOTE — Discharge Instructions (Signed)
Orthopedic Discharge Instructions  Diet: As you were doing prior to hospitalization   Shower:  May shower but keep the wounds dry, use an occlusive plastic wrap, NO SOAKING IN TUB.  If the bandage gets wet, change with a clean dry gauze.  If you have a splint on, leave the splint in place and keep the splint dry with a plastic bag.  Dressing:  You may change your dressing 3-5 days after surgery, unless you have a splint.  If the dressing remains clean and dry it can also be left on until follow up. If you change the dressing replace with clean gauze and tape or ace wrap. If you have a splint, then just leave the splint in place and we will change your bandages during your first follow-up appointment.  If water gets in the splint or the splint gets saturated please call the clinic and we can see you to change your splint.  If you had hand or foot surgery, we will plan to remove your stitches in about 2 weeks in the office.  For all other surgeries, there are sticky tapes (steri-strips) on your wounds and all the stitches are absorbable.  Leave the steri-strips in place when changing your dressings, they will peel off with time, usually 2-3 weeks.  Activity:  Increase activity slowly as tolerated, but follow the weight bearing instructions below.  The rules on driving is that you can not be taking narcotics while you drive, and you must feel in control of the vehicle.    Weight Bearing:   Weightbearing as tolerated left leg.    Blood clot prevention (DVT Prophylaxis): After surgery you are at an increased risk for a blood clot. you were prescribed a blood thinner, lovenox 40mg , to be taken once daily for a total of 4 weeks from surgery to help reduce your risk of getting a blood clot. This will help prevent a blood clot. Signs of a pulmonary embolus (blood clot in the lungs) include sudden short of breath, feeling lightheaded or dizzy, chest pain with a deep breath, rapid pulse rapid breathing. Signs of  a blood clot in your arms or legs include new unexplained swelling and cramping, warm, red or darkened skin around the painful area. Please call the office or 911 right away if these signs or symptoms develop. To prevent constipation: you may use a stool softener such as -  Colace (over the counter) 100 mg by mouth twice a day  Drink plenty of fluids (prune juice may be helpful) and high fiber foods Miralax (over the counter) for constipation as needed.    Itching:  If you experience itching with your medications, try taking only a single pain pill, or even half a pain pill at a time.  You may take up to 10 pain pills per day, and you can also use benadryl over the counter for itching or also to help with sleep.   Precautions:  If you experience chest pain or shortness of breath - call 911 immediately for transfer to the hospital emergency department!!   Call office [phone]) for the following: Temperature greater than 101F Persistent nausea and vomiting Severe uncontrolled pain Redness, tenderness, or signs of infection (pain, swelling, redness, odor or green/yellow discharge around the site) Difficulty breathing, headache or visual disturbances Hives Persistent dizziness or light-headedness Extreme fatigue Any other questions or concerns you may have after discharge  In an emergency, call 911 or go to an Emergency Department at a nearby hospital  Follow- Up Appointment:  Please call for an appointment to be seen approximately 2-3 week after surgery in [facility] with your surgeon Dr. Weber Cooks - [phone] Address: [address], [place], [place] [address]

## 2023-04-26 NOTE — Op Note (Signed)
DATE OF SURGERY:  04/26/2023  TIME: 9:04 PM  PATIENT NAME:  Jeanne Tran  AGE: 89 y.o.  PRE-OPERATIVE DIAGNOSIS:  Left Intertrochanteric  Femur Fracture  POST-OPERATIVE DIAGNOSIS:  SAME  PROCEDURE:  INTRAMEDULLARY (IM) NAIL INTERTROCHANTERIC  SURGEON:   A   ASSISTANT:  none  OPERATIVE IMPLANTS:  [employer] Intertan Nail 10 x 180 mm , 100/95 lag compression screw, 1 distal interlock  Implant Name Type Inv. Item Serial No. Manufacturer Lot No. LRB No. Used Action  NAIL INTERTAN 10X18 130D 10S - [id number] Nail NAIL INTERTAN 10X18 130D 10S  [employer] [id number] Left 1 Implanted  SCREW LAG COMPR KIT 100/95 - [id number] Screw SCREW LAG COMPR KIT 100/95  [employer] [id number] Left 1 Implanted     ESTIMATED BLOOD LOSS: none  PREOPERATIVE INDICATIONS:  Jeanne Tran is a 89 y.o. year old who fell and suffered an Left Intertrochanteric  Femur Fracture. She was brought into the ER and then admitted and optimized and then elected for surgical intervention.    The risks benefits and alternatives were discussed with the patient including but not limited to the risks of nonoperative treatment, versus surgical intervention including infection, bleeding, nerve injury, malunion, nonunion, hardware prominence, hardware failure, need for hardware removal, blood clots, cardiopulmonary complications, morbidity, mortality, among others, and they were willing to proceed.    OPERATIVE PROCEDURE:  The patient was brought to the operating room and placed in the supine position. Anesthesia was administered. She was placed on the fracture table.  Closed reduction was performed under C-arm guidance.  Time out was then performed after sterile prep and drape. She received preoperative antibiotics.  Small incision proximal to the greater trochanter was made and carried down through skin and subcutaneous tissue.  Threaded guidewire was directed  at the tip of the greater trochanter and advanced into the proximal metaphysis.  Positioning was confirmed with fluoroscopy.  I then used an entry reamer to enter the medullary canal.  I then passed a 10 x 180 mm InterTAN down the center of the canal attached to the targeting arm.  I then used the targeting arm to make a percutaneous incision and directed a threaded guidewire up into the head/neck segment.  I confirmed adequate tip apex distance and measured the length.  I decided to place a 100 mm screw.  I then drilled the path for the compression screw and placed an antirotation bar.  I then placed the lag screw and then placed the compression screw and compressed approximately 5 mm.  The proximal portion of the nail was statically locked.   I used the targeting arm to place a distal interlocking screw.  The targeting arm was removed.  Final fluoroscopic imaging was obtained.    The wounds were irrigated copiously, and vancomycin powder was placed in the wounds.  The gluteal fascia was closed with 0 Vicryl, and skin was closed with 2-0 Vicryl and 3-0 Monocryl.  Sterile dressing was applied with Dermabond, 4 x 4 gauze, and Tegaderm.  The patient was awakened and returned to PACU in stable and satisfactory condition. There were no complications and the patient tolerated the procedure well.   Post op recs: WB: WBAT LLE Abx: ancef x23 hours post op Imaging: PACU xrays Dressing: keep intact until follow up, change PRN if soiled or saturated. DVT prophylaxis: lovenox starting POD1 x4 weeks Follow up: 2 weeks after surgery for a wound check with Dr. Blanchie Dessert at [facility].  Address: [address], [place], [place] [address]  Office Phone: [phone]  Weber Cooks, MD Orthopaedic Surgery

## 2023-04-26 NOTE — Consult Note (Signed)
ORTHOPAEDIC CONSULTATION  REQUESTING PHYSICIAN: Glynn Octave, MD  Chief Complaint: left hip fx  HPI: Jeanne Tran is a 89 y.o. female who female who lives independently sustained a fall at home.  Was unable to get up after the fall.  Was found down by her neighbor.  Localizes pain to the left hip and groin area.  Denies pain of the joints or extremities.  Denies any symptoms and tingling.    Past Medical History:  Diagnosis Date   Arthritis    Breast cancer (HCC)    hx of left with radiation and lumpectomy   Diverticulosis    GERD (gastroesophageal reflux disease)    History of colonic polyps    History of ERCP    and sphincterotomy for abnormal lfts and dilated biliary ducts 5/05   Hyperlipidemia    Migraine headache    Pulmonary nodule    RLS (restless legs syndrome)    Wears contact lenses    Past Surgical History:  Procedure Laterality Date   ABDOMINAL HYSTERECTOMY     bso   APPENDECTOMY     BREAST LUMPECTOMY  1995   lt lumpsnbx   BREAST LUMPECTOMY WITH RADIOACTIVE SEED LOCALIZATION Left 07/01/2014   Procedure: LEFT BREAST LUMPECTOMY WITH RADIOACTIVE SEED LOCALIZATION;  Surgeon: Avel Peace, MD;  Location: [facility];  Service: General;  Laterality: Left;   CERVICAL SPINE SURGERY  3/03   CHOLECYSTECTOMY  2012   Complete Hysterectomy     CYSTOCELE REPAIR     ERCP     for abnormal lfts and dilated biliary ducts 5/05   gallbladder duct open  2006   HEMORRHOID SURGERY     HYSTEROSCOPY     LYMPH NODE BIOPSY     Orthoscopic Rt Knee     SPHINCTEROTOMY     for abnormal lfts and dilated biliary ducts 5/05   tonsillectomy     Social History   Socioeconomic History   Marital status: Widowed    Spouse name: Not on file   Number of children: 3   Years of education: Not on file   Highest education level: Not on file  Occupational History   Occupation: retired  Tobacco Use   Smoking status: Former    Current packs/day: 0.00    Average  packs/day: 3.0 packs/day for 25.0 years (75.0 ttl pk-yrs)    Types: Cigarettes    Start date: 02/26/1969    Quit date: 02/26/1994    Years since quitting: 29.1   Smokeless tobacco: Never   Tobacco comments:    quit 24 years   Vaping Use   Vaping status: Never Used  Substance and Sexual Activity   Alcohol use: No   Drug use: No   Sexual activity: Not on file  Other Topics Concern   Not on file  Social History Narrative   Retired; widowed   2 sons   Plays bridge twice monthly   Still drives         Social Determinants of Health   Financial Resource Strain: Low Risk  (01/16/2022)   Overall Financial Resource Strain (CARDIA)    Difficulty of Paying Living Expenses: Not hard at all  Food Insecurity: No Food Insecurity (12/14/2021)   Hunger Vital Sign    Worried About Running Out of Food in the Last Year: Never true    Ran Out of Food in the Last Year: Never true  Transportation Needs: Unknown (12/14/2021)   PRAPARE - Transportation    Lack  of Transportation (Medical): No    Lack of Transportation (Non-Medical): Not on file  Physical Activity: Inactive (12/14/2021)   Exercise Vital Sign    Days of Exercise per Week: 0 days    Minutes of Exercise per Session: 0 min  Stress: No Stress Concern Present (12/14/2021)   [employer] of Occupational Health - Occupational Stress Questionnaire    Feeling of Stress : Not at all  Social Connections: Unknown (01/29/2022)   Received from [facility]   Social Network    Social Network: Not on file  Recent Concern: Social Connections - Socially Isolated (12/14/2021)   Social Connection and Isolation Panel [NHANES]    Frequency of Communication with Friends and Family: More than three times a week    Frequency of Social Gatherings with Friends and Family: More than three times a week    Attends Religious Services: Never    Database administrator or Organizations: No    Attends Banker Meetings: Never    Marital Status:  Widowed   Family History  Problem Relation Age of Onset   Mitral valve prolapse Son        with afib to have surgery   Esophageal cancer Neg Hx    Liver disease Neg Hx    Stomach cancer Neg Hx    Allergies  Allergen Reactions   Codeine Other (See Comments)    REACTION: Intolerance w/ pain meds not cough syrup. Codeine makes her bounce off the walls. falling REACTION: Intolerance w/ pain meds not cough syrup. Codeine makes her bounce off the walls.   Aspirin Other (See Comments)    GI issues GI issues     Positive ROS: All other systems have been reviewed and were otherwise negative with the exception of those mentioned in the HPI and as above.  Physical Exam: General: Alert, no acute distress Cardiovascular: No pedal edema Respiratory: No cyanosis, no use of accessory musculature Skin: No lesions in the area of chief complaint Neurologic: Sensation intact distally Psychiatric: Patient is competent for consent with normal mood and affect  MUSCULOSKELETAL:  LLENo traumatic wounds, ecchymosis, or rash             Tender about the hip             No knee or ankle effusion             Sens DPN, SPN, TN intact             Motor EHL, ext, flex 5/5             DP 2+, PT 2+, No significant edema   RLENo traumatic wounds, ecchymosis, or rash             Nontender             No groin pain with log roll             No knee or ankle effusion             Knee stable to varus/ valgus stress             Sens DPN, SPN, TN intact             Motor EHL, ext, flex 5/5             DP 2+, PT 2+, No significant edema     IMAGING: X-rays left hip and femur reviewed demonstrate displaced and shortened left intertrochanteric femur fracture  Assessment: Left displaced intertrochanteric femur fracture   Plan: I discussed with patient and family this morning that she has a displaced and shortened intertrochanteric femur fracture would benefit from open duction total fixation for  mobility and pain control.   The risks benefits and alternatives were discussed with the patient including but not limited to the risks of nonoperative treatment, versus surgical intervention including infection, bleeding, nerve injury, malunion, nonunion, the need for revision surgery, hardware prominence, hardware failure, the need for hardware removal, blood clots, cardiopulmonary complications, morbidity, mortality, among others, and they were willing to proceed.   Joen Laura, MD  Contact information:   [id number] 7am-5pm epic message Dr. Blanchie Dessert, or call office for patient follow up: [phone] After hours and holidays please check [email or web address] for group call information for [facility]

## 2023-04-26 NOTE — Progress Notes (Signed)
Xrays reviewed. Discussed with ED provider. Patient with L comminuted intertroch femur fracture.  Plan for ORIF tomorrow vs Sunday pending medical clearance and OR availability. Full consult note to follow in the AM.

## 2023-04-26 NOTE — ED Notes (Signed)
ED TO INPATIENT HANDOFF REPORT  ED Nurse Name and Phone #: Suzanna Obey [phone]  S Name/Age/Gender Jeanne Tran 89 y.o. female Room/Bed: WA20/WA20  Code Status   Code Status: Full Code  Home/SNF/Other Home Patient oriented to: self, place, and time Is this baseline? Yes   Triage Complete: Triage complete  Chief Complaint Closed left hip fracture, initial encounter (HCC) [S72.002A]  Triage Note Pt BIBA after having a mechanical fall per medics. Pt fell around 1730-1800 but wasn't able to get to fall alert access until about 2100. Per medic hematoma to back of head but controlled bleeding that ceased on its own. Left shortening outward rotation to left leg. Pain only w/ movement. Denies LOC, no blood thinners, or c/o headache. Remains A&O x4 during triage.   Allergies Allergies  Allergen Reactions   Codeine Other (See Comments)    REACTION: Intolerance w/ pain meds not cough syrup. Codeine makes her bounce off the walls. falling REACTION: Intolerance w/ pain meds not cough syrup. Codeine makes her bounce off the walls.   Aspirin Other (See Comments)    GI issues GI issues    Level of Care/Admitting Diagnosis ED Disposition     ED Disposition  Admit   Condition  --   Comment  Hospital Area: [facility] [[id number]]  Level of Care: Telemetry [5]  Admit to tele based on following criteria: Monitor for Ischemic changes  May admit patient to [facility] or [facility] if equivalent level of care is available:: No  Covid Evaluation: Asymptomatic - no recent exposure (last 10 days) testing not required  Diagnosis: Closed left hip fracture, initial encounter [facility]) [[id number]]  Admitting Physician: Bobette Mo [[id number]]  Attending Physician: Bobette Mo [[id number]]  Certification:: I certify this patient will need inpatient services for at least 2 midnights  Estimated Length of Stay: 2          B Medical/Surgery History Past Medical  History:  Diagnosis Date   Arthritis    Breast cancer (HCC)    hx of left with radiation and lumpectomy   Diverticulosis    GERD (gastroesophageal reflux disease)    History of colonic polyps    History of ERCP    and sphincterotomy for abnormal lfts and dilated biliary ducts 5/05   Hyperlipidemia    Migraine headache    Pulmonary nodule    RLS (restless legs syndrome)    Wears contact lenses    Past Surgical History:  Procedure Laterality Date   ABDOMINAL HYSTERECTOMY     bso   APPENDECTOMY     BREAST LUMPECTOMY  1995   lt lumpsnbx   BREAST LUMPECTOMY WITH RADIOACTIVE SEED LOCALIZATION Left 07/01/2014   Procedure: LEFT BREAST LUMPECTOMY WITH RADIOACTIVE SEED LOCALIZATION;  Surgeon: Avel Peace, MD;  Location: [facility];  Service: General;  Laterality: Left;   CERVICAL SPINE SURGERY  3/03   CHOLECYSTECTOMY  2012   Complete Hysterectomy     CYSTOCELE REPAIR     ERCP     for abnormal lfts and dilated biliary ducts 5/05   gallbladder duct open  2006   HEMORRHOID SURGERY     HYSTEROSCOPY     LYMPH NODE BIOPSY     Orthoscopic Rt Knee     SPHINCTEROTOMY     for abnormal lfts and dilated biliary ducts 5/05   tonsillectomy       A IV Location/Drains/Wounds Patient Lines/Drains/Airways Status     Active Line/Drains/Airways  Name Placement date Placement time Site Days   Peripheral IV 04/25/23 18 G Anterior;Right Forearm 04/25/23  2244  Forearm  1   External Urinary Catheter 04/26/23  0440  --  less than 1            Intake/Output Last 24 hours  Intake/Output Summary (Last 24 hours) at 04/26/2023 1555 Last data filed at 04/26/2023 1218 Gross per 24 hour  Intake 499 ml  Output --  Net 499 ml    Labs/Imaging Results for orders placed or performed during the hospital encounter of 04/25/23 (from the past 48 hour(s))  CBC with Differential     Status: Abnormal   Collection Time: 04/25/23 11:07 PM  Result Value Ref Range   WBC 13.6 (H) 4.0  - 10.5 K/uL   RBC 3.88 3.87 - 5.11 MIL/uL   Hemoglobin 12.0 12.0 - 15.0 g/dL   HCT 16.1 09.6 - 04.5 %   MCV 95.6 80.0 - 100.0 fL   MCH 30.9 26.0 - 34.0 pg   MCHC 32.3 30.0 - 36.0 g/dL   RDW 40.9 81.1 - 91.4 %   Platelets 181 150 - 400 K/uL   nRBC 0.0 0.0 - 0.2 %   Neutrophils Relative % 88 %   Neutro Abs 12.0 (H) 1.7 - 7.7 K/uL   Lymphocytes Relative 5 %   Lymphs Abs 0.6 (L) 0.7 - 4.0 K/uL   Monocytes Relative 6 %   Monocytes Absolute 0.8 0.1 - 1.0 K/uL   Eosinophils Relative 0 %   Eosinophils Absolute 0.0 0.0 - 0.5 K/uL   Basophils Relative 0 %   Basophils Absolute 0.1 0.0 - 0.1 K/uL   Immature Granulocytes 1 %   Abs Immature Granulocytes 0.07 0.00 - 0.07 K/uL    Comment: Performed at [facility], [address], [place], [place] [address]  Comprehensive metabolic panel     Status: Abnormal   Collection Time: 04/25/23 11:07 PM  Result Value Ref Range   Sodium 135 135 - 145 mmol/L   Potassium 3.7 3.5 - 5.1 mmol/L   Chloride 103 98 - 111 mmol/L   CO2 23 22 - 32 mmol/L   Glucose, Bld 172 (H) 70 - 99 mg/dL    Comment: Glucose reference range applies only to samples taken after fasting for at least 8 hours.   BUN 44 (H) 8 - 23 mg/dL   Creatinine, Ser 6.21 (H) 0.44 - 1.00 mg/dL   Calcium 8.7 (L) 8.9 - 10.3 mg/dL   Total Protein 6.6 6.5 - 8.1 g/dL   Albumin 3.8 3.5 - 5.0 g/dL   AST 37 15 - 41 U/L   ALT 26 0 - 44 U/L   Alkaline Phosphatase 103 38 - 126 U/L   Total Bilirubin 0.6 0.3 - 1.2 mg/dL   GFR, Estimated 25 (L) >60 mL/min    Comment: (NOTE) Calculated using the CKD-EPI Creatinine Equation (2021)    Anion gap 9 5 - 15    Comment: Performed at [facility], [address], [place], [place] [address]  Protime-INR     Status: None   Collection Time: 04/25/23 11:07 PM  Result Value Ref Range   Prothrombin Time 13.7 11.4 - 15.2 seconds   INR 1.0 0.8 - 1.2    Comment: (NOTE) INR goal varies based on device and disease  states. Performed at [facility], [address], [place], [place] [address]   CK     Status: Abnormal  Collection Time: 04/25/23 11:07 PM  Result Value Ref Range   Total CK 698 (H) 38 - 234 U/L    Comment: Performed at [facility], [address], [place], [place] [address]  CK     Status: Abnormal   Collection Time: 04/26/23  8:43 AM  Result Value Ref Range   Total CK 2,707 (H) 38 - 234 U/L    Comment: Performed at [facility], [address], [place], [place] [address]  Basic metabolic panel     Status: Abnormal   Collection Time: 04/26/23  8:43 AM  Result Value Ref Range   Sodium 139 135 - 145 mmol/L   Potassium 4.0 3.5 - 5.1 mmol/L   Chloride 106 98 - 111 mmol/L   CO2 22 22 - 32 mmol/L   Glucose, Bld 120 (H) 70 - 99 mg/dL    Comment: Glucose reference range applies only to samples taken after fasting for at least 8 hours.   BUN 40 (H) 8 - 23 mg/dL   Creatinine, Ser 4.40 (H) 0.44 - 1.00 mg/dL   Calcium 8.6 (L) 8.9 - 10.3 mg/dL   GFR, Estimated 28 (L) >60 mL/min    Comment: (NOTE) Calculated using the CKD-EPI Creatinine Equation (2021)    Anion gap 11 5 - 15    Comment: Performed at [facility], [address], [place], [place] [address]  CBG monitoring, ED     Status: Abnormal   Collection Time: 04/26/23 11:33 AM  Result Value Ref Range   Glucose-Capillary 109 (H) 70 - 99 mg/dL    Comment: Glucose reference range applies only to samples taken after fasting for at least 8 hours.   ECHOCARDIOGRAM COMPLETE  Result Date: 04/26/2023    ECHOCARDIOGRAM REPORT   Patient Name:   SAMYA ANGELL Date of Exam: 04/26/2023 Medical Rec #:  [id number]          Height:       64.0 in Accession #:    [id number]         Weight:       137.1 lb Date of Birth:  02/20/1934          BSA:          1.666 m Patient Age:    89 years           BP:           180/128 mmHg Patient Gender: F                  HR:           87  bpm. Exam Location:  Inpatient Procedure: 2D Echo, Color Doppler and Cardiac Doppler Indications:    Abnormal EKG, Preop eval  History:        Patient has no prior history of Echocardiogram examinations.                 Left hip fx, Arrythmias:Abnormal ECG; Risk Factors:Dyslipidemia.  Sonographer:    Milbert Coulter Referring Phys: [id number] DAVID MANUEL ORTIZ IMPRESSIONS  1. Left ventricular ejection fraction, by estimation, is 60 to 65%. The left ventricle has normal function. The left ventricle has no regional wall motion abnormalities. There is mild concentric left ventricular hypertrophy. Left ventricular diastolic parameters are consistent with Grade I diastolic dysfunction (impaired relaxation).  2. Right ventricular systolic function is normal. The right ventricular size is normal.  3. The mitral valve is normal in structure. Mild  to moderate mitral valve regurgitation. No evidence of mitral stenosis.  4. The aortic valve is grossly normal. There is mild calcification of the aortic valve. Aortic valve regurgitation is not visualized. Aortic valve sclerosis is present, with no evidence of aortic valve stenosis.  5. The inferior vena cava is normal in size with greater than 50% respiratory variability, suggesting right atrial pressure of 3 mmHg. Comparison(s): No prior Echocardiogram. FINDINGS  Left Ventricle: Left ventricular ejection fraction, by estimation, is 60 to 65%. The left ventricle has normal function. The left ventricle has no regional wall motion abnormalities. The left ventricular internal cavity size was normal in size. There is  mild concentric left ventricular hypertrophy. Left ventricular diastolic parameters are consistent with Grade I diastolic dysfunction (impaired relaxation). Right Ventricle: The right ventricular size is normal. No increase in right ventricular wall thickness. Right ventricular systolic function is normal. Left Atrium: Left atrial size was normal in size. Right Atrium: Right  atrial size was normal in size. Pericardium: There is no evidence of pericardial effusion. Mitral Valve: The mitral valve is normal in structure. Mild mitral annular calcification. Mild to moderate mitral valve regurgitation. No evidence of mitral valve stenosis. Tricuspid Valve: The tricuspid valve is normal in structure. Tricuspid valve regurgitation is not demonstrated. No evidence of tricuspid stenosis. Aortic Valve: The aortic valve is grossly normal. There is mild calcification of the aortic valve. Aortic valve regurgitation is not visualized. Aortic valve sclerosis is present, with no evidence of aortic valve stenosis. Aortic valve mean gradient measures 4.0 mmHg. Aortic valve peak gradient measures 8.0 mmHg. Aortic valve area, by VTI measures 1.79 cm. Pulmonic Valve: The pulmonic valve was normal in structure. Pulmonic valve regurgitation is not visualized. No evidence of pulmonic stenosis. Aorta: The aortic root and ascending aorta are structurally normal, with no evidence of dilitation. Venous: The inferior vena cava is normal in size with greater than 50% respiratory variability, suggesting right atrial pressure of 3 mmHg. IAS/Shunts: No atrial level shunt detected by color flow Doppler.  LEFT VENTRICLE PLAX 2D LVIDd:         3.70 cm   Diastology LVIDs:         2.50 cm   LV e' medial:    5.77 cm/s LV PW:         1.20 cm   LV E/e' medial:  10.7 LV IVS:        1.20 cm   LV e' lateral:   7.72 cm/s LVOT diam:     2.00 cm   LV E/e' lateral: 8.0 LV SV:         46 LV SV Index:   28 LVOT Area:     3.14 cm  RIGHT VENTRICLE RV Basal diam:  2.50 cm RV Mid diam:    1.90 cm RV S prime:     16.60 cm/s TAPSE (M-mode): 1.9 cm LEFT ATRIUM             Index        RIGHT ATRIUM          Index LA diam:        3.90 cm 2.34 cm/m   RA Area:     9.34 cm LA Vol (A2C):   35.9 ml 21.54 ml/m  RA Volume:   15.30 ml 9.18 ml/m LA Vol (A4C):   30.1 ml 18.06 ml/m LA Biplane Vol: 32.9 ml 19.74 ml/m  AORTIC VALVE AV Area (Vmax):     2.06 cm AV Area (  Vmean):   2.04 cm AV Area (VTI):     1.79 cm AV Vmax:           141.00 cm/s AV Vmean:          96.500 cm/s AV VTI:            0.260 m AV Peak Grad:      8.0 mmHg AV Mean Grad:      4.0 mmHg LVOT Vmax:         92.50 cm/s LVOT Vmean:        62.600 cm/s LVOT VTI:          0.148 m LVOT/AV VTI ratio: 0.57  AORTA Ao Root diam: 2.90 cm Ao Asc diam:  2.80 cm MITRAL VALVE MV Area (PHT): 3.30 cm     SHUNTS MV Decel Time: 230 msec     Systemic VTI:  0.15 m MV E velocity: 61.70 cm/s   Systemic Diam: 2.00 cm MV A velocity: 103.00 cm/s MV E/A ratio:  0.60 Riley Lam MD Electronically signed by Riley Lam MD Signature Date/Time: 04/26/2023/3:41:00 PM    Final    DG FEMUR MIN 2 VIEWS LEFT  Result Date: 04/26/2023 CLINICAL DATA:  [id number] Fracture [id number] EXAM: LEFT FEMUR 2 VIEWS COMPARISON:  X-ray left hip 04/25/2023 FINDINGS: Acute superiorly displaced and impacted left intertrochanteric fracture. Distally no acute femoral fracture. Least moderate degenerative changes of the medial tibiofemoral compartment. Knee grossly unremarkable for traumatic injury. Nonstandard views with the entire femoral shaft possibly not completely visualized. Soft tissues are unremarkable. IMPRESSION: 1. Acute superiorly displaced and impacted left intertrochanteric fracture. Distally no acute femoral fracture; however, nonstandard views with the entire femoral shaft possibly not completely visualized. 2. Knee grossly unremarkable for traumatic injury. Electronically Signed   By: Tish Frederickson M.D.   On: 04/26/2023 01:01   DG Chest 1 View  Result Date: 04/25/2023 CLINICAL DATA:  Fall, hip fracture EXAM: CHEST  1 VIEW COMPARISON:  07/17/2013 FINDINGS: Lungs are clear.  No pleural effusion or pneumothorax. The heart is normal in size.  Thoracic aortic atherosclerosis. Surgical clips along the left chest wall/axilla. IMPRESSION: No acute cardiopulmonary disease. Electronically Signed   By: Charline Bills M.D.    On: 04/25/2023 23:39   DG Hip Unilat W or Wo Pelvis 2-3 Views Left  Result Date: 04/25/2023 CLINICAL DATA:  Fall, hip fracture EXAM: DG HIP (WITH OR WITHOUT PELVIS) 2-3V LEFT COMPARISON:  None Available. FINDINGS: Comminuted intertrochanteric left hip fracture with foreshortening and varus angulation. Right hip is intact. Bilateral hip joint spaces are preserved. Visualized bony pelvis appears intact. Mild degenerative changes of the lower lumbar spine. IMPRESSION: Comminuted intertrochanteric left hip fracture, as above. Electronically Signed   By: Charline Bills M.D.   On: 04/25/2023 23:38   CT HEAD WO CONTRAST ( )  Result Date: 04/25/2023 CLINICAL DATA:  Fall, posterior head hematoma EXAM: CT HEAD WITHOUT CONTRAST CT CERVICAL SPINE WITHOUT CONTRAST TECHNIQUE: Multidetector CT imaging of the head and cervical spine was performed following the standard protocol without intravenous contrast. Multiplanar CT image reconstructions of the cervical spine were also generated. RADIATION DOSE REDUCTION: This exam was performed according to the departmental dose-optimization program which includes automated exposure control, adjustment of the mA and/or kV according to patient size and/or use of iterative reconstruction technique. COMPARISON:  None Available. FINDINGS: CT HEAD FINDINGS Brain: No evidence of acute infarction, hemorrhage, hydrocephalus, extra-axial collection or mass lesion/mass effect. Age related atrophy. Subcortical white matter and periventricular small  vessel ischemic changes. Vascular: Mild intracranial atherosclerosis. Skull: Normal. Negative for fracture or focal lesion. Sinuses/Orbits: The visualized paranasal sinuses are essentially clear. The mastoid air cells are unopacified. Other: Small extracranial hematoma overlying the left occipital bone (series 2/image 14). CT CERVICAL SPINE FINDINGS Alignment: Normal cervical lordosis. Skull base and vertebrae: No acute fracture. No primary bone  lesion or focal pathologic process. Soft tissues and spinal canal: No prevertebral fluid or swelling. No visible canal hematoma. Disc levels: C4-6 ACDF, without evidence of complication. Mild degenerative changes at C3-4 and C6-7. Spinal canal is patent. Upper chest: Visualized lung apices are notable for moderate centrilobular and paraseptal emphysematous changes. Calcified granuloma in the left lung apex, benign. Other: Visualized thyroid is unremarkable. IMPRESSION: Small extracranial hematoma overlying the left occipital bone. No evidence of calvarial fracture. No acute intracranial abnormality. Atrophy with small vessel ischemic changes. No traumatic injury to the cervical spine. C4-6 ACDF, without evidence of complication. Mild degenerative changes. Electronically Signed   By: Charline Bills M.D.   On: 04/25/2023 23:29   CT Cervical Spine Wo Contrast  Result Date: 04/25/2023 CLINICAL DATA:  Fall, posterior head hematoma EXAM: CT HEAD WITHOUT CONTRAST CT CERVICAL SPINE WITHOUT CONTRAST TECHNIQUE: Multidetector CT imaging of the head and cervical spine was performed following the standard protocol without intravenous contrast. Multiplanar CT image reconstructions of the cervical spine were also generated. RADIATION DOSE REDUCTION: This exam was performed according to the departmental dose-optimization program which includes automated exposure control, adjustment of the mA and/or kV according to patient size and/or use of iterative reconstruction technique. COMPARISON:  None Available. FINDINGS: CT HEAD FINDINGS Brain: No evidence of acute infarction, hemorrhage, hydrocephalus, extra-axial collection or mass lesion/mass effect. Age related atrophy. Subcortical white matter and periventricular small vessel ischemic changes. Vascular: Mild intracranial atherosclerosis. Skull: Normal. Negative for fracture or focal lesion. Sinuses/Orbits: The visualized paranasal sinuses are essentially clear. The mastoid air  cells are unopacified. Other: Small extracranial hematoma overlying the left occipital bone (series 2/image 14). CT CERVICAL SPINE FINDINGS Alignment: Normal cervical lordosis. Skull base and vertebrae: No acute fracture. No primary bone lesion or focal pathologic process. Soft tissues and spinal canal: No prevertebral fluid or swelling. No visible canal hematoma. Disc levels: C4-6 ACDF, without evidence of complication. Mild degenerative changes at C3-4 and C6-7. Spinal canal is patent. Upper chest: Visualized lung apices are notable for moderate centrilobular and paraseptal emphysematous changes. Calcified granuloma in the left lung apex, benign. Other: Visualized thyroid is unremarkable. IMPRESSION: Small extracranial hematoma overlying the left occipital bone. No evidence of calvarial fracture. No acute intracranial abnormality. Atrophy with small vessel ischemic changes. No traumatic injury to the cervical spine. C4-6 ACDF, without evidence of complication. Mild degenerative changes. Electronically Signed   By: Charline Bills M.D.   On: 04/25/2023 23:29    Pending Labs Unresulted Labs (From admission, onward)     Start     Ordered   04/27/23 0500  CBC  Tomorrow morning,   R        04/26/23 0827   04/27/23 0500  Comprehensive metabolic panel  Tomorrow morning,   R        04/26/23 0827   04/27/23 0500  CK  Daily,   R      04/26/23 1252   04/26/23 1338  Hemoglobin A1c  Add-on,   AD        04/26/23 1337   04/25/23 2252  Type and screen  Once,   STAT  04/25/23 2252            Vitals/Pain Today's Vitals   04/26/23 1030 04/26/23 1130 04/26/23 1300 04/26/23 1555  BP: 131/84  (!) 156/89 128/87  Pulse: 77  73 87  Resp: 18  12 15   Temp:  98 F (36.7 C)    TempSrc:      SpO2: 95%  97% 100%  Weight:      Height:      PainSc:        Isolation Precautions No active isolations  Medications Medications  acetaminophen (TYLENOL) tablet 650 mg (has no administration in time  range)    Or  acetaminophen (TYLENOL) suppository 650 mg (has no administration in time range)  ondansetron (ZOFRAN) tablet 4 mg (has no administration in time range)    Or  ondansetron (ZOFRAN) injection 4 mg (has no administration in time range)  0.9 %  sodium chloride infusion ( Intravenous New Bag/Given 04/26/23 1030)  fentaNYL (SUBLIMAZE) injection 12.5 mcg (has no administration in time range)  naloxone (NARCAN) injection 0.4 mg (has no administration in time range)  oxyCODONE (Oxy IR/ROXICODONE) immediate release tablet 2.5 mg (has no administration in time range)  levothyroxine (SYNTHROID) tablet 50 mcg (has no administration in time range)  sertraline (ZOLOFT) tablet 100 mg (100 mg Oral Given by Other 04/26/23 1413)  vitamin B-12 (CYANOCOBALAMIN) tablet 100 mcg (100 mcg Oral Given by Other 04/26/23 1413)  sodium chloride 0.9 % bolus 500 mL (0 mLs Intravenous Stopped 04/26/23 1218)    Mobility walks     Focused Assessments N/A   R Recommendations: See Admitting Provider Note  Report given to:   Additional Notes: N/A

## 2023-04-27 ENCOUNTER — Inpatient Hospital Stay (HOSPITAL_COMMUNITY): Payer: Medicare Other

## 2023-04-27 DIAGNOSIS — S72002A Fracture of unspecified part of neck of left femur, initial encounter for closed fracture: Secondary | ICD-10-CM | POA: Diagnosis not present

## 2023-04-27 LAB — GLUCOSE, CAPILLARY
Glucose-Capillary: 114 mg/dL — ABNORMAL HIGH (ref 70–99)
Glucose-Capillary: 127 mg/dL — ABNORMAL HIGH (ref 70–99)
Glucose-Capillary: 128 mg/dL — ABNORMAL HIGH (ref 70–99)
Glucose-Capillary: 151 mg/dL — ABNORMAL HIGH (ref 70–99)

## 2023-04-27 LAB — CK: Total CK: 2256 U/L — ABNORMAL HIGH (ref 38–234)

## 2023-04-27 LAB — CBC
HCT: 27.4 % — ABNORMAL LOW (ref 36.0–46.0)
Hemoglobin: 8.9 g/dL — ABNORMAL LOW (ref 12.0–15.0)
MCH: 31.6 pg (ref 26.0–34.0)
MCHC: 32.5 g/dL (ref 30.0–36.0)
MCV: 97.2 fL (ref 80.0–100.0)
Platelets: 162 10*3/uL (ref 150–400)
RBC: 2.82 MIL/uL — ABNORMAL LOW (ref 3.87–5.11)
RDW: 14.1 % (ref 11.5–15.5)
WBC: 11 10*3/uL — ABNORMAL HIGH (ref 4.0–10.5)
nRBC: 0 % (ref 0.0–0.2)

## 2023-04-27 LAB — COMPREHENSIVE METABOLIC PANEL WITH GFR
ALT: 36 U/L (ref 0–44)
AST: 85 U/L — ABNORMAL HIGH (ref 15–41)
Albumin: 3.1 g/dL — ABNORMAL LOW (ref 3.5–5.0)
Alkaline Phosphatase: 79 U/L (ref 38–126)
Anion gap: 8 (ref 5–15)
BUN: 36 mg/dL — ABNORMAL HIGH (ref 8–23)
CO2: 26 mmol/L (ref 22–32)
Calcium: 8.4 mg/dL — ABNORMAL LOW (ref 8.9–10.3)
Chloride: 100 mmol/L (ref 98–111)
Creatinine, Ser: 1.87 mg/dL — ABNORMAL HIGH (ref 0.44–1.00)
GFR, Estimated: 26 mL/min — ABNORMAL LOW (ref >60)
Glucose, Bld: 171 mg/dL — ABNORMAL HIGH (ref 70–99)
Potassium: 4.6 mmol/L (ref 3.5–5.1)
Sodium: 134 mmol/L — ABNORMAL LOW (ref 135–145)
Total Bilirubin: 0.5 mg/dL (ref 0.3–1.2)
Total Protein: 5.6 g/dL — ABNORMAL LOW (ref 6.5–8.1)

## 2023-04-27 MED ORDER — RENA-VITE PO TABS
1.0000 | ORAL_TABLET | Freq: Every day | ORAL | Status: DC
  Administered 2023-04-27 – 2023-04-29 (×3): 1 via ORAL
  Filled 2023-04-27 (×3): qty 1

## 2023-04-27 MED ORDER — ENSURE ENLIVE PO LIQD
237.0000 mL | Freq: Two times a day (BID) | ORAL | Status: DC
  Administered 2023-04-27 – 2023-05-01 (×8): 237 mL via ORAL

## 2023-04-27 NOTE — Progress Notes (Signed)
   ORTHOPAEDIC PROGRESS NOTE  s/p Procedure(s): INTRAMEDULLARY (IM) NAIL INTERTROCHANTERIC on 04/26/23 with DR. Marchwiany  SUBJECTIVE: Reports moderate pain about operative site when she moves her leg. No pain when she lays still.  No chest pain. No SOB. No nausea/vomiting. No other complaints.  OBJECTIVE: PE: General: resting comfortably in hospital bed, NAD LLE: incision CDI, leg lengths equal, intact EHL/TA/GSC, warm well perfused foot,   Vitals:   04/27/23 0251 04/27/23 0616  BP: 113/70 119/73  Pulse: 94 79  Resp: 18 18  Temp: 97.8 F (36.6 C) 97.6 F (36.4 C)  SpO2: 94% 96%     ASSESSMENT: Jeanne Tran is a 89 y.o. female POD#1  PLAN: Weightbearing: WBAT LLE Insicional and dressing care: Reinforce dressings as needed Orthopedic device(s): None Showering: Hold for now VTE prophylaxis: Lovenox Pain control: PRN pain medications, minimize narcotics as able Follow - up plan: 2 weeks after surgery for a wound check with Dr. Blanchie Dessert at [facility].  Dispo: TBD. PT/OT evals today.  Contact information: After hours and holidays please check [email or web address] for group call information for [facility]  Alfonse Alpers, PA-C 04/27/2023

## 2023-04-27 NOTE — Progress Notes (Signed)
PROGRESS NOTE  Jeanne Tran  DOB: 11-14-1933  PCP: Madelin Headings, MD ZOX:[id number]  DOA: 04/25/2023  LOS: 1 day  Hospital Day: 3  Brief narrative: Jeanne Tran is a 89 y.o. female with PMH significant for left breast cancer s/p lumpectomy-radiation, diverticulosis, colonic polyp, GERD, osteoarthritis, migraine, restless legs She is a long-term resident at a nursing facility 8/9, patient was brought to the ED after a mechanical fall, unable to get up or get any help for several hours.  EMS noted her awake, alert, had hematoma to the back of the head with controlled bleeding.  Noted shortening of left leg and brought to the ED for further evaluation  In the ED, patient was afebrile, hemodynamically stable, breathing on room air WBC count 13.6, CK 698, BUN/creatinine 44/1.93 CT scan of the head showed a small extracranial hematoma overlying the left occipital bone.  No acute intracranial abnormality.  No evidence of calvarial fracture. Hip x-ray showed comminuted intertrochanteric left hip fracture.   Portable chest radiograph with no acute cardiopulmonary pathology.  EDP discussed with orthopedic surgery Admitted to [facility]  Subjective: Patient was seen and examined this morning.  Pleasant elderly Caucasian female.  Confused, slow to respond but not restless or agitated.  Not in pain.  No family at bedside Chart reviewed Last set of blood work from this morning with WC count 11, hemoglobin 8.9, sodium 134, BUN/creatinine 36/1.87, CK2200  Assessment and plan: Closed left hip fracture S/p IM nailing - 8/10 Dr. Blanchie Dessert Fracture secondary to mechanical fall  Underwent surgical fixation 8/10 PT eval for mobility For pain management currently on as needed Tylenol and oxycodone DVT prophylaxis per orthopedics -currently on Lovenox subcu  Rhabdomyolysis Secondary to fall and being on the floor for several hours. CK level peaked at 2700.  Gradually downtrending with IV  hydration.  Continue to monitor Recent Labs  Lab 04/25/23 2307 04/26/23 0843 04/27/23 0353  CKTOTAL 698* 2,707* 2,256*    Acute blood loss anemia  hemoglobin normal at baseline.  Acutely down to 8.9 today secondary to blood loss from fracture and surgery. Continue to monitor.  Transfuse if less than 7.  Obtain type and screen for tomorrow just in case Recent Labs    04/25/23 2307 04/27/23 0353  HGB 12.0 8.9*  MCV 95.6 97.2   Hyponatremia Sodium level low at 130 with elevated continue monitor. Recent Labs  Lab 04/25/23 2307 04/26/23 0843 04/27/23 0353  NA 135 139 134*   CKD 4 Creatinine remains at baseline. Recent Labs    04/25/23 2307 04/26/23 0843 04/27/23 0353  BUN 44* 40* 36*  CREATININE 1.93* 1.75* 1.87*   GERD Antiacid, H2 blocker or PPI as needed.   Hypothyroidism Continue levothyroxine 50 mcg p.o. daily.  Migraine, restless legs Continue sertraline 100 mg daily, Ambien at bedtime  Mobility: PT to see today  Goals of care   Code Status: Full Code     DVT prophylaxis:  enoxaparin (LOVENOX) injection 40 mg Start: 04/27/23 0900 SCDs Start: 04/26/23 8119   Antimicrobials: None Fluid: NS at 75 mL/h Consultants: Orthopedics Family Communication: None  Status: Inpatient Level of care:  Telemetry   Patient is from: Nursing facility Needs to continue in-hospital care: Back to nursing facility Anticipated d/c to: If hemoglobin stable tomorrow, expect medical stability    Diet:  Diet Order             Diet regular Room service appropriate? Yes; Fluid consistency: Thin  Diet effective now  Scheduled Meds:  enoxaparin  40 mg Subcutaneous Q24H   feeding supplement  237 mL Oral BID BM   levothyroxine  50 mcg Oral Q0600   multivitamin  1 tablet Oral QHS   mupirocin ointment  1 Application Nasal BID   sertraline  100 mg Oral Daily   vitamin B-12  100 mcg Oral Daily    PRN meds: acetaminophen **OR** acetaminophen,  naLOXone (NARCAN)  injection, ondansetron **OR** ondansetron (ZOFRAN) IV, oxyCODONE   Infusions:   sodium chloride 75 mL/hr at 04/27/23 0746    Antimicrobials: Anti-infectives (From admission, onward)    Start     Dose/Rate Route Frequency Ordered Stop   04/27/23 0400  ceFAZolin (ANCEF) IVPB 2g/100 mL premix        2 g 200 mL/hr over 30 Minutes Intravenous Every 8 hours 04/26/23 2225 04/27/23 1323   04/26/23 1815  ceFAZolin (ANCEF) IVPB 2g/100 mL premix        2 g 200 mL/hr over 30 Minutes Intravenous On call to O.R. 04/26/23 1807 04/26/23 1952   04/26/23 1808  ceFAZolin (ANCEF) 2-4 GM/100ML-% IVPB       Note to Pharmacy: Myrlene Broker M: cabinet override      04/26/23 1808 04/26/23 1947       Nutritional status:  Body mass index is 23.53 kg/m.  Nutrition Problem: Increased nutrient needs Etiology: hip fracture Signs/Symptoms: estimated needs     Objective: Vitals:   04/27/23 1145 04/27/23 1146  BP: (!) 110/55   Pulse: 82 81  Resp: 18   Temp: 97.8 F (36.6 C)   SpO2: (!) 89% 95%    Intake/Output Summary (Last 24 hours) at 04/27/2023 1428 Last data filed at 04/27/2023 0746 Gross per 24 hour  Intake 2397.2 ml  Output 1350 ml  Net 1047.2 ml   Filed Weights   04/25/23 2252  Weight: 62.2 kg   Weight change:  Body mass index is 23.53 kg/m.   Physical Exam: General exam: Pleasant, elderly Caucasian female.  Not in distress.  Pain controlled Skin: No rashes, lesions or ulcers. HEENT: Atraumatic, normocephalic, no obvious bleeding Lungs: Clear to auscultation bilaterally CVS: Regular rate and rhythm, no murmur GI/Abd soft, nontender, nondistended, bowel sound present CNS: Alert, awake, confused, slow to respond, not restless or agitated Psychiatry: Mood appropriate Extremities: No pedal edema, no calf tenderness  Data Review: I have personally reviewed the laboratory data and studies available.  F/u labs ordered Unresulted Labs (From admission, onward)      Start     Ordered   04/28/23 0500  CBC with Differential/Platelet  Daily,   R     Question:  Specimen collection method  Answer:  Lab=Lab collect   04/27/23 0841   04/28/23 0500  Basic metabolic panel  Daily,   R     Question:  Specimen collection method  Answer:  Lab=Lab collect   04/27/23 0841   04/28/23 0500  Type and screen  Once,   R        04/27/23 0841   04/27/23 0500  CK  Daily,   R      04/26/23 1252   04/26/23 1338  Hemoglobin A1c  Add-on,   AD        04/26/23 1337            Total time spent in review of labs and imaging, patient evaluation, formulation of plan, documentation and communication with family: 45 minutes  Signed, Lorin Glass, MD [facility] 04/27/2023

## 2023-04-27 NOTE — Plan of Care (Signed)
°  Problem: Activity: °Goal: Risk for activity intolerance will decrease °Outcome: Progressing °  °Problem: Elimination: °Goal: Will not experience complications related to bowel motility °Outcome: Progressing °Goal: Will not experience complications related to urinary retention °Outcome: Progressing °  °

## 2023-04-27 NOTE — Progress Notes (Signed)
Initial Nutrition Assessment  DOCUMENTATION CODES:   Not applicable  INTERVENTION:  - Add Ensure Enlive po BID, each supplement provides 350 kcal and 20 grams of protein.  - Add Renal MVI q day.   NUTRITION DIAGNOSIS:   Increased nutrient needs related to hip fracture as evidenced by estimated needs.  GOAL:   Patient will meet greater than or equal to 90% of their needs  MONITOR:   PO intake, Supplement acceptance  REASON FOR ASSESSMENT:   Consult Hip fracture protocol  ASSESSMENT:   89 y.o. female admits related to fall. PMH includes: arthritis, breast cancer, diverticulosis, GERD, ERCP, HLD. Pt is currently receiving medical management related to left hip fracture.  Meds reviewed:  Vit B12. Labs reviewed: Na low, BUN/Creatinine elevated.   RD attempted to call pt's room but no answer. No intakes documented at this time. No significant wt loss per record. Pt with increased nutrient needs related to hip fracture. RD will add Ensure BID for now. Will continue to monitor PO intakes.   NUTRITION - FOCUSED PHYSICAL EXAM:  Remote assessment.  Diet Order:   Diet Order             Diet regular Room service appropriate? Yes; Fluid consistency: Thin  Diet effective now                   EDUCATION NEEDS:   Not appropriate for education at this time  Skin:  Skin Assessment: Skin Integrity Issues: Skin Integrity Issues:: Incisions Incisions: L hip  Last BM:  04/25/23  Height:   Ht Readings from Last 1 Encounters:  04/25/23 5\' 4"  (1.626 m)    Weight:   Wt Readings from Last 1 Encounters:  04/25/23 62.2 kg    Ideal Body Weight:     BMI:  Body mass index is 23.53 kg/m.  Estimated Nutritional Needs:   Kcal:  1800-2100 kcals  Protein:  90-100 gm  Fluid:  >/= 1.8 L  Bethann Humble, RD, LDN, CNSC.

## 2023-04-27 NOTE — Plan of Care (Signed)
  Problem: Education: Goal: Knowledge of General Education information will improve Description: Including pain rating scale, medication(s)/side effects and non-pharmacologic comfort measures Outcome: Not Progressing   Problem: Health Behavior/Discharge Planning: Goal: Ability to manage health-related needs will improve Outcome: Not Progressing   

## 2023-04-27 NOTE — Evaluation (Signed)
Physical Therapy Evaluation Patient Details Name: Jeanne Tran MRN: [id number] DOB: 1934-04-02 Today's Date: 04/27/2023  History of Present Illness  89 yo female admitted with L hip fx. S/P IM nailing 04/26/23. CT(+) head contusion, occipital hematoma. Hx of breast Ca, OA, RA, renal insufficiency, RLS, anxiety, migraines, esophageal stricture  Clinical Impression  On 1st attempt, unable to awaken pt. Pt would open eyes and stare off into space;did not speak--made RN aware. Returned to eval pt after RN assessed pt (Rn reported pt's O2 was in 70s-pt not wearing [place]).  On eval, pt required Max A for bed mobility. Pt sat EOB for a few minutes before requesting to return to supine. Pt had difficulty processing, following commands. She seemed generally confused throughout session. She seemed fearful and anxious. Pt stated during session "I think I may have had a stroke". She was able to move bil UEs. She had a good grip on both sides. She responded appropriately, for the most part, to my questions but she still appeared generally confused. Updated RN. Will plan to follow and progress activity as tolerated. Patient will benefit from continued inpatient follow up therapy, <3 hours/day       If plan is discharge home, recommend the following: Two people to help with bathing/dressing/bathroom;Two people to help with walking and/or transfers   Can travel by private vehicle   No    Equipment Recommendations None recommended by PT  Recommendations for Other Services       Functional Status Assessment Patient has had a recent decline in their functional status and demonstrates the ability to make significant improvements in function in a reasonable and predictable amount of time.     Precautions / Restrictions Precautions Precautions: Fall Restrictions Weight Bearing Restrictions: No LLE Weight Bearing: Weight bearing as tolerated      Mobility  Bed Mobility Overal bed mobility: Needs  Assistance Bed Mobility: Supine to Sit, Sit to Supine     Supine to sit: HOB elevated, Max assist Sit to supine: HOB elevated, Max assist   General bed mobility comments: Assist for trunk and bil LEs. Utilized bedpad to aid with positioning. Increased time. Max repeated verbal and tactile cueing. Pt appeared fearful. Inconsistent command following. Poor static sitting balance with pt leaning posteriorly and to R side. Pt only able to tolerate sitting EOB for a few minutes before requesting to return to supine. She had difficulty processing even with cueing/instruction. She denied dizziness, denied pain. Assisted pt back into supine-assess vitals-see flowsheet.    Transfers                   General transfer comment: Nt-unable to safely attempt on today    Ambulation/Gait                  Stairs            Wheelchair Mobility     Tilt Bed    Modified Rankin (Stroke Patients Only)       Balance                                             Pertinent Vitals/Pain Pain Assessment Pain Assessment: Faces Faces Pain Scale: Hurts whole lot Pain Location: L LE with activity Pain Descriptors / Indicators: Grimacing, Operative site guarding Pain Intervention(s): Limited activity within patient's tolerance, Monitored during session, Repositioned  Home Living Family/patient expects to be discharged to:: Skilled nursing facility Living Arrangements: Alone   Type of Home: House Home Access: Stairs to enter       Home Layout: One level Home Equipment: Agricultural consultant (2 wheels) Additional Comments: unsure of accuracy of info-pt appears confused at times    Prior Function Prior Level of Function : Independent/Modified Independent                     Extremity/Trunk Assessment   Upper Extremity Assessment Upper Extremity Assessment: Defer to OT evaluation    Lower Extremity Assessment Lower Extremity Assessment:  (unable to  asess 2* cognition, pain)    Cervical / Trunk Assessment Cervical / Trunk Assessment: Normal  Communication   Communication Communication: No apparent difficulties  Cognition Arousal: Alert (but will easily close eyes and drift back off) Behavior During Therapy: Anxious Overall Cognitive Status: No family/caregiver present to determine baseline cognitive functioning Area of Impairment: Orientation, Following commands, Problem solving                 Orientation Level: Disoriented to, Time     Following Commands: Follows one step commands inconsistently     Problem Solving: Slow processing, Decreased initiation, Requires verbal cues, Requires tactile cues, Difficulty sequencing General Comments: pt stated year was 69.        General Comments      Exercises     Assessment/Plan    PT Assessment Patient needs continued PT services  PT Problem List Decreased strength;Decreased range of motion;Decreased activity tolerance;Decreased balance;Decreased mobility;Decreased cognition;Decreased knowledge of use of DME;Pain       PT Treatment Interventions DME instruction;Gait training;Balance training;Functional mobility training;Therapeutic activities;Therapeutic exercise;Stair training;Patient/family education    PT Goals (Current goals can be found in the Care Plan section)  Acute Rehab PT Goals Patient Stated Goal: none stated PT Goal Formulation: Patient unable to participate in goal setting Time For Goal Achievement: 05/11/23 Potential to Achieve Goals: Good    Frequency Min 1X/week     Co-evaluation               AM-PAC PT "6 Clicks" Mobility  Outcome Measure Help needed turning from your back to your side while in a flat bed without using bedrails?: Total Help needed moving from lying on your back to sitting on the side of a flat bed without using bedrails?: Total Help needed moving to and from a bed to a chair (including a wheelchair)?: Total Help  needed standing up from a chair using your arms (e.g., wheelchair or bedside chair)?: Total Help needed to walk in hospital room?: Total Help needed climbing 3-5 steps with a railing? : Total 6 Click Score: 6    End of Session   Activity Tolerance: Patient limited by pain (limited by cognition) Patient left: in bed;with call bell/phone within reach;with bed alarm set        Time: 8295-6213 PT Time Calculation (min) (ACUTE ONLY): 25 min   Charges:   PT Evaluation $PT Eval Low Complexity: 1 Low PT Treatments $Therapeutic Activity: 8-22 mins PT General Charges $$ ACUTE PT VISIT: 1 Visit            Faye Ramsay, PT Acute Rehabilitation  Office: [phone]

## 2023-04-28 ENCOUNTER — Encounter (HOSPITAL_COMMUNITY): Payer: Self-pay | Admitting: Orthopedic Surgery

## 2023-04-28 DIAGNOSIS — S72002A Fracture of unspecified part of neck of left femur, initial encounter for closed fracture: Secondary | ICD-10-CM | POA: Diagnosis not present

## 2023-04-28 LAB — ABO/RH: ABO/RH(D): O POS

## 2023-04-28 LAB — GLUCOSE, CAPILLARY
Glucose-Capillary: 133 mg/dL — ABNORMAL HIGH (ref 70–99)
Glucose-Capillary: 122 mg/dL — ABNORMAL HIGH (ref 70–99)
Glucose-Capillary: 106 mg/dL — ABNORMAL HIGH (ref 70–99)

## 2023-04-28 LAB — HEMOGLOBIN A1C
Hgb A1c MFr Bld: 6.2 % — ABNORMAL HIGH (ref 4.8–5.6)
Mean Plasma Glucose: 131 mg/dL

## 2023-04-28 MED ORDER — ZOLPIDEM TARTRATE 5 MG PO TABS
5.0000 mg | ORAL_TABLET | Freq: Once | ORAL | Status: AC
  Administered 2023-04-29: 5 mg via ORAL
  Filled 2023-04-28: qty 1

## 2023-04-28 MED ORDER — ENOXAPARIN SODIUM 30 MG/0.3ML IJ SOSY
30.0000 mg | PREFILLED_SYRINGE | INTRAMUSCULAR | Status: DC
  Administered 2023-04-28 – 2023-04-29 (×2): 30 mg via SUBCUTANEOUS
  Filled 2023-04-28: qty 0.3
  Filled 2023-04-28: qty 0.4

## 2023-04-28 NOTE — Progress Notes (Signed)
    2 Days Post-Op Procedure(s) (LRB): INTRAMEDULLARY (IM) NAIL INTERTROCHANTERIC (Left)  Subjective:  Patient reports pain as mild.  Just waking up, slept okay.  Unable to mobilized much with PT yesterday.  Appears motivated today.  No reports of chest pain, SOB, N/V, numbness or tingling.  Would like to go back to sleep.  Objective:   VITALS:   Vitals:   04/27/23 1145 04/27/23 1146 04/27/23 2127 04/28/23 0435  BP: (!) 110/55  (!) 111/55 126/67  Pulse: 82 81 86 78  Resp: 18  19 19   Temp: 97.8 F (36.6 C)  98.3 F (36.8 C) 97.8 F (36.6 C)  TempSrc: Oral  Oral Oral  SpO2: (!) 89% 95% 93% 97%  Weight:      Height:        AAOx3, mildly groggy appearing waking up from sleep Neurovascular intact Sensation intact distally Intact pulses distally Dorsiflexion/Plantar flexion intact Incision: dressing C/D/I Compartment soft Wiggles toes appropriately   Lab Results  Component Value Date   WBC 10.8 (H) 04/28/2023   HGB 7.9 (L) 04/28/2023   HCT 25.1 (L) 04/28/2023   MCV 98.4 04/28/2023   PLT 152 04/28/2023   BMET    Component Value Date/Time   NA 134 (L) 04/28/2023 0356   NA 141 04/17/2017 1504   K 4.1 04/28/2023 0356   K 5.0 04/17/2017 1504   CL 103 04/28/2023 0356   CO2 25 04/28/2023 0356   CO2 28 04/17/2017 1504   GLUCOSE 127 (H) 04/28/2023 0356   GLUCOSE 91 04/17/2017 1504   BUN 31 (H) 04/28/2023 0356   BUN 27.3 (H) 04/17/2017 1504   CREATININE 1.60 (H) 04/28/2023 0356   CREATININE 1.74 (H) 07/05/2020 1256   CREATININE 1.6 (H) 04/17/2017 1504   CALCIUM 8.0 (L) 04/28/2023 0356   CALCIUM 10.1 04/17/2017 1504   EGFR 31 (L) 04/17/2017 1504   GFRNONAA 31 (L) 04/28/2023 0356   GFRNONAA 26 (L) 07/05/2020 1256     Xray: stable post-op imaging  Assessment/Plan: 2 Days Post-Op   Principal Problem:   Closed left hip fracture, initial encounter (HCC) Active Problems:   GERD   Hyperlipidemia   Rhabdomyolysis   Hypothyroidism   CKD (chronic kidney  disease) stage 4, GFR 15-29 ml/min (HCC)   Hyperglycemia  WBC 10.8* (08/12 0356) likely post-operative leukocytosis HGB  7.9* (08/12 0356), trending down -- continue to monitor, currently appears asymptomatic.  May consider transfusion and/or withholding anticoagulant if needed.   Post op recs: WB: WBAT LLE Abx: ancef x23 hours post op Imaging: PACU xrays Dressing: keep intact until follow up, change PRN if soiled or saturated. DVT prophylaxis: lovenox starting POD1 x4 weeks Follow up: 2 weeks after surgery for a wound check with Dr. Blanchie Dessert at [facility].  Address: [address], [place], [place] [address]  Office Phone: [phone]    Cecil Cobbs 04/28/2023, 7:10 AM   Weber Cooks, MD  Contact information:   [phone] 7am-5pm epic message Dr. Blanchie Dessert, or call office for patient follow up: [phone] After hours and holidays please check [email or web address] for group call information for [facility]

## 2023-04-28 NOTE — Anesthesia Postprocedure Evaluation (Signed)
Anesthesia Post Note  Patient: Jeanne Tran  Procedure(s) Performed: INTRAMEDULLARY (IM) NAIL INTERTROCHANTERIC (Left)     Patient location during evaluation: PACU Anesthesia Type: General Level of consciousness: awake and alert Pain management: pain level controlled Vital Signs Assessment: post-procedure vital signs reviewed and stable Respiratory status: spontaneous breathing, nonlabored ventilation, respiratory function stable and patient connected to nasal cannula oxygen Cardiovascular status: blood pressure returned to baseline and stable Postop Assessment: no apparent nausea or vomiting Anesthetic complications: no   No notable events documented.  Last Vitals:  Vitals:   04/27/23 2127 04/28/23 0435  BP: (!) 111/55 126/67  Pulse: 86 78  Resp: 19 19  Temp: 36.8 C 36.6 C  SpO2: 93% 97%    Last Pain:  Vitals:   04/28/23 0504  TempSrc:   PainSc: Asleep                 Shelton Silvas

## 2023-04-28 NOTE — TOC Initial Note (Addendum)
Transition of Care [facility]) - Initial/Assessment Note    Patient Details  Name: Jeanne Tran MRN: [id number] Date of Birth: 01-12-1934  Transition of Care [facility]) CM/SW Contact:    Larrie Kass, LCSW Phone Number: 04/28/2023, 2:28 PM  Clinical Narrative:                 CSW spoke with pt's son Lyda Perone to discuss rec for short-term rehab. Pt's son stated he believes pt would benefit from SNF. CSW explained the process to placement, pt would not need insurance auth. Pt son reports that he would like to speak with pt to discuss placement and wishes CSW to start the process. CSW to work pt up for SNF placement. TOC to follow.   Adden 4:19pm Pt's PASRR requesting additional clinicals. Documents have been submitted. TOC to follow.   Expected Discharge Plan: Skilled Nursing Facility Barriers to Discharge: Continued Medical Work up   Patient Goals and CMS Choice            Expected Discharge Plan and Services In-house Referral: Clinical Social Work     Living arrangements for the past 2 months: Single Family Home                                      Prior Living Arrangements/Services Living arrangements for the past 2 months: Single Family Home Lives with:: Self   Do you feel safe going back to the place where you live?: Yes      Need for Family Participation in Patient Care: Yes (Comment) Care giver support system in place?: Yes (comment)      Activities of Daily Living Home Assistive Devices/Equipment: Walker (specify type) (front wheel) ADL Screening (condition at time of admission) Patient's cognitive ability adequate to safely complete daily activities?: Yes Is the patient deaf or have difficulty hearing?: No Does the patient have difficulty seeing, even when wearing glasses/contacts?: No Does the patient have difficulty concentrating, remembering, or making decisions?: No Patient able to express need for assistance with ADLs?: Yes Does the patient  have difficulty dressing or bathing?: Yes Independently performs ADLs?: No Communication: Independent Dressing (OT): Needs assistance Is this a change from baseline?: Change from baseline, expected to last <3days Grooming: Needs assistance Is this a change from baseline?: Change from baseline, expected to last <3 days Feeding: Independent Bathing: Needs assistance Is this a change from baseline?: Change from baseline, expected to last <3 days Toileting: Needs assistance Is this a change from baseline?: Change from baseline, expected to last <3 days In/Out Bed: Needs assistance Is this a change from baseline?: Change from baseline, expected to last <3 days Walks in Home: Independent with device (comment) Does the patient have difficulty walking or climbing stairs?: Yes Weakness of Legs: Left Weakness of Arms/Hands: None  Permission Sought/Granted                  Emotional Assessment Appearance:: Appears stated age Attitude/Demeanor/Rapport: Gracious Affect (typically observed): Accepting Orientation: : Oriented to Self   Psych Involvement: No (comment)  Admission diagnosis:  Closed fracture of left hip, initial encounter (HCC) [S72.002A] Fall, initial encounter [W19.XXXA] Closed left hip fracture, initial encounter [facility]) [S72.002A] Patient Active Problem List   Diagnosis Date Noted   Closed left hip fracture, initial encounter (HCC) 04/26/2023   Compression fracture of L1 lumbar vertebra (HCC) 04/26/2023   Rhabdomyolysis 04/26/2023   Hypothyroidism 04/26/2023  CKD (chronic kidney disease) stage 4, GFR 15-29 ml/min (HCC) 04/26/2023   Hyperglycemia 04/26/2023   Rheumatoid arthritis (HCC) 04/30/2020   Osteopenia 10/01/2016   Ductal carcinoma in situ (DCIS) of left breast 05/09/2015   Multiple joint pain 10/18/2014   GI symptoms 10/18/2014   Hyperlipidemia 10/18/2014   Chronic renal insufficiency 09/26/2014   Swelling of both hands 09/26/2014   Elevated serum  creatinine 08/14/2014   Medication management 08/14/2014   Dry skin dermatitis 05/18/2013   Medicare annual wellness visit, subsequent 04/27/2012   Abnormal TSH 04/27/2012   High risk medication use 10/20/2011   History of ERCP    TINGLING 03/06/2010   Osteoarthritis 10/27/2009   FECAL OCCULT BLOOD 02/21/2009   ESOPHAGEAL STRICTURE 08/04/2008   DIVERTICULOSIS OF COLON 08/04/2008   IRRITABLE BOWEL SYNDROME 08/04/2008   HIP PAIN, RIGHT 07/06/2008   NECK PAIN 07/06/2008   BACK PAIN, RIGHT 07/06/2008   HEADACHE 07/06/2008   ADJUSTMENT DISORDER WITH ANXIOUS MOOD 08/31/2007   HYPERLIPIDEMIA 06/01/2007   INSOMNIA, CHRONIC 06/01/2007   GERD 05/28/2007   BREAST CANCER, HX OF 05/28/2007   COLONIC POLYPS, HX OF 05/28/2007   PCP:  Madelin Headings, MD Pharmacy:   [id number] - [place], [place] - [address] [place] [address] Phone: [phone] Fax: [phone]  [id number] [place],  - [address] AT [facility] [address] [facility] [address] & [facility] [address] [place] [address] Phone: [phone] Fax: [phone]  [facility] [place], [place] - [address] [place] [address] [place] [address] Phone: [phone] Fax: [phone]  [id number] - [place], [place] - [address] [place] [address] Phone: [phone] Fax: [phone]  [id number] [place], [place] - [address] [place] [address] Phone: [phone] Fax: [phone]     Social Determinants of Health (SDOH) Social History: SDOH Screenings   Food Insecurity: No Food Insecurity (04/26/2023)  Housing: Low Risk  (04/26/2023)  Transportation Needs: No Transportation Needs (04/26/2023)  Utilities: Not At Risk (04/26/2023)  Alcohol Screen: Low Risk  (12/14/2021)  Depression (PHQ2-9): Low Risk  (12/24/2022)  Financial  Resource Strain: Low Risk  (01/16/2022)  Physical Activity: Inactive (12/14/2021)  Social Connections: Unknown (01/29/2022)   Received from [facility]  Recent Concern: Social Connections - Socially Isolated (12/14/2021)  Stress: No Stress Concern Present (12/14/2021)  Tobacco Use: Medium Risk (04/25/2023)   SDOH Interventions:     Readmission Risk Interventions     No data to display

## 2023-04-28 NOTE — [place] FL2 (Signed)
[place] MEDICAID FL2 LEVEL OF CARE FORM     IDENTIFICATION  Patient Name: Jeanne Tran Birthdate: June 01, 1934 Sex: female Admission Date (Current Location): 04/25/2023  [facility] and [place] Number:  Producer, television/film/video and Address:  [facility],  [address], [place] [address]      Provider Number: [id number]  Attending Physician Name and Address:  Dorcas Carrow, MD  Relative Name and Phone Number:  Lorrine Kin [phone])  [phone] (Work Phone    Current Level of Care: Hospital Recommended Level of Care: Skilled Nursing Facility Prior Approval Number:    Date Approved/Denied:   PASRR Number: pending  Discharge Plan: SNF    Current Diagnoses: Patient Active Problem List   Diagnosis Date Noted   Closed left hip fracture, initial encounter (HCC) 04/26/2023   Compression fracture of L1 lumbar vertebra (HCC) 04/26/2023   Rhabdomyolysis 04/26/2023   Hypothyroidism 04/26/2023   CKD (chronic kidney disease) stage 4, GFR 15-29 ml/min (HCC) 04/26/2023   Hyperglycemia 04/26/2023   Rheumatoid arthritis (HCC) 04/30/2020   Osteopenia 10/01/2016   Ductal carcinoma in situ (DCIS) of left breast 05/09/2015   Multiple joint pain 10/18/2014   GI symptoms 10/18/2014   Hyperlipidemia 10/18/2014   Chronic renal insufficiency 09/26/2014   Swelling of both hands 09/26/2014   Elevated serum creatinine 08/14/2014   Medication management 08/14/2014   Dry skin dermatitis 05/18/2013   Medicare annual wellness visit, subsequent 04/27/2012   Abnormal TSH 04/27/2012   High risk medication use 10/20/2011   History of ERCP    TINGLING 03/06/2010   Osteoarthritis 10/27/2009   FECAL OCCULT BLOOD 02/21/2009   ESOPHAGEAL STRICTURE 08/04/2008   DIVERTICULOSIS OF COLON 08/04/2008   IRRITABLE BOWEL SYNDROME 08/04/2008   HIP PAIN, RIGHT 07/06/2008   NECK PAIN 07/06/2008   BACK PAIN, RIGHT 07/06/2008   HEADACHE 07/06/2008   ADJUSTMENT DISORDER WITH ANXIOUS MOOD 08/31/2007    HYPERLIPIDEMIA 06/01/2007   INSOMNIA, CHRONIC 06/01/2007   GERD 05/28/2007   BREAST CANCER, HX OF 05/28/2007   COLONIC POLYPS, HX OF 05/28/2007    Orientation RESPIRATION BLADDER Height & Weight     Self, Situation  O2 (3L) Incontinent, External catheter Weight: 137 lb 1.6 oz (62.2 kg) Height:  5\' 4"  (162.6 cm)  BEHAVIORAL SYMPTOMS/MOOD NEUROLOGICAL BOWEL NUTRITION STATUS      Continent Diet (regular)  AMBULATORY STATUS COMMUNICATION OF NEEDS Skin   Limited Assist Verbally Other (Comment) (Closed incision left hip)                       Personal Care Assistance Level of Assistance  Bathing, Feeding, Dressing Bathing Assistance: Limited assistance Feeding assistance: Independent Dressing Assistance: Limited assistance     Functional Limitations Info  Sight, Hearing, Speech Sight Info: Adequate Hearing Info: Adequate Speech Info: Adequate    SPECIAL CARE FACTORS FREQUENCY  PT (By licensed PT), OT (By licensed OT)     PT Frequency: 5 x a week OT Frequency: 5 x a week            Contractures Contractures Info: Not present    Additional Factors Info  Code Status, Allergies Code Status Info: full Allergies Info: Codeine, Aspirin           Current Medications (04/28/2023):  This is the current hospital active medication list Current Facility-Administered Medications  Medication Dose Route Frequency Provider Last Rate Last Admin   acetaminophen (TYLENOL) tablet 650 mg  650 mg Oral Q6H PRN Joen Laura,  MD   650 mg at 04/28/23 0419   Or   acetaminophen (TYLENOL) suppository 650 mg  650 mg Rectal Q6H PRN Joen Laura, MD       enoxaparin (LOVENOX) injection 30 mg  30 mg Subcutaneous Q24H Dorcas Carrow, MD   30 mg at 04/28/23 1018   feeding supplement (ENSURE ENLIVE / ENSURE PLUS) liquid 237 mL  237 mL Oral BID BM Dahal, Melina Schools, MD   237 mL at 04/28/23 1413   levothyroxine (SYNTHROID) tablet 50 mcg  50 mcg Oral Q0600 Joen Laura, MD    50 mcg at 04/28/23 0526   multivitamin (RENA-VIT) tablet 1 tablet  1 tablet Oral QHS Lorin Glass, MD   1 tablet at 04/27/23 2147   mupirocin ointment (BACTROBAN) 2 % 1 Application  1 Application Nasal BID Joen Laura, MD   1 Application at 04/28/23 1019   naloxone (NARCAN) injection 0.4 mg  0.4 mg Intravenous PRN Joen Laura, MD       ondansetron [facility]) tablet 4 mg  4 mg Oral Q6H PRN Joen Laura, MD   4 mg at 04/27/23 2352   Or   ondansetron (ZOFRAN) injection 4 mg  4 mg Intravenous Q6H PRN Joen Laura, MD       oxyCODONE (Oxy IR/ROXICODONE) immediate release tablet 2.5-5 mg  2.5-5 mg Oral Q4H PRN Joen Laura, MD   5 mg at 04/28/23 0419   sertraline (ZOLOFT) tablet 100 mg  100 mg Oral Daily Joen Laura, MD   100 mg at 04/28/23 1018   vitamin B-12 (CYANOCOBALAMIN) tablet 100 mcg  100 mcg Oral Daily Joen Laura, MD   100 mcg at 04/28/23 1019     Discharge Medications: Please see discharge summary for a list of discharge medications.  Relevant Imaging Results:  Relevant Lab Results:   Additional Information [id number]  Valentina Shaggy , LCSW

## 2023-04-28 NOTE — Plan of Care (Signed)
  Problem: Education: Goal: Knowledge of General Education information will improve Description: Including pain rating scale, medication(s)/side effects and non-pharmacologic comfort measures Outcome: Not Progressing   

## 2023-04-28 NOTE — Evaluation (Signed)
Occupational Therapy Evaluation Patient Details Name: Jeanne Tran MRN: [id number] DOB: November 29, 1933 Today's Date: 04/28/2023   History of Present Illness Patient is a 89 year old female admitted with L hip fx. S/P IM nailing 04/26/23. CT(+) head contusion, occipital hematoma. Hx of breast Ca, OA, RA, renal insufficiency, RLS, anxiety, migraines, esophageal stricture   Clinical Impression   Patient is a 89 year old female who was admitted for above. Patient presented today with increased pain and confusion limiting participation in session. Patient agreeable to advancement to EOB but then consistently asking for bed to be made more comfortable with each small movement towards EOB. Patient resistive to further advancement and returned to supine at end of session. Patient was noted to have decreased functional activity tolernace, decreased ROM, decreased BUE strength, decreased endurance, decreased sitting balance, decreased standing balanced, decreased safety awareness, and decreased knowledge of AE/AD impacting participation in ADLs. Patient will benefit from continued inpatient follow up therapy, <3 hours/day        If plan is discharge home, recommend the following: Two people to help with walking and/or transfers;A lot of help with bathing/dressing/bathroom;Assistance with cooking/housework;Direct supervision/assist for medications management;Assist for transportation;Help with stairs or ramp for entrance;Direct supervision/assist for financial management;Supervision due to cognitive status    Functional Status Assessment  Patient has had a recent decline in their functional status and/or demonstrates limited ability to make significant improvements in function in a reasonable and predictable amount of time  Equipment Recommendations          Precautions / Restrictions Precautions Precautions: Fall Restrictions Weight Bearing Restrictions: Yes LLE Weight Bearing: Weight bearing as  tolerated      Mobility Bed Mobility Overal bed mobility: Needs Assistance Bed Mobility: Supine to Sit     Supine to sit: Max assist                    ADL either performed or assessed with clinical judgement   ADL Overall ADL's : Needs assistance/impaired     Grooming: Bed level;Maximal assistance   Upper Body Bathing: Bed level;Maximal assistance   Lower Body Bathing: Bed level;Maximal assistance   Upper Body Dressing : Bed level;Maximal assistance   Lower Body Dressing: Bed level;Maximal assistance     Toilet Transfer Details (indicate cue type and reason): patient unable to advance to EOB with patient participating in minimal movement towards EOB then requesting that pillow be adjusted under head. patient was educated that plan was to sit on EOB. patient reproted " do whatever you want" patient was provided with multimodal cues for advancement of feet to EOB with patient able to get RLE off EOB. patient upon getting L heel to EOB continuously asked for blanktes to be placed back on her even with education provided on how this was therapy and goal was to sit EOB and blankets would not allow this to happen. patient returned to bed with BLE on bed and blankets back inplace as per patient request. Toileting- Clothing Manipulation and Hygiene: Bed level;Total assistance               Vision   Additional Comments: patient keeping eyes closed during most of session appearing to be lethargic but answering with alert voice.            Pertinent Vitals/Pain Pain Assessment Pain Assessment: Faces Faces Pain Scale: Hurts worst Pain Location: L LE with activity Pain Descriptors / Indicators: Grimacing, Operative site guarding Pain Intervention(s): Limited activity within patient's  tolerance, Monitored during session, Repositioned     Extremity/Trunk Assessment Upper Extremity Assessment Upper Extremity Assessment: Difficult to assess due to impaired cognition    Lower Extremity Assessment Lower Extremity Assessment: Defer to PT evaluation       Communication Communication Communication: No apparent difficulties   Cognition Arousal: Lethargic Behavior During Therapy: Flat affect Overall Cognitive Status: No family/caregiver present to determine baseline cognitive functioning         General Comments: patient agreeable to sit EOB but continued to ask for things to make her positioning more comfortable in bed.                Home Living Family/patient expects to be discharged to:: Skilled nursing facility Living Arrangements: Alone   Type of Home: House Home Access: Stairs to enter     Home Layout: One level               Home Equipment: Agricultural consultant (2 wheels)   Additional Comments: unsure of accuracy of info-pt appears confused at times      Prior Functioning/Environment Prior Level of Function : Independent/Modified Independent                        OT Problem List: Decreased activity tolerance;Impaired balance (sitting and/or standing);Decreased coordination;Decreased safety awareness;Decreased knowledge of precautions;Decreased knowledge of use of DME or AE      OT Treatment/Interventions: Self-care/ADL training;Energy conservation;DME and/or AE instruction;Therapeutic exercise;Therapeutic activities;Patient/family education;Balance training    OT Goals(Current goals can be found in the care plan section) Acute Rehab OT Goals Patient Stated Goal: none stated OT Goal Formulation: Patient unable to participate in goal setting Time For Goal Achievement: 05/12/23 Potential to Achieve Goals: Fair  OT Frequency: Min 1X/week       AM-PAC OT "6 Clicks" Daily Activity     Outcome Measure Help from another person eating meals?: A Lot Help from another person taking care of personal grooming?: A Lot Help from another person toileting, which includes using toliet, bedpan, or urinal?: Total Help from  another person bathing (including washing, rinsing, drying)?: Total Help from another person to put on and taking off regular upper body clothing?: Total Help from another person to put on and taking off regular lower body clothing?: Total 6 Click Score: 8   End of Session Nurse Communication: Other (comment) (ok to participate)  Activity Tolerance: Patient limited by pain;Patient limited by fatigue;Patient limited by lethargy Patient left: in bed;with call bell/phone within reach;with bed alarm set  OT Visit Diagnosis: Unsteadiness on feet (R26.81);Other abnormalities of gait and mobility (R26.89);Pain                Time: 4132-4401 OT Time Calculation (min): 12 min Charges:  OT General Charges $OT Visit: 1 Visit OT Evaluation $OT Eval Low Complexity: 1 Low   OTR/L, MS Acute Rehabilitation Department Office# [phone]   Selinda Flavin 04/28/2023, 2:18 PM

## 2023-04-28 NOTE — Progress Notes (Signed)
PROGRESS NOTE    MINHA AWWAD  ZOX:[id number] DOB: December 05, 1933 DOA: 04/25/2023 PCP: Madelin Headings, MD    Brief Narrative:  89 year old with history of breast cancer, GERD, migraine, restless leg syndrome brought to the ER from her assisted living facility with fall, unable to call for help and on the floor for some time.  In the emergency room skeletal survey including CT scan of the head with a small extracranial hematoma left occipital bone.  Left hip fracture.  CK level elevated.   Assessment & Plan:   Closed traumatic intertrochanteric fracture of the left hip: Status post IM nailing 8/10 Weightbearing as tolerated Pain medications, Tylenol and oxycodone along with bowel regimen. DVT prophylaxis, Lovenox for 4 weeks. Postop follow-up as per surgery.  Rhabdomyolysis: Presented with CK level of 2700.  Downtrending.  Discontinue IV fluids today as levels have adequately improved and she is able to keep up oral hydration  Acute blood loss anemia, surgical and dilutional.  Hemoglobin 8-7.9.  Currently no indication for transfusion.  Chronic medical issues including CKD stage IV, at baseline Hypothyroidism, on levothyroxine Restless leg syndrome, on sertraline 100 mg daily, Ambien at night   DVT prophylaxis: enoxaparin (LOVENOX) injection 30 mg Start: 04/28/23 0900 SCDs Start: 04/26/23 8119   Code Status: Full code Family Communication: None at the bedside Disposition Plan: Status is: Inpatient Remains inpatient appropriate because: Immediate postop.  Needs SNF.     Consultants:  Orthopedics  Procedures:  Left hip ORIF  Antimicrobials:  None   Subjective: Seen in the morning rounds.  Denies any complaints.  She could not participate well with physical therapy yesterday, however she did get out of the bed with nursing staff.  Agreeable to work with rehab people today.  Pain is controlled.  Objective: Vitals:   04/27/23 1146 04/27/23 2127 04/28/23 0435  04/28/23 1234  BP:  (!) 111/55 126/67 (!) 107/56  Pulse: 81 86 78 79  Resp:  19 19 14   Temp:  98.3 F (36.8 C) 97.8 F (36.6 C) 98.1 F (36.7 C)  TempSrc:  Oral Oral Oral  SpO2: 95% 93% 97% 97%  Weight:      Height:        Intake/Output Summary (Last 24 hours) at 04/28/2023 1316 Last data filed at 04/28/2023 1100 Gross per 24 hour  Intake 2298.88 ml  Output 250 ml  Net 2048.88 ml   Filed Weights   04/25/23 2252  Weight: 62.2 kg    Examination:  General exam: Appears calm and comfortable at rest. Patient is mostly alert awake and oriented.  Not in any distress. Respiratory system: No added sounds. Cardiovascular system: S1 & S2 heard, RRR.  Gastrointestinal system: Soft.  Nontender.  Bowel sound present.   Left lateral thigh incision clean and dry.  Data Reviewed: I have personally reviewed following labs and imaging studies  CBC: Recent Labs  Lab 04/25/23 2307 04/27/23 0353 04/28/23 0356  WBC 13.6* 11.0* 10.8*  NEUTROABS 12.0*  --  8.2*  HGB 12.0 8.9* 7.9*  HCT 37.1 27.4* 25.1*  MCV 95.6 97.2 98.4  PLT 181 162 152   Basic Metabolic Panel: Recent Labs  Lab 04/25/23 2307 04/26/23 0843 04/27/23 0353 04/28/23 0356  NA 135 139 134* 134*  K 3.7 4.0 4.6 4.1  CL 103 106 100 103  CO2 23 22 26 25   GLUCOSE 172* 120* 171* 127*  BUN 44* 40* 36* 31*  CREATININE 1.93* 1.75* 1.87* 1.60*  CALCIUM 8.7* 8.6* 8.4* 8.0*  GFR: Estimated Creatinine Clearance: 21.4 mL/min (A) (by C-G formula based on SCr of 1.6 mg/dL (H)). Liver Function Tests: Recent Labs  Lab 04/25/23 2307 04/27/23 0353  AST 37 85*  ALT 26 36  ALKPHOS 103 79  BILITOT 0.6 0.5  PROT 6.6 5.6*  ALBUMIN 3.8 3.1*   No results for input(s): "LIPASE", "AMYLASE" in the last 168 hours. No results for input(s): "AMMONIA" in the last 168 hours. Coagulation Profile: Recent Labs  Lab 04/25/23 2307  INR 1.0   Cardiac Enzymes: Recent Labs  Lab 04/25/23 2307 04/26/23 0843 04/27/23 0353  04/28/23 0356  CKTOTAL 698* 2,707* 2,256* 1,763*   BNP (last 3 results) No results for input(s): "PROBNP" in the last 8760 hours. HbA1C: Recent Labs    04/26/23 1420  HGBA1C 6.2*   CBG: Recent Labs  Lab 04/27/23 1027 04/27/23 1143 04/27/23 2340 04/28/23 0717 04/28/23 1230  GLUCAP 151* 127* 128* 133* 122*   Lipid Profile: No results for input(s): "CHOL", "HDL", "LDLCALC", "TRIG", "CHOLHDL", "LDLDIRECT" in the last 72 hours. Thyroid Function Tests: No results for input(s): "TSH", "T4TOTAL", "FREET4", "T3FREE", "THYROIDAB" in the last 72 hours. Anemia Panel: No results for input(s): "VITAMINB12", "FOLATE", "FERRITIN", "TIBC", "IRON", "RETICCTPCT" in the last 72 hours. Sepsis Labs: No results for input(s): "PROCALCITON", "LATICACIDVEN" in the last 168 hours.  Recent Results (from the past 240 hour(s))  Surgical PCR screen     Status: None   Collection Time: 04/26/23  5:43 PM   Specimen: Nasal Mucosa; Nasal Swab  Result Value Ref Range Status   MRSA, PCR NEGATIVE NEGATIVE Final   Staphylococcus aureus NEGATIVE NEGATIVE Final    Comment: (NOTE) The Xpert SA Assay (FDA approved for NASAL specimens in patients 61 years of age and older), is one component of a comprehensive surveillance program. It is not intended to diagnose infection nor to guide or monitor treatment. Performed at [facility] [address], [place], [place] [address]          Radiology Studies: DG HIP UNILAT W OR W/O PELVIS 2-3 VIEWS LEFT  Result Date: 04/27/2023 CLINICAL DATA:  252351 Post-operative state 252351 EXAM: DG HIP (WITH OR WITHOUT PELVIS) 2-3V LEFT COMPARISON:  04/25/2023 left hip radiographs FINDINGS: Status post interval transfixation in near-anatomic alignment of comminuted intertrochanteric left proximal femur fracture with intramedullary rod and interlocking left femoral neck pins with distal interlocking screw. No left hip dislocation. No pelvic diastasis. Mild  bilateral hip osteoarthritis. No suspicious focal osseous lesions. Marked lower lumbar degenerative disc disease. IMPRESSION: Status post interval transfixation of comminuted intertrochanteric left proximal femur fracture in near-anatomic alignment. Electronically Signed   By: Delbert Phenix M.D.   On: 04/27/2023 12:17   DG FEMUR MIN 2 VIEWS LEFT  Result Date: 04/26/2023 CLINICAL DATA:  Left intratrochanteric femoral fracture EXAM: LEFT FEMUR 2 VIEWS COMPARISON:  Films from earlier in the same day. FLUOROSCOPY TIME:  Radiation Exposure Index (as provided by the fluoroscopic device): 6.92 mGy If the device does not provide the exposure index: Fluoroscopy Time:  52 seconds Number of Acquired Images:  7 FINDINGS: Proximal short medullary rod is noted with fixation screws traversing the femoral neck as well as in the femoral shaft. Fracture fragments are in anatomic alignment. IMPRESSION: ORIF of right femoral fracture. Electronically Signed   By: Alcide Clever M.D.   On: 04/26/2023 20:58   DG C-Arm 1-60 Min-No Report  Result Date: 04/26/2023 Fluoroscopy was utilized by the requesting physician.  No radiographic interpretation.  ECHOCARDIOGRAM COMPLETE  Result Date: 04/26/2023    ECHOCARDIOGRAM REPORT   Patient Name:   KENYA NITTI Date of Exam: 04/26/2023 Medical Rec #:  [id number]          Height:       64.0 in Accession #:    [id number]         Weight:       137.1 lb Date of Birth:  Aug 20, 1934          BSA:          1.666 m Patient Age:    89 years           BP:           180/128 mmHg Patient Gender: F                  HR:           87 bpm. Exam Location:  Inpatient Procedure: 2D Echo, Color Doppler and Cardiac Doppler Indications:    Abnormal EKG, Preop eval  History:        Patient has no prior history of Echocardiogram examinations.                 Left hip fx, Arrythmias:Abnormal ECG; Risk Factors:Dyslipidemia.  Sonographer:    Milbert Coulter Referring Phys: [id number] DAVID MANUEL ORTIZ IMPRESSIONS  1.  Left ventricular ejection fraction, by estimation, is 60 to 65%. The left ventricle has normal function. The left ventricle has no regional wall motion abnormalities. There is mild concentric left ventricular hypertrophy. Left ventricular diastolic parameters are consistent with Grade I diastolic dysfunction (impaired relaxation).  2. Right ventricular systolic function is normal. The right ventricular size is normal.  3. The mitral valve is normal in structure. Mild to moderate mitral valve regurgitation. No evidence of mitral stenosis.  4. The aortic valve is grossly normal. There is mild calcification of the aortic valve. Aortic valve regurgitation is not visualized. Aortic valve sclerosis is present, with no evidence of aortic valve stenosis.  5. The inferior vena cava is normal in size with greater than 50% respiratory variability, suggesting right atrial pressure of 3 mmHg. Comparison(s): No prior Echocardiogram. FINDINGS  Left Ventricle: Left ventricular ejection fraction, by estimation, is 60 to 65%. The left ventricle has normal function. The left ventricle has no regional wall motion abnormalities. The left ventricular internal cavity size was normal in size. There is  mild concentric left ventricular hypertrophy. Left ventricular diastolic parameters are consistent with Grade I diastolic dysfunction (impaired relaxation). Right Ventricle: The right ventricular size is normal. No increase in right ventricular wall thickness. Right ventricular systolic function is normal. Left Atrium: Left atrial size was normal in size. Right Atrium: Right atrial size was normal in size. Pericardium: There is no evidence of pericardial effusion. Mitral Valve: The mitral valve is normal in structure. Mild mitral annular calcification. Mild to moderate mitral valve regurgitation. No evidence of mitral valve stenosis. Tricuspid Valve: The tricuspid valve is normal in structure. Tricuspid valve regurgitation is not  demonstrated. No evidence of tricuspid stenosis. Aortic Valve: The aortic valve is grossly normal. There is mild calcification of the aortic valve. Aortic valve regurgitation is not visualized. Aortic valve sclerosis is present, with no evidence of aortic valve stenosis. Aortic valve mean gradient measures 4.0 mmHg. Aortic valve peak gradient measures 8.0 mmHg. Aortic valve area, by VTI measures 1.79 cm. Pulmonic Valve: The pulmonic valve was normal in structure. Pulmonic valve regurgitation is not visualized.  No evidence of pulmonic stenosis. Aorta: The aortic root and ascending aorta are structurally normal, with no evidence of dilitation. Venous: The inferior vena cava is normal in size with greater than 50% respiratory variability, suggesting right atrial pressure of 3 mmHg. IAS/Shunts: No atrial level shunt detected by color flow Doppler.  LEFT VENTRICLE PLAX 2D LVIDd:         3.70 cm   Diastology LVIDs:         2.50 cm   LV e' medial:    5.77 cm/s LV PW:         1.20 cm   LV E/e' medial:  10.7 LV IVS:        1.20 cm   LV e' lateral:   7.72 cm/s LVOT diam:     2.00 cm   LV E/e' lateral: 8.0 LV SV:         46 LV SV Index:   28 LVOT Area:     3.14 cm  RIGHT VENTRICLE RV Basal diam:  2.50 cm RV Mid diam:    1.90 cm RV S prime:     16.60 cm/s TAPSE (M-mode): 1.9 cm LEFT ATRIUM             Index        RIGHT ATRIUM          Index LA diam:        3.90 cm 2.34 cm/m   RA Area:     9.34 cm LA Vol (A2C):   35.9 ml 21.54 ml/m  RA Volume:   15.30 ml 9.18 ml/m LA Vol (A4C):   30.1 ml 18.06 ml/m LA Biplane Vol: 32.9 ml 19.74 ml/m  AORTIC VALVE AV Area (Vmax):    2.06 cm AV Area (Vmean):   2.04 cm AV Area (VTI):     1.79 cm AV Vmax:           141.00 cm/s AV Vmean:          96.500 cm/s AV VTI:            0.260 m AV Peak Grad:      8.0 mmHg AV Mean Grad:      4.0 mmHg LVOT Vmax:         92.50 cm/s LVOT Vmean:        62.600 cm/s LVOT VTI:          0.148 m LVOT/AV VTI ratio: 0.57  AORTA Ao Root diam: 2.90 cm Ao Asc  diam:  2.80 cm MITRAL VALVE MV Area (PHT): 3.30 cm     SHUNTS MV Decel Time: 230 msec     Systemic VTI:  0.15 m MV E velocity: 61.70 cm/s   Systemic Diam: 2.00 cm MV A velocity: 103.00 cm/s MV E/A ratio:  0.60 Riley Lam MD Electronically signed by Riley Lam MD Signature Date/Time: 04/26/2023/3:41:00 PM    Final         Scheduled Meds:  enoxaparin  30 mg Subcutaneous Q24H   feeding supplement  237 mL Oral BID BM   levothyroxine  50 mcg Oral Q0600   multivitamin  1 tablet Oral QHS   mupirocin ointment  1 Application Nasal BID   sertraline  100 mg Oral Daily   vitamin B-12  100 mcg Oral Daily   Continuous Infusions:     LOS: 2 days    Time spent: 35 minutes    Dorcas Carrow, MD [facility]

## 2023-04-29 DIAGNOSIS — S72002A Fracture of unspecified part of neck of left femur, initial encounter for closed fracture: Secondary | ICD-10-CM | POA: Diagnosis not present

## 2023-04-29 LAB — GLUCOSE, CAPILLARY
Glucose-Capillary: 102 mg/dL — ABNORMAL HIGH (ref 70–99)
Glucose-Capillary: 104 mg/dL — ABNORMAL HIGH (ref 70–99)
Glucose-Capillary: 122 mg/dL — ABNORMAL HIGH (ref 70–99)
Glucose-Capillary: 127 mg/dL — ABNORMAL HIGH (ref 70–99)

## 2023-04-29 MED ORDER — ENOXAPARIN SODIUM 40 MG/0.4ML IJ SOSY: 30.0000 mg | PREFILLED_SYRINGE | INTRAMUSCULAR | 0 refills | Status: DC

## 2023-04-29 MED ORDER — HALOPERIDOL LACTATE 5 MG/ML IJ SOLN
1.0000 mg | Freq: Once | INTRAMUSCULAR | Status: AC
  Administered 2023-04-29: 1 mg via INTRAVENOUS
  Filled 2023-04-29: qty 1

## 2023-04-29 MED ORDER — OXYCODONE HCL 5 MG PO TABS: 2.5000 mg | ORAL_TABLET | ORAL | 0 refills | Status: DC | PRN

## 2023-04-29 NOTE — TOC PASRR Note (Signed)
30 Day PASRR Note   Patient Details  Name: Jeanne Tran Date of Birth: 1934-02-04   Transition of Care [facility]) CM/SW Contact:    Howell Rucks, RN Phone Number: 04/29/2023, 11:43 AM  To Whom It May Concern:  Please be advised that this patient will require a short-term nursing home stay - anticipated 30 days or less for rehabilitation and strengthening.   The plan is for return home.

## 2023-04-29 NOTE — TOC Progression Note (Addendum)
Transition of Care [facility]) - Progression Note    Patient Details  Name: Jeanne Tran MRN: [id number] Date of Birth: 1934/03/02  Transition of Care [facility]) CM/SW Contact  Howell Rucks, RN Phone Number: 04/29/2023, 11:46 AM  Clinical Narrative:  Cherlyn Roberts requesting 30 day PASSR note, request for MD to cosign.   -12:09pm 30 day PASRR note cosigned, uploaded in NCMust, await PASRR.    -2:09PM PASRR# [id number]  -2:37pm Call to pt's son Lyda Perone), to review short term rehab bed offers, Lyda Perone request  bed offers emailed to him at [email or web address]. NCM will follow up tomorrow for bed choice.   -3:32pm Call received from pt's sister. Luannn, reports bed choice is [facility]. Text sent to [facility] at [facility] to inquire about availability of bed, await response.     Expected Discharge Plan: Skilled Nursing Facility Barriers to Discharge: Continued Medical Work up  Expected Discharge Plan and Services In-house Referral: Clinical Social Work     Living arrangements for the past 2 months: Single Family Home Expected Discharge Date: 04/29/23                                     Social Determinants of Health (SDOH) Interventions SDOH Screenings   Food Insecurity: No Food Insecurity (04/26/2023)  Housing: Low Risk  (04/26/2023)  Transportation Needs: No Transportation Needs (04/26/2023)  Utilities: Not At Risk (04/26/2023)  Alcohol Screen: Low Risk  (12/14/2021)  Depression (PHQ2-9): Low Risk  (12/24/2022)  Financial Resource Strain: Low Risk  (01/16/2022)  Physical Activity: Inactive (12/14/2021)  Social Connections: Unknown (01/29/2022)   Received from [facility]  Recent Concern: Social Connections - Socially Isolated (12/14/2021)  Stress: No Stress Concern Present (12/14/2021)  Tobacco Use: Medium Risk (04/25/2023)    Readmission Risk Interventions     No data to display

## 2023-04-29 NOTE — Care Management Important Message (Signed)
Important Message  Patient Details IM Letter given. Name: Jeanne Tran MRN: [id number] Date of Birth: [birth year]   Medicare Important Message Given:  Yes     Caren Macadam 04/29/2023, 1:01 PM

## 2023-04-29 NOTE — Progress Notes (Signed)
Physical Therapy Treatment Patient Details Name: Jeanne Tran MRN: [id number] DOB: 1934-01-02 Today's Date: 04/29/2023   History of Present Illness Patient is a 89 year old female admitted with L hip fx. S/P IM nailing 04/26/23. CT(+) head contusion, occipital hematoma. Hx of breast Ca, OA, RA, renal insufficiency, RLS, anxiety, migraines, esophageal stricture    PT Comments   Asked RN to pre-medicate pt for therapy session-pt was not medicated-session was limited for this reason. Pt is fine at rest but has significant pain with activity. Pt agreeable to performing LE exercises for bil LEs. Patient will benefit from continued inpatient follow up therapy, <3 hours/day    If plan is discharge home, recommend the following: Two people to help with bathing/dressing/bathroom;Two people to help with walking and/or transfers   Can travel by private vehicle        Equipment Recommendations  None recommended by PT    Recommendations for Other Services       Precautions / Restrictions Precautions Precautions: Fall Restrictions Weight Bearing Restrictions: No LLE Weight Bearing: Weight bearing as tolerated     Mobility  Bed Mobility               General bed mobility comments: NT-asked RN to pre-medicate for therapy session-pt was not medicated so session was limited. Pt able to tolerate a few ROM exercises    Transfers                        Ambulation/Gait                   Stairs             Wheelchair Mobility     Tilt Bed    Modified Rankin (Stroke Patients Only)       Balance                                            Cognition Arousal: Alert Behavior During Therapy: Flat affect Overall Cognitive Status: No family/caregiver present to determine baseline cognitive functioning Area of Impairment: Orientation, Following commands, Problem solving                 Orientation Level: Disoriented to, Time      Following Commands: Follows one step commands inconsistently     Problem Solving: Slow processing, Decreased initiation, Requires verbal cues, Requires tactile cues, Difficulty sequencing          Exercises General Exercises - Lower Extremity Ankle Circles/Pumps: AROM, Both, 10 reps Quad Sets: AROM, Both, 10 reps Heel Slides: AAROM, Left, 10 reps, Supine (AROM: R LE) Hip ABduction/ADduction: AAROM, Left, 10 reps, Supine (AROM: R LE)    General Comments        Pertinent Vitals/Pain Pain Assessment Pain Assessment: Faces Faces Pain Scale: Hurts even more Pain Location: L LE with activity Pain Descriptors / Indicators: Grimacing, Operative site guarding Pain Intervention(s): Limited activity within patient's tolerance, Monitored during session (asked RN to pre-medicate pt for therapy session-pt was not medicated-session limited for this reason)    Home Living                          Prior Function            PT Goals (current goals can now be found in  the care plan section) Progress towards PT goals: Progressing toward goals    Frequency    Min 1X/week      PT Plan      Co-evaluation              AM-PAC PT "6 Clicks" Mobility   Outcome Measure  Help needed turning from your back to your side while in a flat bed without using bedrails?: Total Help needed moving from lying on your back to sitting on the side of a flat bed without using bedrails?: Total Help needed moving to and from a bed to a chair (including a wheelchair)?: Total Help needed standing up from a chair using your arms (e.g., wheelchair or bedside chair)?: Total Help needed to walk in hospital room?: Total Help needed climbing 3-5 steps with a railing? : Total 6 Click Score: 6    End of Session   Activity Tolerance: Patient limited by pain Patient left: in bed;with call bell/phone within reach;with bed alarm set   PT Visit Diagnosis: Pain;History of falling  (Z91.81);Other abnormalities of gait and mobility (R26.89) Pain - Right/Left: Left Pain - part of body: Leg     Time: 1610-9604 PT Time Calculation (min) (ACUTE ONLY): 8 min  Charges:    $Therapeutic Exercise: 8-22 mins PT General Charges $$ ACUTE PT VISIT: 1 Visit                        Faye Ramsay, PT Acute Rehabilitation  Office: [phone]

## 2023-04-29 NOTE — Progress Notes (Signed)
    3 Days Post-Op Procedure(s) (LRB): INTRAMEDULLARY (IM) NAIL INTERTROCHANTERIC (Left)  Subjective: Patient drowsy this morning however answering questions appropriately. Limited progress with PT due to pain and fatigue. Denies distal n/t. No new issues.  Objective:   VITALS:   Vitals:   04/28/23 0435 04/28/23 1234 04/29/23 0633 04/29/23 1249  BP: 126/67 (!) 107/56 98/84 115/65  Pulse: 78 79 83 81  Resp: 19 14 17 18   Temp: 97.8 F (36.6 C) 98.1 F (36.7 C) 98.2 F (36.8 C) 98.8 F (37.1 C)  TempSrc: Oral Oral Oral Oral  SpO2: 97% 97% 95% 96%  Weight:      Height:        AAOx3, mildly groggy appearing waking up from sleep Neurovascular intact Sensation intact distally Intact pulses distally Dorsiflexion/Plantar flexion intact Incision: dressing C/D/I Compartment soft Wiggles toes appropriately   Lab Results  Component Value Date   WBC 10.6 (H) 04/29/2023   HGB 7.5 (L) 04/29/2023   HCT 23.7 (L) 04/29/2023   MCV 97.1 04/29/2023   PLT 159 04/29/2023   BMET    Component Value Date/Time   NA 134 (L) 04/29/2023 0430   NA 141 04/17/2017 1504   K 3.5 04/29/2023 0430   K 5.0 04/17/2017 1504   CL 102 04/29/2023 0430   CO2 25 04/29/2023 0430   CO2 28 04/17/2017 1504   GLUCOSE 121 (H) 04/29/2023 0430   GLUCOSE 91 04/17/2017 1504   BUN 26 (H) 04/29/2023 0430   BUN 27.3 (H) 04/17/2017 1504   CREATININE 1.41 (H) 04/29/2023 0430   CREATININE 1.74 (H) 07/05/2020 1256   CREATININE 1.6 (H) 04/17/2017 1504   CALCIUM 8.4 (L) 04/29/2023 0430   CALCIUM 10.1 04/17/2017 1504   EGFR 31 (L) 04/17/2017 1504   GFRNONAA 36 (L) 04/29/2023 0430   GFRNONAA 26 (L) 07/05/2020 1256     Xray: stable post-op imaging  Assessment/Plan: 3 Days Post-Op   Principal Problem:   Closed left hip fracture, initial encounter (HCC) Active Problems:   GERD   Hyperlipidemia   Rhabdomyolysis   Hypothyroidism   CKD (chronic kidney disease) stage 4, GFR 15-29 ml/min (HCC)    Hyperglycemia  WBC 10.6* (08/13 0430) likely post-operative leukocytosis HGB  7.5* (08/13 0430), trending down -- continue to monitor, currently appears asymptomatic.  May consider transfusion and/or withholding anticoagulant if needed.   Post op recs: WB: WBAT LLE Abx: ancef x23 hours post op Imaging: PACU xrays Dressing: keep intact until follow up, change PRN if soiled or saturated. DVT prophylaxis: lovenox starting POD1 x4 weeks Follow up: 2 weeks after surgery for a wound check with Dr. Blanchie Dessert at [facility].  Address: [address], [place], [place] [address]  Office Phone: [phone]    Joen Laura 04/29/2023, 12:49 PM   Weber Cooks, MD  Contact information:   [phone] 7am-5pm epic message Dr. Blanchie Dessert, or call office for patient follow up: [phone] After hours and holidays please check [email or web address] for group call information for [facility]

## 2023-04-29 NOTE — Discharge Summary (Signed)
Physician Discharge Summary  Jeanne Tran YQM:[id number] DOB: 04/09/1934 DOA: 04/25/2023  PCP: Madelin Headings, MD  Admit date: 04/25/2023 Discharge date: 04/29/2023  Admitted From: ALF Disposition: Skilled nursing facility  Recommendations for Outpatient Follow-up:  Follow up with PCP in 1-2 weeks Please obtain BMP/CBC in one week Orthopedics to schedule follow-up  Home Health: N/A Equipment/Devices: N/A  Discharge Condition: Fair CODE STATUS: Full code Diet recommendation: Low-salt diet  Discharge summary: 89 year old with history of breast cancer, GERD, migraine, restless leg syndrome brought to the ER from her assisted living facility with fall, unable to call for help and on the floor for some time.  In the emergency room skeletal survey including CT scan of the head with a small extracranial hematoma left occipital bone.  Left hip fracture.  CK level elevated.     Assessment & Plan:   Closed traumatic intertrochanteric fracture of the left hip: Status post IM nailing 8/10 Weightbearing as tolerated Pain medications, Tylenol and oxycodone along with bowel regimen. DVT prophylaxis, Lovenox for 4 weeks. Postop follow-up as per surgery.   Rhabdomyolysis: Presented with CK level of 2700.  Downtrending.  IV fluids discontinued.  She is able to keep up oral hydration.    Acute blood loss anemia, surgical and dilutional.  Hemoglobin 8-7.9-7.5.  Currently no indication for transfusion.   Chronic medical issues including CKD stage IV, at baseline Hypothyroidism, on levothyroxine Restless leg syndrome, on sertraline 100 mg daily.    Medically stable to transfer to a SNF to continue inpatient therapies when bed available.  Discharge Diagnoses:  Principal Problem:   Closed left hip fracture, initial encounter [facility]) Active Problems:   GERD   Hyperlipidemia   Rhabdomyolysis   Hypothyroidism   CKD (chronic kidney disease) stage 4, GFR 15-29 ml/min (HCC)    Hyperglycemia    Discharge Instructions  Discharge Instructions     Diet general   Complete by: As directed    Increase activity slowly   Complete by: As directed       Allergies as of 04/29/2023       Reactions   Codeine Other (See Comments)   REACTION: Intolerance w/ pain meds not cough syrup. Codeine makes her bounce off the walls. falling REACTION: Intolerance w/ pain meds not cough syrup. Codeine makes her bounce off the walls.   Aspirin Other (See Comments)   GI issues GI issues        Medication List     STOP taking these medications    zolpidem 10 MG tablet Commonly known as: AMBIEN       TAKE these medications    CALCIUM PO Take 1 tablet by mouth daily.   enoxaparin 40 MG/0.4ML injection Commonly known as: LOVENOX Inject 0.3 mLs (30 mg total) into the skin daily for 28 days.   levothyroxine 50 MCG tablet Commonly known as: SYNTHROID TAKE 1 TABLET(50 MCG) BY MOUTH DAILY   MELATIN PO Take 1 tablet by mouth at bedtime as needed (sleep).   mirtazapine 15 MG tablet Commonly known as: REMERON TAKE 1 TO 2 TABLETS BY MOUTH AT BEDTIME FOR SLEEP What changed:  how much to take how to take this when to take this   oxyCODONE 5 MG immediate release tablet Commonly known as: Oxy IR/ROXICODONE Take 0.5-1 tablets (2.5-5 mg total) by mouth every 4 (four) hours as needed for severe pain or moderate pain.   sertraline 100 MG tablet Commonly known as: ZOLOFT TAKE 1 TABLET(100 MG) BY MOUTH DAILY  What changed: See the new instructions.   vitamin B-12 100 MCG tablet Commonly known as: CYANOCOBALAMIN Take 100 mcg by mouth daily.        Follow-up Information     Joen Laura, MD Follow up in 2 week(s).   Specialty: Orthopedic Surgery Contact information: [address] [place] [address] [phone]                Allergies  Allergen Reactions   Codeine Other (See Comments)    REACTION: Intolerance w/ pain meds  not cough syrup. Codeine makes her bounce off the walls. falling REACTION: Intolerance w/ pain meds not cough syrup. Codeine makes her bounce off the walls.   Aspirin Other (See Comments)    GI issues GI issues    Consultations: Orthopedics   Procedures/Studies: DG HIP UNILAT W OR W/O PELVIS 2-3 VIEWS LEFT  Result Date: 04/27/2023 CLINICAL DATA:  252351 Post-operative state 252351 EXAM: DG HIP (WITH OR WITHOUT PELVIS) 2-3V LEFT COMPARISON:  04/25/2023 left hip radiographs FINDINGS: Status post interval transfixation in near-anatomic alignment of comminuted intertrochanteric left proximal femur fracture with intramedullary rod and interlocking left femoral neck pins with distal interlocking screw. No left hip dislocation. No pelvic diastasis. Mild bilateral hip osteoarthritis. No suspicious focal osseous lesions. Marked lower lumbar degenerative disc disease. IMPRESSION: Status post interval transfixation of comminuted intertrochanteric left proximal femur fracture in near-anatomic alignment. Electronically Signed   By: Delbert Phenix M.D.   On: 04/27/2023 12:17   DG FEMUR MIN 2 VIEWS LEFT  Result Date: 04/26/2023 CLINICAL DATA:  Left intratrochanteric femoral fracture EXAM: LEFT FEMUR 2 VIEWS COMPARISON:  Films from earlier in the same day. FLUOROSCOPY TIME:  Radiation Exposure Index (as provided by the fluoroscopic device): 6.92 mGy If the device does not provide the exposure index: Fluoroscopy Time:  52 seconds Number of Acquired Images:  7 FINDINGS: Proximal short medullary rod is noted with fixation screws traversing the femoral neck as well as in the femoral shaft. Fracture fragments are in anatomic alignment. IMPRESSION: ORIF of right femoral fracture. Electronically Signed   By: Alcide Clever M.D.   On: 04/26/2023 20:58   DG C-Arm 1-60 Min-No Report  Result Date: 04/26/2023 Fluoroscopy was utilized by the requesting physician.  No radiographic interpretation.   ECHOCARDIOGRAM  COMPLETE  Result Date: 04/26/2023    ECHOCARDIOGRAM REPORT   Patient Name:   Jeanne Tran Date of Exam: 04/26/2023 Medical Rec #:  [id number]          Height:       64.0 in Accession #:    [id number]         Weight:       137.1 lb Date of Birth:  Aug 11, 1934          BSA:          1.666 m Patient Age:    89 years           BP:           180/128 mmHg Patient Gender: F                  HR:           87 bpm. Exam Location:  Inpatient Procedure: 2D Echo, Color Doppler and Cardiac Doppler Indications:    Abnormal EKG, Preop eval  History:        Patient has no prior history of Echocardiogram examinations.  Left hip fx, Arrythmias:Abnormal ECG; Risk Factors:Dyslipidemia.  Sonographer:    Milbert Coulter Referring Phys: [id number] DAVID MANUEL ORTIZ IMPRESSIONS  1. Left ventricular ejection fraction, by estimation, is 60 to 65%. The left ventricle has normal function. The left ventricle has no regional wall motion abnormalities. There is mild concentric left ventricular hypertrophy. Left ventricular diastolic parameters are consistent with Grade I diastolic dysfunction (impaired relaxation).  2. Right ventricular systolic function is normal. The right ventricular size is normal.  3. The mitral valve is normal in structure. Mild to moderate mitral valve regurgitation. No evidence of mitral stenosis.  4. The aortic valve is grossly normal. There is mild calcification of the aortic valve. Aortic valve regurgitation is not visualized. Aortic valve sclerosis is present, with no evidence of aortic valve stenosis.  5. The inferior vena cava is normal in size with greater than 50% respiratory variability, suggesting right atrial pressure of 3 mmHg. Comparison(s): No prior Echocardiogram. FINDINGS  Left Ventricle: Left ventricular ejection fraction, by estimation, is 60 to 65%. The left ventricle has normal function. The left ventricle has no regional wall motion abnormalities. The left ventricular internal cavity  size was normal in size. There is  mild concentric left ventricular hypertrophy. Left ventricular diastolic parameters are consistent with Grade I diastolic dysfunction (impaired relaxation). Right Ventricle: The right ventricular size is normal. No increase in right ventricular wall thickness. Right ventricular systolic function is normal. Left Atrium: Left atrial size was normal in size. Right Atrium: Right atrial size was normal in size. Pericardium: There is no evidence of pericardial effusion. Mitral Valve: The mitral valve is normal in structure. Mild mitral annular calcification. Mild to moderate mitral valve regurgitation. No evidence of mitral valve stenosis. Tricuspid Valve: The tricuspid valve is normal in structure. Tricuspid valve regurgitation is not demonstrated. No evidence of tricuspid stenosis. Aortic Valve: The aortic valve is grossly normal. There is mild calcification of the aortic valve. Aortic valve regurgitation is not visualized. Aortic valve sclerosis is present, with no evidence of aortic valve stenosis. Aortic valve mean gradient measures 4.0 mmHg. Aortic valve peak gradient measures 8.0 mmHg. Aortic valve area, by VTI measures 1.79 cm. Pulmonic Valve: The pulmonic valve was normal in structure. Pulmonic valve regurgitation is not visualized. No evidence of pulmonic stenosis. Aorta: The aortic root and ascending aorta are structurally normal, with no evidence of dilitation. Venous: The inferior vena cava is normal in size with greater than 50% respiratory variability, suggesting right atrial pressure of 3 mmHg. IAS/Shunts: No atrial level shunt detected by color flow Doppler.  LEFT VENTRICLE PLAX 2D LVIDd:         3.70 cm   Diastology LVIDs:         2.50 cm   LV e' medial:    5.77 cm/s LV PW:         1.20 cm   LV E/e' medial:  10.7 LV IVS:        1.20 cm   LV e' lateral:   7.72 cm/s LVOT diam:     2.00 cm   LV E/e' lateral: 8.0 LV SV:         46 LV SV Index:   28 LVOT Area:     3.14 cm   RIGHT VENTRICLE RV Basal diam:  2.50 cm RV Mid diam:    1.90 cm RV S prime:     16.60 cm/s TAPSE (M-mode): 1.9 cm LEFT ATRIUM  Index        RIGHT ATRIUM          Index LA diam:        3.90 cm 2.34 cm/m   RA Area:     9.34 cm LA Vol (A2C):   35.9 ml 21.54 ml/m  RA Volume:   15.30 ml 9.18 ml/m LA Vol (A4C):   30.1 ml 18.06 ml/m LA Biplane Vol: 32.9 ml 19.74 ml/m  AORTIC VALVE AV Area (Vmax):    2.06 cm AV Area (Vmean):   2.04 cm AV Area (VTI):     1.79 cm AV Vmax:           141.00 cm/s AV Vmean:          96.500 cm/s AV VTI:            0.260 m AV Peak Grad:      8.0 mmHg AV Mean Grad:      4.0 mmHg LVOT Vmax:         92.50 cm/s LVOT Vmean:        62.600 cm/s LVOT VTI:          0.148 m LVOT/AV VTI ratio: 0.57  AORTA Ao Root diam: 2.90 cm Ao Asc diam:  2.80 cm MITRAL VALVE MV Area (PHT): 3.30 cm     SHUNTS MV Decel Time: 230 msec     Systemic VTI:  0.15 m MV E velocity: 61.70 cm/s   Systemic Diam: 2.00 cm MV A velocity: 103.00 cm/s MV E/A ratio:  0.60 Riley Lam MD Electronically signed by Riley Lam MD Signature Date/Time: 04/26/2023/3:41:00 PM    Final    DG FEMUR MIN 2 VIEWS LEFT  Result Date: 04/26/2023 CLINICAL DATA:  [id number] Fracture 96051 EXAM: LEFT FEMUR 2 VIEWS COMPARISON:  X-ray left hip 04/25/2023 FINDINGS: Acute superiorly displaced and impacted left intertrochanteric fracture. Distally no acute femoral fracture. Least moderate degenerative changes of the medial tibiofemoral compartment. Knee grossly unremarkable for traumatic injury. Nonstandard views with the entire femoral shaft possibly not completely visualized. Soft tissues are unremarkable. IMPRESSION: 1. Acute superiorly displaced and impacted left intertrochanteric fracture. Distally no acute femoral fracture; however, nonstandard views with the entire femoral shaft possibly not completely visualized. 2. Knee grossly unremarkable for traumatic injury. Electronically Signed   By: Tish Frederickson M.D.   On:  04/26/2023 01:01   DG Chest 1 View  Result Date: 04/25/2023 CLINICAL DATA:  Fall, hip fracture EXAM: CHEST  1 VIEW COMPARISON:  07/17/2013 FINDINGS: Lungs are clear.  No pleural effusion or pneumothorax. The heart is normal in size.  Thoracic aortic atherosclerosis. Surgical clips along the left chest wall/axilla. IMPRESSION: No acute cardiopulmonary disease. Electronically Signed   By: Charline Bills M.D.   On: 04/25/2023 23:39   DG Hip Unilat W or Wo Pelvis 2-3 Views Left  Result Date: 04/25/2023 CLINICAL DATA:  Fall, hip fracture EXAM: DG HIP (WITH OR WITHOUT PELVIS) 2-3V LEFT COMPARISON:  None Available. FINDINGS: Comminuted intertrochanteric left hip fracture with foreshortening and varus angulation. Right hip is intact. Bilateral hip joint spaces are preserved. Visualized bony pelvis appears intact. Mild degenerative changes of the lower lumbar spine. IMPRESSION: Comminuted intertrochanteric left hip fracture, as above. Electronically Signed   By: Charline Bills M.D.   On: 04/25/2023 23:38   CT HEAD WO CONTRAST ( )  Result Date: 04/25/2023 CLINICAL DATA:  Fall, posterior head hematoma EXAM: CT HEAD WITHOUT CONTRAST CT CERVICAL SPINE WITHOUT CONTRAST TECHNIQUE: Multidetector CT imaging of  the head and cervical spine was performed following the standard protocol without intravenous contrast. Multiplanar CT image reconstructions of the cervical spine were also generated. RADIATION DOSE REDUCTION: This exam was performed according to the departmental dose-optimization program which includes automated exposure control, adjustment of the mA and/or kV according to patient size and/or use of iterative reconstruction technique. COMPARISON:  None Available. FINDINGS: CT HEAD FINDINGS Brain: No evidence of acute infarction, hemorrhage, hydrocephalus, extra-axial collection or mass lesion/mass effect. Age related atrophy. Subcortical white matter and periventricular small vessel ischemic changes.  Vascular: Mild intracranial atherosclerosis. Skull: Normal. Negative for fracture or focal lesion. Sinuses/Orbits: The visualized paranasal sinuses are essentially clear. The mastoid air cells are unopacified. Other: Small extracranial hematoma overlying the left occipital bone (series 2/image 14). CT CERVICAL SPINE FINDINGS Alignment: Normal cervical lordosis. Skull base and vertebrae: No acute fracture. No primary bone lesion or focal pathologic process. Soft tissues and spinal canal: No prevertebral fluid or swelling. No visible canal hematoma. Disc levels: C4-6 ACDF, without evidence of complication. Mild degenerative changes at C3-4 and C6-7. Spinal canal is patent. Upper chest: Visualized lung apices are notable for moderate centrilobular and paraseptal emphysematous changes. Calcified granuloma in the left lung apex, benign. Other: Visualized thyroid is unremarkable. IMPRESSION: Small extracranial hematoma overlying the left occipital bone. No evidence of calvarial fracture. No acute intracranial abnormality. Atrophy with small vessel ischemic changes. No traumatic injury to the cervical spine. C4-6 ACDF, without evidence of complication. Mild degenerative changes. Electronically Signed   By: Charline Bills M.D.   On: 04/25/2023 23:29   CT Cervical Spine Wo Contrast  Result Date: 04/25/2023 CLINICAL DATA:  Fall, posterior head hematoma EXAM: CT HEAD WITHOUT CONTRAST CT CERVICAL SPINE WITHOUT CONTRAST TECHNIQUE: Multidetector CT imaging of the head and cervical spine was performed following the standard protocol without intravenous contrast. Multiplanar CT image reconstructions of the cervical spine were also generated. RADIATION DOSE REDUCTION: This exam was performed according to the departmental dose-optimization program which includes automated exposure control, adjustment of the mA and/or kV according to patient size and/or use of iterative reconstruction technique. COMPARISON:  None Available.  FINDINGS: CT HEAD FINDINGS Brain: No evidence of acute infarction, hemorrhage, hydrocephalus, extra-axial collection or mass lesion/mass effect. Age related atrophy. Subcortical white matter and periventricular small vessel ischemic changes. Vascular: Mild intracranial atherosclerosis. Skull: Normal. Negative for fracture or focal lesion. Sinuses/Orbits: The visualized paranasal sinuses are essentially clear. The mastoid air cells are unopacified. Other: Small extracranial hematoma overlying the left occipital bone (series 2/image 14). CT CERVICAL SPINE FINDINGS Alignment: Normal cervical lordosis. Skull base and vertebrae: No acute fracture. No primary bone lesion or focal pathologic process. Soft tissues and spinal canal: No prevertebral fluid or swelling. No visible canal hematoma. Disc levels: C4-6 ACDF, without evidence of complication. Mild degenerative changes at C3-4 and C6-7. Spinal canal is patent. Upper chest: Visualized lung apices are notable for moderate centrilobular and paraseptal emphysematous changes. Calcified granuloma in the left lung apex, benign. Other: Visualized thyroid is unremarkable. IMPRESSION: Small extracranial hematoma overlying the left occipital bone. No evidence of calvarial fracture. No acute intracranial abnormality. Atrophy with small vessel ischemic changes. No traumatic injury to the cervical spine. C4-6 ACDF, without evidence of complication. Mild degenerative changes. Electronically Signed   By: Charline Bills M.D.   On: 04/25/2023 23:29   (Echo, Carotid, EGD, Colonoscopy, ERCP)    Subjective: Patient seen and examined.  She had slight impulsiveness and confusion overnight but currently denies any complaints.  Pain is controlled.   Discharge Exam: Vitals:   04/28/23 1234 04/29/23 0633  BP: (!) 107/56 98/84  Pulse: 79 83  Resp: 14 17  Temp: 98.1 F (36.7 C) 98.2 F (36.8 C)  SpO2: 97% 95%   Vitals:   04/27/23 2127 04/28/23 0435 04/28/23 1234 04/29/23  0633  BP: (!) 111/55 126/67 (!) 107/56 98/84  Pulse: 86 78 79 83  Resp: 19 19 14 17   Temp: 98.3 F (36.8 C) 97.8 F (36.6 C) 98.1 F (36.7 C) 98.2 F (36.8 C)  TempSrc: Oral Oral Oral Oral  SpO2: 93% 97% 97% 95%  Weight:      Height:        General: Pt is alert, awake, not in acute distress Alert awake and oriented.  Flat affect.  Slightly anxious. Cardiovascular: RRR, S1/S2 +, no rubs, no gallops Respiratory: CTA bilaterally, no wheezing, no rhonchi Abdominal: Soft, NT, ND, bowel sounds + Extremities: no cyanosis Left lateral thigh incisions clean and dry, minimal edema.    The results of significant diagnostics from this hospitalization (including imaging, microbiology, ancillary and laboratory) are listed below for reference.     Microbiology: Recent Results (from the past 240 hour(s))  Surgical PCR screen     Status: None   Collection Time: 04/26/23  5:43 PM   Specimen: Nasal Mucosa; Nasal Swab  Result Value Ref Range Status   MRSA, PCR NEGATIVE NEGATIVE Final   Staphylococcus aureus NEGATIVE NEGATIVE Final    Comment: (NOTE) The Xpert SA Assay (FDA approved for NASAL specimens in patients 83 years of age and older), is one component of a comprehensive surveillance program. It is not intended to diagnose infection nor to guide or monitor treatment. Performed at [facility], [address] [place], [place] [address]      Labs: BNP (last 3 results) No results for input(s): "BNP" in the last 8760 hours. Basic Metabolic Panel: Recent Labs  Lab 04/25/23 2307 04/26/23 0843 04/27/23 0353 04/28/23 0356 04/29/23 0430  NA 135 139 134* 134* 134*  K 3.7 4.0 4.6 4.1 3.5  CL 103 106 100 103 102  CO2 23 22 26 25 25   GLUCOSE 172* 120* 171* 127* 121*  BUN 44* 40* 36* 31* 26*  CREATININE 1.93* 1.75* 1.87* 1.60* 1.41*  CALCIUM 8.7* 8.6* 8.4* 8.0* 8.4*   Liver Function Tests: Recent Labs  Lab 04/25/23 2307 04/27/23 0353  AST 37 85*  ALT 26  36  ALKPHOS 103 79  BILITOT 0.6 0.5  PROT 6.6 5.6*  ALBUMIN 3.8 3.1*   No results for input(s): "LIPASE", "AMYLASE" in the last 168 hours. No results for input(s): "AMMONIA" in the last 168 hours. CBC: Recent Labs  Lab 04/25/23 2307 04/27/23 0353 04/28/23 0356 04/29/23 0430  WBC 13.6* 11.0* 10.8* 10.6*  NEUTROABS 12.0*  --  8.2* 7.8*  HGB 12.0 8.9* 7.9* 7.5*  HCT 37.1 27.4* 25.1* 23.7*  MCV 95.6 97.2 98.4 97.1  PLT 181 162 152 159   Cardiac Enzymes: Recent Labs  Lab 04/25/23 2307 04/26/23 0843 04/27/23 0353 04/28/23 0356 04/29/23 0430  CKTOTAL 698* 2,707* 2,256* 1,763* 1,023*   BNP: Invalid input(s): "POCBNP" CBG: Recent Labs  Lab 04/28/23 0717 04/28/23 1230 04/28/23 1912 04/29/23 0039 04/29/23 0636  GLUCAP 133* 122* 106* 122* 102*   D-Dimer No results for input(s): "DDIMER" in the last 72 hours. Hgb A1c Recent Labs    04/26/23 1420  HGBA1C 6.2*   Lipid Profile No results for input(s): "CHOL", "  HDL", "LDLCALC", "TRIG", "CHOLHDL", "LDLDIRECT" in the last 72 hours. Thyroid function studies No results for input(s): "TSH", "T4TOTAL", "T3FREE", "THYROIDAB" in the last 72 hours.  Invalid input(s): "FREET3" Anemia work up No results for input(s): "VITAMINB12", "FOLATE", "FERRITIN", "TIBC", "IRON", "RETICCTPCT" in the last 72 hours. Urinalysis    Component Value Date/Time   COLORURINE YELLOW 01/24/2022 1302   APPEARANCEUR CLEAR 01/24/2022 1302   LABSPEC 1.015 01/24/2022 1302   PHURINE 6.0 01/24/2022 1302   GLUCOSEU NEGATIVE 01/24/2022 1302   HGBUR NEGATIVE 01/24/2022 1302   HGBUR negative 07/02/2010 1102   BILIRUBINUR NEGATIVE 01/24/2022 1302   BILIRUBINUR Negative 05/02/2020 1026   KETONESUR NEGATIVE 01/24/2022 1302   PROTEINUR Negative 05/02/2020 1026   PROTEINUR 100 (A) 07/17/2013 2313   UROBILINOGEN 0.2 01/24/2022 1302   NITRITE NEGATIVE 01/24/2022 1302   LEUKOCYTESUR NEGATIVE 01/24/2022 1302   Sepsis Labs Recent Labs  Lab 04/25/23 2307  04/27/23 0353 04/28/23 0356 04/29/23 0430  WBC 13.6* 11.0* 10.8* 10.6*   Microbiology Recent Results (from the past 240 hour(s))  Surgical PCR screen     Status: None   Collection Time: 04/26/23  5:43 PM   Specimen: Nasal Mucosa; Nasal Swab  Result Value Ref Range Status   MRSA, PCR NEGATIVE NEGATIVE Final   Staphylococcus aureus NEGATIVE NEGATIVE Final    Comment: (NOTE) The Xpert SA Assay (FDA approved for NASAL specimens in patients 40 years of age and older), is one component of a comprehensive surveillance program. It is not intended to diagnose infection nor to guide or monitor treatment. Performed at [facility], [address] [place], [place] [address]      Time coordinating discharge: 35 minutes  SIGNED:   Dorcas Carrow, MD  [facility] 04/29/2023, 10:02 AM

## 2023-04-30 DIAGNOSIS — S72002A Fracture of unspecified part of neck of left femur, initial encounter for closed fracture: Secondary | ICD-10-CM | POA: Diagnosis not present

## 2023-04-30 DIAGNOSIS — E440 Moderate protein-calorie malnutrition: Secondary | ICD-10-CM | POA: Insufficient documentation

## 2023-04-30 LAB — GLUCOSE, CAPILLARY
Glucose-Capillary: 104 mg/dL — ABNORMAL HIGH (ref 70–99)
Glucose-Capillary: 126 mg/dL — ABNORMAL HIGH (ref 70–99)
Glucose-Capillary: 139 mg/dL — ABNORMAL HIGH (ref 70–99)
Glucose-Capillary: 162 mg/dL — ABNORMAL HIGH (ref 70–99)

## 2023-04-30 MED ORDER — HALOPERIDOL LACTATE 5 MG/ML IJ SOLN
1.0000 mg | Freq: Once | INTRAMUSCULAR | Status: AC
  Administered 2023-04-30: 1 mg via INTRAVENOUS
  Filled 2023-04-30: qty 1

## 2023-04-30 MED ORDER — SENNOSIDES-DOCUSATE SODIUM 8.6-50 MG PO TABS
1.0000 | ORAL_TABLET | Freq: Two times a day (BID) | ORAL | Status: DC
  Administered 2023-04-30 – 2023-05-01 (×3): 1 via ORAL
  Filled 2023-04-30 (×3): qty 1

## 2023-04-30 MED ORDER — ADULT MULTIVITAMIN W/MINERALS CH
1.0000 | ORAL_TABLET | Freq: Every day | ORAL | Status: DC
  Administered 2023-04-30 – 2023-05-01 (×2): 1 via ORAL
  Filled 2023-04-30 (×2): qty 1

## 2023-04-30 MED ORDER — POLYETHYLENE GLYCOL 3350 17 G PO PACK
17.0000 g | PACK | Freq: Every day | ORAL | Status: DC
  Administered 2023-04-30 – 2023-05-01 (×2): 17 g via ORAL
  Filled 2023-04-30 (×2): qty 1

## 2023-04-30 MED ORDER — IRON SUCROSE 500 MG IVPB - SIMPLE MED
500.0000 mg | Freq: Once | INTRAVENOUS | Status: AC
  Administered 2023-04-30: 500 mg via INTRAVENOUS
  Filled 2023-04-30: qty 500

## 2023-04-30 NOTE — TOC Progression Note (Signed)
Transition of Care [facility]) - Progression Note    Patient Details  Name: Jeanne Tran MRN: [id number] Date of Birth: December 16, 1933  Transition of Care [facility] CM/SW Contact  Howell Rucks, RN Phone Number: 04/30/2023, 10:15 AM  Clinical Narrative:  Text received from [facility] with [facility], confirmed bed available today, team notified.      Expected Discharge Plan: Skilled Nursing Facility Barriers to Discharge: Continued Medical Work up  Expected Discharge Plan and Services In-house Referral: Clinical Social Work     Living arrangements for the past 2 months: Single Family Home Expected Discharge Date: 04/29/23                                     Social Determinants of Health (SDOH) Interventions SDOH Screenings   Food Insecurity: No Food Insecurity (04/26/2023)  Housing: Low Risk  (04/26/2023)  Transportation Needs: No Transportation Needs (04/26/2023)  Utilities: Not At Risk (04/26/2023)  Alcohol Screen: Low Risk  (12/14/2021)  Depression (PHQ2-9): Low Risk  (12/24/2022)  Financial Resource Strain: Low Risk  (01/16/2022)  Physical Activity: Inactive (12/14/2021)  Social Connections: Unknown (01/29/2022)   Received from [facility]  Recent Concern: Social Connections - Socially Isolated (12/14/2021)  Stress: No Stress Concern Present (12/14/2021)  Tobacco Use: Medium Risk (04/25/2023)    Readmission Risk Interventions     No data to display

## 2023-04-30 NOTE — Progress Notes (Signed)
    4 Days Post-Op Procedure(s) (LRB): INTRAMEDULLARY (IM) NAIL INTERTROCHANTERIC (Left)  Subjective: Patient more alert this morning, answering questions appropriately. Slept well.  Reports pain as none.  Limited progress with PT yesterday due to pain and fatigue.  Denies distal n/t. No new issues.  Objective:   VITALS:   Vitals:   04/29/23 1249 04/29/23 2035 04/30/23 0540 04/30/23 0807  BP: 115/65 (!) 110/55 121/68 137/66  Pulse: 81 90 84 80  Resp: 18 16 18 20   Temp: 98.8 F (37.1 C) 98.6 F (37 C) 97.9 F (36.6 C) 98 F (36.7 C)  TempSrc: Oral Oral Oral Oral  SpO2: 96% 95% 96% 97%  Weight:      Height:        AAOx4, sitting comfortably Neurovascular intact Sensation intact distally Intact pulses distally Dorsiflexion/Plantar flexion intact Incision: dressing C/D/I Compartment soft Wiggles toes appropriately   Lab Results  Component Value Date   WBC 8.9 04/30/2023   HGB 7.2 (L) 04/30/2023   HCT 22.3 (L) 04/30/2023   MCV 95.7 04/30/2023   PLT 191 04/30/2023   BMET    Component Value Date/Time   NA 134 (L) 04/30/2023 0357   NA 141 04/17/2017 1504   K 3.8 04/30/2023 0357   K 5.0 04/17/2017 1504   CL 101 04/30/2023 0357   CO2 26 04/30/2023 0357   CO2 28 04/17/2017 1504   GLUCOSE 110 (H) 04/30/2023 0357   GLUCOSE 91 04/17/2017 1504   BUN 33 (H) 04/30/2023 0357   BUN 27.3 (H) 04/17/2017 1504   CREATININE 1.52 (H) 04/30/2023 0357   CREATININE 1.74 (H) 07/05/2020 1256   CREATININE 1.6 (H) 04/17/2017 1504   CALCIUM 8.4 (L) 04/30/2023 0357   CALCIUM 10.1 04/17/2017 1504   EGFR 31 (L) 04/17/2017 1504   GFRNONAA 33 (L) 04/30/2023 0357   GFRNONAA 26 (L) 07/05/2020 1256     Xray: stable post-op imaging  Assessment/Plan: 4 Days Post-Op   Principal Problem:   Closed left hip fracture, initial encounter (HCC) Active Problems:   GERD   Hyperlipidemia   Rhabdomyolysis   Hypothyroidism   CKD (chronic kidney disease) stage 4, GFR 15-29 ml/min (HCC)    Hyperglycemia  WBC 8.9 (08/14 0357) likely post-operative leukocytosis HGB  7.2* (08/14 0357), trending down, Dr. Blanchie Dessert and hospitalist aware -- Hold lovenox today, reassess tomorrow.   Post op recs: WB: WBAT LLE Abx: ancef x23 hours post op Imaging: PACU xrays Dressing: keep intact until follow up, change PRN if soiled or saturated. DVT prophylaxis: lovenox starting POD1 x4 weeks -- HOLDING FOR NOW DC: per medicine, likely SNF Follow up: 2 weeks after surgery for a wound check with Dr. Blanchie Dessert at [facility].  Address: [address], [place], [place] [address]  Office Phone: [phone]   Cecil Cobbs 04/30/2023, 8:14 AM   Weber Cooks, MD  Contact information:   [phone] 7am-5pm epic message Dr. Blanchie Dessert, or call office for patient follow up: [phone] After hours and holidays please check [email or web address] for group call information for [facility]

## 2023-04-30 NOTE — Progress Notes (Signed)
Nutrition Follow-up  DOCUMENTATION CODES:   Non-severe (moderate) malnutrition in context of chronic illness  INTERVENTION:  - Regular diet.  - Ensure Plus High Protein po BID, each supplement provides 350 kcal and 20 grams of protein. - Multivitamin with minerals daily - Monitor weight trends.   NUTRITION DIAGNOSIS:   Moderate Malnutrition related to chronic illness as evidenced by mild fat depletion, mild muscle depletion. *new  GOAL:   Patient will meet greater than or equal to 90% of their needs *progressing  MONITOR:   PO intake, Supplement acceptance, Weight trends  REASON FOR ASSESSMENT:   Consult Hip fracture protocol  ASSESSMENT:   89 y.o. female admits related to fall. PMH includes: arthritis, breast cancer, diverticulosis, GERD, ERCP, HLD. Pt is currently receiving medical management related to left hip fracture.  Patient endorses a UBW of 135# and that she has lost about 25# over the past 4 months due to not eating very much.  However, per chart review weight has trended up over the past year.   She reports mostly snacking throughout the day at home as opposed to any set meals. Admits her appetite has been decreased recently and she has been eating less.   Thankfully, her current appetite is improved and she endorses eating fairly well since admit. She is documented to be consuming an average of 39% of meals. She has also been drinking Ensure 1-2 times a day and enjoys it. Encouraged patient to try and consume 3 meals and to continue drinking Ensure to support healing and healthy weight maintenance.    Medications reviewed and include: Rena-vit, vitamin B12  Labs reviewed:  Na 131 K+ 2.9 HA1C 6.4    NUTRITION - FOCUSED PHYSICAL EXAM:  Flowsheet Row Most Recent Value  Orbital Region Mild depletion  Upper Arm Region No depletion  Thoracic and Lumbar Region No depletion  Buccal Region Mild depletion  Temple Region Moderate depletion  Clavicle  Bone Region Mild depletion  Clavicle and Acromion Bone Region Mild depletion  Scapular Bone Region Unable to assess  Dorsal Hand Mild depletion  Patellar Region No depletion  Anterior Thigh Region No depletion  Posterior Calf Region No depletion  Edema (RD Assessment) None  Hair Reviewed  Eyes Reviewed  Mouth Reviewed  Skin Reviewed  Nails Reviewed       Diet Order:   Diet Order             Diet general           Diet regular Room service appropriate? Yes; Fluid consistency: Thin  Diet effective now                   EDUCATION NEEDS:  Education needs have been addressed  Skin:  Skin Assessment: Reviewed RN Assessment Skin Integrity Issues:: Incisions Incisions: L hip  Last BM:  8/9  Height:  Ht Readings from Last 1 Encounters:  04/25/23 5\' 4"  (1.626 m)   Weight:  Wt Readings from Last 1 Encounters:  04/25/23 62.2 kg   BMI:  Body mass index is 23.53 kg/m.  Estimated Nutritional Needs:  Kcal:  1700-1850 kcals Protein:  75-95 grams Fluid:  >/= 1.7L    Shelle Iron RD, LDN For contact information, refer to [facility].

## 2023-04-30 NOTE — Progress Notes (Signed)
PROGRESS NOTE    Jeanne Tran  UVO:[id number] DOB: 1934/03/20 DOA: 04/25/2023 PCP: Madelin Headings, MD    Brief Narrative:  Admitted with fall and left hip fracture.  Elevated CK. Remains in the hospital with dropping hemoglobin.  7.2 today.   Assessment & Plan:   Closed traumatic intertrochanter fracture of the left hip: Weightbearing as tolerated Tylenol and oxycodone.  Add bowel regimen with Senokot and MiraLAX. DVT prophylaxis, Lovenox.  On hold today.  See below. Refer to SNF.  Possible tomorrow.  Traumatic rhabdomyolysis: Improved  Acute blood loss anemia with history of chronic anemia of chronic disease. Recent known baseline hemoglobin of 12.  Continues to drift down.  7.2 today.  No indication for blood transfusion.  No evidence of active bleeding. Will transfuse iron today, recheck hemoglobin tomorrow before discharge to ensure stabilization.  Case discussed with orthopedics.  They recommended monitor patient in the hospital due to dropping hemoglobin.  DVT prophylaxis: SCDs Start: 04/26/23 7425   Code Status: Full code Family Communication: None at the bedside Disposition Plan: Status is: Inpatient Remains inpatient appropriate because: Postop.  Dropping hemoglobin.     Consultants:  Orthopedics  Procedures:  Left femur IM nail  Antimicrobials:  Perioperative   Subjective: Patient seen and examined.  Denies any complaints at rest.  She slept well.  Painful on mobility.  Objective: Vitals:   04/29/23 1249 04/29/23 2035 04/30/23 0540 04/30/23 0807  BP: 115/65 (!) 110/55 121/68 137/66  Pulse: 81 90 84 80  Resp: 18 16 18 20   Temp: 98.8 F (37.1 C) 98.6 F (37 C) 97.9 F (36.6 C) 98 F (36.7 C)  TempSrc: Oral Oral Oral Oral  SpO2: 96% 95% 96% 97%  Weight:      Height:        Intake/Output Summary (Last 24 hours) at 04/30/2023 1205 Last data filed at 04/30/2023 1009 Gross per 24 hour  Intake 420 ml  Output 1200 ml  Net -780 ml   Filed  Weights   04/25/23 2252  Weight: 62.2 kg    Examination:  Appears comfortable and frail. Left lateral thigh incisions clean and dry.  Minimal edema.    Data Reviewed: I have personally reviewed following labs and imaging studies  CBC: Recent Labs  Lab 04/25/23 2307 04/27/23 0353 04/28/23 0356 04/29/23 0430 04/30/23 0357  WBC 13.6* 11.0* 10.8* 10.6* 8.9  NEUTROABS 12.0*  --  8.2* 7.8* 6.2  HGB 12.0 8.9* 7.9* 7.5* 7.2*  HCT 37.1 27.4* 25.1* 23.7* 22.3*  MCV 95.6 97.2 98.4 97.1 95.7  PLT 181 162 152 159 191   Basic Metabolic Panel: Recent Labs  Lab 04/26/23 0843 04/27/23 0353 04/28/23 0356 04/29/23 0430 04/30/23 0357  NA 139 134* 134* 134* 134*  K 4.0 4.6 4.1 3.5 3.8  CL 106 100 103 102 101  CO2 22 26 25 25 26   GLUCOSE 120* 171* 127* 121* 110*  BUN 40* 36* 31* 26* 33*  CREATININE 1.75* 1.87* 1.60* 1.41* 1.52*  CALCIUM 8.6* 8.4* 8.0* 8.4* 8.4*   GFR: Estimated Creatinine Clearance: 22.5 mL/min (A) (by C-G formula based on SCr of 1.52 mg/dL (H)). Liver Function Tests: Recent Labs  Lab 04/25/23 2307 04/27/23 0353  AST 37 85*  ALT 26 36  ALKPHOS 103 79  BILITOT 0.6 0.5  PROT 6.6 5.6*  ALBUMIN 3.8 3.1*   No results for input(s): "LIPASE", "AMYLASE" in the last 168 hours. No results for input(s): "AMMONIA" in the last 168 hours. Coagulation Profile:  Recent Labs  Lab 04/25/23 2307  INR 1.0   Cardiac Enzymes: Recent Labs  Lab 04/26/23 0843 04/27/23 0353 04/28/23 0356 04/29/23 0430 04/30/23 0357  CKTOTAL 2,707* 2,256* 1,763* 1,023* 438*   BNP (last 3 results) No results for input(s): "PROBNP" in the last 8760 hours. HbA1C: No results for input(s): "HGBA1C" in the last 72 hours. CBG: Recent Labs  Lab 04/29/23 0636 04/29/23 1131 04/29/23 1749 04/29/23 2358 04/30/23 0537  GLUCAP 102* 104* 127* 138* 104*   Lipid Profile: No results for input(s): "CHOL", "HDL", "LDLCALC", "TRIG", "CHOLHDL", "LDLDIRECT" in the last 72 hours. Thyroid Function  Tests: No results for input(s): "TSH", "T4TOTAL", "FREET4", "T3FREE", "THYROIDAB" in the last 72 hours. Anemia Panel: No results for input(s): "VITAMINB12", "FOLATE", "FERRITIN", "TIBC", "IRON", "RETICCTPCT" in the last 72 hours. Sepsis Labs: No results for input(s): "PROCALCITON", "LATICACIDVEN" in the last 168 hours.  Recent Results (from the past 240 hour(s))  Surgical PCR screen     Status: None   Collection Time: 04/26/23  5:43 PM   Specimen: Nasal Mucosa; Nasal Swab  Result Value Ref Range Status   MRSA, PCR NEGATIVE NEGATIVE Final   Staphylococcus aureus NEGATIVE NEGATIVE Final    Comment: (NOTE) The Xpert SA Assay (FDA approved for NASAL specimens in patients 43 years of age and older), is one component of a comprehensive surveillance program. It is not intended to diagnose infection nor to guide or monitor treatment. Performed at [facility], [address], [place], [place] [address]          Radiology Studies: No results found.      Scheduled Meds:  feeding supplement  237 mL Oral BID BM   levothyroxine  50 mcg Oral Q0600   multivitamin  1 tablet Oral QHS   mupirocin ointment  1 Application Nasal BID   polyethylene glycol  17 g Oral Daily   senna-docusate  1 tablet Oral BID   sertraline  100 mg Oral Daily   vitamin B-12  100 mcg Oral Daily   Continuous Infusions:  iron sucrose 500 mg (04/30/23 0957)     LOS: 4 days    Time spent: 30 minutes    Dorcas Carrow, MD [facility]

## 2023-05-01 DIAGNOSIS — I129 Hypertensive chronic kidney disease with stage 1 through stage 4 chronic kidney disease, or unspecified chronic kidney disease: Secondary | ICD-10-CM | POA: Diagnosis not present

## 2023-05-01 DIAGNOSIS — K219 Gastro-esophageal reflux disease without esophagitis: Secondary | ICD-10-CM | POA: Diagnosis not present

## 2023-05-01 DIAGNOSIS — E440 Moderate protein-calorie malnutrition: Secondary | ICD-10-CM | POA: Diagnosis not present

## 2023-05-01 DIAGNOSIS — W19XXXA Unspecified fall, initial encounter: Secondary | ICD-10-CM | POA: Diagnosis not present

## 2023-05-01 DIAGNOSIS — R2681 Unsteadiness on feet: Secondary | ICD-10-CM | POA: Diagnosis not present

## 2023-05-01 DIAGNOSIS — E039 Hypothyroidism, unspecified: Secondary | ICD-10-CM | POA: Diagnosis not present

## 2023-05-01 DIAGNOSIS — E785 Hyperlipidemia, unspecified: Secondary | ICD-10-CM | POA: Diagnosis not present

## 2023-05-01 DIAGNOSIS — M6282 Rhabdomyolysis: Secondary | ICD-10-CM | POA: Diagnosis not present

## 2023-05-01 DIAGNOSIS — Z853 Personal history of malignant neoplasm of breast: Secondary | ICD-10-CM | POA: Diagnosis not present

## 2023-05-01 DIAGNOSIS — R739 Hyperglycemia, unspecified: Secondary | ICD-10-CM | POA: Diagnosis not present

## 2023-05-01 DIAGNOSIS — F321 Major depressive disorder, single episode, moderate: Secondary | ICD-10-CM | POA: Diagnosis not present

## 2023-05-01 DIAGNOSIS — N184 Chronic kidney disease, stage 4 (severe): Secondary | ICD-10-CM | POA: Diagnosis not present

## 2023-05-01 DIAGNOSIS — F32A Depression, unspecified: Secondary | ICD-10-CM | POA: Diagnosis not present

## 2023-05-01 DIAGNOSIS — S72092D Other fracture of head and neck of left femur, subsequent encounter for closed fracture with routine healing: Secondary | ICD-10-CM | POA: Diagnosis not present

## 2023-05-01 DIAGNOSIS — R41841 Cognitive communication deficit: Secondary | ICD-10-CM | POA: Diagnosis not present

## 2023-05-01 DIAGNOSIS — M1990 Unspecified osteoarthritis, unspecified site: Secondary | ICD-10-CM | POA: Diagnosis not present

## 2023-05-01 DIAGNOSIS — M069 Rheumatoid arthritis, unspecified: Secondary | ICD-10-CM | POA: Diagnosis not present

## 2023-05-01 DIAGNOSIS — M8580 Other specified disorders of bone density and structure, unspecified site: Secondary | ICD-10-CM | POA: Diagnosis not present

## 2023-05-01 DIAGNOSIS — M6281 Muscle weakness (generalized): Secondary | ICD-10-CM | POA: Diagnosis not present

## 2023-05-01 DIAGNOSIS — S72002A Fracture of unspecified part of neck of left femur, initial encounter for closed fracture: Secondary | ICD-10-CM | POA: Diagnosis not present

## 2023-05-01 DIAGNOSIS — R2689 Other abnormalities of gait and mobility: Secondary | ICD-10-CM | POA: Diagnosis not present

## 2023-05-01 DIAGNOSIS — G4700 Insomnia, unspecified: Secondary | ICD-10-CM | POA: Diagnosis not present

## 2023-05-01 DIAGNOSIS — S62639A Displaced fracture of distal phalanx of unspecified finger, initial encounter for closed fracture: Secondary | ICD-10-CM | POA: Diagnosis not present

## 2023-05-01 DIAGNOSIS — S60012A Contusion of left thumb without damage to nail, initial encounter: Secondary | ICD-10-CM | POA: Diagnosis not present

## 2023-05-01 DIAGNOSIS — Z9181 History of falling: Secondary | ICD-10-CM | POA: Diagnosis not present

## 2023-05-01 DIAGNOSIS — F5101 Primary insomnia: Secondary | ICD-10-CM | POA: Diagnosis not present

## 2023-05-01 DIAGNOSIS — R531 Weakness: Secondary | ICD-10-CM | POA: Diagnosis not present

## 2023-05-01 DIAGNOSIS — K589 Irritable bowel syndrome without diarrhea: Secondary | ICD-10-CM | POA: Diagnosis not present

## 2023-05-01 DIAGNOSIS — Z7401 Bed confinement status: Secondary | ICD-10-CM | POA: Diagnosis not present

## 2023-05-01 DIAGNOSIS — S72142D Displaced intertrochanteric fracture of left femur, subsequent encounter for closed fracture with routine healing: Secondary | ICD-10-CM | POA: Diagnosis not present

## 2023-05-01 LAB — CBC WITH DIFFERENTIAL/PLATELET
Abs Immature Granulocytes: 0.08 10*3/uL — ABNORMAL HIGH (ref 0.00–0.07)
Basophils Absolute: 0.1 10*3/uL (ref 0.0–0.1)
Basophils Relative: 1 %
Eosinophils Absolute: 0.4 10*3/uL (ref 0.0–0.5)
Eosinophils Relative: 4 %
HCT: 24.7 % — ABNORMAL LOW (ref 36.0–46.0)
Hemoglobin: 8 g/dL — ABNORMAL LOW (ref 12.0–15.0)
Immature Granulocytes: 1 %
Lymphocytes Relative: 14 %
Lymphs Abs: 1.3 10*3/uL (ref 0.7–4.0)
MCH: 31.3 pg (ref 26.0–34.0)
MCHC: 32.4 g/dL (ref 30.0–36.0)
MCV: 96.5 fL (ref 80.0–100.0)
Monocytes Absolute: 1.1 10*3/uL — ABNORMAL HIGH (ref 0.1–1.0)
Monocytes Relative: 12 %
Neutro Abs: 6.4 10*3/uL (ref 1.7–7.7)
Neutrophils Relative %: 68 %
Platelets: 246 10*3/uL (ref 150–400)
RBC: 2.56 MIL/uL — ABNORMAL LOW (ref 3.87–5.11)
RDW: 14 % (ref 11.5–15.5)
WBC: 9.4 10*3/uL (ref 4.0–10.5)
nRBC: 0 % (ref 0.0–0.2)

## 2023-05-01 LAB — GLUCOSE, CAPILLARY: Glucose-Capillary: 126 mg/dL — ABNORMAL HIGH (ref 70–99)

## 2023-05-01 MED ORDER — ADULT MULTIVITAMIN W/MINERALS CH: 1.0000 | ORAL_TABLET | Freq: Every day | ORAL | Status: AC

## 2023-05-01 MED ORDER — FLEET ENEMA RE ENEM
1.0000 | ENEMA | Freq: Once | RECTAL | Status: AC
  Administered 2023-05-01: 1 via RECTAL
  Filled 2023-05-01: qty 1

## 2023-05-01 MED ORDER — SENNOSIDES-DOCUSATE SODIUM 8.6-50 MG PO TABS: 1.0000 | ORAL_TABLET | Freq: Two times a day (BID) | ORAL | Status: AC

## 2023-05-01 MED ORDER — ZOLPIDEM TARTRATE 5 MG PO TABS: 5.0000 mg | ORAL_TABLET | Freq: Every evening | ORAL | 0 refills | Status: DC | PRN

## 2023-05-01 MED ORDER — ENOXAPARIN SODIUM 40 MG/0.4ML IJ SOSY: 30.0000 mg | PREFILLED_SYRINGE | INTRAMUSCULAR | 0 refills | Status: DC

## 2023-05-01 MED ORDER — ENOXAPARIN SODIUM 30 MG/0.3ML IJ SOSY
30.0000 mg | PREFILLED_SYRINGE | INTRAMUSCULAR | Status: DC
  Administered 2023-05-01: 30 mg via SUBCUTANEOUS
  Filled 2023-05-01: qty 0.3

## 2023-05-01 MED ORDER — BISACODYL 10 MG RE SUPP
10.0000 mg | Freq: Once | RECTAL | Status: AC
  Administered 2023-05-01: 10 mg via RECTAL
  Filled 2023-05-01: qty 1

## 2023-05-01 MED ORDER — OXYCODONE HCL 5 MG PO TABS: 2.5000 mg | ORAL_TABLET | ORAL | 0 refills | Status: DC | PRN

## 2023-05-01 MED ORDER — POLYETHYLENE GLYCOL 3350 17 G PO PACK: 17.0000 g | PACK | Freq: Every day | ORAL | Status: DC

## 2023-05-01 NOTE — Progress Notes (Signed)
Physical Therapy Treatment Patient Details Name: Jeanne Tran MRN: [id number] DOB: 1933-10-18 Today's Date: 05/01/2023   History of Present Illness Patient is a 89 year old female admitted with L hip fx. S/P IM nailing 04/26/23. CT(+) head contusion, occipital hematoma. Hx of breast Ca, OA, RA, renal insufficiency, RLS, anxiety, migraines, esophageal stricture    PT Comments  Pt cooperative but with continued inconsistent following of cues and requiring increased time for all tasks.  This am, pt performed basic therex with assist, up to EOB for sitting balance and step/pvt bed to recliner with use of RW.      If plan is discharge home, recommend the following: Two people to help with bathing/dressing/bathroom;Two people to help with walking and/or transfers   Can travel by private vehicle     No  Equipment Recommendations  None recommended by PT    Recommendations for Other Services       Precautions / Restrictions Precautions Precautions: Fall Restrictions Weight Bearing Restrictions: No LLE Weight Bearing: Weight bearing as tolerated     Mobility  Bed Mobility Overal bed mobility: Needs Assistance Bed Mobility: Supine to Sit     Supine to sit: Max assist, +2 for physical assistance, +2 for safety/equipment     General bed mobility comments: Increased time with cues for sequence and use of R LE to self assist. Physical assist to manage LEs, control trunk and complete rotation to EOB sitting with bed pad    Transfers Overall transfer level: Needs assistance Equipment used: Rolling walker (2 wheels) Transfers: Sit to/from Stand, Bed to chair/wheelchair/BSC Sit to Stand: Min assist, Mod assist, +2 physical assistance, +2 safety/equipment, From elevated surface   Step pivot transfers: Min assist, Mod assist, +2 physical assistance, +2 safety/equipment, From elevated surface       General transfer comment: Increased time with cues for LE management and use of UEs  to self assist; step pvt with RW bed to recliner    Ambulation/Gait               General Gait Details: step pvt bed to recliner only   Stairs             Wheelchair Mobility     Tilt Bed    Modified Rankin (Stroke Patients Only)       Balance Overall balance assessment: Needs assistance Sitting-balance support: Bilateral upper extremity supported, Feet supported Sitting balance-Leahy Scale: Poor Sitting balance - Comments: posterior drift Postural control: Posterior lean Standing balance support: Bilateral upper extremity supported Standing balance-Leahy Scale: Poor                              Cognition Arousal: Alert Behavior During Therapy: Flat affect Overall Cognitive Status: No family/caregiver present to determine baseline cognitive functioning Area of Impairment: Orientation, Following commands, Problem solving                       Following Commands: Follows one step commands inconsistently     Problem Solving: Slow processing, Decreased initiation, Requires verbal cues, Requires tactile cues, Difficulty sequencing          Exercises General Exercises - Lower Extremity Ankle Circles/Pumps: AROM, Both, 10 reps Quad Sets: AROM, Both, 10 reps Heel Slides: AAROM, Left, Supine, 15 reps Hip ABduction/ADduction: AAROM, Left, 10 reps, Supine    General Comments        Pertinent Vitals/Pain Pain Assessment Pain  Assessment: Faces Faces Pain Scale: Hurts little more Pain Location: L LE with activity Pain Descriptors / Indicators: Grimacing, Operative site guarding Pain Intervention(s): Limited activity within patient's tolerance, Monitored during session, Premedicated before session    Home Living                          Prior Function            PT Goals (current goals can now be found in the care plan section) Acute Rehab PT Goals Patient Stated Goal: none stated PT Goal Formulation: Patient unable  to participate in goal setting Time For Goal Achievement: 05/11/23 Potential to Achieve Goals: Good Progress towards PT goals: Progressing toward goals    Frequency    Min 1X/week      PT Plan      Co-evaluation              AM-PAC PT "6 Clicks" Mobility   Outcome Measure  Help needed turning from your back to your side while in a flat bed without using bedrails?: Total Help needed moving from lying on your back to sitting on the side of a flat bed without using bedrails?: Total Help needed moving to and from a bed to a chair (including a wheelchair)?: Total Help needed standing up from a chair using your arms (e.g., wheelchair or bedside chair)?: Total Help needed to walk in hospital room?: Total Help needed climbing 3-5 steps with a railing? : Total 6 Click Score: 6    End of Session Equipment Utilized During Treatment: Gait belt Activity Tolerance: Patient tolerated treatment well;Patient limited by fatigue Patient left: in chair;with call bell/phone within reach;with chair alarm set Nurse Communication: Mobility status PT Visit Diagnosis: Pain;History of falling (Z91.81);Other abnormalities of gait and mobility (R26.89) Pain - Right/Left: Left Pain - part of body: Leg     Time: 4098-1191 PT Time Calculation (min) (ACUTE ONLY): 31 min  Charges:    $Therapeutic Exercise: 8-22 mins $Therapeutic Activity: 8-22 mins PT General Charges $$ ACUTE PT VISIT: 1 Visit                     Mauro Kaufmann PT Acute Rehabilitation Services Pager [phone] Office [phone]    , 05/01/2023, 12:03 PM

## 2023-05-01 NOTE — Discharge Summary (Signed)
Physician Discharge Summary  Jeanne Tran:[id number] DOB: 06-Nov-1933 DOA: 04/25/2023  PCP: Madelin Headings, MD  Admit date: 04/25/2023 Discharge date: 05/01/2023  Admitted From: ALF Disposition: Skilled nursing facility  Recommendations for Outpatient Follow-up:  Follow up with PCP in 1-2 weeks Please obtain BMP/CBC in one week Orthopedics to schedule follow-up  Home Health: N/A Equipment/Devices: N/A  Discharge Condition: Fair CODE STATUS: Full code Diet recommendation: Low-salt diet  Discharge summary: 89 year old with history of breast cancer, GERD, migraine, restless leg syndrome brought to the ER from her assisted living facility with fall, unable to call for help and on the floor for some time.  In the emergency room skeletal survey including CT scan of the head with a small extracranial hematoma left occipital bone.  Left hip fracture.  CK level elevated.     Assessment & Plan:   Closed traumatic intertrochanteric fracture of the left hip: Status post IM nailing 8/10 Weightbearing as tolerated Pain medications, Tylenol and oxycodone along with bowel regimen. DVT prophylaxis, Lovenox for 4 weeks. Postop follow-up as per surgery.   Rhabdomyolysis: Presented with CK level of 2700.  Downtrending.  IV fluids discontinued.  She is able to keep up oral hydration.    Acute blood loss anemia, surgical and dilutional.  Hemoglobin 8-7.9-7.5-7.2-8.  Currently no indication for transfusion. Patient was given iron sucrose 500 mg once while in the hospital.   Chronic medical issues including CKD stage IV, at baseline Hypothyroidism, on levothyroxine Restless leg syndrome, on sertraline 100 mg daily.  Insomnia, occasional use of Ambien at night.  Try to avoid as much possible.  Constipation: Significant issues.  Suppository, Fleet enema today.  Continue with stool softener and laxative on discharge.   Medically stable to transfer to a SNF to continue inpatient therapies  .  Discharge Diagnoses:  Principal Problem:   Closed left hip fracture, initial encounter [facility]) Active Problems:   GERD   Hyperlipidemia   Rhabdomyolysis   Hypothyroidism   CKD (chronic kidney disease) stage 4, GFR 15-29 ml/min (HCC)   Hyperglycemia   Malnutrition of moderate degree    Discharge Instructions  Discharge Instructions     Diet general   Complete by: As directed    Increase activity slowly   Complete by: As directed       Allergies as of 05/01/2023       Reactions   Codeine Other (See Comments)   REACTION: Intolerance w/ pain meds not cough syrup. Codeine makes her bounce off the walls. falling REACTION: Intolerance w/ pain meds not cough syrup. Codeine makes her bounce off the walls.   Aspirin Other (See Comments)   GI issues GI issues        Medication List     TAKE these medications    CALCIUM PO Take 1 tablet by mouth daily.   enoxaparin 40 MG/0.4ML injection Commonly known as: LOVENOX Inject 0.3 mLs (30 mg total) into the skin daily for 28 days.   levothyroxine 50 MCG tablet Commonly known as: SYNTHROID TAKE 1 TABLET(50 MCG) BY MOUTH DAILY   MELATIN PO Take 1 tablet by mouth at bedtime as needed (sleep).   mirtazapine 15 MG tablet Commonly known as: REMERON TAKE 1 TO 2 TABLETS BY MOUTH AT BEDTIME FOR SLEEP What changed:  how much to take how to take this when to take this   multivitamin with minerals Tabs tablet Take 1 tablet by mouth daily. Start taking on: May 02, 2023   oxyCODONE 5  MG immediate release tablet Commonly known as: Oxy IR/ROXICODONE Take 0.5-1 tablets (2.5-5 mg total) by mouth every 4 (four) hours as needed for severe pain or moderate pain.   polyethylene glycol 17 g packet Commonly known as: MIRALAX / GLYCOLAX Take 17 g by mouth daily. Start taking on: May 02, 2023   senna-docusate 8.6-50 MG tablet Commonly known as: Senokot-S Take 1 tablet by mouth 2 (two) times daily.   sertraline 100 MG  tablet Commonly known as: ZOLOFT TAKE 1 TABLET(100 MG) BY MOUTH DAILY What changed: See the new instructions.   vitamin B-12 100 MCG tablet Commonly known as: CYANOCOBALAMIN Take 100 mcg by mouth daily.   zolpidem 5 MG tablet Commonly known as: AMBIEN Take 1 tablet (5 mg total) by mouth at bedtime as needed for up to 7 days for sleep. What changed:  medication strength how much to take how to take this when to take this reasons to take this additional instructions        Contact information for follow-up providers     Joen Laura, MD Follow up in 2 week(s).   Specialty: Orthopedic Surgery Contact information: [address] [place] [address] [phone]              Contact information for after-discharge care     Destination     [facility] Preferred SNF .   Service: Skilled Nursing Contact information: [address] [place] [address] [phone]                    Allergies  Allergen Reactions   Codeine Other (See Comments)    REACTION: Intolerance w/ pain meds not cough syrup. Codeine makes her bounce off the walls. falling REACTION: Intolerance w/ pain meds not cough syrup. Codeine makes her bounce off the walls.   Aspirin Other (See Comments)    GI issues GI issues    Consultations: Orthopedics   Procedures/Studies: DG HIP UNILAT W OR W/O PELVIS 2-3 VIEWS LEFT  Result Date: 04/27/2023 CLINICAL DATA:  252351 Post-operative state 252351 EXAM: DG HIP (WITH OR WITHOUT PELVIS) 2-3V LEFT COMPARISON:  04/25/2023 left hip radiographs FINDINGS: Status post interval transfixation in near-anatomic alignment of comminuted intertrochanteric left proximal femur fracture with intramedullary rod and interlocking left femoral neck pins with distal interlocking screw. No left hip dislocation. No pelvic diastasis. Mild bilateral hip osteoarthritis. No suspicious focal osseous lesions. Marked lower  lumbar degenerative disc disease. IMPRESSION: Status post interval transfixation of comminuted intertrochanteric left proximal femur fracture in near-anatomic alignment. Electronically Signed   By: Delbert Phenix M.D.   On: 04/27/2023 12:17   DG FEMUR MIN 2 VIEWS LEFT  Result Date: 04/26/2023 CLINICAL DATA:  Left intratrochanteric femoral fracture EXAM: LEFT FEMUR 2 VIEWS COMPARISON:  Films from earlier in the same day. FLUOROSCOPY TIME:  Radiation Exposure Index (as provided by the fluoroscopic device): 6.92 mGy If the device does not provide the exposure index: Fluoroscopy Time:  52 seconds Number of Acquired Images:  7 FINDINGS: Proximal short medullary rod is noted with fixation screws traversing the femoral neck as well as in the femoral shaft. Fracture fragments are in anatomic alignment. IMPRESSION: ORIF of right femoral fracture. Electronically Signed   By: Alcide Clever M.D.   On: 04/26/2023 20:58   DG C-Arm 1-60 Min-No Report  Result Date: 04/26/2023 Fluoroscopy was utilized by the requesting physician.  No radiographic interpretation.   ECHOCARDIOGRAM COMPLETE  Result Date:  04/26/2023    ECHOCARDIOGRAM REPORT   Patient Name:   ARIYONA KULL Date of Exam: 04/26/2023 Medical Rec #:  [id number]          Height:       64.0 in Accession #:    [id number]         Weight:       137.1 lb Date of Birth:  1933/12/25          BSA:          1.666 m Patient Age:    89 years           BP:           180/128 mmHg Patient Gender: F                  HR:           87 bpm. Exam Location:  Inpatient Procedure: 2D Echo, Color Doppler and Cardiac Doppler Indications:    Abnormal EKG, Preop eval  History:        Patient has no prior history of Echocardiogram examinations.                 Left hip fx, Arrythmias:Abnormal ECG; Risk Factors:Dyslipidemia.  Sonographer:    Milbert Coulter Referring Phys: [id number] DAVID MANUEL ORTIZ IMPRESSIONS  1. Left ventricular ejection fraction, by estimation, is 60 to 65%. The left  ventricle has normal function. The left ventricle has no regional wall motion abnormalities. There is mild concentric left ventricular hypertrophy. Left ventricular diastolic parameters are consistent with Grade I diastolic dysfunction (impaired relaxation).  2. Right ventricular systolic function is normal. The right ventricular size is normal.  3. The mitral valve is normal in structure. Mild to moderate mitral valve regurgitation. No evidence of mitral stenosis.  4. The aortic valve is grossly normal. There is mild calcification of the aortic valve. Aortic valve regurgitation is not visualized. Aortic valve sclerosis is present, with no evidence of aortic valve stenosis.  5. The inferior vena cava is normal in size with greater than 50% respiratory variability, suggesting right atrial pressure of 3 mmHg. Comparison(s): No prior Echocardiogram. FINDINGS  Left Ventricle: Left ventricular ejection fraction, by estimation, is 60 to 65%. The left ventricle has normal function. The left ventricle has no regional wall motion abnormalities. The left ventricular internal cavity size was normal in size. There is  mild concentric left ventricular hypertrophy. Left ventricular diastolic parameters are consistent with Grade I diastolic dysfunction (impaired relaxation). Right Ventricle: The right ventricular size is normal. No increase in right ventricular wall thickness. Right ventricular systolic function is normal. Left Atrium: Left atrial size was normal in size. Right Atrium: Right atrial size was normal in size. Pericardium: There is no evidence of pericardial effusion. Mitral Valve: The mitral valve is normal in structure. Mild mitral annular calcification. Mild to moderate mitral valve regurgitation. No evidence of mitral valve stenosis. Tricuspid Valve: The tricuspid valve is normal in structure. Tricuspid valve regurgitation is not demonstrated. No evidence of tricuspid stenosis. Aortic Valve: The aortic valve is  grossly normal. There is mild calcification of the aortic valve. Aortic valve regurgitation is not visualized. Aortic valve sclerosis is present, with no evidence of aortic valve stenosis. Aortic valve mean gradient measures 4.0 mmHg. Aortic valve peak gradient measures 8.0 mmHg. Aortic valve area, by VTI measures 1.79 cm. Pulmonic Valve: The pulmonic valve was normal in structure. Pulmonic valve regurgitation is not visualized. No evidence of pulmonic stenosis.  Aorta: The aortic root and ascending aorta are structurally normal, with no evidence of dilitation. Venous: The inferior vena cava is normal in size with greater than 50% respiratory variability, suggesting right atrial pressure of 3 mmHg. IAS/Shunts: No atrial level shunt detected by color flow Doppler.  LEFT VENTRICLE PLAX 2D LVIDd:         3.70 cm   Diastology LVIDs:         2.50 cm   LV e' medial:    5.77 cm/s LV PW:         1.20 cm   LV E/e' medial:  10.7 LV IVS:        1.20 cm   LV e' lateral:   7.72 cm/s LVOT diam:     2.00 cm   LV E/e' lateral: 8.0 LV SV:         46 LV SV Index:   28 LVOT Area:     3.14 cm  RIGHT VENTRICLE RV Basal diam:  2.50 cm RV Mid diam:    1.90 cm RV S prime:     16.60 cm/s TAPSE (M-mode): 1.9 cm LEFT ATRIUM             Index        RIGHT ATRIUM          Index LA diam:        3.90 cm 2.34 cm/m   RA Area:     9.34 cm LA Vol (A2C):   35.9 ml 21.54 ml/m  RA Volume:   15.30 ml 9.18 ml/m LA Vol (A4C):   30.1 ml 18.06 ml/m LA Biplane Vol: 32.9 ml 19.74 ml/m  AORTIC VALVE AV Area (Vmax):    2.06 cm AV Area (Vmean):   2.04 cm AV Area (VTI):     1.79 cm AV Vmax:           141.00 cm/s AV Vmean:          96.500 cm/s AV VTI:            0.260 m AV Peak Grad:      8.0 mmHg AV Mean Grad:      4.0 mmHg LVOT Vmax:         92.50 cm/s LVOT Vmean:        62.600 cm/s LVOT VTI:          0.148 m LVOT/AV VTI ratio: 0.57  AORTA Ao Root diam: 2.90 cm Ao Asc diam:  2.80 cm MITRAL VALVE MV Area (PHT): 3.30 cm     SHUNTS MV Decel Time: 230 msec      Systemic VTI:  0.15 m MV E velocity: 61.70 cm/s   Systemic Diam: 2.00 cm MV A velocity: 103.00 cm/s MV E/A ratio:  0.60 Riley Lam MD Electronically signed by Riley Lam MD Signature Date/Time: 04/26/2023/3:41:00 PM    Final    DG FEMUR MIN 2 VIEWS LEFT  Result Date: 04/26/2023 CLINICAL DATA:  [address] Fracture [id number] EXAM: LEFT FEMUR 2 VIEWS COMPARISON:  X-ray left hip 04/25/2023 FINDINGS: Acute superiorly displaced and impacted left intertrochanteric fracture. Distally no acute femoral fracture. Least moderate degenerative changes of the medial tibiofemoral compartment. Knee grossly unremarkable for traumatic injury. Nonstandard views with the entire femoral shaft possibly not completely visualized. Soft tissues are unremarkable. IMPRESSION: 1. Acute superiorly displaced and impacted left intertrochanteric fracture. Distally no acute femoral fracture; however, nonstandard views with the entire femoral shaft possibly not completely visualized. 2. Knee grossly unremarkable for traumatic  injury. Electronically Signed   By: Tish Frederickson M.D.   On: 04/26/2023 01:01   DG Chest 1 View  Result Date: 04/25/2023 CLINICAL DATA:  Fall, hip fracture EXAM: CHEST  1 VIEW COMPARISON:  07/17/2013 FINDINGS: Lungs are clear.  No pleural effusion or pneumothorax. The heart is normal in size.  Thoracic aortic atherosclerosis. Surgical clips along the left chest wall/axilla. IMPRESSION: No acute cardiopulmonary disease. Electronically Signed   By: Charline Bills M.D.   On: 04/25/2023 23:39   DG Hip Unilat W or Wo Pelvis 2-3 Views Left  Result Date: 04/25/2023 CLINICAL DATA:  Fall, hip fracture EXAM: DG HIP (WITH OR WITHOUT PELVIS) 2-3V LEFT COMPARISON:  None Available. FINDINGS: Comminuted intertrochanteric left hip fracture with foreshortening and varus angulation. Right hip is intact. Bilateral hip joint spaces are preserved. Visualized bony pelvis appears intact. Mild degenerative changes of the  lower lumbar spine. IMPRESSION: Comminuted intertrochanteric left hip fracture, as above. Electronically Signed   By: Charline Bills M.D.   On: 04/25/2023 23:38   CT HEAD WO CONTRAST ( )  Result Date: 04/25/2023 CLINICAL DATA:  Fall, posterior head hematoma EXAM: CT HEAD WITHOUT CONTRAST CT CERVICAL SPINE WITHOUT CONTRAST TECHNIQUE: Multidetector CT imaging of the head and cervical spine was performed following the standard protocol without intravenous contrast. Multiplanar CT image reconstructions of the cervical spine were also generated. RADIATION DOSE REDUCTION: This exam was performed according to the departmental dose-optimization program which includes automated exposure control, adjustment of the mA and/or kV according to patient size and/or use of iterative reconstruction technique. COMPARISON:  None Available. FINDINGS: CT HEAD FINDINGS Brain: No evidence of acute infarction, hemorrhage, hydrocephalus, extra-axial collection or mass lesion/mass effect. Age related atrophy. Subcortical white matter and periventricular small vessel ischemic changes. Vascular: Mild intracranial atherosclerosis. Skull: Normal. Negative for fracture or focal lesion. Sinuses/Orbits: The visualized paranasal sinuses are essentially clear. The mastoid air cells are unopacified. Other: Small extracranial hematoma overlying the left occipital bone (series 2/image 14). CT CERVICAL SPINE FINDINGS Alignment: Normal cervical lordosis. Skull base and vertebrae: No acute fracture. No primary bone lesion or focal pathologic process. Soft tissues and spinal canal: No prevertebral fluid or swelling. No visible canal hematoma. Disc levels: C4-6 ACDF, without evidence of complication. Mild degenerative changes at C3-4 and C6-7. Spinal canal is patent. Upper chest: Visualized lung apices are notable for moderate centrilobular and paraseptal emphysematous changes. Calcified granuloma in the left lung apex, benign. Other: Visualized  thyroid is unremarkable. IMPRESSION: Small extracranial hematoma overlying the left occipital bone. No evidence of calvarial fracture. No acute intracranial abnormality. Atrophy with small vessel ischemic changes. No traumatic injury to the cervical spine. C4-6 ACDF, without evidence of complication. Mild degenerative changes. Electronically Signed   By: Charline Bills M.D.   On: 04/25/2023 23:29   CT Cervical Spine Wo Contrast  Result Date: 04/25/2023 CLINICAL DATA:  Fall, posterior head hematoma EXAM: CT HEAD WITHOUT CONTRAST CT CERVICAL SPINE WITHOUT CONTRAST TECHNIQUE: Multidetector CT imaging of the head and cervical spine was performed following the standard protocol without intravenous contrast. Multiplanar CT image reconstructions of the cervical spine were also generated. RADIATION DOSE REDUCTION: This exam was performed according to the departmental dose-optimization program which includes automated exposure control, adjustment of the mA and/or kV according to patient size and/or use of iterative reconstruction technique. COMPARISON:  None Available. FINDINGS: CT HEAD FINDINGS Brain: No evidence of acute infarction, hemorrhage, hydrocephalus, extra-axial collection or mass lesion/mass effect. Age related atrophy. Subcortical white matter and  periventricular small vessel ischemic changes. Vascular: Mild intracranial atherosclerosis. Skull: Normal. Negative for fracture or focal lesion. Sinuses/Orbits: The visualized paranasal sinuses are essentially clear. The mastoid air cells are unopacified. Other: Small extracranial hematoma overlying the left occipital bone (series 2/image 14). CT CERVICAL SPINE FINDINGS Alignment: Normal cervical lordosis. Skull base and vertebrae: No acute fracture. No primary bone lesion or focal pathologic process. Soft tissues and spinal canal: No prevertebral fluid or swelling. No visible canal hematoma. Disc levels: C4-6 ACDF, without evidence of complication. Mild  degenerative changes at C3-4 and C6-7. Spinal canal is patent. Upper chest: Visualized lung apices are notable for moderate centrilobular and paraseptal emphysematous changes. Calcified granuloma in the left lung apex, benign. Other: Visualized thyroid is unremarkable. IMPRESSION: Small extracranial hematoma overlying the left occipital bone. No evidence of calvarial fracture. No acute intracranial abnormality. Atrophy with small vessel ischemic changes. No traumatic injury to the cervical spine. C4-6 ACDF, without evidence of complication. Mild degenerative changes. Electronically Signed   By: Charline Bills M.D.   On: 04/25/2023 23:29   (Echo, Carotid, EGD, Colonoscopy, ERCP)    Subjective: Patient seen and examined.  She had slight impulsiveness and confusion overnight but currently denies any complaints.  Pain is controlled.   Discharge Exam: Vitals:   04/30/23 2358 05/01/23 0355  BP: 123/62 115/62  Pulse: 84 83  Resp: 19 19  Temp: 98.9 F (37.2 C) 98.2 F (36.8 C)  SpO2: 94% 93%   Vitals:   04/30/23 1713 04/30/23 2026 04/30/23 2358 05/01/23 0355  BP: 120/63 (!) 147/79 123/62 115/62  Pulse: 82 80 84 83  Resp: 18 16 19 19   Temp: 98.4 F (36.9 C) 98.2 F (36.8 C) 98.9 F (37.2 C) 98.2 F (36.8 C)  TempSrc: Oral Oral Oral Oral  SpO2: 100% 95% 94% 93%  Weight:      Height:        General: Looks comfortable.  She does have slight cognitive delay. Alert awake and oriented.  Flat affect. Cardiovascular: RRR, S1/S2 +, no rubs, no gallops Respiratory: CTA bilaterally, no wheezing, no rhonchi Abdominal: Soft, NT, ND, bowel sounds + Extremities: no cyanosis Left lateral thigh incisions clean and dry, minimal edema.    The results of significant diagnostics from this hospitalization (including imaging, microbiology, ancillary and laboratory) are listed below for reference.     Microbiology: Recent Results (from the past 240 hour(s))  Surgical PCR screen     Status: None    Collection Time: 04/26/23  5:43 PM   Specimen: Nasal Mucosa; Nasal Swab  Result Value Ref Range Status   MRSA, PCR NEGATIVE NEGATIVE Final   Staphylococcus aureus NEGATIVE NEGATIVE Final    Comment: (NOTE) The Xpert SA Assay (FDA approved for NASAL specimens in patients 48 years of age and older), is one component of a comprehensive surveillance program. It is not intended to diagnose infection nor to guide or monitor treatment. Performed at [facility], [address], [place], [place] [address]      Labs: BNP (last 3 results) No results for input(s): "BNP" in the last 8760 hours. Basic Metabolic Panel: Recent Labs  Lab 04/26/23 0843 04/27/23 0353 04/28/23 0356 04/29/23 0430 04/30/23 0357  NA 139 134* 134* 134* 134*  K 4.0 4.6 4.1 3.5 3.8  CL 106 100 103 102 101  CO2 22 26 25 25 26   GLUCOSE 120* 171* 127* 121* 110*  BUN 40* 36* 31* 26* 33*  CREATININE 1.75* 1.87* 1.60* 1.41* 1.52*  CALCIUM 8.6* 8.4* 8.0* 8.4* 8.4*   Liver Function Tests: Recent Labs  Lab 04/25/23 2307 04/27/23 0353  AST 37 85*  ALT 26 36  ALKPHOS 103 79  BILITOT 0.6 0.5  PROT 6.6 5.6*  ALBUMIN 3.8 3.1*   No results for input(s): "LIPASE", "AMYLASE" in the last 168 hours. No results for input(s): "AMMONIA" in the last 168 hours. CBC: Recent Labs  Lab 04/25/23 2307 04/27/23 0353 04/28/23 0356 04/29/23 0430 04/30/23 0357 05/01/23 0348  WBC 13.6* 11.0* 10.8* 10.6* 8.9 9.4  NEUTROABS 12.0*  --  8.2* 7.8* 6.2 6.4  HGB 12.0 8.9* 7.9* 7.5* 7.2* 8.0*  HCT 37.1 27.4* 25.1* 23.7* 22.3* 24.7*  MCV 95.6 97.2 98.4 97.1 95.7 96.5  PLT 181 162 152 159 191 246   Cardiac Enzymes: Recent Labs  Lab 04/26/23 0843 04/27/23 0353 04/28/23 0356 04/29/23 0430 04/30/23 0357  CKTOTAL 2,707* 2,256* 1,763* 1,023* 438*   BNP: Invalid input(s): "POCBNP" CBG: Recent Labs  Lab 04/30/23 0537 04/30/23 1206 04/30/23 1831 04/30/23 2355 05/01/23 0559  GLUCAP 104* 162* 139* 126* 126*    D-Dimer No results for input(s): "DDIMER" in the last 72 hours. Hgb A1c No results for input(s): "HGBA1C" in the last 72 hours.  Lipid Profile No results for input(s): "CHOL", "HDL", "LDLCALC", "TRIG", "CHOLHDL", "LDLDIRECT" in the last 72 hours. Thyroid function studies No results for input(s): "TSH", "T4TOTAL", "T3FREE", "THYROIDAB" in the last 72 hours.  Invalid input(s): "FREET3" Anemia work up No results for input(s): "VITAMINB12", "FOLATE", "FERRITIN", "TIBC", "IRON", "RETICCTPCT" in the last 72 hours. Urinalysis    Component Value Date/Time   COLORURINE YELLOW 01/24/2022 1302   APPEARANCEUR CLEAR 01/24/2022 1302   LABSPEC 1.015 01/24/2022 1302   PHURINE 6.0 01/24/2022 1302   GLUCOSEU NEGATIVE 01/24/2022 1302   HGBUR NEGATIVE 01/24/2022 1302   HGBUR negative 07/02/2010 1102   BILIRUBINUR NEGATIVE 01/24/2022 1302   BILIRUBINUR Negative 05/02/2020 1026   KETONESUR NEGATIVE 01/24/2022 1302   PROTEINUR Negative 05/02/2020 1026   PROTEINUR 100 (A) 07/17/2013 2313   UROBILINOGEN 0.2 01/24/2022 1302   NITRITE NEGATIVE 01/24/2022 1302   LEUKOCYTESUR NEGATIVE 01/24/2022 1302   Sepsis Labs Recent Labs  Lab 04/28/23 0356 04/29/23 0430 04/30/23 0357 05/01/23 0348  WBC 10.8* 10.6* 8.9 9.4   Microbiology Recent Results (from the past 240 hour(s))  Surgical PCR screen     Status: None   Collection Time: 04/26/23  5:43 PM   Specimen: Nasal Mucosa; Nasal Swab  Result Value Ref Range Status   MRSA, PCR NEGATIVE NEGATIVE Final   Staphylococcus aureus NEGATIVE NEGATIVE Final    Comment: (NOTE) The Xpert SA Assay (FDA approved for NASAL specimens in patients 30 years of age and older), is one component of a comprehensive surveillance program. It is not intended to diagnose infection nor to guide or monitor treatment. Performed at [facility], [address], [place], [place] [address]      Time coordinating discharge: 35  minutes  SIGNED:   Dorcas Carrow, MD  [facility] 05/01/2023, 10:49 AM

## 2023-05-01 NOTE — TOC Transition Note (Addendum)
Transition of Care [facility]) - CM/SW Discharge Note   Patient Details  Name: Jeanne Tran MRN: [id number] Date of Birth: Nov 02, 1933  Transition of Care [facility]) CM/SW Contact:  Howell Rucks, RN Phone Number: 05/01/2023, 11:14 AM   Clinical Narrative:  DC to [facility] , RM [facility], nurse call for report [phone],  PTAR for transport, pt's dtr Luann notified. No further TOC needs identified.      Final next level of care: Skilled Nursing Facility Barriers to Discharge: No Barriers Identified   Patient Goals and CMS Choice CMS Medicare.gov Compare Post Acute Care list provided to:: Patient Represenative (must comment) Lorrine Kin (Son)  [phone] (Work Phone) Choice offered to / list presented to : Adult Children  Discharge Placement                Patient chooses bed at: [facility] Patient to be transferred to facility by: PT[facility] Name of family member notified: Luann (dtr) Patient and family notified of of transfer: 05/01/23  Discharge Plan and Services Additional resources added to the After Visit Summary for   In-house Referral: Clinical Social Work                                   Social Determinants of Health (SDOH) Interventions SDOH Screenings   Food Insecurity: No Food Insecurity (04/26/2023)  Housing: Low Risk  (04/26/2023)  Transportation Needs: No Transportation Needs (04/26/2023)  Utilities: Not At Risk (04/26/2023)  Alcohol Screen: Low Risk  (12/14/2021)  Depression (PHQ2-9): Low Risk  (12/24/2022)  Financial Resource Strain: Low Risk  (01/16/2022)  Physical Activity: Inactive (12/14/2021)  Social Connections: Unknown (01/29/2022)   [facility] from [facility]  Recent Concern: Social Connections - Socially Isolated (12/14/2021)  Stress: No Stress Concern Present (12/14/2021)  Tobacco Use: Medium Risk (04/25/2023)     Readmission Risk Interventions    05/01/2023   11:11 AM  Readmission Risk Prevention Plan  Transportation  Screening Complete  PCP or Specialist Appt within 5-7 Days Complete  Home Care Screening Complete  Medication Review (RN CM) Complete

## 2023-05-01 NOTE — Progress Notes (Signed)
Patient discharging to SNF, report called to Sue Lush at [facility].  IV removed - WNL.  AVS placed in packet for transport. Patient in NAD at this time and updated on plan of care.

## 2023-05-01 NOTE — Progress Notes (Signed)
    5 Days Post-Op Procedure(s) (LRB): INTRAMEDULLARY (IM) NAIL INTERTROCHANTERIC (Left)  Subjective: Patient more alert this morning, but remains confused about the situation.  Hemoglobin improved to 8.0.  Okay to resume Lovenox.  Patient did not work with physical therapy yesterday..  Objective:   VITALS:   Vitals:   04/30/23 1713 04/30/23 2026 04/30/23 2358 05/01/23 0355  BP: 120/63 (!) 147/79 123/62 115/62  Pulse: 82 80 84 83  Resp: 18 16 19 19   Temp: 98.4 F (36.9 C) 98.2 F (36.8 C) 98.9 F (37.2 C) 98.2 F (36.8 C)  TempSrc: Oral Oral Oral Oral  SpO2: 100% 95% 94% 93%  Weight:      Height:        AAOx4, sitting comfortably Neurovascular intact Sensation intact distally Intact pulses distally Dorsiflexion/Plantar flexion intact Incision: dressing C/D/I Compartment soft Wiggles toes appropriately   Lab Results  Component Value Date   WBC 9.4 05/01/2023   HGB 8.0 (L) 05/01/2023   HCT 24.7 (L) 05/01/2023   MCV 96.5 05/01/2023   PLT 246 05/01/2023   BMET    Component Value Date/Time   NA 134 (L) 04/30/2023 0357   NA 141 04/17/2017 1504   K 3.8 04/30/2023 0357   K 5.0 04/17/2017 1504   CL 101 04/30/2023 0357   CO2 26 04/30/2023 0357   CO2 28 04/17/2017 1504   GLUCOSE 110 (H) 04/30/2023 0357   GLUCOSE 91 04/17/2017 1504   BUN 33 (H) 04/30/2023 0357   BUN 27.3 (H) 04/17/2017 1504   CREATININE 1.52 (H) 04/30/2023 0357   CREATININE 1.74 (H) 07/05/2020 1256   CREATININE 1.6 (H) 04/17/2017 1504   CALCIUM 8.4 (L) 04/30/2023 0357   CALCIUM 10.1 04/17/2017 1504   EGFR 31 (L) 04/17/2017 1504   GFRNONAA 33 (L) 04/30/2023 0357   GFRNONAA 26 (L) 07/05/2020 1256     Xray: stable post-op imaging  Assessment/Plan: 5 Days Post-Op   Principal Problem:   Closed left hip fracture, initial encounter (HCC) Active Problems:   GERD   Hyperlipidemia   Rhabdomyolysis   Hypothyroidism   CKD (chronic kidney disease) stage 4, GFR 15-29 ml/min (HCC)    Hyperglycemia   Malnutrition of moderate degree   Post op recs: WB: WBAT LLE Abx: ancef x23 hours post op Imaging: PACU xrays Dressing: keep intact until follow up, change PRN if soiled or saturated. DVT prophylaxis: lovenox starting POD1 x4 weeks -- resume 8/14 DC: per medicine, likely SNF Follow up: 2 weeks after surgery for a wound check with Dr. Blanchie Dessert at [facility].  Address: [address], [place], [place] [address]  Office Phone: [phone]   Jeanne Tran 05/01/2023, 7:17 AM   Weber Cooks, MD  Contact information:   [phone] 7am-5pm epic message Dr. Blanchie Dessert, or call office for patient follow up: [phone] After hours and holidays please check [email or web address] for group call information for [facility]

## 2023-05-02 DIAGNOSIS — M6282 Rhabdomyolysis: Secondary | ICD-10-CM | POA: Diagnosis not present

## 2023-05-02 DIAGNOSIS — S72092D Other fracture of head and neck of left femur, subsequent encounter for closed fracture with routine healing: Secondary | ICD-10-CM | POA: Diagnosis not present

## 2023-05-02 DIAGNOSIS — M6281 Muscle weakness (generalized): Secondary | ICD-10-CM | POA: Diagnosis not present

## 2023-05-05 DIAGNOSIS — N184 Chronic kidney disease, stage 4 (severe): Secondary | ICD-10-CM | POA: Diagnosis not present

## 2023-05-05 DIAGNOSIS — F5101 Primary insomnia: Secondary | ICD-10-CM | POA: Diagnosis not present

## 2023-05-05 DIAGNOSIS — R2681 Unsteadiness on feet: Secondary | ICD-10-CM | POA: Diagnosis not present

## 2023-05-05 DIAGNOSIS — M6281 Muscle weakness (generalized): Secondary | ICD-10-CM | POA: Diagnosis not present

## 2023-05-05 DIAGNOSIS — R41841 Cognitive communication deficit: Secondary | ICD-10-CM | POA: Diagnosis not present

## 2023-05-05 DIAGNOSIS — S72092D Other fracture of head and neck of left femur, subsequent encounter for closed fracture with routine healing: Secondary | ICD-10-CM | POA: Diagnosis not present

## 2023-05-05 DIAGNOSIS — F321 Major depressive disorder, single episode, moderate: Secondary | ICD-10-CM | POA: Diagnosis not present

## 2023-05-05 DIAGNOSIS — Z9181 History of falling: Secondary | ICD-10-CM | POA: Diagnosis not present

## 2023-05-08 DIAGNOSIS — R2681 Unsteadiness on feet: Secondary | ICD-10-CM | POA: Diagnosis not present

## 2023-05-08 DIAGNOSIS — M6281 Muscle weakness (generalized): Secondary | ICD-10-CM | POA: Diagnosis not present

## 2023-05-08 DIAGNOSIS — S72092D Other fracture of head and neck of left femur, subsequent encounter for closed fracture with routine healing: Secondary | ICD-10-CM | POA: Diagnosis not present

## 2023-05-08 DIAGNOSIS — Z9181 History of falling: Secondary | ICD-10-CM | POA: Diagnosis not present

## 2023-05-08 DIAGNOSIS — R41841 Cognitive communication deficit: Secondary | ICD-10-CM | POA: Diagnosis not present

## 2023-05-12 DIAGNOSIS — R41841 Cognitive communication deficit: Secondary | ICD-10-CM | POA: Diagnosis not present

## 2023-05-12 DIAGNOSIS — S72092D Other fracture of head and neck of left femur, subsequent encounter for closed fracture with routine healing: Secondary | ICD-10-CM | POA: Diagnosis not present

## 2023-05-12 DIAGNOSIS — M6281 Muscle weakness (generalized): Secondary | ICD-10-CM | POA: Diagnosis not present

## 2023-05-12 DIAGNOSIS — Z9181 History of falling: Secondary | ICD-10-CM | POA: Diagnosis not present

## 2023-05-12 DIAGNOSIS — R2681 Unsteadiness on feet: Secondary | ICD-10-CM | POA: Diagnosis not present

## 2023-05-13 DIAGNOSIS — G4700 Insomnia, unspecified: Secondary | ICD-10-CM | POA: Diagnosis not present

## 2023-05-13 DIAGNOSIS — K219 Gastro-esophageal reflux disease without esophagitis: Secondary | ICD-10-CM | POA: Diagnosis not present

## 2023-05-13 DIAGNOSIS — Z853 Personal history of malignant neoplasm of breast: Secondary | ICD-10-CM | POA: Diagnosis not present

## 2023-05-13 DIAGNOSIS — K589 Irritable bowel syndrome without diarrhea: Secondary | ICD-10-CM | POA: Diagnosis not present

## 2023-05-15 DIAGNOSIS — R41841 Cognitive communication deficit: Secondary | ICD-10-CM | POA: Diagnosis not present

## 2023-05-15 DIAGNOSIS — Z9181 History of falling: Secondary | ICD-10-CM | POA: Diagnosis not present

## 2023-05-15 DIAGNOSIS — M6281 Muscle weakness (generalized): Secondary | ICD-10-CM | POA: Diagnosis not present

## 2023-05-15 DIAGNOSIS — S72092D Other fracture of head and neck of left femur, subsequent encounter for closed fracture with routine healing: Secondary | ICD-10-CM | POA: Diagnosis not present

## 2023-05-15 DIAGNOSIS — R2681 Unsteadiness on feet: Secondary | ICD-10-CM | POA: Diagnosis not present

## 2023-05-15 DIAGNOSIS — S72142D Displaced intertrochanteric fracture of left femur, subsequent encounter for closed fracture with routine healing: Secondary | ICD-10-CM | POA: Diagnosis not present

## 2023-05-16 DIAGNOSIS — S72092D Other fracture of head and neck of left femur, subsequent encounter for closed fracture with routine healing: Secondary | ICD-10-CM | POA: Diagnosis not present

## 2023-05-16 DIAGNOSIS — E039 Hypothyroidism, unspecified: Secondary | ICD-10-CM | POA: Diagnosis not present

## 2023-05-16 DIAGNOSIS — F32A Depression, unspecified: Secondary | ICD-10-CM | POA: Diagnosis not present

## 2023-05-20 DIAGNOSIS — S62639A Displaced fracture of distal phalanx of unspecified finger, initial encounter for closed fracture: Secondary | ICD-10-CM | POA: Diagnosis not present

## 2023-05-20 DIAGNOSIS — S60012A Contusion of left thumb without damage to nail, initial encounter: Secondary | ICD-10-CM | POA: Diagnosis not present

## 2023-05-21 ENCOUNTER — Other Ambulatory Visit: Payer: Self-pay | Admitting: *Deleted

## 2023-05-21 NOTE — Patient Outreach (Signed)
Jeanne Tran resides in [facility]. Screening for potential care coordination services as benefit of health plan and Primary Care Provider.   Collaboration with [facility] psychotherapist. Jeanne Tran plans to return home alone. Jeanne Tran had private duty caregivers prior and her son checked on her frequently.  Will continue to follow for transition plans and care coordination needs.   Raiford Noble, MSN, RN,BSN Post Acute Care Coordinator [phone] (Direct dial)

## 2023-05-22 DIAGNOSIS — R41841 Cognitive communication deficit: Secondary | ICD-10-CM | POA: Diagnosis not present

## 2023-05-22 DIAGNOSIS — S72092D Other fracture of head and neck of left femur, subsequent encounter for closed fracture with routine healing: Secondary | ICD-10-CM | POA: Diagnosis not present

## 2023-05-22 DIAGNOSIS — R2681 Unsteadiness on feet: Secondary | ICD-10-CM | POA: Diagnosis not present

## 2023-05-22 DIAGNOSIS — Z9181 History of falling: Secondary | ICD-10-CM | POA: Diagnosis not present

## 2023-05-22 DIAGNOSIS — M6281 Muscle weakness (generalized): Secondary | ICD-10-CM | POA: Diagnosis not present

## 2023-05-26 DIAGNOSIS — E039 Hypothyroidism, unspecified: Secondary | ICD-10-CM | POA: Diagnosis not present

## 2023-05-26 DIAGNOSIS — Z9181 History of falling: Secondary | ICD-10-CM | POA: Diagnosis not present

## 2023-05-26 DIAGNOSIS — N184 Chronic kidney disease, stage 4 (severe): Secondary | ICD-10-CM | POA: Diagnosis not present

## 2023-05-26 DIAGNOSIS — R41841 Cognitive communication deficit: Secondary | ICD-10-CM | POA: Diagnosis not present

## 2023-05-26 DIAGNOSIS — R2681 Unsteadiness on feet: Secondary | ICD-10-CM | POA: Diagnosis not present

## 2023-05-26 DIAGNOSIS — M6281 Muscle weakness (generalized): Secondary | ICD-10-CM | POA: Diagnosis not present

## 2023-05-26 DIAGNOSIS — S72092D Other fracture of head and neck of left femur, subsequent encounter for closed fracture with routine healing: Secondary | ICD-10-CM | POA: Diagnosis not present

## 2023-05-29 DIAGNOSIS — R2681 Unsteadiness on feet: Secondary | ICD-10-CM | POA: Diagnosis not present

## 2023-05-29 DIAGNOSIS — S72092D Other fracture of head and neck of left femur, subsequent encounter for closed fracture with routine healing: Secondary | ICD-10-CM | POA: Diagnosis not present

## 2023-05-29 DIAGNOSIS — M6281 Muscle weakness (generalized): Secondary | ICD-10-CM | POA: Diagnosis not present

## 2023-05-29 DIAGNOSIS — R41841 Cognitive communication deficit: Secondary | ICD-10-CM | POA: Diagnosis not present

## 2023-05-29 DIAGNOSIS — Z9181 History of falling: Secondary | ICD-10-CM | POA: Diagnosis not present

## 2023-06-02 DIAGNOSIS — S72092D Other fracture of head and neck of left femur, subsequent encounter for closed fracture with routine healing: Secondary | ICD-10-CM | POA: Diagnosis not present

## 2023-06-02 DIAGNOSIS — R2681 Unsteadiness on feet: Secondary | ICD-10-CM | POA: Diagnosis not present

## 2023-06-02 DIAGNOSIS — Z9181 History of falling: Secondary | ICD-10-CM | POA: Diagnosis not present

## 2023-06-02 DIAGNOSIS — R41841 Cognitive communication deficit: Secondary | ICD-10-CM | POA: Diagnosis not present

## 2023-06-02 DIAGNOSIS — M6281 Muscle weakness (generalized): Secondary | ICD-10-CM | POA: Diagnosis not present

## 2023-06-05 DIAGNOSIS — Z9181 History of falling: Secondary | ICD-10-CM | POA: Diagnosis not present

## 2023-06-05 DIAGNOSIS — S72092D Other fracture of head and neck of left femur, subsequent encounter for closed fracture with routine healing: Secondary | ICD-10-CM | POA: Diagnosis not present

## 2023-06-05 DIAGNOSIS — R41841 Cognitive communication deficit: Secondary | ICD-10-CM | POA: Diagnosis not present

## 2023-06-05 DIAGNOSIS — R2681 Unsteadiness on feet: Secondary | ICD-10-CM | POA: Diagnosis not present

## 2023-06-05 DIAGNOSIS — M6281 Muscle weakness (generalized): Secondary | ICD-10-CM | POA: Diagnosis not present

## 2023-06-06 DIAGNOSIS — E039 Hypothyroidism, unspecified: Secondary | ICD-10-CM | POA: Diagnosis not present

## 2023-06-06 DIAGNOSIS — F32A Depression, unspecified: Secondary | ICD-10-CM | POA: Diagnosis not present

## 2023-06-06 DIAGNOSIS — K219 Gastro-esophageal reflux disease without esophagitis: Secondary | ICD-10-CM | POA: Diagnosis not present

## 2023-06-06 DIAGNOSIS — S72092D Other fracture of head and neck of left femur, subsequent encounter for closed fracture with routine healing: Secondary | ICD-10-CM | POA: Diagnosis not present

## 2023-06-09 DIAGNOSIS — R41841 Cognitive communication deficit: Secondary | ICD-10-CM | POA: Diagnosis not present

## 2023-06-09 DIAGNOSIS — S72092D Other fracture of head and neck of left femur, subsequent encounter for closed fracture with routine healing: Secondary | ICD-10-CM | POA: Diagnosis not present

## 2023-06-09 DIAGNOSIS — Z9181 History of falling: Secondary | ICD-10-CM | POA: Diagnosis not present

## 2023-06-09 DIAGNOSIS — M6281 Muscle weakness (generalized): Secondary | ICD-10-CM | POA: Diagnosis not present

## 2023-06-09 DIAGNOSIS — R2681 Unsteadiness on feet: Secondary | ICD-10-CM | POA: Diagnosis not present

## 2023-06-11 ENCOUNTER — Other Ambulatory Visit: Payer: Self-pay | Admitting: *Deleted

## 2023-06-11 NOTE — Patient Outreach (Signed)
Jeanne Tran resides in [facility] skilled [facility] facility. Screening for potential care coordination/ chronic care management services as a benefit of health plan and primary care provider.  Telephone call made to Jeanne Tran (son/DPR) [phone]. Patient identifiers confirmed. Jeanne Tran reports Jeanne Tran will return home soon. States he met with therapy today. States Jeanne Tran will return home. States Jeanne Tran has a lady that will stay with her 24/7 initially. States "we will play it by ear.". Jeanne Tran states Jeanne Tran has ortho appointment tomorrow.  Explained chronic care management services. Jeanne Tran (son) is agreeable. Reports he is primary contact at [phone].  Discussed Clinical research associate will plan to refer for care coordination team upon SNF discharge.   Will continue to follow.   Jeanne Noble, MSN, RN, BSN [place]  [facility], [facility] Commun[facility] RN Post- Acute Care Coordinator Direct Dial: [phone]

## 2023-06-12 DIAGNOSIS — M6281 Muscle weakness (generalized): Secondary | ICD-10-CM | POA: Diagnosis not present

## 2023-06-12 DIAGNOSIS — Z9181 History of falling: Secondary | ICD-10-CM | POA: Diagnosis not present

## 2023-06-12 DIAGNOSIS — S72092D Other fracture of head and neck of left femur, subsequent encounter for closed fracture with routine healing: Secondary | ICD-10-CM | POA: Diagnosis not present

## 2023-06-12 DIAGNOSIS — R2681 Unsteadiness on feet: Secondary | ICD-10-CM | POA: Diagnosis not present

## 2023-06-12 DIAGNOSIS — S72142D Displaced intertrochanteric fracture of left femur, subsequent encounter for closed fracture with routine healing: Secondary | ICD-10-CM | POA: Diagnosis not present

## 2023-06-12 DIAGNOSIS — R41841 Cognitive communication deficit: Secondary | ICD-10-CM | POA: Diagnosis not present

## 2023-06-16 DIAGNOSIS — M6281 Muscle weakness (generalized): Secondary | ICD-10-CM | POA: Diagnosis not present

## 2023-06-16 DIAGNOSIS — R41841 Cognitive communication deficit: Secondary | ICD-10-CM | POA: Diagnosis not present

## 2023-06-16 DIAGNOSIS — S72092D Other fracture of head and neck of left femur, subsequent encounter for closed fracture with routine healing: Secondary | ICD-10-CM | POA: Diagnosis not present

## 2023-06-16 DIAGNOSIS — R2681 Unsteadiness on feet: Secondary | ICD-10-CM | POA: Diagnosis not present

## 2023-06-16 DIAGNOSIS — Z9181 History of falling: Secondary | ICD-10-CM | POA: Diagnosis not present

## 2023-06-19 ENCOUNTER — Other Ambulatory Visit: Payer: Self-pay | Admitting: *Deleted

## 2023-06-19 DIAGNOSIS — M6281 Muscle weakness (generalized): Secondary | ICD-10-CM | POA: Diagnosis not present

## 2023-06-19 DIAGNOSIS — R41841 Cognitive communication deficit: Secondary | ICD-10-CM | POA: Diagnosis not present

## 2023-06-19 DIAGNOSIS — Z9181 History of falling: Secondary | ICD-10-CM | POA: Diagnosis not present

## 2023-06-19 DIAGNOSIS — R2681 Unsteadiness on feet: Secondary | ICD-10-CM | POA: Diagnosis not present

## 2023-06-19 DIAGNOSIS — S72092D Other fracture of head and neck of left femur, subsequent encounter for closed fracture with routine healing: Secondary | ICD-10-CM | POA: Diagnosis not present

## 2023-06-19 NOTE — Patient Outreach (Signed)
Late entry for 06/18/23. Post- Acute Care Coordinator follow up. Mrs. Hampe resides in [facility].  Screening for potential care coordination/ complex care management services as a benefit of health plan and primary care provider.  Collaboration visit with [facility] social worker. Transition date not set yet. Family training has been scheduled. Discussed Clinical research associate spoke with son Lyda Perone previously about CCM services.   Met with Mrs. Lippold at bedside. Mrs. Escareno confirms she has someone that stays with her from 11 am to 4 pm Monday thru Friday. Explained care coordination/complex care management services. Mrs. Batley remains agreeable as well. Confirms best contact number as [phone].  Discussed writer will continue to follow transition date and will plan to make referral for CCM team upon SNF discharge.   Raiford Noble, MSN, RN, BSN [place]  [facility], [facility] Commun[facility] RN Post- Acute Care Coordinator Direct Dial: [phone]

## 2023-06-23 DIAGNOSIS — Z9181 History of falling: Secondary | ICD-10-CM | POA: Diagnosis not present

## 2023-06-23 DIAGNOSIS — R41841 Cognitive communication deficit: Secondary | ICD-10-CM | POA: Diagnosis not present

## 2023-06-23 DIAGNOSIS — S72092D Other fracture of head and neck of left femur, subsequent encounter for closed fracture with routine healing: Secondary | ICD-10-CM | POA: Diagnosis not present

## 2023-06-23 DIAGNOSIS — M6281 Muscle weakness (generalized): Secondary | ICD-10-CM | POA: Diagnosis not present

## 2023-06-23 DIAGNOSIS — R2681 Unsteadiness on feet: Secondary | ICD-10-CM | POA: Diagnosis not present

## 2023-06-26 DIAGNOSIS — S72092D Other fracture of head and neck of left femur, subsequent encounter for closed fracture with routine healing: Secondary | ICD-10-CM | POA: Diagnosis not present

## 2023-06-26 DIAGNOSIS — M6281 Muscle weakness (generalized): Secondary | ICD-10-CM | POA: Diagnosis not present

## 2023-06-26 DIAGNOSIS — Z9181 History of falling: Secondary | ICD-10-CM | POA: Diagnosis not present

## 2023-06-26 DIAGNOSIS — R2681 Unsteadiness on feet: Secondary | ICD-10-CM | POA: Diagnosis not present

## 2023-06-26 DIAGNOSIS — R41841 Cognitive communication deficit: Secondary | ICD-10-CM | POA: Diagnosis not present

## 2023-06-27 DIAGNOSIS — W19XXXA Unspecified fall, initial encounter: Secondary | ICD-10-CM | POA: Diagnosis not present

## 2023-06-27 DIAGNOSIS — S72092D Other fracture of head and neck of left femur, subsequent encounter for closed fracture with routine healing: Secondary | ICD-10-CM | POA: Diagnosis not present

## 2023-06-30 DIAGNOSIS — R2681 Unsteadiness on feet: Secondary | ICD-10-CM | POA: Diagnosis not present

## 2023-06-30 DIAGNOSIS — N184 Chronic kidney disease, stage 4 (severe): Secondary | ICD-10-CM | POA: Diagnosis not present

## 2023-06-30 DIAGNOSIS — S72092D Other fracture of head and neck of left femur, subsequent encounter for closed fracture with routine healing: Secondary | ICD-10-CM | POA: Diagnosis not present

## 2023-06-30 DIAGNOSIS — Z9181 History of falling: Secondary | ICD-10-CM | POA: Diagnosis not present

## 2023-06-30 DIAGNOSIS — R41841 Cognitive communication deficit: Secondary | ICD-10-CM | POA: Diagnosis not present

## 2023-06-30 DIAGNOSIS — E039 Hypothyroidism, unspecified: Secondary | ICD-10-CM | POA: Diagnosis not present

## 2023-06-30 DIAGNOSIS — M6281 Muscle weakness (generalized): Secondary | ICD-10-CM | POA: Diagnosis not present

## 2023-06-30 DIAGNOSIS — F5101 Primary insomnia: Secondary | ICD-10-CM | POA: Diagnosis not present

## 2023-07-03 DIAGNOSIS — R41841 Cognitive communication deficit: Secondary | ICD-10-CM | POA: Diagnosis not present

## 2023-07-03 DIAGNOSIS — Z9181 History of falling: Secondary | ICD-10-CM | POA: Diagnosis not present

## 2023-07-03 DIAGNOSIS — M6281 Muscle weakness (generalized): Secondary | ICD-10-CM | POA: Diagnosis not present

## 2023-07-03 DIAGNOSIS — R2681 Unsteadiness on feet: Secondary | ICD-10-CM | POA: Diagnosis not present

## 2023-07-03 DIAGNOSIS — S72092D Other fracture of head and neck of left femur, subsequent encounter for closed fracture with routine healing: Secondary | ICD-10-CM | POA: Diagnosis not present

## 2023-07-07 DIAGNOSIS — R41841 Cognitive communication deficit: Secondary | ICD-10-CM | POA: Diagnosis not present

## 2023-07-07 DIAGNOSIS — M6281 Muscle weakness (generalized): Secondary | ICD-10-CM | POA: Diagnosis not present

## 2023-07-07 DIAGNOSIS — R2681 Unsteadiness on feet: Secondary | ICD-10-CM | POA: Diagnosis not present

## 2023-07-07 DIAGNOSIS — S72092D Other fracture of head and neck of left femur, subsequent encounter for closed fracture with routine healing: Secondary | ICD-10-CM | POA: Diagnosis not present

## 2023-07-07 DIAGNOSIS — Z9181 History of falling: Secondary | ICD-10-CM | POA: Diagnosis not present

## 2023-07-10 DIAGNOSIS — S72092D Other fracture of head and neck of left femur, subsequent encounter for closed fracture with routine healing: Secondary | ICD-10-CM | POA: Diagnosis not present

## 2023-07-10 DIAGNOSIS — Z9181 History of falling: Secondary | ICD-10-CM | POA: Diagnosis not present

## 2023-07-10 DIAGNOSIS — R2681 Unsteadiness on feet: Secondary | ICD-10-CM | POA: Diagnosis not present

## 2023-07-10 DIAGNOSIS — M6281 Muscle weakness (generalized): Secondary | ICD-10-CM | POA: Diagnosis not present

## 2023-07-10 DIAGNOSIS — R41841 Cognitive communication deficit: Secondary | ICD-10-CM | POA: Diagnosis not present

## 2023-07-13 DIAGNOSIS — S72092D Other fracture of head and neck of left femur, subsequent encounter for closed fracture with routine healing: Secondary | ICD-10-CM | POA: Diagnosis not present

## 2023-07-13 DIAGNOSIS — E039 Hypothyroidism, unspecified: Secondary | ICD-10-CM | POA: Diagnosis not present

## 2023-07-13 DIAGNOSIS — F5101 Primary insomnia: Secondary | ICD-10-CM | POA: Diagnosis not present

## 2023-07-13 DIAGNOSIS — N184 Chronic kidney disease, stage 4 (severe): Secondary | ICD-10-CM | POA: Diagnosis not present

## 2023-07-14 DIAGNOSIS — R2681 Unsteadiness on feet: Secondary | ICD-10-CM | POA: Diagnosis not present

## 2023-07-14 DIAGNOSIS — R41841 Cognitive communication deficit: Secondary | ICD-10-CM | POA: Diagnosis not present

## 2023-07-14 DIAGNOSIS — Z9181 History of falling: Secondary | ICD-10-CM | POA: Diagnosis not present

## 2023-07-14 DIAGNOSIS — S72092D Other fracture of head and neck of left femur, subsequent encounter for closed fracture with routine healing: Secondary | ICD-10-CM | POA: Diagnosis not present

## 2023-07-14 DIAGNOSIS — M6281 Muscle weakness (generalized): Secondary | ICD-10-CM | POA: Diagnosis not present

## 2023-07-17 ENCOUNTER — Other Ambulatory Visit: Payer: Self-pay | Admitting: *Deleted

## 2023-07-17 NOTE — Patient Outreach (Signed)
Late entry on 07/16/23 Mrs. Dieckman resides in [facility].  Screening for potential chronic care management services as a benefit of health plan and primary care provider.  Collaboration visit with [facility] social worker. Currently in appeal. Transition plan is to transition to [facility].   Will follow for discharge date.   Raiford Noble, MSN, RN, BSN [place]  [facility], Healthy Commun[facility] RN Post- Acute Care Coordinator Direct Dial: [phone]

## 2023-07-21 DIAGNOSIS — R2681 Unsteadiness on feet: Secondary | ICD-10-CM | POA: Diagnosis not present

## 2023-07-21 DIAGNOSIS — S72092D Other fracture of head and neck of left femur, subsequent encounter for closed fracture with routine healing: Secondary | ICD-10-CM | POA: Diagnosis not present

## 2023-07-21 DIAGNOSIS — M6281 Muscle weakness (generalized): Secondary | ICD-10-CM | POA: Diagnosis not present

## 2023-07-21 DIAGNOSIS — Z9181 History of falling: Secondary | ICD-10-CM | POA: Diagnosis not present

## 2023-07-21 DIAGNOSIS — R41841 Cognitive communication deficit: Secondary | ICD-10-CM | POA: Diagnosis not present

## 2023-07-24 DIAGNOSIS — Z9181 History of falling: Secondary | ICD-10-CM | POA: Diagnosis not present

## 2023-07-24 DIAGNOSIS — S72092D Other fracture of head and neck of left femur, subsequent encounter for closed fracture with routine healing: Secondary | ICD-10-CM | POA: Diagnosis not present

## 2023-07-24 DIAGNOSIS — R41841 Cognitive communication deficit: Secondary | ICD-10-CM | POA: Diagnosis not present

## 2023-07-24 DIAGNOSIS — R2681 Unsteadiness on feet: Secondary | ICD-10-CM | POA: Diagnosis not present

## 2023-07-24 DIAGNOSIS — M6281 Muscle weakness (generalized): Secondary | ICD-10-CM | POA: Diagnosis not present

## 2023-07-27 DIAGNOSIS — R2681 Unsteadiness on feet: Secondary | ICD-10-CM | POA: Diagnosis not present

## 2023-07-27 DIAGNOSIS — E039 Hypothyroidism, unspecified: Secondary | ICD-10-CM | POA: Diagnosis not present

## 2023-07-27 DIAGNOSIS — S72092D Other fracture of head and neck of left femur, subsequent encounter for closed fracture with routine healing: Secondary | ICD-10-CM | POA: Diagnosis not present

## 2023-07-27 DIAGNOSIS — F5101 Primary insomnia: Secondary | ICD-10-CM | POA: Diagnosis not present

## 2023-07-27 DIAGNOSIS — N184 Chronic kidney disease, stage 4 (severe): Secondary | ICD-10-CM | POA: Diagnosis not present

## 2023-07-27 DIAGNOSIS — M6281 Muscle weakness (generalized): Secondary | ICD-10-CM | POA: Diagnosis not present

## 2023-07-28 DIAGNOSIS — M6281 Muscle weakness (generalized): Secondary | ICD-10-CM | POA: Diagnosis not present

## 2023-07-28 DIAGNOSIS — E440 Moderate protein-calorie malnutrition: Secondary | ICD-10-CM | POA: Diagnosis not present

## 2023-07-28 DIAGNOSIS — N184 Chronic kidney disease, stage 4 (severe): Secondary | ICD-10-CM | POA: Diagnosis not present

## 2023-07-28 DIAGNOSIS — S72092D Other fracture of head and neck of left femur, subsequent encounter for closed fracture with routine healing: Secondary | ICD-10-CM | POA: Diagnosis not present

## 2023-07-29 DIAGNOSIS — F32A Depression, unspecified: Secondary | ICD-10-CM | POA: Diagnosis not present

## 2023-07-29 DIAGNOSIS — K5900 Constipation, unspecified: Secondary | ICD-10-CM | POA: Diagnosis not present

## 2023-07-29 DIAGNOSIS — E039 Hypothyroidism, unspecified: Secondary | ICD-10-CM | POA: Diagnosis not present

## 2023-07-29 DIAGNOSIS — E785 Hyperlipidemia, unspecified: Secondary | ICD-10-CM | POA: Diagnosis not present

## 2023-07-29 DIAGNOSIS — F419 Anxiety disorder, unspecified: Secondary | ICD-10-CM | POA: Diagnosis not present

## 2023-07-29 DIAGNOSIS — F5101 Primary insomnia: Secondary | ICD-10-CM | POA: Diagnosis not present

## 2023-07-29 DIAGNOSIS — Z87891 Personal history of nicotine dependence: Secondary | ICD-10-CM | POA: Diagnosis not present

## 2023-07-29 DIAGNOSIS — Z9181 History of falling: Secondary | ICD-10-CM | POA: Diagnosis not present

## 2023-07-29 DIAGNOSIS — Z853 Personal history of malignant neoplasm of breast: Secondary | ICD-10-CM | POA: Diagnosis not present

## 2023-07-29 DIAGNOSIS — Z993 Dependence on wheelchair: Secondary | ICD-10-CM | POA: Diagnosis not present

## 2023-07-29 DIAGNOSIS — G43909 Migraine, unspecified, not intractable, without status migrainosus: Secondary | ICD-10-CM | POA: Diagnosis not present

## 2023-07-29 DIAGNOSIS — Z556 Problems related to health literacy: Secondary | ICD-10-CM | POA: Diagnosis not present

## 2023-07-29 DIAGNOSIS — I129 Hypertensive chronic kidney disease with stage 1 through stage 4 chronic kidney disease, or unspecified chronic kidney disease: Secondary | ICD-10-CM | POA: Diagnosis not present

## 2023-07-29 DIAGNOSIS — S72142D Displaced intertrochanteric fracture of left femur, subsequent encounter for closed fracture with routine healing: Secondary | ICD-10-CM | POA: Diagnosis not present

## 2023-07-29 DIAGNOSIS — M6281 Muscle weakness (generalized): Secondary | ICD-10-CM | POA: Diagnosis not present

## 2023-07-29 DIAGNOSIS — G2581 Restless legs syndrome: Secondary | ICD-10-CM | POA: Diagnosis not present

## 2023-07-29 DIAGNOSIS — F4320 Adjustment disorder, unspecified: Secondary | ICD-10-CM | POA: Diagnosis not present

## 2023-07-29 DIAGNOSIS — N184 Chronic kidney disease, stage 4 (severe): Secondary | ICD-10-CM | POA: Diagnosis not present

## 2023-07-29 DIAGNOSIS — D62 Acute posthemorrhagic anemia: Secondary | ICD-10-CM | POA: Diagnosis not present

## 2023-07-29 DIAGNOSIS — Z860100 Personal history of colon polyps, unspecified: Secondary | ICD-10-CM | POA: Diagnosis not present

## 2023-07-30 ENCOUNTER — Telehealth: Payer: Self-pay | Admitting: Internal Medicine

## 2023-07-30 NOTE — Telephone Encounter (Signed)
Spoke to Jeanne Tran.  Inform him that Jeanne Tran has not seen with provider for a while and that Jeanne Tran would need an office visit.   Told him Jeanne Tran has hospital f/u on 08/06/2023 with provider if he can wait then address the form then.  Jeanne Tran states he cannot keep Jeanne Tran at home for another week and would like for Jeanne Tran to admit by today. He states his option is to admit Jeanne Tran to [facility] without the [facility] then they can transfer Jeanne Tran to [facility].   Jeanne Tran states he will go with the option of going to [facility] and then will have Jeanne Tran see provider to for hospital f/u visit.

## 2023-07-30 NOTE — Telephone Encounter (Signed)
Pt discharged from inpatient rehab, still has not regained mobility and son wants her to be readmitted to another rehab, [facility] and they need an FL2. [facility] will readmit without an FL2 form but patient's son does not want her to go there. Asking does patient need to come in for evaluation, son is requesting a call.

## 2023-07-31 DIAGNOSIS — S72142D Displaced intertrochanteric fracture of left femur, subsequent encounter for closed fracture with routine healing: Secondary | ICD-10-CM | POA: Diagnosis not present

## 2023-08-02 DIAGNOSIS — R2689 Other abnormalities of gait and mobility: Secondary | ICD-10-CM | POA: Diagnosis not present

## 2023-08-02 DIAGNOSIS — S72092D Other fracture of head and neck of left femur, subsequent encounter for closed fracture with routine healing: Secondary | ICD-10-CM | POA: Diagnosis not present

## 2023-08-02 DIAGNOSIS — M6281 Muscle weakness (generalized): Secondary | ICD-10-CM | POA: Diagnosis not present

## 2023-08-04 DIAGNOSIS — S72092D Other fracture of head and neck of left femur, subsequent encounter for closed fracture with routine healing: Secondary | ICD-10-CM | POA: Diagnosis not present

## 2023-08-04 DIAGNOSIS — R2689 Other abnormalities of gait and mobility: Secondary | ICD-10-CM | POA: Diagnosis not present

## 2023-08-04 DIAGNOSIS — M6281 Muscle weakness (generalized): Secondary | ICD-10-CM | POA: Diagnosis not present

## 2023-08-05 DIAGNOSIS — R2689 Other abnormalities of gait and mobility: Secondary | ICD-10-CM | POA: Diagnosis not present

## 2023-08-05 DIAGNOSIS — S72092D Other fracture of head and neck of left femur, subsequent encounter for closed fracture with routine healing: Secondary | ICD-10-CM | POA: Diagnosis not present

## 2023-08-05 DIAGNOSIS — E7800 Pure hypercholesterolemia, unspecified: Secondary | ICD-10-CM | POA: Diagnosis not present

## 2023-08-05 DIAGNOSIS — M6281 Muscle weakness (generalized): Secondary | ICD-10-CM | POA: Diagnosis not present

## 2023-08-06 ENCOUNTER — Inpatient Hospital Stay: Payer: Medicare Other | Admitting: Internal Medicine

## 2023-08-06 DIAGNOSIS — F321 Major depressive disorder, single episode, moderate: Secondary | ICD-10-CM | POA: Diagnosis not present

## 2023-08-06 DIAGNOSIS — F5101 Primary insomnia: Secondary | ICD-10-CM | POA: Diagnosis not present

## 2023-08-06 DIAGNOSIS — E039 Hypothyroidism, unspecified: Secondary | ICD-10-CM | POA: Diagnosis not present

## 2023-08-07 DIAGNOSIS — R278 Other lack of coordination: Secondary | ICD-10-CM | POA: Diagnosis not present

## 2023-08-07 DIAGNOSIS — R488 Other symbolic dysfunctions: Secondary | ICD-10-CM | POA: Diagnosis not present

## 2023-08-07 DIAGNOSIS — E039 Hypothyroidism, unspecified: Secondary | ICD-10-CM | POA: Diagnosis not present

## 2023-08-07 DIAGNOSIS — K219 Gastro-esophageal reflux disease without esophagitis: Secondary | ICD-10-CM | POA: Diagnosis not present

## 2023-08-07 DIAGNOSIS — R2689 Other abnormalities of gait and mobility: Secondary | ICD-10-CM | POA: Diagnosis not present

## 2023-08-07 DIAGNOSIS — Z9181 History of falling: Secondary | ICD-10-CM | POA: Diagnosis not present

## 2023-08-07 DIAGNOSIS — I1310 Hypertensive heart and chronic kidney disease without heart failure, with stage 1 through stage 4 chronic kidney disease, or unspecified chronic kidney disease: Secondary | ICD-10-CM | POA: Diagnosis not present

## 2023-08-07 DIAGNOSIS — R41841 Cognitive communication deficit: Secondary | ICD-10-CM | POA: Diagnosis not present

## 2023-08-07 DIAGNOSIS — M6281 Muscle weakness (generalized): Secondary | ICD-10-CM | POA: Diagnosis not present

## 2023-08-08 DIAGNOSIS — M6281 Muscle weakness (generalized): Secondary | ICD-10-CM | POA: Diagnosis not present

## 2023-08-08 DIAGNOSIS — R41841 Cognitive communication deficit: Secondary | ICD-10-CM | POA: Diagnosis not present

## 2023-08-08 DIAGNOSIS — R278 Other lack of coordination: Secondary | ICD-10-CM | POA: Diagnosis not present

## 2023-08-08 DIAGNOSIS — R488 Other symbolic dysfunctions: Secondary | ICD-10-CM | POA: Diagnosis not present

## 2023-08-08 DIAGNOSIS — Z9181 History of falling: Secondary | ICD-10-CM | POA: Diagnosis not present

## 2023-08-08 DIAGNOSIS — R2689 Other abnormalities of gait and mobility: Secondary | ICD-10-CM | POA: Diagnosis not present

## 2023-08-09 DIAGNOSIS — R41841 Cognitive communication deficit: Secondary | ICD-10-CM | POA: Diagnosis not present

## 2023-08-09 DIAGNOSIS — M6281 Muscle weakness (generalized): Secondary | ICD-10-CM | POA: Diagnosis not present

## 2023-08-09 DIAGNOSIS — Z9181 History of falling: Secondary | ICD-10-CM | POA: Diagnosis not present

## 2023-08-09 DIAGNOSIS — R2689 Other abnormalities of gait and mobility: Secondary | ICD-10-CM | POA: Diagnosis not present

## 2023-08-09 DIAGNOSIS — R488 Other symbolic dysfunctions: Secondary | ICD-10-CM | POA: Diagnosis not present

## 2023-08-09 DIAGNOSIS — R278 Other lack of coordination: Secondary | ICD-10-CM | POA: Diagnosis not present

## 2023-08-11 DIAGNOSIS — R488 Other symbolic dysfunctions: Secondary | ICD-10-CM | POA: Diagnosis not present

## 2023-08-11 DIAGNOSIS — Z9181 History of falling: Secondary | ICD-10-CM | POA: Diagnosis not present

## 2023-08-11 DIAGNOSIS — R278 Other lack of coordination: Secondary | ICD-10-CM | POA: Diagnosis not present

## 2023-08-11 DIAGNOSIS — E039 Hypothyroidism, unspecified: Secondary | ICD-10-CM | POA: Diagnosis not present

## 2023-08-11 DIAGNOSIS — K219 Gastro-esophageal reflux disease without esophagitis: Secondary | ICD-10-CM | POA: Diagnosis not present

## 2023-08-11 DIAGNOSIS — I1310 Hypertensive heart and chronic kidney disease without heart failure, with stage 1 through stage 4 chronic kidney disease, or unspecified chronic kidney disease: Secondary | ICD-10-CM | POA: Diagnosis not present

## 2023-08-11 DIAGNOSIS — M6281 Muscle weakness (generalized): Secondary | ICD-10-CM | POA: Diagnosis not present

## 2023-08-11 DIAGNOSIS — R2689 Other abnormalities of gait and mobility: Secondary | ICD-10-CM | POA: Diagnosis not present

## 2023-08-11 DIAGNOSIS — R41841 Cognitive communication deficit: Secondary | ICD-10-CM | POA: Diagnosis not present

## 2023-08-12 DIAGNOSIS — I1310 Hypertensive heart and chronic kidney disease without heart failure, with stage 1 through stage 4 chronic kidney disease, or unspecified chronic kidney disease: Secondary | ICD-10-CM | POA: Diagnosis not present

## 2023-08-12 DIAGNOSIS — R41841 Cognitive communication deficit: Secondary | ICD-10-CM | POA: Diagnosis not present

## 2023-08-12 DIAGNOSIS — R278 Other lack of coordination: Secondary | ICD-10-CM | POA: Diagnosis not present

## 2023-08-12 DIAGNOSIS — R488 Other symbolic dysfunctions: Secondary | ICD-10-CM | POA: Diagnosis not present

## 2023-08-12 DIAGNOSIS — R2689 Other abnormalities of gait and mobility: Secondary | ICD-10-CM | POA: Diagnosis not present

## 2023-08-12 DIAGNOSIS — K219 Gastro-esophageal reflux disease without esophagitis: Secondary | ICD-10-CM | POA: Diagnosis not present

## 2023-08-12 DIAGNOSIS — M6281 Muscle weakness (generalized): Secondary | ICD-10-CM | POA: Diagnosis not present

## 2023-08-12 DIAGNOSIS — E039 Hypothyroidism, unspecified: Secondary | ICD-10-CM | POA: Diagnosis not present

## 2023-08-12 DIAGNOSIS — Z9181 History of falling: Secondary | ICD-10-CM | POA: Diagnosis not present

## 2023-08-12 NOTE — Progress Notes (Unsigned)
No chief complaint on file.   HPI: Jeanne Tran 89 y.o. come in for Chronic disease management  ROS: See pertinent positives and negatives per HPI.  Past Medical History:  Diagnosis Date   Arthritis    Breast cancer (HCC)    hx of left with radiation and lumpectomy   Diverticulosis    GERD (gastroesophageal reflux disease)    History of colonic polyps    History of ERCP    and sphincterotomy for abnormal lfts and dilated biliary ducts 5/05   Hyperlipidemia    Migraine headache    Pulmonary nodule    RLS (restless legs syndrome)    Wears contact lenses     Family History  Problem Relation Age of Onset   Mitral valve prolapse Son        with afib to have surgery   Esophageal cancer Neg Hx    Liver disease Neg Hx    Stomach cancer Neg Hx     Social History   Socioeconomic History   Marital status: Widowed    Spouse name: Not on file   Number of children: 3   Years of education: Not on file   Highest education level: Not on file  Occupational History   Occupation: retired  Tobacco Use   Smoking status: Former    Current packs/day: 0.00    Average packs/day: 3.0 packs/day for 25.0 years (75.0 ttl pk-yrs)    Types: Cigarettes    Start date: 02/26/1969    Quit date: 02/26/1994    Years since quitting: 29.4   Smokeless tobacco: Never   Tobacco comments:    quit 24 years   Vaping Use   Vaping status: Never Used  Substance and Sexual Activity   Alcohol use: No   Drug use: No   Sexual activity: Not on file  Other Topics Concern   Not on file  Social History Narrative   Retired; widowed   2 sons   Plays bridge twice monthly   Still drives         Social Determinants of Health   Financial Resource Strain: Low Risk  (01/16/2022)   Overall Financial Resource Strain (CARDIA)    Difficulty of Paying Living Expenses: Not hard at all  Food Insecurity: No Food Insecurity (04/26/2023)   Hunger Vital Sign    Worried About Running Out of Food in the Last  Year: Never true    Ran Out of Food in the Last Year: Never true  Transportation Needs: No Transportation Needs (04/26/2023)   PRAPARE - Administrator, Civil Service (Medical): No    Lack of Transportation (Non-Medical): No  Physical Activity: Inactive (12/14/2021)   Exercise Vital Sign    Days of Exercise per Week: 0 days    Minutes of Exercise per Session: 0 min  Stress: No Stress Concern Present (12/14/2021)   [employer] of Occupational Health - Occupational Stress Questionnaire    Feeling of Stress : Not at all  Social Connections: Unknown (01/29/2022)   [facility]   Social Network    Social Network: Not on file  Recent Concern: Social Connections - Socially Isolated (12/14/2021)   Social Connection and Isolation Panel [NHANES]    Frequency of Communication with Friends and Family: More than three times a week    Frequency of Social Gatherings with Friends and Family: More than three times a week    Attends Religious Services: Never  Active Member of Clubs or Organizations: No    Attends Banker Meetings: Never    Marital Status: Widowed    Outpatient Medications Prior to Visit  Medication Sig Dispense Refill   CALCIUM PO Take 1 tablet by mouth daily.     enoxaparin (LOVENOX) 40 MG/0.4ML injection Inject 0.3 mLs (30 mg total) into the skin daily for 28 days. 8.4 mL 0   levothyroxine (SYNTHROID) 50 MCG tablet TAKE 1 TABLET(50 MCG) BY MOUTH DAILY 90 tablet 1   Melatonin-Pyridoxine (MELATIN PO) Take 1 tablet by mouth at bedtime as needed (sleep).     mirtazapine (REMERON) 15 MG tablet TAKE 1 TO 2 TABLETS BY MOUTH AT BEDTIME FOR SLEEP (Patient taking differently: Take 15 mg by mouth at bedtime. TAKE 1 TO 2 TABLETS BY MOUTH AT BEDTIME FOR SLEEP) 135 tablet 0   oxyCODONE (OXY IR/ROXICODONE) 5 MG immediate release tablet Take 0.5-1 tablets (2.5-5 mg total) by mouth every 4 (four) hours as needed for severe pain or moderate  pain. 20 tablet 0   polyethylene glycol (MIRALAX / GLYCOLAX) 17 g packet Take 17 g by mouth daily.     senna-docusate (SENOKOT-S) 8.6-50 MG tablet Take 1 tablet by mouth 2 (two) times daily.     sertraline (ZOLOFT) 100 MG tablet TAKE 1 TABLET(100 MG) BY MOUTH DAILY (Patient taking differently: Take 100 mg by mouth daily.) 90 tablet 1   vitamin B-12 (CYANOCOBALAMIN) 100 MCG tablet Take 100 mcg by mouth daily.     zolpidem (AMBIEN) 5 MG tablet Take 1 tablet (5 mg total) by mouth at bedtime as needed for up to 7 days for sleep. 7 tablet 0   No facility-administered medications prior to visit.     EXAM:  There were no vitals taken for this visit.  There is no height or weight on file to calculate BMI.  GENERAL: vitals reviewed and listed above, alert, oriented, appears well hydrated and in no acute distress HEENT: atraumatic, conjunctiva  clear, no obvious abnormalities on inspection of external nose and ears OP : no lesion edema or exudate  NECK: no obvious masses on inspection palpation  LUNGS: clear to auscultation bilaterally, no wheezes, rales or rhonchi, good air movement CV: HRRR, no clubbing cyanosis or  peripheral edema nl cap refill  MS: moves all extremities without noticeable focal  abnormality PSYCH: pleasant and cooperative, no obvious depression or anxiety Lab Results  Component Value Date   WBC 9.4 05/01/2023   HGB 8.0 (L) 05/01/2023   HCT 24.7 (L) 05/01/2023   PLT 246 05/01/2023   GLUCOSE 110 (H) 04/30/2023   CHOL 216 (H) 04/01/2022   TRIG 145.0 04/01/2022   HDL 63.00 04/01/2022   LDLCALC 124 (H) 04/01/2022   ALT 36 04/27/2023   AST 85 (H) 04/27/2023   NA 134 (L) 04/30/2023   K 3.8 04/30/2023   CL 101 04/30/2023   CREATININE 1.52 (H) 04/30/2023   BUN 33 (H) 04/30/2023   CO2 26 04/30/2023   TSH 1.21 06/14/2022   INR 1.0 04/25/2023   HGBA1C 6.2 (H) 04/26/2023   MICROALBUR <0.7 06/13/2021   BP Readings from Last 3 Encounters:  05/01/23 120/80  03/05/23  132/80  11/20/22 120/80    ASSESSMENT AND PLAN:  Discussed the following assessment and plan:  No diagnosis found.  -Patient advised to return or notify health care team  if  new concerns arise.  There are no Patient Instructions on file for this visit.   Neta Mends.  Lucca Greggs M.D.

## 2023-08-13 ENCOUNTER — Encounter: Payer: Self-pay | Admitting: Internal Medicine

## 2023-08-13 ENCOUNTER — Ambulatory Visit: Payer: Medicare Other | Admitting: Internal Medicine

## 2023-08-13 VITALS — BP 116/70 | HR 90 | Temp 97.6°F | Ht 64.0 in | Wt 133.0 lb

## 2023-08-13 DIAGNOSIS — Z741 Need for assistance with personal care: Secondary | ICD-10-CM

## 2023-08-13 DIAGNOSIS — G4709 Other insomnia: Secondary | ICD-10-CM | POA: Diagnosis not present

## 2023-08-13 DIAGNOSIS — R54 Age-related physical debility: Secondary | ICD-10-CM | POA: Diagnosis not present

## 2023-08-13 DIAGNOSIS — E039 Hypothyroidism, unspecified: Secondary | ICD-10-CM | POA: Diagnosis not present

## 2023-08-13 DIAGNOSIS — Z79899 Other long term (current) drug therapy: Secondary | ICD-10-CM | POA: Diagnosis not present

## 2023-08-13 DIAGNOSIS — R7989 Other specified abnormal findings of blood chemistry: Secondary | ICD-10-CM

## 2023-08-13 DIAGNOSIS — Z9181 History of falling: Secondary | ICD-10-CM | POA: Diagnosis not present

## 2023-08-13 DIAGNOSIS — D649 Anemia, unspecified: Secondary | ICD-10-CM | POA: Diagnosis not present

## 2023-08-13 DIAGNOSIS — E440 Moderate protein-calorie malnutrition: Secondary | ICD-10-CM | POA: Diagnosis not present

## 2023-08-13 DIAGNOSIS — S72002S Fracture of unspecified part of neck of left femur, sequela: Secondary | ICD-10-CM

## 2023-08-13 DIAGNOSIS — N184 Chronic kidney disease, stage 4 (severe): Secondary | ICD-10-CM

## 2023-08-13 DIAGNOSIS — R41841 Cognitive communication deficit: Secondary | ICD-10-CM | POA: Diagnosis not present

## 2023-08-13 DIAGNOSIS — I1310 Hypertensive heart and chronic kidney disease without heart failure, with stage 1 through stage 4 chronic kidney disease, or unspecified chronic kidney disease: Secondary | ICD-10-CM | POA: Diagnosis not present

## 2023-08-13 DIAGNOSIS — Z853 Personal history of malignant neoplasm of breast: Secondary | ICD-10-CM

## 2023-08-13 DIAGNOSIS — K219 Gastro-esophageal reflux disease without esophagitis: Secondary | ICD-10-CM | POA: Diagnosis not present

## 2023-08-13 DIAGNOSIS — Z09 Encounter for follow-up examination after completed treatment for conditions other than malignant neoplasm: Secondary | ICD-10-CM

## 2023-08-13 NOTE — Patient Instructions (Signed)
Lab update today . Plan fu .  Depending.

## 2023-08-14 DIAGNOSIS — M6281 Muscle weakness (generalized): Secondary | ICD-10-CM | POA: Diagnosis not present

## 2023-08-14 DIAGNOSIS — R488 Other symbolic dysfunctions: Secondary | ICD-10-CM | POA: Diagnosis not present

## 2023-08-14 DIAGNOSIS — Z9181 History of falling: Secondary | ICD-10-CM | POA: Diagnosis not present

## 2023-08-14 DIAGNOSIS — R278 Other lack of coordination: Secondary | ICD-10-CM | POA: Diagnosis not present

## 2023-08-14 DIAGNOSIS — R41841 Cognitive communication deficit: Secondary | ICD-10-CM | POA: Diagnosis not present

## 2023-08-14 DIAGNOSIS — R2689 Other abnormalities of gait and mobility: Secondary | ICD-10-CM | POA: Diagnosis not present

## 2023-08-15 LAB — CBC WITH DIFFERENTIAL/PLATELET
Absolute Lymphocytes: 1776 {cells}/uL (ref 850–3900)
Absolute Monocytes: 758 {cells}/uL (ref 200–950)
Basophils Absolute: 96 {cells}/uL (ref 0–200)
Basophils Relative: 1 %
Eosinophils Absolute: 298 {cells}/uL (ref 15–500)
Eosinophils Relative: 3.1 %
HCT: 40.7 % (ref 35.0–45.0)
Hemoglobin: 13.6 g/dL (ref 11.7–15.5)
MCH: 32.2 pg (ref 27.0–33.0)
MCHC: 33.4 g/dL (ref 32.0–36.0)
MCV: 96.4 fL (ref 80.0–100.0)
MPV: 10.9 fL (ref 7.5–12.5)
Monocytes Relative: 7.9 %
Neutro Abs: 6672 {cells}/uL (ref 1500–7800)
Neutrophils Relative %: 69.5 %
Platelets: 315 10*3/uL (ref 140–400)
RBC: 4.22 10*6/uL (ref 3.80–5.10)
RDW: 13.5 % (ref 11.0–15.0)
Total Lymphocyte: 18.5 %
WBC: 9.6 10*3/uL (ref 3.8–10.8)

## 2023-08-15 LAB — PREALBUMIN: Prealbumin: 28 mg/dL (ref 17–34)

## 2023-08-15 LAB — BASIC METABOLIC PANEL
BUN/Creatinine Ratio: 22 (calc) (ref 6–22)
BUN: 41 mg/dL — ABNORMAL HIGH (ref 7–25)
CO2: 27 mmol/L (ref 20–32)
Calcium: 10.4 mg/dL (ref 8.6–10.4)
Chloride: 102 mmol/L (ref 98–110)
Creat: 1.86 mg/dL — ABNORMAL HIGH (ref 0.60–0.95)
Glucose, Bld: 102 mg/dL — ABNORMAL HIGH (ref 65–99)
Potassium: 4.7 mmol/L (ref 3.5–5.3)
Sodium: 140 mmol/L (ref 135–146)

## 2023-08-15 LAB — TSH: TSH: 1.5 m[IU]/L (ref 0.40–4.50)

## 2023-08-17 DIAGNOSIS — R278 Other lack of coordination: Secondary | ICD-10-CM | POA: Diagnosis not present

## 2023-08-17 DIAGNOSIS — R2689 Other abnormalities of gait and mobility: Secondary | ICD-10-CM | POA: Diagnosis not present

## 2023-08-17 DIAGNOSIS — R41841 Cognitive communication deficit: Secondary | ICD-10-CM | POA: Diagnosis not present

## 2023-08-17 DIAGNOSIS — Z9181 History of falling: Secondary | ICD-10-CM | POA: Diagnosis not present

## 2023-08-17 DIAGNOSIS — M6281 Muscle weakness (generalized): Secondary | ICD-10-CM | POA: Diagnosis not present

## 2023-08-17 DIAGNOSIS — R488 Other symbolic dysfunctions: Secondary | ICD-10-CM | POA: Diagnosis not present

## 2023-08-18 DIAGNOSIS — R488 Other symbolic dysfunctions: Secondary | ICD-10-CM | POA: Diagnosis not present

## 2023-08-18 DIAGNOSIS — R41841 Cognitive communication deficit: Secondary | ICD-10-CM | POA: Diagnosis not present

## 2023-08-18 DIAGNOSIS — E039 Hypothyroidism, unspecified: Secondary | ICD-10-CM | POA: Diagnosis not present

## 2023-08-18 DIAGNOSIS — R278 Other lack of coordination: Secondary | ICD-10-CM | POA: Diagnosis not present

## 2023-08-18 DIAGNOSIS — K219 Gastro-esophageal reflux disease without esophagitis: Secondary | ICD-10-CM | POA: Diagnosis not present

## 2023-08-18 DIAGNOSIS — M6281 Muscle weakness (generalized): Secondary | ICD-10-CM | POA: Diagnosis not present

## 2023-08-18 DIAGNOSIS — Z9181 History of falling: Secondary | ICD-10-CM | POA: Diagnosis not present

## 2023-08-18 DIAGNOSIS — R2689 Other abnormalities of gait and mobility: Secondary | ICD-10-CM | POA: Diagnosis not present

## 2023-08-18 DIAGNOSIS — I1310 Hypertensive heart and chronic kidney disease without heart failure, with stage 1 through stage 4 chronic kidney disease, or unspecified chronic kidney disease: Secondary | ICD-10-CM | POA: Diagnosis not present

## 2023-08-19 DIAGNOSIS — R2689 Other abnormalities of gait and mobility: Secondary | ICD-10-CM | POA: Diagnosis not present

## 2023-08-19 DIAGNOSIS — R41841 Cognitive communication deficit: Secondary | ICD-10-CM | POA: Diagnosis not present

## 2023-08-19 DIAGNOSIS — R278 Other lack of coordination: Secondary | ICD-10-CM | POA: Diagnosis not present

## 2023-08-19 DIAGNOSIS — Z9181 History of falling: Secondary | ICD-10-CM | POA: Diagnosis not present

## 2023-08-19 DIAGNOSIS — M6281 Muscle weakness (generalized): Secondary | ICD-10-CM | POA: Diagnosis not present

## 2023-08-19 DIAGNOSIS — R488 Other symbolic dysfunctions: Secondary | ICD-10-CM | POA: Diagnosis not present

## 2023-08-19 NOTE — Progress Notes (Signed)
Anemia is resolved  blood sugar  thyroid all ok . Kidney function still decreased and should be followed    avoid any advil aleve type products that are toxic to kidneys . And stay hydrated  Plan  fu visit and lab in about 3 months or as needed so we can recheck kidney labs  and how doing

## 2023-08-20 DIAGNOSIS — E039 Hypothyroidism, unspecified: Secondary | ICD-10-CM | POA: Diagnosis not present

## 2023-08-20 DIAGNOSIS — I1310 Hypertensive heart and chronic kidney disease without heart failure, with stage 1 through stage 4 chronic kidney disease, or unspecified chronic kidney disease: Secondary | ICD-10-CM | POA: Diagnosis not present

## 2023-08-20 DIAGNOSIS — M6281 Muscle weakness (generalized): Secondary | ICD-10-CM | POA: Diagnosis not present

## 2023-08-20 DIAGNOSIS — K219 Gastro-esophageal reflux disease without esophagitis: Secondary | ICD-10-CM | POA: Diagnosis not present

## 2023-08-20 DIAGNOSIS — R488 Other symbolic dysfunctions: Secondary | ICD-10-CM | POA: Diagnosis not present

## 2023-08-20 DIAGNOSIS — Z9181 History of falling: Secondary | ICD-10-CM | POA: Diagnosis not present

## 2023-08-20 DIAGNOSIS — R278 Other lack of coordination: Secondary | ICD-10-CM | POA: Diagnosis not present

## 2023-08-20 DIAGNOSIS — R41841 Cognitive communication deficit: Secondary | ICD-10-CM | POA: Diagnosis not present

## 2023-08-20 DIAGNOSIS — R2689 Other abnormalities of gait and mobility: Secondary | ICD-10-CM | POA: Diagnosis not present

## 2023-08-21 DIAGNOSIS — R278 Other lack of coordination: Secondary | ICD-10-CM | POA: Diagnosis not present

## 2023-08-21 DIAGNOSIS — R488 Other symbolic dysfunctions: Secondary | ICD-10-CM | POA: Diagnosis not present

## 2023-08-21 DIAGNOSIS — M6281 Muscle weakness (generalized): Secondary | ICD-10-CM | POA: Diagnosis not present

## 2023-08-21 DIAGNOSIS — R2689 Other abnormalities of gait and mobility: Secondary | ICD-10-CM | POA: Diagnosis not present

## 2023-08-21 DIAGNOSIS — K219 Gastro-esophageal reflux disease without esophagitis: Secondary | ICD-10-CM | POA: Diagnosis not present

## 2023-08-21 DIAGNOSIS — R41841 Cognitive communication deficit: Secondary | ICD-10-CM | POA: Diagnosis not present

## 2023-08-21 DIAGNOSIS — Z9181 History of falling: Secondary | ICD-10-CM | POA: Diagnosis not present

## 2023-08-21 DIAGNOSIS — E039 Hypothyroidism, unspecified: Secondary | ICD-10-CM | POA: Diagnosis not present

## 2023-08-21 DIAGNOSIS — I1310 Hypertensive heart and chronic kidney disease without heart failure, with stage 1 through stage 4 chronic kidney disease, or unspecified chronic kidney disease: Secondary | ICD-10-CM | POA: Diagnosis not present

## 2023-08-22 DIAGNOSIS — R41841 Cognitive communication deficit: Secondary | ICD-10-CM | POA: Diagnosis not present

## 2023-08-22 DIAGNOSIS — M6281 Muscle weakness (generalized): Secondary | ICD-10-CM | POA: Diagnosis not present

## 2023-08-22 DIAGNOSIS — R488 Other symbolic dysfunctions: Secondary | ICD-10-CM | POA: Diagnosis not present

## 2023-08-22 DIAGNOSIS — R278 Other lack of coordination: Secondary | ICD-10-CM | POA: Diagnosis not present

## 2023-08-22 DIAGNOSIS — Z9181 History of falling: Secondary | ICD-10-CM | POA: Diagnosis not present

## 2023-08-22 DIAGNOSIS — R2689 Other abnormalities of gait and mobility: Secondary | ICD-10-CM | POA: Diagnosis not present

## 2023-08-24 DIAGNOSIS — R278 Other lack of coordination: Secondary | ICD-10-CM | POA: Diagnosis not present

## 2023-08-24 DIAGNOSIS — R488 Other symbolic dysfunctions: Secondary | ICD-10-CM | POA: Diagnosis not present

## 2023-08-24 DIAGNOSIS — R2689 Other abnormalities of gait and mobility: Secondary | ICD-10-CM | POA: Diagnosis not present

## 2023-08-24 DIAGNOSIS — M6281 Muscle weakness (generalized): Secondary | ICD-10-CM | POA: Diagnosis not present

## 2023-08-24 DIAGNOSIS — R41841 Cognitive communication deficit: Secondary | ICD-10-CM | POA: Diagnosis not present

## 2023-08-24 DIAGNOSIS — Z9181 History of falling: Secondary | ICD-10-CM | POA: Diagnosis not present

## 2023-08-25 DIAGNOSIS — Z9181 History of falling: Secondary | ICD-10-CM | POA: Diagnosis not present

## 2023-08-25 DIAGNOSIS — R41841 Cognitive communication deficit: Secondary | ICD-10-CM | POA: Diagnosis not present

## 2023-08-25 DIAGNOSIS — R2689 Other abnormalities of gait and mobility: Secondary | ICD-10-CM | POA: Diagnosis not present

## 2023-08-25 DIAGNOSIS — R278 Other lack of coordination: Secondary | ICD-10-CM | POA: Diagnosis not present

## 2023-08-25 DIAGNOSIS — M6281 Muscle weakness (generalized): Secondary | ICD-10-CM | POA: Diagnosis not present

## 2023-08-25 DIAGNOSIS — R488 Other symbolic dysfunctions: Secondary | ICD-10-CM | POA: Diagnosis not present

## 2023-08-26 DIAGNOSIS — R488 Other symbolic dysfunctions: Secondary | ICD-10-CM | POA: Diagnosis not present

## 2023-08-26 DIAGNOSIS — R2689 Other abnormalities of gait and mobility: Secondary | ICD-10-CM | POA: Diagnosis not present

## 2023-08-26 DIAGNOSIS — R278 Other lack of coordination: Secondary | ICD-10-CM | POA: Diagnosis not present

## 2023-08-26 DIAGNOSIS — R41841 Cognitive communication deficit: Secondary | ICD-10-CM | POA: Diagnosis not present

## 2023-08-26 DIAGNOSIS — M6281 Muscle weakness (generalized): Secondary | ICD-10-CM | POA: Diagnosis not present

## 2023-08-26 DIAGNOSIS — Z9181 History of falling: Secondary | ICD-10-CM | POA: Diagnosis not present

## 2023-08-27 DIAGNOSIS — R2689 Other abnormalities of gait and mobility: Secondary | ICD-10-CM | POA: Diagnosis not present

## 2023-08-27 DIAGNOSIS — R41841 Cognitive communication deficit: Secondary | ICD-10-CM | POA: Diagnosis not present

## 2023-08-27 DIAGNOSIS — M6281 Muscle weakness (generalized): Secondary | ICD-10-CM | POA: Diagnosis not present

## 2023-08-27 DIAGNOSIS — R278 Other lack of coordination: Secondary | ICD-10-CM | POA: Diagnosis not present

## 2023-08-27 DIAGNOSIS — R488 Other symbolic dysfunctions: Secondary | ICD-10-CM | POA: Diagnosis not present

## 2023-08-27 DIAGNOSIS — Z9181 History of falling: Secondary | ICD-10-CM | POA: Diagnosis not present

## 2023-08-28 DIAGNOSIS — N898 Other specified noninflammatory disorders of vagina: Secondary | ICD-10-CM | POA: Diagnosis not present

## 2023-08-28 DIAGNOSIS — D649 Anemia, unspecified: Secondary | ICD-10-CM | POA: Diagnosis not present

## 2023-08-28 DIAGNOSIS — M6281 Muscle weakness (generalized): Secondary | ICD-10-CM | POA: Diagnosis not present

## 2023-08-28 DIAGNOSIS — E039 Hypothyroidism, unspecified: Secondary | ICD-10-CM | POA: Diagnosis not present

## 2023-08-28 DIAGNOSIS — I1310 Hypertensive heart and chronic kidney disease without heart failure, with stage 1 through stage 4 chronic kidney disease, or unspecified chronic kidney disease: Secondary | ICD-10-CM | POA: Diagnosis not present

## 2023-08-28 DIAGNOSIS — R278 Other lack of coordination: Secondary | ICD-10-CM | POA: Diagnosis not present

## 2023-08-28 DIAGNOSIS — R41841 Cognitive communication deficit: Secondary | ICD-10-CM | POA: Diagnosis not present

## 2023-08-28 DIAGNOSIS — R488 Other symbolic dysfunctions: Secondary | ICD-10-CM | POA: Diagnosis not present

## 2023-08-28 DIAGNOSIS — K219 Gastro-esophageal reflux disease without esophagitis: Secondary | ICD-10-CM | POA: Diagnosis not present

## 2023-08-28 DIAGNOSIS — R2689 Other abnormalities of gait and mobility: Secondary | ICD-10-CM | POA: Diagnosis not present

## 2023-08-28 DIAGNOSIS — Z9181 History of falling: Secondary | ICD-10-CM | POA: Diagnosis not present

## 2023-08-29 DIAGNOSIS — R488 Other symbolic dysfunctions: Secondary | ICD-10-CM | POA: Diagnosis not present

## 2023-08-29 DIAGNOSIS — R278 Other lack of coordination: Secondary | ICD-10-CM | POA: Diagnosis not present

## 2023-08-29 DIAGNOSIS — Z9181 History of falling: Secondary | ICD-10-CM | POA: Diagnosis not present

## 2023-08-29 DIAGNOSIS — R41841 Cognitive communication deficit: Secondary | ICD-10-CM | POA: Diagnosis not present

## 2023-08-29 DIAGNOSIS — R2689 Other abnormalities of gait and mobility: Secondary | ICD-10-CM | POA: Diagnosis not present

## 2023-08-29 DIAGNOSIS — M6281 Muscle weakness (generalized): Secondary | ICD-10-CM | POA: Diagnosis not present

## 2023-08-30 DIAGNOSIS — R41841 Cognitive communication deficit: Secondary | ICD-10-CM | POA: Diagnosis not present

## 2023-08-30 DIAGNOSIS — M6281 Muscle weakness (generalized): Secondary | ICD-10-CM | POA: Diagnosis not present

## 2023-08-30 DIAGNOSIS — R2689 Other abnormalities of gait and mobility: Secondary | ICD-10-CM | POA: Diagnosis not present

## 2023-08-30 DIAGNOSIS — R278 Other lack of coordination: Secondary | ICD-10-CM | POA: Diagnosis not present

## 2023-08-30 DIAGNOSIS — Z9181 History of falling: Secondary | ICD-10-CM | POA: Diagnosis not present

## 2023-08-30 DIAGNOSIS — R488 Other symbolic dysfunctions: Secondary | ICD-10-CM | POA: Diagnosis not present

## 2023-09-02 DIAGNOSIS — R2689 Other abnormalities of gait and mobility: Secondary | ICD-10-CM | POA: Diagnosis not present

## 2023-09-02 DIAGNOSIS — M6281 Muscle weakness (generalized): Secondary | ICD-10-CM | POA: Diagnosis not present

## 2023-09-02 DIAGNOSIS — R278 Other lack of coordination: Secondary | ICD-10-CM | POA: Diagnosis not present

## 2023-09-02 DIAGNOSIS — R41841 Cognitive communication deficit: Secondary | ICD-10-CM | POA: Diagnosis not present

## 2023-09-02 DIAGNOSIS — R488 Other symbolic dysfunctions: Secondary | ICD-10-CM | POA: Diagnosis not present

## 2023-09-02 DIAGNOSIS — Z9181 History of falling: Secondary | ICD-10-CM | POA: Diagnosis not present

## 2023-09-03 DIAGNOSIS — R488 Other symbolic dysfunctions: Secondary | ICD-10-CM | POA: Diagnosis not present

## 2023-09-03 DIAGNOSIS — E039 Hypothyroidism, unspecified: Secondary | ICD-10-CM | POA: Diagnosis not present

## 2023-09-03 DIAGNOSIS — N898 Other specified noninflammatory disorders of vagina: Secondary | ICD-10-CM | POA: Diagnosis not present

## 2023-09-03 DIAGNOSIS — R41841 Cognitive communication deficit: Secondary | ICD-10-CM | POA: Diagnosis not present

## 2023-09-03 DIAGNOSIS — K219 Gastro-esophageal reflux disease without esophagitis: Secondary | ICD-10-CM | POA: Diagnosis not present

## 2023-09-03 DIAGNOSIS — R2689 Other abnormalities of gait and mobility: Secondary | ICD-10-CM | POA: Diagnosis not present

## 2023-09-03 DIAGNOSIS — R278 Other lack of coordination: Secondary | ICD-10-CM | POA: Diagnosis not present

## 2023-09-03 DIAGNOSIS — M6281 Muscle weakness (generalized): Secondary | ICD-10-CM | POA: Diagnosis not present

## 2023-09-03 DIAGNOSIS — I1310 Hypertensive heart and chronic kidney disease without heart failure, with stage 1 through stage 4 chronic kidney disease, or unspecified chronic kidney disease: Secondary | ICD-10-CM | POA: Diagnosis not present

## 2023-09-03 DIAGNOSIS — Z9181 History of falling: Secondary | ICD-10-CM | POA: Diagnosis not present

## 2023-09-04 DIAGNOSIS — R278 Other lack of coordination: Secondary | ICD-10-CM | POA: Diagnosis not present

## 2023-09-04 DIAGNOSIS — R488 Other symbolic dysfunctions: Secondary | ICD-10-CM | POA: Diagnosis not present

## 2023-09-04 DIAGNOSIS — R296 Repeated falls: Secondary | ICD-10-CM | POA: Diagnosis not present

## 2023-09-04 DIAGNOSIS — D649 Anemia, unspecified: Secondary | ICD-10-CM | POA: Diagnosis not present

## 2023-09-04 DIAGNOSIS — Z9181 History of falling: Secondary | ICD-10-CM | POA: Diagnosis not present

## 2023-09-04 DIAGNOSIS — M6281 Muscle weakness (generalized): Secondary | ICD-10-CM | POA: Diagnosis not present

## 2023-09-04 DIAGNOSIS — R41841 Cognitive communication deficit: Secondary | ICD-10-CM | POA: Diagnosis not present

## 2023-09-04 DIAGNOSIS — R2689 Other abnormalities of gait and mobility: Secondary | ICD-10-CM | POA: Diagnosis not present

## 2023-09-05 DIAGNOSIS — Z9181 History of falling: Secondary | ICD-10-CM | POA: Diagnosis not present

## 2023-09-05 DIAGNOSIS — R41841 Cognitive communication deficit: Secondary | ICD-10-CM | POA: Diagnosis not present

## 2023-09-05 DIAGNOSIS — R2689 Other abnormalities of gait and mobility: Secondary | ICD-10-CM | POA: Diagnosis not present

## 2023-09-05 DIAGNOSIS — M6281 Muscle weakness (generalized): Secondary | ICD-10-CM | POA: Diagnosis not present

## 2023-09-05 DIAGNOSIS — R488 Other symbolic dysfunctions: Secondary | ICD-10-CM | POA: Diagnosis not present

## 2023-09-05 DIAGNOSIS — R278 Other lack of coordination: Secondary | ICD-10-CM | POA: Diagnosis not present

## 2023-09-06 DIAGNOSIS — R278 Other lack of coordination: Secondary | ICD-10-CM | POA: Diagnosis not present

## 2023-09-06 DIAGNOSIS — M6281 Muscle weakness (generalized): Secondary | ICD-10-CM | POA: Diagnosis not present

## 2023-09-06 DIAGNOSIS — R2689 Other abnormalities of gait and mobility: Secondary | ICD-10-CM | POA: Diagnosis not present

## 2023-09-06 DIAGNOSIS — Z9181 History of falling: Secondary | ICD-10-CM | POA: Diagnosis not present

## 2023-09-06 DIAGNOSIS — R41841 Cognitive communication deficit: Secondary | ICD-10-CM | POA: Diagnosis not present

## 2023-09-06 DIAGNOSIS — R488 Other symbolic dysfunctions: Secondary | ICD-10-CM | POA: Diagnosis not present

## 2023-09-07 DIAGNOSIS — R41841 Cognitive communication deficit: Secondary | ICD-10-CM | POA: Diagnosis not present

## 2023-09-07 DIAGNOSIS — R278 Other lack of coordination: Secondary | ICD-10-CM | POA: Diagnosis not present

## 2023-09-07 DIAGNOSIS — Z9181 History of falling: Secondary | ICD-10-CM | POA: Diagnosis not present

## 2023-09-07 DIAGNOSIS — R2689 Other abnormalities of gait and mobility: Secondary | ICD-10-CM | POA: Diagnosis not present

## 2023-09-07 DIAGNOSIS — M6281 Muscle weakness (generalized): Secondary | ICD-10-CM | POA: Diagnosis not present

## 2023-09-07 DIAGNOSIS — R488 Other symbolic dysfunctions: Secondary | ICD-10-CM | POA: Diagnosis not present

## 2023-09-08 DIAGNOSIS — M6281 Muscle weakness (generalized): Secondary | ICD-10-CM | POA: Diagnosis not present

## 2023-09-08 DIAGNOSIS — R2689 Other abnormalities of gait and mobility: Secondary | ICD-10-CM | POA: Diagnosis not present

## 2023-09-08 DIAGNOSIS — Z9181 History of falling: Secondary | ICD-10-CM | POA: Diagnosis not present

## 2023-09-08 DIAGNOSIS — R41841 Cognitive communication deficit: Secondary | ICD-10-CM | POA: Diagnosis not present

## 2023-09-08 DIAGNOSIS — R278 Other lack of coordination: Secondary | ICD-10-CM | POA: Diagnosis not present

## 2023-09-08 DIAGNOSIS — R488 Other symbolic dysfunctions: Secondary | ICD-10-CM | POA: Diagnosis not present

## 2023-09-09 DIAGNOSIS — Z9181 History of falling: Secondary | ICD-10-CM | POA: Diagnosis not present

## 2023-09-09 DIAGNOSIS — R488 Other symbolic dysfunctions: Secondary | ICD-10-CM | POA: Diagnosis not present

## 2023-09-09 DIAGNOSIS — R41841 Cognitive communication deficit: Secondary | ICD-10-CM | POA: Diagnosis not present

## 2023-09-09 DIAGNOSIS — M6281 Muscle weakness (generalized): Secondary | ICD-10-CM | POA: Diagnosis not present

## 2023-09-09 DIAGNOSIS — R2689 Other abnormalities of gait and mobility: Secondary | ICD-10-CM | POA: Diagnosis not present

## 2023-09-09 DIAGNOSIS — R278 Other lack of coordination: Secondary | ICD-10-CM | POA: Diagnosis not present

## 2023-09-11 DIAGNOSIS — R41841 Cognitive communication deficit: Secondary | ICD-10-CM | POA: Diagnosis not present

## 2023-09-11 DIAGNOSIS — R488 Other symbolic dysfunctions: Secondary | ICD-10-CM | POA: Diagnosis not present

## 2023-09-11 DIAGNOSIS — R2689 Other abnormalities of gait and mobility: Secondary | ICD-10-CM | POA: Diagnosis not present

## 2023-09-11 DIAGNOSIS — M6281 Muscle weakness (generalized): Secondary | ICD-10-CM | POA: Diagnosis not present

## 2023-09-11 DIAGNOSIS — Z9181 History of falling: Secondary | ICD-10-CM | POA: Diagnosis not present

## 2023-09-11 DIAGNOSIS — R278 Other lack of coordination: Secondary | ICD-10-CM | POA: Diagnosis not present

## 2023-09-12 DIAGNOSIS — R488 Other symbolic dysfunctions: Secondary | ICD-10-CM | POA: Diagnosis not present

## 2023-09-12 DIAGNOSIS — R41841 Cognitive communication deficit: Secondary | ICD-10-CM | POA: Diagnosis not present

## 2023-09-12 DIAGNOSIS — Z9181 History of falling: Secondary | ICD-10-CM | POA: Diagnosis not present

## 2023-09-12 DIAGNOSIS — R278 Other lack of coordination: Secondary | ICD-10-CM | POA: Diagnosis not present

## 2023-09-12 DIAGNOSIS — R2689 Other abnormalities of gait and mobility: Secondary | ICD-10-CM | POA: Diagnosis not present

## 2023-09-12 DIAGNOSIS — M6281 Muscle weakness (generalized): Secondary | ICD-10-CM | POA: Diagnosis not present

## 2023-09-13 DIAGNOSIS — M6281 Muscle weakness (generalized): Secondary | ICD-10-CM | POA: Diagnosis not present

## 2023-09-13 DIAGNOSIS — R2689 Other abnormalities of gait and mobility: Secondary | ICD-10-CM | POA: Diagnosis not present

## 2023-09-13 DIAGNOSIS — R278 Other lack of coordination: Secondary | ICD-10-CM | POA: Diagnosis not present

## 2023-09-13 DIAGNOSIS — Z9181 History of falling: Secondary | ICD-10-CM | POA: Diagnosis not present

## 2023-09-13 DIAGNOSIS — R41841 Cognitive communication deficit: Secondary | ICD-10-CM | POA: Diagnosis not present

## 2023-09-13 DIAGNOSIS — R488 Other symbolic dysfunctions: Secondary | ICD-10-CM | POA: Diagnosis not present

## 2023-09-14 DIAGNOSIS — R41841 Cognitive communication deficit: Secondary | ICD-10-CM | POA: Diagnosis not present

## 2023-09-14 DIAGNOSIS — Z9181 History of falling: Secondary | ICD-10-CM | POA: Diagnosis not present

## 2023-09-14 DIAGNOSIS — R488 Other symbolic dysfunctions: Secondary | ICD-10-CM | POA: Diagnosis not present

## 2023-09-14 DIAGNOSIS — R278 Other lack of coordination: Secondary | ICD-10-CM | POA: Diagnosis not present

## 2023-09-14 DIAGNOSIS — M6281 Muscle weakness (generalized): Secondary | ICD-10-CM | POA: Diagnosis not present

## 2023-09-14 DIAGNOSIS — R2689 Other abnormalities of gait and mobility: Secondary | ICD-10-CM | POA: Diagnosis not present

## 2023-09-15 DIAGNOSIS — R41841 Cognitive communication deficit: Secondary | ICD-10-CM | POA: Diagnosis not present

## 2023-09-15 DIAGNOSIS — R2689 Other abnormalities of gait and mobility: Secondary | ICD-10-CM | POA: Diagnosis not present

## 2023-09-15 DIAGNOSIS — Z9181 History of falling: Secondary | ICD-10-CM | POA: Diagnosis not present

## 2023-09-15 DIAGNOSIS — R278 Other lack of coordination: Secondary | ICD-10-CM | POA: Diagnosis not present

## 2023-09-15 DIAGNOSIS — R488 Other symbolic dysfunctions: Secondary | ICD-10-CM | POA: Diagnosis not present

## 2023-09-15 DIAGNOSIS — M6281 Muscle weakness (generalized): Secondary | ICD-10-CM | POA: Diagnosis not present

## 2023-09-16 DIAGNOSIS — R296 Repeated falls: Secondary | ICD-10-CM | POA: Diagnosis not present

## 2023-09-16 DIAGNOSIS — Z9181 History of falling: Secondary | ICD-10-CM | POA: Diagnosis not present

## 2023-09-16 DIAGNOSIS — I1310 Hypertensive heart and chronic kidney disease without heart failure, with stage 1 through stage 4 chronic kidney disease, or unspecified chronic kidney disease: Secondary | ICD-10-CM | POA: Diagnosis not present

## 2023-09-16 DIAGNOSIS — R278 Other lack of coordination: Secondary | ICD-10-CM | POA: Diagnosis not present

## 2023-09-16 DIAGNOSIS — E039 Hypothyroidism, unspecified: Secondary | ICD-10-CM | POA: Diagnosis not present

## 2023-09-16 DIAGNOSIS — M6281 Muscle weakness (generalized): Secondary | ICD-10-CM | POA: Diagnosis not present

## 2023-09-16 DIAGNOSIS — K219 Gastro-esophageal reflux disease without esophagitis: Secondary | ICD-10-CM | POA: Diagnosis not present

## 2023-09-16 DIAGNOSIS — R488 Other symbolic dysfunctions: Secondary | ICD-10-CM | POA: Diagnosis not present

## 2023-09-16 DIAGNOSIS — R41841 Cognitive communication deficit: Secondary | ICD-10-CM | POA: Diagnosis not present

## 2023-09-16 DIAGNOSIS — R2689 Other abnormalities of gait and mobility: Secondary | ICD-10-CM | POA: Diagnosis not present

## 2023-09-18 DIAGNOSIS — R488 Other symbolic dysfunctions: Secondary | ICD-10-CM | POA: Diagnosis not present

## 2023-09-18 DIAGNOSIS — M6281 Muscle weakness (generalized): Secondary | ICD-10-CM | POA: Diagnosis not present

## 2023-09-18 DIAGNOSIS — R41841 Cognitive communication deficit: Secondary | ICD-10-CM | POA: Diagnosis not present

## 2023-09-18 DIAGNOSIS — R278 Other lack of coordination: Secondary | ICD-10-CM | POA: Diagnosis not present

## 2023-09-18 DIAGNOSIS — Z9181 History of falling: Secondary | ICD-10-CM | POA: Diagnosis not present

## 2023-09-18 DIAGNOSIS — R2689 Other abnormalities of gait and mobility: Secondary | ICD-10-CM | POA: Diagnosis not present

## 2023-09-19 DIAGNOSIS — R278 Other lack of coordination: Secondary | ICD-10-CM | POA: Diagnosis not present

## 2023-09-19 DIAGNOSIS — Z9181 History of falling: Secondary | ICD-10-CM | POA: Diagnosis not present

## 2023-09-19 DIAGNOSIS — R41841 Cognitive communication deficit: Secondary | ICD-10-CM | POA: Diagnosis not present

## 2023-09-19 DIAGNOSIS — M6281 Muscle weakness (generalized): Secondary | ICD-10-CM | POA: Diagnosis not present

## 2023-09-19 DIAGNOSIS — R488 Other symbolic dysfunctions: Secondary | ICD-10-CM | POA: Diagnosis not present

## 2023-09-19 DIAGNOSIS — R2689 Other abnormalities of gait and mobility: Secondary | ICD-10-CM | POA: Diagnosis not present

## 2023-09-21 DIAGNOSIS — Z9181 History of falling: Secondary | ICD-10-CM | POA: Diagnosis not present

## 2023-09-21 DIAGNOSIS — R41841 Cognitive communication deficit: Secondary | ICD-10-CM | POA: Diagnosis not present

## 2023-09-21 DIAGNOSIS — R488 Other symbolic dysfunctions: Secondary | ICD-10-CM | POA: Diagnosis not present

## 2023-09-21 DIAGNOSIS — R2689 Other abnormalities of gait and mobility: Secondary | ICD-10-CM | POA: Diagnosis not present

## 2023-09-21 DIAGNOSIS — M6281 Muscle weakness (generalized): Secondary | ICD-10-CM | POA: Diagnosis not present

## 2023-09-21 DIAGNOSIS — R278 Other lack of coordination: Secondary | ICD-10-CM | POA: Diagnosis not present

## 2023-09-22 DIAGNOSIS — M6281 Muscle weakness (generalized): Secondary | ICD-10-CM | POA: Diagnosis not present

## 2023-09-22 DIAGNOSIS — R278 Other lack of coordination: Secondary | ICD-10-CM | POA: Diagnosis not present

## 2023-09-22 DIAGNOSIS — R2689 Other abnormalities of gait and mobility: Secondary | ICD-10-CM | POA: Diagnosis not present

## 2023-09-22 DIAGNOSIS — R41841 Cognitive communication deficit: Secondary | ICD-10-CM | POA: Diagnosis not present

## 2023-09-22 DIAGNOSIS — Z9181 History of falling: Secondary | ICD-10-CM | POA: Diagnosis not present

## 2023-09-22 DIAGNOSIS — R488 Other symbolic dysfunctions: Secondary | ICD-10-CM | POA: Diagnosis not present

## 2023-09-23 DIAGNOSIS — R41841 Cognitive communication deficit: Secondary | ICD-10-CM | POA: Diagnosis not present

## 2023-09-23 DIAGNOSIS — R488 Other symbolic dysfunctions: Secondary | ICD-10-CM | POA: Diagnosis not present

## 2023-09-23 DIAGNOSIS — Z9181 History of falling: Secondary | ICD-10-CM | POA: Diagnosis not present

## 2023-09-23 DIAGNOSIS — R278 Other lack of coordination: Secondary | ICD-10-CM | POA: Diagnosis not present

## 2023-09-23 DIAGNOSIS — R2689 Other abnormalities of gait and mobility: Secondary | ICD-10-CM | POA: Diagnosis not present

## 2023-09-23 DIAGNOSIS — M6281 Muscle weakness (generalized): Secondary | ICD-10-CM | POA: Diagnosis not present

## 2023-09-25 DIAGNOSIS — Z9181 History of falling: Secondary | ICD-10-CM | POA: Diagnosis not present

## 2023-09-25 DIAGNOSIS — R41841 Cognitive communication deficit: Secondary | ICD-10-CM | POA: Diagnosis not present

## 2023-09-25 DIAGNOSIS — R278 Other lack of coordination: Secondary | ICD-10-CM | POA: Diagnosis not present

## 2023-09-25 DIAGNOSIS — R2689 Other abnormalities of gait and mobility: Secondary | ICD-10-CM | POA: Diagnosis not present

## 2023-09-25 DIAGNOSIS — M6281 Muscle weakness (generalized): Secondary | ICD-10-CM | POA: Diagnosis not present

## 2023-09-25 DIAGNOSIS — R488 Other symbolic dysfunctions: Secondary | ICD-10-CM | POA: Diagnosis not present

## 2023-09-26 DIAGNOSIS — M6281 Muscle weakness (generalized): Secondary | ICD-10-CM | POA: Diagnosis not present

## 2023-09-26 DIAGNOSIS — Z9181 History of falling: Secondary | ICD-10-CM | POA: Diagnosis not present

## 2023-09-26 DIAGNOSIS — R278 Other lack of coordination: Secondary | ICD-10-CM | POA: Diagnosis not present

## 2023-09-26 DIAGNOSIS — R488 Other symbolic dysfunctions: Secondary | ICD-10-CM | POA: Diagnosis not present

## 2023-09-26 DIAGNOSIS — R2689 Other abnormalities of gait and mobility: Secondary | ICD-10-CM | POA: Diagnosis not present

## 2023-09-26 DIAGNOSIS — R41841 Cognitive communication deficit: Secondary | ICD-10-CM | POA: Diagnosis not present

## 2023-09-27 DIAGNOSIS — Z9181 History of falling: Secondary | ICD-10-CM | POA: Diagnosis not present

## 2023-09-27 DIAGNOSIS — R41841 Cognitive communication deficit: Secondary | ICD-10-CM | POA: Diagnosis not present

## 2023-09-27 DIAGNOSIS — R278 Other lack of coordination: Secondary | ICD-10-CM | POA: Diagnosis not present

## 2023-09-27 DIAGNOSIS — M6281 Muscle weakness (generalized): Secondary | ICD-10-CM | POA: Diagnosis not present

## 2023-09-27 DIAGNOSIS — R2689 Other abnormalities of gait and mobility: Secondary | ICD-10-CM | POA: Diagnosis not present

## 2023-09-27 DIAGNOSIS — R488 Other symbolic dysfunctions: Secondary | ICD-10-CM | POA: Diagnosis not present

## 2023-09-29 DIAGNOSIS — Z9181 History of falling: Secondary | ICD-10-CM | POA: Diagnosis not present

## 2023-09-29 DIAGNOSIS — R278 Other lack of coordination: Secondary | ICD-10-CM | POA: Diagnosis not present

## 2023-09-29 DIAGNOSIS — R2689 Other abnormalities of gait and mobility: Secondary | ICD-10-CM | POA: Diagnosis not present

## 2023-09-29 DIAGNOSIS — R488 Other symbolic dysfunctions: Secondary | ICD-10-CM | POA: Diagnosis not present

## 2023-09-29 DIAGNOSIS — R41841 Cognitive communication deficit: Secondary | ICD-10-CM | POA: Diagnosis not present

## 2023-09-29 DIAGNOSIS — M6281 Muscle weakness (generalized): Secondary | ICD-10-CM | POA: Diagnosis not present

## 2023-09-30 DIAGNOSIS — R41841 Cognitive communication deficit: Secondary | ICD-10-CM | POA: Diagnosis not present

## 2023-09-30 DIAGNOSIS — R488 Other symbolic dysfunctions: Secondary | ICD-10-CM | POA: Diagnosis not present

## 2023-09-30 DIAGNOSIS — R2689 Other abnormalities of gait and mobility: Secondary | ICD-10-CM | POA: Diagnosis not present

## 2023-09-30 DIAGNOSIS — R278 Other lack of coordination: Secondary | ICD-10-CM | POA: Diagnosis not present

## 2023-09-30 DIAGNOSIS — Z9181 History of falling: Secondary | ICD-10-CM | POA: Diagnosis not present

## 2023-09-30 DIAGNOSIS — M6281 Muscle weakness (generalized): Secondary | ICD-10-CM | POA: Diagnosis not present

## 2023-10-01 DIAGNOSIS — Z9181 History of falling: Secondary | ICD-10-CM | POA: Diagnosis not present

## 2023-10-01 DIAGNOSIS — R41841 Cognitive communication deficit: Secondary | ICD-10-CM | POA: Diagnosis not present

## 2023-10-01 DIAGNOSIS — R488 Other symbolic dysfunctions: Secondary | ICD-10-CM | POA: Diagnosis not present

## 2023-10-01 DIAGNOSIS — R2689 Other abnormalities of gait and mobility: Secondary | ICD-10-CM | POA: Diagnosis not present

## 2023-10-01 DIAGNOSIS — M6281 Muscle weakness (generalized): Secondary | ICD-10-CM | POA: Diagnosis not present

## 2023-10-01 DIAGNOSIS — R278 Other lack of coordination: Secondary | ICD-10-CM | POA: Diagnosis not present

## 2023-10-01 DIAGNOSIS — E039 Hypothyroidism, unspecified: Secondary | ICD-10-CM | POA: Diagnosis not present

## 2023-10-01 DIAGNOSIS — I10 Essential (primary) hypertension: Secondary | ICD-10-CM | POA: Diagnosis not present

## 2023-10-01 DIAGNOSIS — I1310 Hypertensive heart and chronic kidney disease without heart failure, with stage 1 through stage 4 chronic kidney disease, or unspecified chronic kidney disease: Secondary | ICD-10-CM | POA: Diagnosis not present

## 2023-10-01 DIAGNOSIS — N184 Chronic kidney disease, stage 4 (severe): Secondary | ICD-10-CM | POA: Diagnosis not present

## 2023-10-01 DIAGNOSIS — K219 Gastro-esophageal reflux disease without esophagitis: Secondary | ICD-10-CM | POA: Diagnosis not present

## 2023-10-02 DIAGNOSIS — S72142D Displaced intertrochanteric fracture of left femur, subsequent encounter for closed fracture with routine healing: Secondary | ICD-10-CM | POA: Diagnosis not present

## 2023-10-02 DIAGNOSIS — R488 Other symbolic dysfunctions: Secondary | ICD-10-CM | POA: Diagnosis not present

## 2023-10-02 DIAGNOSIS — M6281 Muscle weakness (generalized): Secondary | ICD-10-CM | POA: Diagnosis not present

## 2023-10-02 DIAGNOSIS — R278 Other lack of coordination: Secondary | ICD-10-CM | POA: Diagnosis not present

## 2023-10-02 DIAGNOSIS — R2689 Other abnormalities of gait and mobility: Secondary | ICD-10-CM | POA: Diagnosis not present

## 2023-10-02 DIAGNOSIS — R41841 Cognitive communication deficit: Secondary | ICD-10-CM | POA: Diagnosis not present

## 2023-10-02 DIAGNOSIS — Z9181 History of falling: Secondary | ICD-10-CM | POA: Diagnosis not present

## 2023-10-06 DIAGNOSIS — R41841 Cognitive communication deficit: Secondary | ICD-10-CM | POA: Diagnosis not present

## 2023-10-06 DIAGNOSIS — R2689 Other abnormalities of gait and mobility: Secondary | ICD-10-CM | POA: Diagnosis not present

## 2023-10-06 DIAGNOSIS — Z9181 History of falling: Secondary | ICD-10-CM | POA: Diagnosis not present

## 2023-10-06 DIAGNOSIS — M6281 Muscle weakness (generalized): Secondary | ICD-10-CM | POA: Diagnosis not present

## 2023-10-06 DIAGNOSIS — R488 Other symbolic dysfunctions: Secondary | ICD-10-CM | POA: Diagnosis not present

## 2023-10-06 DIAGNOSIS — R278 Other lack of coordination: Secondary | ICD-10-CM | POA: Diagnosis not present

## 2023-10-07 DIAGNOSIS — R2689 Other abnormalities of gait and mobility: Secondary | ICD-10-CM | POA: Diagnosis not present

## 2023-10-07 DIAGNOSIS — M6281 Muscle weakness (generalized): Secondary | ICD-10-CM | POA: Diagnosis not present

## 2023-10-07 DIAGNOSIS — R41841 Cognitive communication deficit: Secondary | ICD-10-CM | POA: Diagnosis not present

## 2023-10-07 DIAGNOSIS — Z9181 History of falling: Secondary | ICD-10-CM | POA: Diagnosis not present

## 2023-10-07 DIAGNOSIS — R278 Other lack of coordination: Secondary | ICD-10-CM | POA: Diagnosis not present

## 2023-10-07 DIAGNOSIS — R488 Other symbolic dysfunctions: Secondary | ICD-10-CM | POA: Diagnosis not present

## 2023-10-08 DIAGNOSIS — R41841 Cognitive communication deficit: Secondary | ICD-10-CM | POA: Diagnosis not present

## 2023-10-08 DIAGNOSIS — M6281 Muscle weakness (generalized): Secondary | ICD-10-CM | POA: Diagnosis not present

## 2023-10-08 DIAGNOSIS — R278 Other lack of coordination: Secondary | ICD-10-CM | POA: Diagnosis not present

## 2023-10-08 DIAGNOSIS — R488 Other symbolic dysfunctions: Secondary | ICD-10-CM | POA: Diagnosis not present

## 2023-10-08 DIAGNOSIS — R2689 Other abnormalities of gait and mobility: Secondary | ICD-10-CM | POA: Diagnosis not present

## 2023-10-08 DIAGNOSIS — Z9181 History of falling: Secondary | ICD-10-CM | POA: Diagnosis not present

## 2023-10-09 DIAGNOSIS — Z9181 History of falling: Secondary | ICD-10-CM | POA: Diagnosis not present

## 2023-10-09 DIAGNOSIS — R41841 Cognitive communication deficit: Secondary | ICD-10-CM | POA: Diagnosis not present

## 2023-10-09 DIAGNOSIS — I1310 Hypertensive heart and chronic kidney disease without heart failure, with stage 1 through stage 4 chronic kidney disease, or unspecified chronic kidney disease: Secondary | ICD-10-CM | POA: Diagnosis not present

## 2023-10-09 DIAGNOSIS — R2689 Other abnormalities of gait and mobility: Secondary | ICD-10-CM | POA: Diagnosis not present

## 2023-10-09 DIAGNOSIS — K219 Gastro-esophageal reflux disease without esophagitis: Secondary | ICD-10-CM | POA: Diagnosis not present

## 2023-10-09 DIAGNOSIS — I10 Essential (primary) hypertension: Secondary | ICD-10-CM | POA: Diagnosis not present

## 2023-10-09 DIAGNOSIS — R278 Other lack of coordination: Secondary | ICD-10-CM | POA: Diagnosis not present

## 2023-10-09 DIAGNOSIS — M6281 Muscle weakness (generalized): Secondary | ICD-10-CM | POA: Diagnosis not present

## 2023-10-09 DIAGNOSIS — N184 Chronic kidney disease, stage 4 (severe): Secondary | ICD-10-CM | POA: Diagnosis not present

## 2023-10-09 DIAGNOSIS — R488 Other symbolic dysfunctions: Secondary | ICD-10-CM | POA: Diagnosis not present

## 2023-10-15 DIAGNOSIS — R0981 Nasal congestion: Secondary | ICD-10-CM | POA: Diagnosis not present

## 2023-10-15 DIAGNOSIS — U071 COVID-19: Secondary | ICD-10-CM | POA: Diagnosis not present

## 2023-10-16 DIAGNOSIS — R488 Other symbolic dysfunctions: Secondary | ICD-10-CM | POA: Diagnosis not present

## 2023-10-16 DIAGNOSIS — M6281 Muscle weakness (generalized): Secondary | ICD-10-CM | POA: Diagnosis not present

## 2023-10-16 DIAGNOSIS — R2689 Other abnormalities of gait and mobility: Secondary | ICD-10-CM | POA: Diagnosis not present

## 2023-10-16 DIAGNOSIS — R41841 Cognitive communication deficit: Secondary | ICD-10-CM | POA: Diagnosis not present

## 2023-10-16 DIAGNOSIS — Z9181 History of falling: Secondary | ICD-10-CM | POA: Diagnosis not present

## 2023-10-16 DIAGNOSIS — R278 Other lack of coordination: Secondary | ICD-10-CM | POA: Diagnosis not present

## 2023-10-28 ENCOUNTER — Emergency Department: Payer: Medicare Other

## 2023-10-28 ENCOUNTER — Inpatient Hospital Stay
Admission: EM | Admit: 2023-10-28 | Discharge: 2023-11-04 | DRG: 871 | Disposition: A | Discharge status: Skilled Nursing Facility | Payer: Medicare Other | Source: Skilled Nursing Facility | Attending: Internal Medicine | Admitting: Internal Medicine

## 2023-10-28 ENCOUNTER — Other Ambulatory Visit: Payer: Self-pay

## 2023-10-28 DIAGNOSIS — E039 Hypothyroidism, unspecified: Secondary | ICD-10-CM | POA: Diagnosis not present

## 2023-10-28 DIAGNOSIS — Z860100 Personal history of colon polyps, unspecified: Secondary | ICD-10-CM

## 2023-10-28 DIAGNOSIS — R6521 Severe sepsis with septic shock: Secondary | ICD-10-CM | POA: Diagnosis present

## 2023-10-28 DIAGNOSIS — A419 Sepsis, unspecified organism: Principal | ICD-10-CM | POA: Diagnosis present

## 2023-10-28 DIAGNOSIS — K7200 Acute and subacute hepatic failure without coma: Secondary | ICD-10-CM | POA: Diagnosis present

## 2023-10-28 DIAGNOSIS — N183 Chronic kidney disease, stage 3 unspecified: Secondary | ICD-10-CM

## 2023-10-28 DIAGNOSIS — Z79890 Hormone replacement therapy (postmenopausal): Secondary | ICD-10-CM

## 2023-10-28 DIAGNOSIS — E872 Acidosis, unspecified: Secondary | ICD-10-CM | POA: Diagnosis not present

## 2023-10-28 DIAGNOSIS — R000 Tachycardia, unspecified: Secondary | ICD-10-CM | POA: Diagnosis not present

## 2023-10-28 DIAGNOSIS — I700 Atherosclerosis of aorta: Secondary | ICD-10-CM | POA: Diagnosis not present

## 2023-10-28 DIAGNOSIS — A0472 Enterocolitis due to Clostridium difficile, not specified as recurrent: Secondary | ICD-10-CM

## 2023-10-28 DIAGNOSIS — J439 Emphysema, unspecified: Secondary | ICD-10-CM | POA: Diagnosis not present

## 2023-10-28 DIAGNOSIS — G2581 Restless legs syndrome: Secondary | ICD-10-CM | POA: Diagnosis present

## 2023-10-28 DIAGNOSIS — F32A Depression, unspecified: Secondary | ICD-10-CM | POA: Diagnosis present

## 2023-10-28 DIAGNOSIS — Z66 Do not resuscitate: Secondary | ICD-10-CM | POA: Diagnosis present

## 2023-10-28 DIAGNOSIS — I4510 Unspecified right bundle-branch block: Secondary | ICD-10-CM | POA: Diagnosis not present

## 2023-10-28 DIAGNOSIS — R55 Syncope and collapse: Secondary | ICD-10-CM | POA: Diagnosis not present

## 2023-10-28 DIAGNOSIS — I491 Atrial premature depolarization: Secondary | ICD-10-CM | POA: Diagnosis not present

## 2023-10-28 DIAGNOSIS — Z6828 Body mass index (BMI) 28.0-28.9, adult: Secondary | ICD-10-CM

## 2023-10-28 DIAGNOSIS — R0989 Other specified symptoms and signs involving the circulatory and respiratory systems: Secondary | ICD-10-CM | POA: Diagnosis not present

## 2023-10-28 DIAGNOSIS — R54 Age-related physical debility: Secondary | ICD-10-CM | POA: Diagnosis not present

## 2023-10-28 DIAGNOSIS — N1832 Chronic kidney disease, stage 3b: Secondary | ICD-10-CM | POA: Diagnosis present

## 2023-10-28 DIAGNOSIS — Z7401 Bed confinement status: Secondary | ICD-10-CM | POA: Diagnosis not present

## 2023-10-28 DIAGNOSIS — Z79899 Other long term (current) drug therapy: Secondary | ICD-10-CM

## 2023-10-28 DIAGNOSIS — D6859 Other primary thrombophilia: Secondary | ICD-10-CM | POA: Diagnosis not present

## 2023-10-28 DIAGNOSIS — K4090 Unilateral inguinal hernia, without obstruction or gangrene, not specified as recurrent: Secondary | ICD-10-CM | POA: Diagnosis not present

## 2023-10-28 DIAGNOSIS — Z515 Encounter for palliative care: Secondary | ICD-10-CM

## 2023-10-28 DIAGNOSIS — R64 Cachexia: Secondary | ICD-10-CM | POA: Diagnosis not present

## 2023-10-28 DIAGNOSIS — Z8249 Family history of ischemic heart disease and other diseases of the circulatory system: Secondary | ICD-10-CM

## 2023-10-28 DIAGNOSIS — R911 Solitary pulmonary nodule: Secondary | ICD-10-CM | POA: Diagnosis not present

## 2023-10-28 DIAGNOSIS — E0590 Thyrotoxicosis, unspecified without thyrotoxic crisis or storm: Secondary | ICD-10-CM | POA: Diagnosis present

## 2023-10-28 DIAGNOSIS — N179 Acute kidney failure, unspecified: Secondary | ICD-10-CM | POA: Diagnosis not present

## 2023-10-28 DIAGNOSIS — N390 Urinary tract infection, site not specified: Secondary | ICD-10-CM | POA: Diagnosis present

## 2023-10-28 DIAGNOSIS — R918 Other nonspecific abnormal finding of lung field: Secondary | ICD-10-CM | POA: Diagnosis not present

## 2023-10-28 DIAGNOSIS — R739 Hyperglycemia, unspecified: Secondary | ICD-10-CM | POA: Diagnosis not present

## 2023-10-28 DIAGNOSIS — E785 Hyperlipidemia, unspecified: Secondary | ICD-10-CM | POA: Diagnosis present

## 2023-10-28 DIAGNOSIS — U071 COVID-19: Secondary | ICD-10-CM | POA: Diagnosis present

## 2023-10-28 DIAGNOSIS — Z885 Allergy status to narcotic agent status: Secondary | ICD-10-CM

## 2023-10-28 DIAGNOSIS — K219 Gastro-esophageal reflux disease without esophagitis: Secondary | ICD-10-CM | POA: Diagnosis present

## 2023-10-28 DIAGNOSIS — E8721 Acute metabolic acidosis: Secondary | ICD-10-CM | POA: Diagnosis not present

## 2023-10-28 DIAGNOSIS — Z853 Personal history of malignant neoplasm of breast: Secondary | ICD-10-CM

## 2023-10-28 DIAGNOSIS — J984 Other disorders of lung: Secondary | ICD-10-CM | POA: Diagnosis not present

## 2023-10-28 DIAGNOSIS — N170 Acute kidney failure with tubular necrosis: Secondary | ICD-10-CM | POA: Diagnosis not present

## 2023-10-28 DIAGNOSIS — Z452 Encounter for adjustment and management of vascular access device: Secondary | ICD-10-CM | POA: Diagnosis not present

## 2023-10-28 DIAGNOSIS — K5730 Diverticulosis of large intestine without perforation or abscess without bleeding: Secondary | ICD-10-CM | POA: Diagnosis not present

## 2023-10-28 DIAGNOSIS — R531 Weakness: Secondary | ICD-10-CM | POA: Diagnosis not present

## 2023-10-28 DIAGNOSIS — A4159 Other Gram-negative sepsis: Secondary | ICD-10-CM | POA: Diagnosis present

## 2023-10-28 DIAGNOSIS — Z87891 Personal history of nicotine dependence: Secondary | ICD-10-CM

## 2023-10-28 DIAGNOSIS — Z886 Allergy status to analgesic agent status: Secondary | ICD-10-CM

## 2023-10-28 DIAGNOSIS — R7303 Prediabetes: Secondary | ICD-10-CM | POA: Diagnosis not present

## 2023-10-28 DIAGNOSIS — I469 Cardiac arrest, cause unspecified: Secondary | ICD-10-CM | POA: Diagnosis not present

## 2023-10-28 LAB — COMPREHENSIVE METABOLIC PANEL
ALT: 57 U/L — ABNORMAL HIGH (ref 0–44)
AST: 110 U/L — ABNORMAL HIGH (ref 15–41)
Albumin: 2.5 g/dL — ABNORMAL LOW (ref 3.5–5.0)
Alkaline Phosphatase: 100 U/L (ref 38–126)
Anion gap: 21 — ABNORMAL HIGH (ref 5–15)
BUN: 41 mg/dL — ABNORMAL HIGH (ref 8–23)
CO2: 13 mmol/L — ABNORMAL LOW (ref 22–32)
Calcium: 9.2 mg/dL (ref 8.9–10.3)
Chloride: 102 mmol/L (ref 98–111)
Creatinine, Ser: 1.68 mg/dL — ABNORMAL HIGH (ref 0.44–1.00)
GFR, Estimated: 29 mL/min — ABNORMAL LOW (ref >60)
Glucose, Bld: 391 mg/dL — ABNORMAL HIGH (ref 70–99)
Potassium: 3.4 mmol/L — ABNORMAL LOW (ref 3.5–5.1)
Sodium: 136 mmol/L (ref 135–145)
Total Bilirubin: 0.6 mg/dL (ref 0.0–1.2)
Total Protein: 5 g/dL — ABNORMAL LOW (ref 6.5–8.1)

## 2023-10-28 LAB — CBC WITH DIFFERENTIAL/PLATELET
Abs Immature Granulocytes: 0.88 10*3/uL — ABNORMAL HIGH (ref 0.00–0.07)
Basophils Absolute: 0.1 10*3/uL (ref 0.0–0.1)
Basophils Relative: 1 %
Eosinophils Absolute: 0.2 10*3/uL (ref 0.0–0.5)
Eosinophils Relative: 2 %
HCT: 35.4 % — ABNORMAL LOW (ref 36.0–46.0)
Hemoglobin: 10.7 g/dL — ABNORMAL LOW (ref 12.0–15.0)
Immature Granulocytes: 6 %
Lymphocytes Relative: 42 %
Lymphs Abs: 6.6 10*3/uL — ABNORMAL HIGH (ref 0.7–4.0)
MCH: 33.1 pg (ref 26.0–34.0)
MCHC: 30.2 g/dL (ref 30.0–36.0)
MCV: 109.6 fL — ABNORMAL HIGH (ref 80.0–100.0)
Monocytes Absolute: 1.2 10*3/uL — ABNORMAL HIGH (ref 0.1–1.0)
Monocytes Relative: 8 %
Neutro Abs: 6.3 10*3/uL (ref 1.7–7.7)
Neutrophils Relative %: 41 %
Platelets: 190 10*3/uL (ref 150–400)
RBC: 3.23 MIL/uL — ABNORMAL LOW (ref 3.87–5.11)
RDW: 14.1 % (ref 11.5–15.5)
Smear Review: NORMAL
WBC: 15.2 10*3/uL — ABNORMAL HIGH (ref 4.0–10.5)
nRBC: 0 % (ref 0.0–0.2)

## 2023-10-28 LAB — URINALYSIS, W/ REFLEX TO CULTURE (INFECTION SUSPECTED)
RBC / HPF: >50 RBC/hpf (ref 0–5)
Squamous Epithelial / HPF: 0 /[HPF] (ref 0–5)
WBC, UA: >50 WBC/hpf (ref 0–5)

## 2023-10-28 LAB — GLUCOSE, CAPILLARY: Glucose-Capillary: 284 mg/dL — ABNORMAL HIGH (ref 70–99)

## 2023-10-28 LAB — LACTIC ACID, PLASMA
Lactic Acid, Venous: >9 mmol/L (ref 0.5–1.9)
Lactic Acid, Venous: >9 mmol/L (ref 0.5–1.9)

## 2023-10-28 LAB — APTT: aPTT: 49 s — ABNORMAL HIGH (ref 24–36)

## 2023-10-28 LAB — PROTIME-INR
INR: 1.4 — ABNORMAL HIGH (ref 0.8–1.2)
Prothrombin Time: 17.1 s — ABNORMAL HIGH (ref 11.4–15.2)

## 2023-10-28 MED ORDER — POTASSIUM CHLORIDE 10 MEQ/100ML IV SOLN
10.0000 meq | INTRAVENOUS | Status: AC
  Administered 2023-10-29 (×3): 10 meq via INTRAVENOUS
  Filled 2023-10-28 (×3): qty 100

## 2023-10-28 MED ORDER — ACETAMINOPHEN 325 MG PO TABS: 650.0000 mg | ORAL_TABLET | ORAL | Status: DC | PRN

## 2023-10-28 MED ORDER — DOCUSATE SODIUM 100 MG PO CAPS: 100.0000 mg | ORAL_CAPSULE | Freq: Two times a day (BID) | ORAL | Status: DC | PRN

## 2023-10-28 MED ORDER — ONDANSETRON HCL 4 MG/2ML IJ SOLN
4.0000 mg | Freq: Four times a day (QID) | INTRAMUSCULAR | Status: DC | PRN
  Administered 2023-10-29 – 2023-10-31 (×5): 4 mg via INTRAVENOUS
  Filled 2023-10-28 (×5): qty 2

## 2023-10-28 MED ORDER — NOREPINEPHRINE 4 MG/250ML-% IV SOLN
2.0000 ug/min | INTRAVENOUS | Status: DC
Start: 2023-10-28 — End: 2023-10-29
  Administered 2023-10-28: 10 ug/min via INTRAVENOUS
  Administered 2023-10-29: 16 ug/min via INTRAVENOUS
  Administered 2023-10-29: 30 ug/min via INTRAVENOUS
  Filled 2023-10-28 (×2): qty 250

## 2023-10-28 MED ORDER — SODIUM CHLORIDE 0.9 % IV BOLUS (SEPSIS)
1000.0000 mL | Freq: Once | INTRAVENOUS | Status: AC
  Administered 2023-10-28: 1000 mL via INTRAVENOUS

## 2023-10-28 MED ORDER — NOREPINEPHRINE 4 MG/250ML-% IV SOLN
INTRAVENOUS | Status: AC
  Filled 2023-10-28: qty 250

## 2023-10-28 MED ORDER — LACTATED RINGERS IV SOLN: INTRAVENOUS | Status: DC

## 2023-10-28 MED ORDER — INSULIN ASPART 100 UNIT/ML IJ SOLN
0.0000 [IU] | INTRAMUSCULAR | Status: DC
  Administered 2023-10-29: 3 [IU] via SUBCUTANEOUS
  Administered 2023-10-30 (×2): 2 [IU] via SUBCUTANEOUS
  Administered 2023-10-30: 3 [IU] via SUBCUTANEOUS
  Administered 2023-10-30 – 2023-11-01 (×3): 2 [IU] via SUBCUTANEOUS
  Administered 2023-11-01: 3 [IU] via SUBCUTANEOUS
  Administered 2023-11-01: 2 [IU] via SUBCUTANEOUS
  Administered 2023-11-01: 3 [IU] via SUBCUTANEOUS
  Filled 2023-10-28 (×9): qty 1

## 2023-10-28 MED ORDER — HEPARIN SODIUM (PORCINE) 5000 UNIT/ML IJ SOLN: 5000.0000 [IU] | Freq: Three times a day (TID) | INTRAMUSCULAR | Status: DC

## 2023-10-28 MED ORDER — SODIUM CHLORIDE 0.9 % IV SOLN
250.0000 mL | INTRAVENOUS | Status: AC
  Administered 2023-10-29: 250 mL via INTRAVENOUS

## 2023-10-28 MED ORDER — VASOPRESSIN 20 UNITS/100 ML INFUSION FOR SHOCK
0.0000 [IU]/min | INTRAVENOUS | Status: DC
  Administered 2023-10-29: 0.03 [IU]/min via INTRAVENOUS
  Filled 2023-10-28 (×2): qty 100

## 2023-10-28 MED ORDER — SODIUM CHLORIDE 0.9 % IV SOLN
500.0000 mg | Freq: Once | INTRAVENOUS | Status: AC
  Administered 2023-10-28: 500 mg via INTRAVENOUS
  Filled 2023-10-28: qty 5

## 2023-10-28 MED ORDER — POLYETHYLENE GLYCOL 3350 17 G PO PACK: 17.0000 g | PACK | Freq: Every day | ORAL | Status: DC | PRN

## 2023-10-28 MED ORDER — SODIUM CHLORIDE 0.9 % IV SOLN
2.0000 g | Freq: Once | INTRAVENOUS | Status: AC
  Administered 2023-10-28: 2 g via INTRAVENOUS
  Filled 2023-10-28: qty 20

## 2023-10-28 NOTE — ED Notes (Signed)
Dr. Scotty Court informed of the patient's lactic acid >9 via face to face conversation. No new verbal orders received.

## 2023-10-28 NOTE — Progress Notes (Signed)
CODE SEPSIS - PHARMACY COMMUNICATION  **Broad Spectrum Antibiotics should be administered within 1 hour of Sepsis diagnosis**  Time Code Sepsis Called/Page Received: 2/11 @ 1937  Antibiotics Ordered:  Ceftriaxone 2g x 1 Zithromax 500 mg x 1  Time of 1st antibiotic administration: 2/11 @ 2000  Additional action taken by pharmacy: NA  If necessary, Name of Provider/Nurse Contacted: NA  Effie Shy, PharmD Pharmacy Resident  10/28/2023 7:38 PM

## 2023-10-28 NOTE — ED Provider Notes (Signed)
[facility] Provider Note    Event Date/Time   First MD Initiated Contact with Patient 10/28/23 1933     (approximate)   History   Chief Complaint: Loss of Consciousness and Hypotension   HPI  Jeanne Tran is a 89 y.o. female with a history of GERD, breast cancer who was sent to the ED due to syncope.  Patient was on the toilet, passed out.  EMS note the patient had altered mental status and moaning on their arrival and blood pressure was 60/40.  They gave 1500 mL IV fluid bolus prior to arrival in the ED.  Patient is moaning but shakes her head no when asked if she has pain.  Not able to participate in exam.  Does follow simple commands.          Physical Exam   Triage Vital Signs: ED Triage Vitals  Encounter Vitals Group     BP      Systolic BP Percentile      Diastolic BP Percentile      Pulse      Resp      Temp      Temp src      SpO2      Weight      Height      Head Circumference      Peak Flow      Pain Score      Pain Loc      Pain Education      Exclude from Growth Chart     Most recent vital signs: Vitals:   10/28/23 2245 10/28/23 2246  BP: (!) 85/63   Pulse:    Resp: (!) 25   Temp:    SpO2:  94%    General: Awake, ill-appearing CV:  Good peripheral perfusion.  Tachycardia heart rate 110, thready peripheral pulses Resp:  Normal effort.  Clear to auscultation bilaterally Abd:  No distention.  Soft nontender Other:  Dry oral mucosa   ED Results / Procedures / Treatments   Labs (all labs ordered are listed, but only abnormal results are displayed) Labs Reviewed  LACTIC ACID, PLASMA - Abnormal; Notable for the following components:      Result Value   Lactic Acid, Venous >9.0 (*)    All other components within normal limits  LACTIC ACID, PLASMA - Abnormal; Notable for the following components:   Lactic Acid, Venous >9.0 (*)    All other components within normal limits  COMPREHENSIVE METABOLIC PANEL -  Abnormal; Notable for the following components:   Potassium 3.4 (*)    CO2 13 (*)    Glucose, Bld 391 (*)    BUN 41 (*)    Creatinine, Ser 1.68 (*)    Total Protein 5.0 (*)    Albumin 2.5 (*)    AST 110 (*)    ALT 57 (*)    GFR, Estimated 29 (*)    Anion gap 21 (*)    All other components within normal limits  CBC WITH DIFFERENTIAL/PLATELET - Abnormal; Notable for the following components:   WBC 15.2 (*)    RBC 3.23 (*)    Hemoglobin 10.7 (*)    HCT 35.4 (*)    MCV 109.6 (*)    Lymphs Abs 6.6 (*)    Monocytes Absolute 1.2 (*)    Abs Immature Granulocytes 0.88 (*)    All other components within normal limits  PROTIME-INR - Abnormal; Notable for the following components:  Prothrombin Time 17.1 (*)    INR 1.4 (*)    All other components within normal limits  APTT - Abnormal; Notable for the following components:   aPTT 49 (*)    All other components within normal limits  URINALYSIS, W/ REFLEX TO CULTURE (INFECTION SUSPECTED) - Abnormal; Notable for the following components:   Color, Urine RED (*)    APPearance TURBID (*)    Glucose, UA   (*)    Value: TEST NOT REPORTED DUE TO COLOR INTERFERENCE OF URINE PIGMENT   Hgb urine dipstick   (*)    Value: TEST NOT REPORTED DUE TO COLOR INTERFERENCE OF URINE PIGMENT   Bilirubin Urine   (*)    Value: TEST NOT REPORTED DUE TO COLOR INTERFERENCE OF URINE PIGMENT   Ketones, ur   (*)    Value: TEST NOT REPORTED DUE TO COLOR INTERFERENCE OF URINE PIGMENT   Protein, ur   (*)    Value: TEST NOT REPORTED DUE TO COLOR INTERFERENCE OF URINE PIGMENT   Nitrite   (*)    Value: TEST NOT REPORTED DUE TO COLOR INTERFERENCE OF URINE PIGMENT   Leukocytes,Ua   (*)    Value: TEST NOT REPORTED DUE TO COLOR INTERFERENCE OF URINE PIGMENT   Bacteria, UA MANY (*)    All other components within normal limits  GLUCOSE, CAPILLARY - Abnormal; Notable for the following components:   Glucose-Capillary 284 (*)    All other components within normal limits   RESP PANEL BY RT-PCR (RSV, FLU A&B, COVID)  RVPGX2  CULTURE, BLOOD (ROUTINE X 2)  CULTURE, BLOOD (ROUTINE X 2)  URINE CULTURE  MRSA NEXT GEN BY PCR, NASAL  STOOL CULTURE  HEMOGLOBIN A1C  PROTIME-INR  BASIC METABOLIC PANEL  MAGNESIUM  PHOSPHORUS  CBC  LACTIC ACID, PLASMA  LACTIC ACID, PLASMA  LACTIC ACID, PLASMA     EKG Interpreted by me Atrial fibrillation, rate of 110.  Left axis, right bundle branch block.  No acute ischemic changes.   RADIOLOGY CT chest abdomen pelvis negative for free air or obstructing kidney stone.  Radiology report reviewed noting signs of cystitis   PROCEDURES:  .Critical Care  Performed by: Sharman Cheek, MD Authorized by: Sharman Cheek, MD   Critical care provider statement:    Critical care time (minutes):  35   Critical care time was exclusive of:  Separately billable procedures and treating other patients   Critical care was necessary to treat or prevent imminent or life-threatening deterioration of the following conditions:  Sepsis, shock, CNS failure or compromise and dehydration   Critical care was time spent personally by me on the following activities:  Development of treatment plan with patient or surrogate, discussions with consultants, evaluation of patient's response to treatment, examination of patient, obtaining history from patient or surrogate, ordering and performing treatments and interventions, ordering and review of laboratory studies, ordering and review of radiographic studies, pulse oximetry, re-evaluation of patient's condition and review of old charts   Care discussed with: admitting provider      MEDICATIONS ORDERED IN ED: Medications  0.9 %  sodium chloride infusion (has no administration in time range)  norepinephrine (LEVOPHED) 4mg  in (0.016 mg/mL) premix infusion (18 mcg/min Intravenous Infusion Verify 10/28/23 2210)  lactated ringers infusion ( Intravenous Infusion Verify 10/28/23 2210)  docusate  sodium (COLACE) capsule 100 mg (has no administration in time range)  polyethylene glycol (MIRALAX / GLYCOLAX) packet 17 g (has no administration in time range)  insulin  aspart (novoLOG) injection 0-15 Units (has no administration in time range)  heparin injection 5,000 Units (has no administration in time range)  acetaminophen (TYLENOL) tablet 650 mg (has no administration in time range)  ondansetron (ZOFRAN) injection 4 mg (has no administration in time range)  sodium chloride 0.9 % bolus 1,000 mL (0 mLs Intravenous Stopped 10/28/23 2001)    And  sodium chloride 0.9 % bolus 1,000 mL (0 mLs Intravenous Stopped 10/28/23 2028)  cefTRIAXone (ROCEPHIN) 2 g in sodium chloride 0.9 % 100 mL IVPB (0 g Intravenous Stopped 10/28/23 2031)  azithromycin (ZITHROMAX) 500 mg in sodium chloride 0.9 % 250 mL IVPB (0 mg Intravenous Stopped 10/28/23 2150)     IMPRESSION / MDM / ASSESSMENT AND PLAN / ED COURSE  I reviewed the triage vital signs and the nursing notes.  DDx: Septic shock, pneumonia, UTI, COVID, influenza.  No signs of soft tissue infection such as Fournier's.  Doubt meningitis or encephalitis  Patient's presentation is most consistent with acute presentation with potential threat to life or bodily function.  Patient arrives with hypotension tachycardia, concern for septic shock.  Empiric antibiotics and continued IV fluids ordered along with lab panel.   Clinical Course as of 10/28/23 2334  Tue Oct 28, 2023  2024 Sepsis reassessment completed.  Patient has received 1500 mL of IV fluid bolus from EMS and an additional 2000 mL IV fluid bolus in the ED.  Still persistently hypotensive with a blood pressure of about 65/50.  Patient is groaning.  Urine appears grossly purulent.  Will start Levophed, obtain CT chest abdomen pelvis primarily worried about obstructing kidney stone with pyelonephritis. [PS]  2058 MAP at goal on levophed 25 mcg/min. Lactate > 9. ICU team notified. [PS]    Clinical Course  User Index [PS] Sharman Cheek, MD     FINAL CLINICAL IMPRESSION(S) / ED DIAGNOSES   Final diagnoses:  Septic shock (HCC)     Rx / DC Orders   ED Discharge Orders     None        Note:  This document was prepared using Dragon voice recognition software and may include unintentional dictation errors.   Sharman Cheek, MD 10/28/23 [phone]

## 2023-10-28 NOTE — ED Notes (Signed)
Dr. Scotty Court informed of patient's temp 96.5 rectally and he gave verbal order to place multiple warm blankets on her. Multiple warm blankets applied.

## 2023-10-28 NOTE — ED Notes (Signed)
Levo is ordered 2-10 mcg/min. Levo currently at 28 mcg/min to reach a BP of 94/65 (MAP 75). Dr. Scotty Court aware.

## 2023-10-28 NOTE — Sepsis Progress Note (Addendum)
Elink monitoring for the code sepsis protocol.   2310: Notified provider of need to order 3rd lactic acid.  0001: Notified bedside nurse of need to draw 3rd lactic acid.

## 2023-10-28 NOTE — Consult Note (Signed)
PHARMACY CONSULT NOTE - ELECTROLYTES  Pharmacy Consult for Electrolyte Monitoring and Replacement   Recent Labs:   CrCl cannot be calculated (Unknown ideal weight.). Potassium  Date Value  10/28/2023 3.4 mmol/L (L)  04/17/2017 5.0 mEq/L   Calcium (mg/dL)  Date Value  16/06/9603 9.2  04/17/2017 10.1   Albumin (g/dL)  Date Value  54/05/8118 2.5 (L)  04/17/2017 4.0   Sodium  Date Value  10/28/2023 136 mmol/L  04/17/2017 141 mEq/L     Assessment  Jeanne Tran is a 89 y.o. female presenting with hypotension. PMH significant for GERD, breast cancer. Pharmacy has been consulted to monitor and replace electrolytes.  Diet: NPO MIVF: LR @ 250 mL/hr Pertinent medications: N/A  Goal of Therapy: Electrolytes WNL  Plan:  K 3.4: Kcl IV x 3 doses Check BMP, Mg, Phos with AM labs  Thank you for allowing pharmacy to be a part of this patient's care.  Bettey Costa, PharmD Clinical Pharmacist 10/28/2023 11:42 PM

## 2023-10-28 NOTE — Progress Notes (Signed)
eLink Physician-Brief Progress Note Patient Name: Jeanne Tran DOB: [birth year] MRN: [id number]   Date of Service  10/28/2023  HPI/Events of Note  73F with GERD, breast cancer who p/w AMS, syncope and hypotension. In ED BP 60/40. Given IVF and empiric abx. Urine noted to be purulent. Started on levophed. UA with many bacteria however remaining findings non-diagnostic. CT CAP with periportal edema, diffuse bladder wall thickening, inguinal hernia, hiatal hernia. WBC 15.2 LA >9. CO2 13. BUN/Cr 41/1.68, prior range Cr 1.4-1.86. Mild AST/ALT elevation.  [facility] PCCM consulted  Currently on levophed 18 mcg/min. Protecting airway  eICU Interventions  S/p ceftriaxone and azithro in the ED. Primary team to continue abx Wean levophed  LR @ 250 cc/hr F/u cultures     Intervention Category Evaluation Type: New Patient Evaluation  Jeanne Tran 10/28/2023, 11:37 PM

## 2023-10-28 NOTE — ED Triage Notes (Signed)
PER EMS: pt is from [facility] where staff reports she had a syncopal episode tonight when on the toilet. EMS reports she was alert upon their arrival but altered and only moaning out. Her initial BP was 60/41 automated and HR 120s. She received 1.5L of NS en route. EMS was unable to obtain pulse ox.  18g LAC by ems. Staff also reports she had covid 19 late January,  BP on arrival 75/33, HR-130.

## 2023-10-29 ENCOUNTER — Inpatient Hospital Stay: Payer: Medicare Other

## 2023-10-29 DIAGNOSIS — A0472 Enterocolitis due to Clostridium difficile, not specified as recurrent: Secondary | ICD-10-CM

## 2023-10-29 DIAGNOSIS — A419 Sepsis, unspecified organism: Secondary | ICD-10-CM | POA: Diagnosis not present

## 2023-10-29 DIAGNOSIS — E785 Hyperlipidemia, unspecified: Secondary | ICD-10-CM | POA: Diagnosis not present

## 2023-10-29 DIAGNOSIS — R6521 Severe sepsis with septic shock: Secondary | ICD-10-CM

## 2023-10-29 DIAGNOSIS — E8721 Acute metabolic acidosis: Secondary | ICD-10-CM | POA: Diagnosis not present

## 2023-10-29 DIAGNOSIS — R7303 Prediabetes: Secondary | ICD-10-CM | POA: Diagnosis not present

## 2023-10-29 DIAGNOSIS — N183 Chronic kidney disease, stage 3 unspecified: Secondary | ICD-10-CM | POA: Diagnosis not present

## 2023-10-29 LAB — CBC
HCT: 36.7 % (ref 36.0–46.0)
HCT: 36.9 % (ref 36.0–46.0)
HCT: 33.5 % — ABNORMAL LOW (ref 36.0–46.0)
Hemoglobin: 11.2 g/dL — ABNORMAL LOW (ref 12.0–15.0)
Hemoglobin: 11.6 g/dL — ABNORMAL LOW (ref 12.0–15.0)
Hemoglobin: 11.3 g/dL — ABNORMAL LOW (ref 12.0–15.0)
MCH: 33.2 pg (ref 26.0–34.0)
MCH: 33 pg (ref 26.0–34.0)
MCH: 33.1 pg (ref 26.0–34.0)
MCHC: 30.5 g/dL (ref 30.0–36.0)
MCHC: 31.4 g/dL (ref 30.0–36.0)
MCHC: 33.7 g/dL (ref 30.0–36.0)
MCV: 108.9 fL — ABNORMAL HIGH (ref 80.0–100.0)
MCV: 105.1 fL — ABNORMAL HIGH (ref 80.0–100.0)
MCV: 98.2 fL (ref 80.0–100.0)
Platelets: 133 10*3/uL — ABNORMAL LOW (ref 150–400)
Platelets: 154 10*3/uL (ref 150–400)
Platelets: 143 10*3/uL — ABNORMAL LOW (ref 150–400)
RBC: 3.37 MIL/uL — ABNORMAL LOW (ref 3.87–5.11)
RBC: 3.51 MIL/uL — ABNORMAL LOW (ref 3.87–5.11)
RBC: 3.41 MIL/uL — ABNORMAL LOW (ref 3.87–5.11)
RDW: 13.9 % (ref 11.5–15.5)
RDW: 14 % (ref 11.5–15.5)
RDW: 13.6 % (ref 11.5–15.5)
WBC: 31.5 10*3/uL — ABNORMAL HIGH (ref 4.0–10.5)
WBC: 34.8 10*3/uL — ABNORMAL HIGH (ref 4.0–10.5)
WBC: 36.5 10*3/uL — ABNORMAL HIGH (ref 4.0–10.5)
nRBC: 0.1 % (ref 0.0–0.2)
nRBC: 0.1 % (ref 0.0–0.2)
nRBC: 0.2 % (ref 0.0–0.2)

## 2023-10-29 LAB — GASTROINTESTINAL PANEL BY PCR, STOOL (REPLACES STOOL CULTURE)

## 2023-10-29 LAB — PROTIME-INR
INR: 2.3 — ABNORMAL HIGH (ref 0.8–1.2)
INR: 2.4 — ABNORMAL HIGH (ref 0.8–1.2)
Prothrombin Time: 25.8 s — ABNORMAL HIGH (ref 11.4–15.2)
Prothrombin Time: 26.6 s — ABNORMAL HIGH (ref 11.4–15.2)

## 2023-10-29 LAB — BLOOD GAS, ARTERIAL
Acid-base deficit: 17.5 mmol/L — ABNORMAL HIGH (ref 0.0–2.0)
Acid-base deficit: 13.9 mmol/L — ABNORMAL HIGH (ref 0.0–2.0)
Acid-base deficit: 8.5 mmol/L — ABNORMAL HIGH (ref 0.0–2.0)
Bicarbonate: 13.2 mmol/L — ABNORMAL LOW (ref 20.0–28.0)
Bicarbonate: 15 mmol/L — ABNORMAL LOW (ref 20.0–28.0)
Bicarbonate: 18.4 mmol/L — ABNORMAL LOW (ref 20.0–28.0)
FIO2: 100 %
O2 Content: 6 L/min
O2 Saturation: 100 %
O2 Saturation: 97.5 %
O2 Saturation: 98.8 %
Patient temperature: 37
Patient temperature: 37
Patient temperature: 37
pCO2 arterial: 50 mm[Hg] — ABNORMAL HIGH (ref 32–48)
pCO2 arterial: 45 mm[Hg] (ref 32–48)
pCO2 arterial: 42 mm[Hg] (ref 32–48)
pH, Arterial: 7.03 — CL (ref 7.35–7.45)
pH, Arterial: 7.13 — CL (ref 7.35–7.45)
pH, Arterial: 7.25 — ABNORMAL LOW (ref 7.35–7.45)
pO2, Arterial: 207 mm[Hg] — ABNORMAL HIGH (ref 83–108)
pO2, Arterial: 80 mm[Hg] — ABNORMAL LOW (ref 83–108)
pO2, Arterial: 82 mm[Hg] — ABNORMAL LOW (ref 83–108)

## 2023-10-29 LAB — GLUCOSE, CAPILLARY
Glucose-Capillary: 102 mg/dL — ABNORMAL HIGH (ref 70–99)
Glucose-Capillary: 118 mg/dL — ABNORMAL HIGH (ref 70–99)
Glucose-Capillary: 178 mg/dL — ABNORMAL HIGH (ref 70–99)
Glucose-Capillary: 200 mg/dL — ABNORMAL HIGH (ref 70–99)
Glucose-Capillary: 257 mg/dL — ABNORMAL HIGH (ref 70–99)
Glucose-Capillary: 44 mg/dL — CL (ref 70–99)
Glucose-Capillary: 82 mg/dL (ref 70–99)
Glucose-Capillary: 83 mg/dL (ref 70–99)

## 2023-10-29 LAB — COMPREHENSIVE METABOLIC PANEL
ALT: 908 U/L — ABNORMAL HIGH (ref 0–44)
AST: 1486 U/L — ABNORMAL HIGH (ref 15–41)
Albumin: 2.4 g/dL — ABNORMAL LOW (ref 3.5–5.0)
Alkaline Phosphatase: 190 U/L — ABNORMAL HIGH (ref 38–126)
Anion gap: 17 — ABNORMAL HIGH (ref 5–15)
BUN: 53 mg/dL — ABNORMAL HIGH (ref 8–23)
CO2: 17 mmol/L — ABNORMAL LOW (ref 22–32)
Calcium: 8.2 mg/dL — ABNORMAL LOW (ref 8.9–10.3)
Chloride: 101 mmol/L (ref 98–111)
Creatinine, Ser: 2.28 mg/dL — ABNORMAL HIGH (ref 0.44–1.00)
GFR, Estimated: 20 mL/min — ABNORMAL LOW (ref >60)
Glucose, Bld: 86 mg/dL (ref 70–99)
Potassium: 3.8 mmol/L (ref 3.5–5.1)
Sodium: 135 mmol/L (ref 135–145)
Total Bilirubin: 0.8 mg/dL (ref 0.0–1.2)
Total Protein: 4.6 g/dL — ABNORMAL LOW (ref 6.5–8.1)

## 2023-10-29 LAB — BASIC METABOLIC PANEL
Anion gap: 14 (ref 5–15)
Anion gap: 16 — ABNORMAL HIGH (ref 5–15)
BUN: 42 mg/dL — ABNORMAL HIGH (ref 8–23)
BUN: 47 mg/dL — ABNORMAL HIGH (ref 8–23)
CO2: 14 mmol/L — ABNORMAL LOW (ref 22–32)
CO2: 15 mmol/L — ABNORMAL LOW (ref 22–32)
Calcium: 7.9 mg/dL — ABNORMAL LOW (ref 8.9–10.3)
Calcium: 8 mg/dL — ABNORMAL LOW (ref 8.9–10.3)
Chloride: 109 mmol/L (ref 98–111)
Chloride: 105 mmol/L (ref 98–111)
Creatinine, Ser: 1.76 mg/dL — ABNORMAL HIGH (ref 0.44–1.00)
Creatinine, Ser: 1.99 mg/dL — ABNORMAL HIGH (ref 0.44–1.00)
GFR, Estimated: 27 mL/min — ABNORMAL LOW (ref >60)
GFR, Estimated: 24 mL/min — ABNORMAL LOW (ref >60)
Glucose, Bld: 257 mg/dL — ABNORMAL HIGH (ref 70–99)
Glucose, Bld: 163 mg/dL — ABNORMAL HIGH (ref 70–99)
Potassium: 3.7 mmol/L (ref 3.5–5.1)
Potassium: 4.3 mmol/L (ref 3.5–5.1)
Sodium: 137 mmol/L (ref 135–145)
Sodium: 136 mmol/L (ref 135–145)

## 2023-10-29 LAB — RESP PANEL BY RT-PCR (RSV, FLU A&B, COVID)  RVPGX2
Influenza A by PCR: NEGATIVE
Influenza B by PCR: NEGATIVE
Resp Syncytial Virus by PCR: NEGATIVE
SARS Coronavirus 2 by RT PCR: POSITIVE — AB

## 2023-10-29 LAB — MAGNESIUM
Magnesium: 1.9 mg/dL (ref 1.7–2.4)
Magnesium: 1.9 mg/dL (ref 1.7–2.4)

## 2023-10-29 LAB — LACTIC ACID, PLASMA
Lactic Acid, Venous: 5.1 mmol/L (ref 0.5–1.9)
Lactic Acid, Venous: 5.5 mmol/L (ref 0.5–1.9)
Lactic Acid, Venous: 4 mmol/L (ref 0.5–1.9)
Lactic Acid, Venous: 4.2 mmol/L (ref 0.5–1.9)
Lactic Acid, Venous: 3.4 mmol/L (ref 0.5–1.9)

## 2023-10-29 LAB — HEMOGLOBIN A1C
Hgb A1c MFr Bld: 5.6 % (ref 4.8–5.6)
Mean Plasma Glucose: 114.02 mg/dL

## 2023-10-29 LAB — PROCALCITONIN: Procalcitonin: 0.84 ng/mL

## 2023-10-29 LAB — C DIFFICILE QUICK SCREEN W PCR REFLEX
C Diff antigen: POSITIVE — AB
C Diff interpretation: DETECTED
C Diff toxin: POSITIVE — AB

## 2023-10-29 LAB — MRSA NEXT GEN BY PCR, NASAL: MRSA by PCR Next Gen: NOT DETECTED

## 2023-10-29 LAB — PHOSPHORUS
Phosphorus: 7.5 mg/dL — ABNORMAL HIGH (ref 2.5–4.6)
Phosphorus: 6.3 mg/dL — ABNORMAL HIGH (ref 2.5–4.6)

## 2023-10-29 MED ORDER — CALCIUM GLUCONATE-NACL 2-0.675 GM/100ML-% IV SOLN
2.0000 g | Freq: Once | INTRAVENOUS | Status: AC
  Administered 2023-10-29: 2000 mg via INTRAVENOUS
  Filled 2023-10-29: qty 100

## 2023-10-29 MED ORDER — VASOPRESSIN 20 UNITS/100 ML INFUSION FOR SHOCK
0.0000 [IU]/min | INTRAVENOUS | Status: DC
  Administered 2023-10-29 – 2023-10-30 (×2): 0.03 [IU]/min via INTRAVENOUS
  Filled 2023-10-29 (×2): qty 100

## 2023-10-29 MED ORDER — SODIUM CHLORIDE 0.9% FLUSH: 10.0000 mL | INTRAVENOUS | Status: DC | PRN

## 2023-10-29 MED ORDER — ADULT MULTIVITAMIN W/MINERALS CH
1.0000 | ORAL_TABLET | Freq: Every day | ORAL | Status: DC
  Administered 2023-10-30 – 2023-11-04 (×5): 1 via ORAL
  Filled 2023-10-29 (×5): qty 1

## 2023-10-29 MED ORDER — LACTATED RINGERS IV BOLUS
500.0000 mL | Freq: Once | INTRAVENOUS | Status: AC
  Administered 2023-10-29: 500 mL via INTRAVENOUS

## 2023-10-29 MED ORDER — LACTATED RINGERS IV BOLUS
1000.0000 mL | Freq: Once | INTRAVENOUS | Status: AC
  Administered 2023-10-29: 1000 mL via INTRAVENOUS

## 2023-10-29 MED ORDER — VANCOMYCIN HCL IN DEXTROSE 1-5 GM/200ML-% IV SOLN: 1000.0000 mg | INTRAVENOUS | Status: DC

## 2023-10-29 MED ORDER — VANCOMYCIN HCL 1250 MG/250ML IV SOLN
1250.0000 mg | INTRAVENOUS | Status: AC
  Administered 2023-10-29: 1250 mg via INTRAVENOUS
  Filled 2023-10-29: qty 250

## 2023-10-29 MED ORDER — SODIUM CHLORIDE 0.9 % IV SOLN
2.0000 g | INTRAVENOUS | Status: AC
  Administered 2023-10-29 – 2023-11-01 (×4): 2 g via INTRAVENOUS
  Filled 2023-10-29 (×4): qty 20

## 2023-10-29 MED ORDER — DEXAMETHASONE 6 MG PO TABS
6.0000 mg | ORAL_TABLET | Freq: Every day | ORAL | Status: DC
  Administered 2023-10-29 – 2023-11-02 (×5): 6 mg via ORAL
  Filled 2023-10-29 (×5): qty 1

## 2023-10-29 MED ORDER — VANCOMYCIN HCL 1250 MG/250ML IV SOLN
1250.0000 mg | INTRAVENOUS | Status: DC
  Filled 2023-10-29: qty 250

## 2023-10-29 MED ORDER — STERILE WATER FOR INJECTION IV SOLN
INTRAVENOUS | Status: DC
Start: 2023-10-29 — End: 2023-10-29
  Filled 2023-10-29: qty 1000
  Filled 2023-10-29: qty 150
  Filled 2023-10-29: qty 1000

## 2023-10-29 MED ORDER — VASOPRESSIN 20 UNITS/100 ML INFUSION FOR SHOCK
0.0000 [IU]/min | INTRAVENOUS | Status: DC
  Administered 2023-10-29: 0.03 [IU]/min via INTRAVENOUS
  Filled 2023-10-29: qty 100

## 2023-10-29 MED ORDER — PIPERACILLIN-TAZOBACTAM 3.375 G IVPB
3.3750 g | Freq: Two times a day (BID) | INTRAVENOUS | Status: DC
  Administered 2023-10-29: 3.375 g via INTRAVENOUS
  Filled 2023-10-29: qty 50

## 2023-10-29 MED ORDER — SODIUM BICARBONATE 8.4 % IV SOLN
50.0000 meq | Freq: Once | INTRAVENOUS | Status: AC
Start: 2023-10-29 — End: 2023-10-29
  Administered 2023-10-29: 50 meq via INTRAVENOUS
  Filled 2023-10-29: qty 50

## 2023-10-29 MED ORDER — VANCOMYCIN HCL 250 MG PO CAPS
500.0000 mg | ORAL_CAPSULE | Freq: Four times a day (QID) | ORAL | Status: DC
  Administered 2023-10-29 – 2023-11-01 (×14): 500 mg via ORAL
  Filled 2023-10-29: qty 4
  Filled 2023-10-29 (×7): qty 2
  Filled 2023-10-29: qty 4
  Filled 2023-10-29 (×3): qty 2
  Filled 2023-10-29 (×2): qty 4
  Filled 2023-10-29: qty 2
  Filled 2023-10-29: qty 4
  Filled 2023-10-29 (×2): qty 2

## 2023-10-29 MED ORDER — METRONIDAZOLE 500 MG/100ML IV SOLN
500.0000 mg | Freq: Three times a day (TID) | INTRAVENOUS | Status: DC
  Administered 2023-10-29 – 2023-11-02 (×13): 500 mg via INTRAVENOUS
  Filled 2023-10-29 (×15): qty 100

## 2023-10-29 MED ORDER — BOOST / RESOURCE BREEZE PO LIQD CUSTOM
1.0000 | Freq: Three times a day (TID) | ORAL | Status: DC
  Administered 2023-10-29 – 2023-11-04 (×14): 1 via ORAL

## 2023-10-29 MED ORDER — CHLORHEXIDINE GLUCONATE CLOTH 2 % EX PADS
6.0000 | MEDICATED_PAD | Freq: Every day | CUTANEOUS | Status: DC
  Administered 2023-10-29 – 2023-11-04 (×7): 6 via TOPICAL

## 2023-10-29 MED ORDER — SODIUM BICARBONATE 8.4 % IV SOLN
50.0000 meq | Freq: Once | INTRAVENOUS | Status: AC
  Administered 2023-10-29: 50 meq via INTRAVENOUS
  Filled 2023-10-29: qty 50

## 2023-10-29 MED ORDER — NOREPINEPHRINE 4 MG/250ML-% IV SOLN
0.0000 ug/min | INTRAVENOUS | Status: DC
  Administered 2023-10-29: 7 ug/min via INTRAVENOUS
  Administered 2023-10-29 – 2023-10-30 (×4): 13 ug/min via INTRAVENOUS
  Administered 2023-10-30: 7 ug/min via INTRAVENOUS
  Filled 2023-10-29 (×5): qty 250

## 2023-10-29 MED ORDER — SODIUM CHLORIDE 0.9% FLUSH
10.0000 mL | Freq: Two times a day (BID) | INTRAVENOUS | Status: DC
  Administered 2023-10-29: 20 mL
  Administered 2023-10-29 – 2023-10-31 (×4): 10 mL
  Administered 2023-10-31: 30 mL
  Administered 2023-11-01: 10 mL
  Administered 2023-11-01: 30 mL
  Administered 2023-11-02 – 2023-11-03 (×3): 10 mL
  Administered 2023-11-04: 40 mL

## 2023-10-29 NOTE — Consult Note (Signed)
Pharmacy Antibiotic Note  Jeanne Tran is a 89 y.o. female admitted on 10/28/2023 with  altered mental status , syncope and hypotension.  Pharmacy has been consulted for Vancomycin and Zosyn dosing.  Plan: Vancomycin 1250mg  IV x 1 given as loading dose, followed by: 1000 mg IV Q 48 hrs. Goal AUC 400-550. Expected AUC: 528 Expected Cmin: 13.3 SCr used: 1.76(near baseline), Vd used: 0.72  Zosyn 3.375g IV q12h (4 hour infusion, dose renally adjusted)   Will continue to monitor renal function and follow culture results  Weight: 65 kg (143 lb 4.8 oz)  Temp (24hrs), Avg:96.8 F (36 C), Min:96.5 F (35.8 C), Max:97 F (36.1 C)  Recent Labs  Lab 10/28/23 1938 10/28/23 2158 10/29/23 0133  WBC 15.2*  --  31.5*  CREATININE 1.68*  --  1.76*  LATICACIDVEN >9.0* >9.0* 5.1*    Estimated Creatinine Clearance: 19.1 mL/min (A) (by C-G formula based on SCr of 1.76 mg/dL (H)).    Allergies  Allergen Reactions   Codeine Other (See Comments)    REACTION: Intolerance w/ pain meds not cough syrup. Codeine makes her bounce off the walls. falling REACTION: Intolerance w/ pain meds not cough syrup. Codeine makes her bounce off the walls.   Aspirin Other (See Comments)    GI issues GI issues    Antimicrobials this admission: Vancomycin 2/12 >>  Zosyn 2/12 >>  Azithromycin/Ceftriaxone 2/11 x 1  Dose adjustments this admission: N/A  Microbiology results: 2/11 BCx: collected 2/11 UCx: collected  2/11 MRSA PCR: negative  Thank you for allowing pharmacy to be a part of this patient's care.  Bettey Costa 10/29/2023 3:57 AM

## 2023-10-29 NOTE — Progress Notes (Signed)
NAME:  Jeanne Tran, MRN:  [id number], DOB:  1934-05-22, LOS: 1 ADMISSION DATE:  10/28/2023 History of Present Illness:  Jeanne Tran is an 89 year old with a PMH significant for HLD, GERD, CKD stage 3, Hypothyroidism, Pulmonary nodule, RLS, Breast cancer, Hip fracture 04/2023 that presented to the ER 10/28/2023 with AMS. To review, according to son and chart review patient had a syncopal episode at her facility while on the toilet. EMS was called, BP 60/41, received 1.5 liters IVF. Upon arrival to the ER, Temp 96.5, 100% NRB mask, she was moaning, nodding no to pain, not following commands. Labs revealed WBC 15.2 LA >9. CO2 13. BUN/Cr 41/1.68, prior range Cr 1.4-1.86. Mild AST/ALT elevation, UA positive. Patient was started on Ceftriaxone and Azythromycin, cultures sent, and received an additional 2.5 liter IVF. CT abd/pelvis periportal edema, bladder wall thickening, inguinal hernia, hiatal hernia, aortic atherosclerosis, and Emphysema. She was then admitted to the ICU for further management.   Upon arrival she was mottled, lactic remained > 9, bicarb 13.2, glucose 284, INR 2.3, ABG Ph 7.03, CO2 50, Bicarb 13.2, PO2 207. Lines placed, Vasopressin added, and patient received and amp of bicarbonate and started on a bicarb infusion.  Patient was escalated to Vancomycin and Zosyn pending cultures. With escalation of pressors she was ultimately started on stress dose steroids. Additionally she has had numerous episodes of liquid diarrhea, now with rectal tube in place, stool cultures sent. According to son patient has not been febrile, nausea, vomiting or diarrhea. He states that since her hip fracture last August she has been declining and has been at a facility since. She is unable to ambulate, stand/pivot to commode only. He discussed with his siblings and they have made her a DNR, they believe her quality of life prior to this admission was likely unacceptable to her. Will continue with all aggressive  measures for now.    PCCM consulted for admission due to Septic Shock.  Pertinent  Medical History  HLD CKD stage 3 Hypothyroidsim Hip Fracture 04/2023  Covid infection 09/2023  Significant Hospital Events: Including procedures, antibiotic start and stop dates in addition to other pertinent events   2/11: Patient admitted with septic shock, likely due to UTI. Levophed/vasopressin. Bicarbonate drip/bolus's. Antibiotics, cultures, stress dose steroids.   Interim History / Subjective:  Patient seen and evaluated this AM. She is generally not feeling well.  Some abdominal tenderness.  Pressor requirements trending down.   Objective   Blood pressure (!) 77/52, pulse (!) 38, temperature (!) 97 F (36.1 C), temperature source Oral, resp. rate 18, weight 65 kg, SpO2 100%.        Intake/Output Summary (Last 24 hours) at 10/29/2023 1313 Last data filed at 10/29/2023 0554 Gross per 24 hour  Intake 3625.67 ml  Output 340 ml  Net 3285.67 ml   Filed Weights   10/29/23 0248  Weight: 65 kg    Examination: General: Elderly, no acute distress HENT: Supple neck reactive pupils Lungs: Clear bilateral air entry Cardiovascular: Normal S1, Normal S2, RRR Abdomen: Soft, mildly tender over the RLQ Extremities: Warm and well perfused, no edema.   Labs and imaging were reviewed.   Assessment & Plan:  This is a case of an 89 year old female with a history of hyperlipidemia, GERD, CKD stage III, hypothyroidism, breast cancer, hip fracture in August 2024 presenting to [facility] on 02/11 with shock.  Found to have a UTI.  Admitted for pressor support.  # Shock likely distributive/septic in  the setting of # Urosepsis with UA with extensive bacteria and turbid appearing urinalysis, leukocytosis # Lactic acidosis with downtrending lactate currently at 4.0 secondary to the above. # AKI with creatinine 2.2 mg/dL from a baseline of 1.5 mg/dL i likely prerenal azotemia in the setting of  sepsis. # Hepatitis with elevated AST at 1400 and elevated ALT at 900 likely in the setting of shock liver # Extensive diarrhea that started couple of days ago.  With lactic acidosis 1 can think of ischemic bowel however CT abdomen pelvis was on low suspicion.  Will consider surgery consult based on clinical progress. # COVID-19 positive on 120 5 repeat testing positive as well # Hypothyroidism  Neuro: Alert and oriented.  Delirium precautions. CVS: Levo for MAP greater than 65.  Continue with Decadron 6 mg daily (although low suspicion that COVID is active) Lungs: SpO2 goal greater than 90% ID: Continue Zosyn pending micro diagnosis.  Follow-up urine culture and blood cultures.  Sent for C. difficile as well to panel GI.  Okay for clear liquid diet. Renal: Monitor urine output. Heme: Decide tomorrow on chemical prophylaxis.  Best Practice (right click and "Reselect all SmartList Selections" daily)   Diet/type: clear liquids DVT prophylaxis SCD Pressure ulcer(s): N/A GI prophylaxis: N/A Lines: Central line Foley:  Yes, and it is still needed Code Status:  DNR  Last date of multidisciplinary goals of care discussion [10/29/2023]  I spent 60 minutes caring for this patient today, including preparing to see the patient, obtaining a medical history , reviewing a separately obtained history, performing a medically appropriate examination and/or evaluation, counseling and educating the patient/family/caregiver, documenting clinical information in the electronic health record, and independently interpreting results (not separately reported/billed) and communicating results to the patient/family/caregiver  Janann Colonel, MD [facility] 10/29/2023 1:51 PM

## 2023-10-29 NOTE — Progress Notes (Signed)
C. difficile antigen and stool toxin came back positive.  Started on p.o. vancomycin 500 every 6 hours as well as IV metronidazole treating as fulminant C. difficile.  Surgery was consulted and at bedside discussed risk and benefit of surgical intervention.  Reviewed the CT abdomen pelvis with the surgeon and appears to be underwhelming for fulminant C. difficile.  Patient was given another liter of LR given soft blood pressure.  Janann Colonel, MD [facility] 10/29/2023 2:45 PM

## 2023-10-29 NOTE — H&P (Signed)
NAME:  Jeanne Tran, MRN:  [id number], DOB:  10/02/1933, LOS: 1 ADMISSION DATE:  10/28/2023, CONSULTATION DATE:  10/28/2023 REFERRING MD:, CHIEF COMPLAINT:  Septic Shock   History of Present Illness:  Jeanne Tran is an 89 year old with a PMH significant for HLD, GERD, CKD stage 3, Hypothyroidism, Pulmonary nodule, RLS, Breast cancer, Hip fracture 04/2023 that presented to the ER 10/28/2023 with AMS. To review, according to son and chart review patient had a syncopal episode at her facility while on the toilet. EMS was called, BP 60/41, received 1.5 liters IVF. Upon arrival to the ER, Temp 96.5, 100% NRB mask, she was moaning, nodding no to pain, not following commands. Labs revealed WBC 15.2 LA >9. CO2 13. BUN/Cr 41/1.68, prior range Cr 1.4-1.86. Mild AST/ALT elevation, UA positive. Patient was started on Ceftriaxone and Azythromycin, cultures sent, and received an additional 2.5 liter IVF. CT abd/pelvis periportal edema, bladder wall thickening, inguinal hernia, hiatal hernia, aortic atherosclerosis, and Emphysema. She was then admitted to the ICU for further management.  Upon arrival she was mottled, lactic remained > 9, bicarb 13.2, glucose 284, INR 2.3, ABG Ph 7.03, CO2 50, Bicarb 13.2, PO2 207. Lines placed, Vasopressin added, and patient received and amp of bicarbonate and started on a bicarb infusion.  Patient was escalated to Vancomycin and Zosyn pending cultures. With escalation of pressors she was ultimately started on stress dose steroids. Additionally she has had numerous episodes of liquid diarrhea, now with rectal tube in place, stool cultures sent. According to son patient has not been febrile, nausea, vomiting or diarrhea. He states that since her hip fracture last August she has been declining and has been at a facility since. She is unable to ambulate, stand/pivot to commode only. He discussed with his siblings and they have made her a DNR, they believe her quality of life prior to  this admission was likely unacceptable to her. Will continue with all aggressive measures for now.   PCCM consulted for admission due to Septic Shock.  Pertinent  Medical History  HLD CKD stage 3 Hypothyroidsim Hip Fracture 04/2023  Covid infection 09/2023  Significant Hospital Events: Including procedures, antibiotic start and stop dates in addition to other pertinent events   3/11: Patient admitted with septic shock, likely due to UTI. Levophed/vasopressin. Bicarbonate drip/bolus's. Antibiotics, cultures, stress dose steroids.  Interim History / Subjective:    Objective   Blood pressure (!) 77/52, pulse (!) 38, temperature (!) 97 F (36.1 C), temperature source Oral, resp. rate (!) 21, weight 65 kg, SpO2 94%.        Intake/Output Summary (Last 24 hours) at 10/29/2023 0457 Last data filed at 10/29/2023 0420 Gross per 24 hour  Intake 2997.2 ml  Output --  Net 2997.2 ml   Filed Weights   10/29/23 0248  Weight: 65 kg   Physical Exam Vitals reviewed.  Constitutional:      Appearance: She is ill-appearing.  HENT:     Head: Normocephalic and atraumatic.     Right Ear: External ear normal.     Left Ear: External ear normal.     Nose: Nose normal.     Mouth/Throat:     Mouth: Mucous membranes are dry.  Eyes:     Pupils: Pupils are equal, round, and reactive to light.  Cardiovascular:     Rate and Rhythm: Tachycardia present.     Pulses: Normal pulses.     Heart sounds: Normal heart sounds.  Pulmonary:  Breath sounds: Normal breath sounds.     Comments: Tachypnic  Abdominal:     General: Bowel sounds are normal.     Tenderness: There is abdominal tenderness.     Comments: Diarrhea, rectal tube in place  Musculoskeletal:        General: Normal range of motion.     Cervical back: Normal range of motion.     Right lower leg: No edema.     Left lower leg: No edema.  Skin:    Capillary Refill: Capillary refill takes more than 3 seconds.     Coloration: Skin is  pale.     Comments: Mottled BUE/BLE, Hands dusky bilaterally  Cool  Neurological:     Comments: Opens eyes spontaneously, tracks/regards, Nods no occasionally, Minimal verbal output Not following commands     Resolved Hospital Problem list   None  Assessment & Plan:  Septic Shock, suspect due to Urosepsis vs. Norovirus vs. Covid  Lactic Acidosis/Metabolic Acidosis  Shocked Liver Hypercoagulable State, due to Septic Shock -s/p 3.5 liter in ER, additional 1 liter upon arrival as internal jugular vein collapsed via Ultrasound -Cultures  -UA + bacteria, bloody, Cx pending  -Respiratory panel + Covid (recent infection in January)  -BC NGTD, MRSA swab pending, Procalcitonin   -Stool cultures sent -Antibiotics  -Ceftriaxone/Azythromycin in ER  -Will continue with Vancomycin and Zosyn pending cultures -MAP goal > 65, arterial line placed -Levophed and Vasopressin infusions -Solucortef 100 mg BID -IVF's @ 250 ml/hr, decrease in overnight -Bicarb amp, total x 2, Bicarbonate infusion at 125 ml/hr -Trend lactic until clears -Follow LFTs, INR  Pre Diabetes  -HgbA1c 6.2 04/26/2023 -Glucose checks Q 4 hours with basal bolus insulin -Consider insulin infusion  CKD stage 3 (baseline creatinine 1.4-1.86) -Daily BMP -Avoid nephrotoxic agents   Hyperlipidemia -Awaiting Med Rec  Hyperthyroidism -Synthroid pending Med Rec  Depression RLS -Home Zoloft and Remeron pending Med Rec  Best Practice (right click and "Reselect all SmartList Selections" daily)   Diet/type: NPO DVT prophylaxis: SCDs, holding on SQH with elevated INR Pressure ulcer(s): N/A GI prophylaxis: N/A Lines: Central line, Arterial Line, and yes and it is still needed Foley:  N/A Code Status:  DNR per children, aggressive measures Last date of multidisciplinary goals of care discussion [10/29/2023]  Labs   CBC: Recent Labs  Lab 10/28/23 1938 10/29/23 0133  WBC 15.2* 31.5*  NEUTROABS 6.3  --   HGB 10.7*  11.2*  HCT 35.4* 36.7  MCV 109.6* 108.9*  PLT 190 133*   Basic Metabolic Panel: Recent Labs  Lab 10/28/23 1938 10/29/23 0133  NA 136 137  K 3.4* 3.7  CL 102 109  CO2 13* 14*  GLUCOSE 391* 257*  BUN 41* 42*  CREATININE 1.68* 1.76*  CALCIUM 9.2 7.9*  MG  --  1.9  PHOS  --  7.5*   GFR: Estimated Creatinine Clearance: 19.1 mL/min (A) (by C-G formula based on SCr of 1.76 mg/dL (H)). Recent Labs  Lab 10/28/23 1938 10/28/23 2158 10/29/23 0133  PROCALCITON  --   --  0.84  WBC 15.2*  --  31.5*  LATICACIDVEN >9.0* >9.0* 5.1*   Liver Function Tests: Recent Labs  Lab 10/28/23 1938  AST 110*  ALT 57*  ALKPHOS 100  BILITOT 0.6  PROT 5.0*  ALBUMIN 2.5*   ABG    Component Value Date/Time   PHART 7.03 (LL) 10/29/2023 0219   PCO2ART 50 (H) 10/29/2023 0219   PO2ART 207 (H) 10/29/2023 0219   HCO3  13.2 (L) 10/29/2023 0219   TCO2 24 07/17/2013 1944   ACIDBASEDEF 17.5 (H) 10/29/2023 0219   O2SAT 100 10/29/2023 0219    Coagulation Profile: Recent Labs  Lab 10/28/23 1938 10/29/23 0133  INR 1.4* 2.3*   Cardiac Enzymes: No results for input(s): "CKTOTAL", "CKMB", "CKMBINDEX", "TROPONINI" in the last 168 hours. HbA1C: Hgb A1c MFr Bld  Date/Time Value Ref Range Status  04/26/2023 02:20 PM 6.2 (H) 4.8 - 5.6 % Final    Comment:    (NOTE)         Prediabetes: 5.7 - 6.4         Diabetes: >6.4         Glycemic control for adults with diabetes: <7.0   01/22/2019 10:35 AM 6.3 4.6 - 6.5 % Final    Comment:    Glycemic Control Guidelines for People with Diabetes:Non Diabetic:  <6%Goal of Therapy: <7%Additional Action Suggested:  >8%    CBG: Recent Labs  Lab 10/28/23 2325 10/29/23 0331  GLUCAP 284* 200*   Review of Systems:   Unable to obtain due to mental status  Past Medical History:  She,  has a past medical history of Arthritis, Breast cancer (HCC), Diverticulosis, GERD (gastroesophageal reflux disease), History of colonic polyps, History of ERCP, Hyperlipidemia,  Migraine headache, Pulmonary nodule, RLS (restless legs syndrome), and Wears contact lenses.   Surgical History:   Past Surgical History:  Procedure Laterality Date   ABDOMINAL HYSTERECTOMY     bso   APPENDECTOMY     BREAST LUMPECTOMY  1995   lt [id number]   BREAST LUMPECTOMY WITH RADIOACTIVE SEED LOCALIZATION Left 07/01/2014   Procedure: LEFT BREAST LUMPECTOMY WITH RADIOACTIVE SEED LOCALIZATION;  Surgeon: Avel Peace, MD;  Location: [facility];  Service: General;  Laterality: Left;   CERVICAL SPINE SURGERY  3/03   CHOLECYSTECTOMY  2012   Complete Hysterectomy     CYSTOCELE REPAIR     ERCP     for abnormal lfts and dilated biliary ducts 5/05   gallbladder duct open  2006   HEMORRHOID SURGERY     HYSTEROSCOPY     INTRAMEDULLARY (IM) NAIL INTERTROCHANTERIC Left 04/26/2023   Procedure: INTRAMEDULLARY (IM) NAIL INTERTROCHANTERIC;  Surgeon: Joen Laura, MD;  Location: [facility];  Service: Orthopedics;  Laterality: Left;   LYMPH NODE BIOPSY     Orthoscopic Rt Knee     SPHINCTEROTOMY     for abnormal lfts and dilated biliary ducts 5/05   tonsillectomy       Social History:   reports that she quit smoking about 29 years ago. Her smoking use included cigarettes. She started smoking about 54 years ago. She has a 75 pack-year smoking history. She has never used smokeless tobacco. She reports that she does not drink alcohol and does not use drugs.   Family History:  Her family history includes Mitral valve prolapse in her son. There is no history of Esophageal cancer, Liver disease, or Stomach cancer.   Allergies Allergies  Allergen Reactions   Codeine Other (See Comments)    REACTION: Intolerance w/ pain meds not cough syrup. Codeine makes her bounce off the walls. falling REACTION: Intolerance w/ pain meds not cough syrup. Codeine makes her bounce off the walls.   Aspirin Other (See Comments)    GI issues GI issues     Home Medications  Prior to Admission  medications   Medication Sig Start Date End Date Taking? Authorizing Provider  vitamin B-12 (CYANOCOBALAMIN) 100 MCG tablet  Take 100 mcg by mouth daily.   Yes [provider]  CALCIUM PO Take 1 tablet by mouth daily.    [provider]  levothyroxine (SYNTHROID) 50 MCG tablet TAKE 1 TABLET(50 MCG) BY MOUTH DAILY 03/24/23   Panosh, Neta Mends, MD  Melatonin-Pyridoxine (MELATIN PO) Take 1 tablet by mouth at bedtime as needed (sleep).    [provider]  mirtazapine (REMERON) 15 MG tablet TAKE 1 TO 2 TABLETS BY MOUTH AT BEDTIME FOR SLEEP Patient taking differently: Take 15 mg by mouth at bedtime. TAKE 1 TO 2 TABLETS BY MOUTH AT BEDTIME FOR SLEEP 10/29/22   Panosh, Neta Mends, MD  polyethylene glycol (MIRALAX / GLYCOLAX) 17 g packet Take 17 g by mouth daily. Patient not taking: Reported on 08/13/2023 05/02/23   Dorcas Carrow, MD  senna-docusate (SENOKOT-S) 8.6-50 MG tablet Take 1 tablet by mouth 2 (two) times daily. Patient not taking: Reported on 08/13/2023 05/01/23   Dorcas Carrow, MD  sertraline (ZOLOFT) 100 MG tablet TAKE 1 TABLET(100 MG) BY MOUTH DAILY Patient taking differently: Take 100 mg by mouth daily. 12/16/22   Eulis Foster, FNP  zolpidem (AMBIEN) 5 MG tablet Take 1 tablet (5 mg total) by mouth at bedtime as needed for up to 7 days for sleep. 05/01/23 05/08/23  Dorcas Carrow, MD     Critical care time: 108 minutes

## 2023-10-29 NOTE — Consult Note (Signed)
PHARMACY CONSULT NOTE - ELECTROLYTES  Pharmacy Consult for Electrolyte Monitoring and Replacement   Recent Labs: Weight: 65 kg (143 lb 4.8 oz) Estimated Creatinine Clearance: 16.9 mL/min (A) (by C-G formula based on SCr of 1.99 mg/dL (H)). Potassium  Date Value  10/29/2023 4.3 mmol/L  04/17/2017 5.0 mEq/L   Magnesium (mg/dL)  Date Value  27/25/3664 1.9   Calcium (mg/dL)  Date Value  40/34/7425 8.0 (L)  04/17/2017 10.1   Albumin (g/dL)  Date Value  95/63/8756 2.5 (L)  04/17/2017 4.0   Phosphorus (mg/dL)  Date Value  43/32/9518 6.3 (H)   Sodium  Date Value  10/29/2023 136 mmol/L  04/17/2017 141 mEq/L   Assessment  Jeanne Tran is a 89 y.o. female presenting with hypotension. PMH significant for GERD, breast cancer. Pharmacy has been consulted to monitor and replace electrolytes.  Diet: NPO MIVF: LR @ 250 mL/hr Pertinent medications: N/A  Goal of Therapy: Electrolytes WNL  Plan:  No electrolyte replacement indicated at this time Check BMP daily  Thank you for allowing pharmacy to be a part of this patient's care.  Effie Shy, PharmD Pharmacy Resident  10/29/2023 11:43 AM

## 2023-10-29 NOTE — Plan of Care (Signed)
  Problem: Education: Goal: Knowledge of General Education information will improve Description: Including pain rating scale, medication(s)/side effects and non-pharmacologic comfort measures Outcome: Progressing   Problem: Health Behavior/Discharge Planning: Goal: Ability to manage health-related needs will improve Outcome: Progressing   Problem: Clinical Measurements: Goal: Ability to maintain clinical measurements within normal limits will improve Outcome: Progressing Goal: Will remain free from infection Outcome: Progressing Goal: Diagnostic test results will improve Outcome: Progressing Goal: Respiratory complications will improve Outcome: Progressing Goal: Cardiovascular complication will be avoided Outcome: Progressing   Problem: Activity: Goal: Risk for activity intolerance will decrease Outcome: Progressing   Problem: Nutrition: Goal: Adequate nutrition will be maintained Outcome: Progressing   Problem: Coping: Goal: Level of anxiety will decrease Outcome: Progressing   Problem: Elimination: Goal: Will not experience complications related to bowel motility Outcome: Progressing Goal: Will not experience complications related to urinary retention Outcome: Progressing   Problem: Pain Managment: Goal: General experience of comfort will improve and/or be controlled Outcome: Progressing   Problem: Safety: Goal: Ability to remain free from injury will improve Outcome: Progressing   Problem: Skin Integrity: Goal: Risk for impaired skin integrity will decrease Outcome: Progressing   Problem: Education: Goal: Ability to describe self-care measures that may prevent or decrease complications (Diabetes Survival Skills Education) will improve Outcome: Progressing Goal: Individualized Educational Video(s) Outcome: Progressing   Problem: Coping: Goal: Ability to adjust to condition or change in health will improve Outcome: Progressing   Problem: Fluid  Volume: Goal: Ability to maintain a balanced intake and output will improve Outcome: Progressing   Problem: Health Behavior/Discharge Planning: Goal: Ability to identify and utilize available resources and services will improve Outcome: Progressing Goal: Ability to manage health-related needs will improve Outcome: Progressing   Problem: Metabolic: Goal: Ability to maintain appropriate glucose levels will improve Outcome: Progressing   Problem: Nutritional: Goal: Maintenance of adequate nutrition will improve Outcome: Progressing Goal: Progress toward achieving an optimal weight will improve Outcome: Progressing   Problem: Skin Integrity: Goal: Risk for impaired skin integrity will decrease Outcome: Progressing   Problem: Tissue Perfusion: Goal: Adequacy of tissue perfusion will improve Outcome: Progressing   Problem: Education: Goal: Knowledge of risk factors and measures for prevention of condition will improve Outcome: Progressing   Problem: Coping: Goal: Psychosocial and spiritual needs will be supported Outcome: Progressing   Problem: Respiratory: Goal: Will maintain a patent airway Outcome: Progressing Goal: Complications related to the disease process, condition or treatment will be avoided or minimized Outcome: Progressing

## 2023-10-29 NOTE — Consult Note (Signed)
[facility] SURGICAL CONSULTATION NOTE (initial) - cpt: 99255   HISTORY OF PRESENT ILLNESS (HPI):  89 y.o. female presented to [facility] ED yesterday for a constellation of symptoms consistent with septic shock, precise source uncertain however current testing suggests C. difficile colitis is the most likely etiology. Patient is a bit unreliable as historian at this point in time.  She is being treated with oral vancomycin and IV metronidazole.  She also had antibiotics of Rocephin and azithromycin last night.  She is alert communicative though somewhat confused.  She does not indicate abdominal pain, nausea or vomiting, but reports not having diarrhea until she came to the hospital.  Surgery is consulted by ICU physician Dr. Larinda Buttery in this context for evaluation and management of C. difficile colitis.  She is clearly had persistent venous lactate, markedly elevated white blood cell count.  And acute renal insufficiency with diminished urine output.  Her blood pressures remain soft despite norepinephrine support.  CT scan examination last night was not impressive for remarkable colitis/colonic edema.    PAST MEDICAL HISTORY (PMH):  Past Medical History:  Diagnosis Date   Arthritis    Breast cancer (HCC)    hx of left with radiation and lumpectomy   Diverticulosis    GERD (gastroesophageal reflux disease)    History of colonic polyps    History of ERCP    and sphincterotomy for abnormal lfts and dilated biliary ducts 5/05   Hyperlipidemia    Migraine headache    Pulmonary nodule    RLS (restless legs syndrome)    Wears contact lenses      PAST SURGICAL HISTORY (PSH):  Past Surgical History:  Procedure Laterality Date   ABDOMINAL HYSTERECTOMY     bso   APPENDECTOMY     BREAST LUMPECTOMY  1995   lt lumpsnbx   BREAST LUMPECTOMY WITH RADIOACTIVE SEED LOCALIZATION Left 07/01/2014   Procedure: LEFT BREAST LUMPECTOMY WITH RADIOACTIVE SEED LOCALIZATION;  Surgeon: Avel Peace, MD;  Location: [facility];  Service: General;  Laterality: Left;   CERVICAL SPINE SURGERY  3/03   CHOLECYSTECTOMY  2012   Complete Hysterectomy     CYSTOCELE REPAIR     ERCP     for abnormal lfts and dilated biliary ducts 5/05   gallbladder duct open  2006   HEMORRHOID SURGERY     HYSTEROSCOPY     INTRAMEDULLARY (IM) NAIL INTERTROCHANTERIC Left 04/26/2023   Procedure: INTRAMEDULLARY (IM) NAIL INTERTROCHANTERIC;  Surgeon: Joen Laura, MD;  Location: [facility];  Service: Orthopedics;  Laterality: Left;   LYMPH NODE BIOPSY     Orthoscopic Rt Knee     SPHINCTEROTOMY     for abnormal lfts and dilated biliary ducts 5/05   tonsillectomy       MEDICATIONS:  Prior to Admission medications   Medication Sig Start Date End Date Taking? Authorizing Provider  acetaminophen (TYLENOL) 325 MG tablet Take 975 mg by mouth in the morning, at noon, and at bedtime.   Yes [provider]  levothyroxine (SYNTHROID) 75 MCG tablet Take 75 mcg by mouth daily. 10/07/23  Yes [provider]  mirtazapine (REMERON) 7.5 MG tablet Take 7.5 mg by mouth at bedtime. 10/27/23  Yes [provider]  vitamin B-12 (CYANOCOBALAMIN) 100 MCG tablet Take 100 mcg by mouth daily.   Yes [provider]  CALCIUM PO Take 1 tablet by mouth daily.    [provider]  Melatonin-Pyridoxine (MELATIN PO) Take 1 tablet by mouth  at bedtime as needed (sleep).    [provider]  polyethylene glycol (MIRALAX / GLYCOLAX) 17 g packet Take 17 g by mouth daily. Patient not taking: Reported on 08/13/2023 05/02/23   Dorcas Carrow, MD  senna-docusate (SENOKOT-S) 8.6-50 MG tablet Take 1 tablet by mouth 2 (two) times daily. Patient not taking: Reported on 08/13/2023 05/01/23   Dorcas Carrow, MD  sertraline (ZOLOFT) 100 MG tablet TAKE 1 TABLET(100 MG) BY MOUTH DAILY Patient taking differently: Take 100 mg by mouth daily. 12/16/22   Eulis Foster, FNP  zolpidem (AMBIEN)  5 MG tablet Take 1 tablet (5 mg total) by mouth at bedtime as needed for up to 7 days for sleep. 05/01/23 05/08/23  Dorcas Carrow, MD     ALLERGIES:  Allergies  Allergen Reactions   Codeine Other (See Comments)    REACTION: Intolerance w/ pain meds not cough syrup. Codeine makes her bounce off the walls. falling REACTION: Intolerance w/ pain meds not cough syrup. Codeine makes her bounce off the walls.   Aspirin Other (See Comments)    GI issues GI issues     SOCIAL HISTORY:  Social History   Socioeconomic History   Marital status: Widowed    Spouse name: Not on file   Number of children: 3   Years of education: Not on file   Highest education level: Not on file  Occupational History   Occupation: retired  Tobacco Use   Smoking status: Former    Current packs/day: 0.00    Average packs/day: 3.0 packs/day for 25.0 years (75.0 ttl pk-yrs)    Types: Cigarettes    Start date: 02/26/1969    Quit date: 02/26/1994    Years since quitting: 29.6   Smokeless tobacco: Never   Tobacco comments:    quit 24 years   Vaping Use   Vaping status: Never Used  Substance and Sexual Activity   Alcohol use: No   Drug use: No   Sexual activity: Not on file  Other Topics Concern   Not on file  Social History Narrative   Retired; widowed   2 sons   Plays bridge twice monthly   Still drives         Social Drivers of Corporate investment banker Strain: Low Risk  (01/16/2022)   Overall Financial Resource Strain (CARDIA)    Difficulty of Paying Living Expenses: Not hard at all  Food Insecurity: No Food Insecurity (04/26/2023)   Hunger Vital Sign    Worried About Running Out of Food in the Last Year: Never true    Ran Out of Food in the Last Year: Never true  Transportation Needs: No Transportation Needs (04/26/2023)   PRAPARE - Administrator, Civil Service (Medical): No    Lack of Transportation (Non-Medical): No  Physical Activity: Inactive (12/14/2021)   Exercise Vital Sign     Days of Exercise per Week: 0 days    Minutes of Exercise per Session: 0 min  Stress: No Stress Concern Present (12/14/2021)   [employer] of Occupational Health - Occupational Stress Questionnaire    Feeling of Stress : Not at all  Social Connections: Unknown (01/29/2022)   Received from [facility]   Social Network    Social Network: Not on file  Recent Concern: Social Connections - Socially Isolated (12/14/2021)   Social Connection and Isolation Panel [NHANES]    Frequency of Communication with Friends and Family: More than three times a week  Frequency of Social Gatherings with Friends and Family: More than three times a week    Attends Religious Services: Never    Database administrator or Organizations: No    Attends Banker Meetings: Never    Marital Status: Widowed  Intimate Partner Violence: Not At Risk (04/26/2023)   Humiliation, Afraid, Rape, and Kick questionnaire    Fear of Current or Ex-Partner: No    Emotionally Abused: No    Physically Abused: No    Sexually Abused: No     FAMILY HISTORY:  Family History  Problem Relation Age of Onset   Mitral valve prolapse Son        with afib to have surgery   Esophageal cancer Neg Hx    Liver disease Neg Hx    Stomach cancer Neg Hx       REVIEW OF SYSTEMS:  Review of Systems  Unable to perform ROS: Mental status change    VITAL SIGNS:  Temp:  [96.5 F (35.8 C)-97.9 F (36.6 C)] 97.9 F (36.6 C) (02/12 1200) Pulse Rate:  [31-130] 38 (02/12 0000) Resp:  [18-34] 18 (02/12 0600) BP: (51-127)/(31-94) 77/52 (02/12 0000) SpO2:  [76 %-100 %] 100 % (02/12 0600) Weight:  [65 kg] 65 kg (02/12 0248)       Weight: 65 kg     INTAKE/OUTPUT:  02/11 0701 - 02/12 0700 In: 3625.7 [I.V.:1472; IV Piggyback:2153.7] Out: 340 [Stool:340]  PHYSICAL EXAM:  Physical Exam Blood pressure (!) 77/52, pulse (!) 38, temperature 97.9 F (36.6 C), temperature source Axillary, resp. rate 18, weight 65  kg, SpO2 100%. Last Weight  Most recent update: 10/29/2023  2:49 AM    Weight  65 kg (143 lb 4.8 oz)             CONSTITUTIONAL: Well developed,  responsive and aware without distress.  Speech somewhat incoherent versus dementia/mental status change/delirium. EYES: Sclera non-icteric.   EARS, NOSE, MOUTH AND THROAT:  Hearing is intact to voice.  NECK: Trachea is midline, and there is no jugular venous distension.  LYMPH NODES:  Lymph nodes in the neck are not enlarged. RESPIRATORY:  Lungs are clear, and equal bilaterally.   Normal respiratory effort without pathologic use of accessory muscles. CARDIOVASCULAR: Heart is regular in rate and rhythm.   Extremities cool, but not mottled today. GI: The abdomen is without involuntary guarding, or rebound.  Nonperitonitic.  Soft right groin hernia tender at times but without incarceration.  Otherwise her abdomen seems nontender, and nondistended. There were no palpable masses. There were diminished bowel sounds. GU: Catheter in place, minimal evidence of urine output. MUSCULOSKELETAL: Trace edema, distal cool extremities.   SKIN: Skin turgor is normal. No pathologic skin lesions appreciated.  PSYCH:  Affect is appropriate for situation.  Data Reviewed I have personally reviewed what is currently available of the patient's imaging, recent labs and medical records.    Labs:     Latest Ref Rng & Units 10/29/2023    2:06 PM 10/29/2023    4:26 AM 10/29/2023    1:33 AM  CBC  WBC 4.0 - 10.5 K/uL 36.5  34.8  31.5   Hemoglobin 12.0 - 15.0 g/dL 78.2  95.6  21.3   Hematocrit 36.0 - 46.0 % 33.5  36.9  36.7   Platelets 150 - 400 K/uL 143  154  133       Latest Ref Rng & Units 10/29/2023   12:27 PM 10/29/2023    4:26 AM 10/29/2023  1:33 AM  CMP  Glucose 70 - 99 mg/dL 86  161  096   BUN 8 - 23 mg/dL 53  47  42   Creatinine 0.44 - 1.00 mg/dL 0.45  4.09  8.11   Sodium 135 - 145 mmol/L 135  136  137   Potassium 3.5 - 5.1 mmol/L 3.8  4.3  3.7    Chloride 98 - 111 mmol/L 101  105  109   CO2 22 - 32 mmol/L 17  15  14    Calcium 8.9 - 10.3 mg/dL 8.2  8.0  7.9   Total Protein 6.5 - 8.1 g/dL 4.6     Total Bilirubin 0.0 - 1.2 mg/dL 0.8     Alkaline Phos 38 - 126 U/L 190     AST 15 - 41 U/L 1,486     ALT 0 - 44 U/L 908        Imaging studies:   Last 24 hrs: DG Chest Port 1 View Result Date: 10/29/2023 CLINICAL DATA:  Central line placement EXAM: PORTABLE CHEST 1 VIEW COMPARISON:  10/28/2023 FINDINGS: Right central line tip at the cavoatrial junction. No pneumothorax. Heart and mediastinal contours within normal limits. Emphysema. Left lower lobe opacity could reflect atelectasis or infiltrate. No effusions. No acute bony abnormality. IMPRESSION: Right central line tip at the cavoatrial junction.  No pneumothorax. Left lower lobe atelectasis or infiltrate. Electronically Signed   By: Charlett Nose M.D.   On: 10/29/2023 01:00   CT CHEST ABDOMEN PELVIS WO CONTRAST Result Date: 10/28/2023 CLINICAL DATA:  Sepsis. Syncopal episode tonight when on the toilet. Altered mental status. Low blood pressures. Had COVID-19 in late January. EXAM: CT CHEST, ABDOMEN AND PELVIS WITHOUT CONTRAST TECHNIQUE: Multidetector CT imaging of the chest, abdomen and pelvis was performed following the standard protocol without IV contrast. RADIATION DOSE REDUCTION: This exam was performed according to the departmental dose-optimization program which includes automated exposure control, adjustment of the mA and/or kV according to patient size and/or use of iterative reconstruction technique. COMPARISON:  Same day chest radiograph; CT 07/20/2007 FINDINGS: CT CHEST FINDINGS Cardiovascular: No pericardial effusion. Advanced coronary artery and aortic atherosclerotic calcification. Mediastinum/Nodes: Trachea is unremarkable. Moderate to large hiatal hernia. No thoracic adenopathy. Lungs/Pleura: Advanced emphysema. Bronchial wall thickening. Scarring/atelectasis in the lung bases.  Multiple right middle lobe pulmonary nodules measuring up to 5 mm (series 4/image 84 and 4/94). These are stable since 2008 compatible with benign etiology. No follow-up recommended. No pleural effusion or pneumothorax. Musculoskeletal: No acute fracture. CT ABDOMEN PELVIS FINDINGS Hepatobiliary: Cholecystectomy. Nonspecific periportal edema. No definite biliary dilation. Pancreas: No acute abnormality. Spleen: Unremarkable. Adrenals/Urinary Tract: Unremarkable adrenal glands. No urinary calculi or hydronephrosis. Diffuse bladder wall thickening of the nondistended bladder. Stomach/Bowel: No evidence of bowel obstruction. Colonic diverticulosis without diverticulitis. Herniation of a nonobstructed loop of small bowel into the right inguinal hernia. Vascular/Lymphatic: Aortic atherosclerosis. No enlarged abdominal or pelvic lymph nodes. Reproductive: Hysterectomy. Other: Small volume free fluid in the pelvis. No free intraperitoneal air. Musculoskeletal: No acute fracture. Chronic compression fracture of L1. ORIF left femur. No acute fracture. IMPRESSION: 1. Nonspecific periportal edema. Differential considerations include but are not limited to volume overload or hepatitis. Correlate with LFTs 2. Diffuse bladder wall thickening of the nondistended bladder. Correlate with urinalysis to exclude cystitis. 3. Herniation of a nonobstructed loop of small bowel into the right inguinal hernia. 4. Moderate to large hiatal hernia. 5. Aortic Atherosclerosis (ICD10-I70.0) and Emphysema (ICD10-J43.9). Electronically Signed   By: Minerva Fester  M.D.   On: 10/28/2023 21:48   DG Chest Port 1 View Result Date: 10/28/2023 CLINICAL DATA:  Possible sepsis EXAM: PORTABLE CHEST 1 VIEW COMPARISON:  04/25/2023 FINDINGS: Low lung volumes. Surgical clips in the left axilla. Patchy airspace disease at left base. Stable cardiomediastinal silhouette with aortic atherosclerosis. No pneumothorax IMPRESSION: Low lung volumes with patchy  airspace disease at left base, atelectasis versus pneumonia. Electronically Signed   By: Jasmine Pang M.D.   On: 10/28/2023 20:27     Assessment/Plan:  89 y.o. female with sepsis/septic shock, potential etiology of C. difficile colitis, complicated by pertinent comorbidities including:  Patient Active Problem List   Diagnosis Date Noted   Septic shock (HCC) 10/28/2023   Malnutrition of moderate degree 04/30/2023   Closed left hip fracture, initial encounter (HCC) 04/26/2023   Compression fracture of L1 lumbar vertebra (HCC) 04/26/2023   Rhabdomyolysis 04/26/2023   Hypothyroidism 04/26/2023   CKD (chronic kidney disease) stage 4, GFR 15-29 ml/min (HCC) 04/26/2023   Hyperglycemia 04/26/2023   Rheumatoid arthritis (HCC) 04/30/2020   Osteopenia 10/01/2016   Ductal carcinoma in situ (DCIS) of left breast 05/09/2015   Multiple joint pain 10/18/2014   GI symptoms 10/18/2014   Hyperlipidemia 10/18/2014   Chronic renal insufficiency 09/26/2014   Swelling of both hands 09/26/2014   Elevated serum creatinine 08/14/2014   Medication management 08/14/2014   Dry skin dermatitis 05/18/2013   Medicare annual wellness visit, subsequent 04/27/2012   Abnormal TSH 04/27/2012   High risk medication use 10/20/2011   History of ERCP    TINGLING 03/06/2010   Osteoarthritis 10/27/2009   FECAL OCCULT BLOOD 02/21/2009   ESOPHAGEAL STRICTURE 08/04/2008   DIVERTICULOSIS OF COLON 08/04/2008   IRRITABLE BOWEL SYNDROME 08/04/2008   HIP PAIN, RIGHT 07/06/2008   NECK PAIN 07/06/2008   BACK PAIN, RIGHT 07/06/2008   HEADACHE 07/06/2008   ADJUSTMENT DISORDER WITH ANXIOUS MOOD 08/31/2007   HYPERLIPIDEMIA 06/01/2007   INSOMNIA, CHRONIC 06/01/2007   GERD 05/28/2007   BREAST CANCER, HX OF 05/28/2007   History of colonic polyps 05/28/2007    -I reached out to [employer], the patient's son, and I concur with his impression that surgical intervention, considering the risk of surviving the operation, let alone  the low potential reversal of the septic pattern, would be excessive, potentially unsurvivable or, at best, prolong life that she has not enjoyed living since her orthopedic procedure.  He will discuss her CODE STATUS further with other family members, I believe he has 2 sisters.  -We discussed that the best time for operative intervention is now, and unlikely to be helpful later should medical management not reverse this course.  However we also feel that it is likely futile considering her age and other comorbidities.  The consensus at present is to continue with medical management and maintain some degree of hope.  -Remaining management/care would be per PCCM service, we remain on standby to be of any further assistance.   Face-to-face time spent with the patient and accompanying care providers(if present) was 60 minutes, spent counseling, educating, and coordinating care of the patient.   Thank you for the opportunity to participate in this patient's care.   -- Campbell Lerner, M.D., FACS 10/29/2023, 3:23 PM

## 2023-10-29 NOTE — Inpatient Diabetes Management (Signed)
Inpatient Diabetes Program Recommendations  AACE/ADA: New Consensus Statement on Inpatient Glycemic Control (2015)  Target Ranges:  Prepandial:   less than 140 mg/dL      Peak postprandial:   less than 180 mg/dL (1-2 hours)      Critically ill patients:  140 - 180 mg/dL   Lab Results  Component Value Date   GLUCAP 82 10/29/2023   HGBA1C 5.6 10/28/2023    Latest Reference Range & Units 10/28/23 22:57 10/28/23 23:25 10/29/23 03:31 10/29/23 07:50  Glucose-Capillary 70 - 99 mg/dL 045 (H) 409 (H) 811 (H) Novolog 3 units 82  (H): Data is abnormally high  Diabetes history: No hx DM Current orders for Inpatient glycemic control: Novolog 0-15 units q 4 hrs.  Inpatient Diabetes Program Recommendations:   Please consider: -Decrease Novolog correction to 0-6 units q 4 hrs.  Thank you, Jeanne Tran. Mea Ozga, RN, MSN, CDCES  Diabetes Coordinator Inpatient Glycemic Control Team Team Pager [phone] (8am-5pm) 10/29/2023 9:39 AM

## 2023-10-29 NOTE — Procedures (Signed)
Arterial Catheter Insertion Procedure Note  Jeanne Tran  [id number]  Apr 10, 1934  Date:10/29/23  Time:2:05 AM    Provider Performing: Dahlia Byes    Procedure: Insertion of Arterial Line (70350) with US guidance ([id number])   Indication(s) Blood pressure monitoring and/or need for frequent ABGs  Consent Risks of the procedure as well as the alternatives and risks of each were explained to the patient and/or caregiver.  Via phone, verbal consent obtained  Anesthesia Lidocaine transdermal   Time Out Verified patient identification, verified procedure, site/side was marked, verified correct patient position, special equipment/implants available, medications/allergies/relevant history reviewed, required imaging and test results available.   Sterile Technique Maximal sterile technique including full sterile barrier drape, hand hygiene, sterile gown, sterile gloves, mask, hair covering, sterile ultrasound probe cover (if used).   Procedure Description Area of catheter insertion was cleaned with chlorhexidine and draped in sterile fashion. With real-time ultrasound guidance an arterial catheter was placed into the right radial artery.  Appropriate arterial tracings confirmed on monitor.     Complications/Tolerance None; patient tolerated the procedure well.   EBL Minimal   Specimen(s) None

## 2023-10-29 NOTE — Procedures (Signed)
Central Venous Catheter Insertion Procedure Note  NIKKO QUAST  [id number]  [birth year]  Date:10/29/23  Time:0100 AM   Provider Performing:Zeyad Delaguila Perlie Gold   Procedure: Insertion of Non-tunneled Central Venous [phone]) with US guidance ([id number])   Indication(s) Medication administration  Consent Risks of the procedure as well as the alternatives and risks of each were explained to the patient and/or caregiver, via phone, verbal consent given  Anesthesia Topical only with 1% lidocaine   Timeout Verified patient identification, verified procedure, site/side was marked, verified correct patient position, special equipment/implants available, medications/allergies/relevant history reviewed, required imaging and test results available.  Sterile Technique Maximal sterile technique including full sterile barrier drape, hand hygiene, sterile gown, sterile gloves, mask, hair covering, sterile ultrasound probe cover (if used).  Procedure Description Area of catheter insertion was cleaned with chlorhexidine and draped in sterile fashion.  With real-time ultrasound guidance a central venous catheter was placed into the right internal jugular vein. Nonpulsatile blood flow and easy flushing noted in all ports.  The catheter was sutured in place and sterile dressing applied.  Complications/Tolerance  Chest X-ray is ordered to verify placement for internal jugular or subclavian cannulation.    EBL Minimal  Specimen(s) None

## 2023-10-30 DIAGNOSIS — A0472 Enterocolitis due to Clostridium difficile, not specified as recurrent: Secondary | ICD-10-CM | POA: Diagnosis not present

## 2023-10-30 DIAGNOSIS — A419 Sepsis, unspecified organism: Secondary | ICD-10-CM | POA: Diagnosis not present

## 2023-10-30 DIAGNOSIS — R6521 Severe sepsis with septic shock: Secondary | ICD-10-CM | POA: Diagnosis not present

## 2023-10-30 LAB — BASIC METABOLIC PANEL
Anion gap: 14 (ref 5–15)
BUN: 61 mg/dL — ABNORMAL HIGH (ref 8–23)
CO2: 19 mmol/L — ABNORMAL LOW (ref 22–32)
Calcium: 8 mg/dL — ABNORMAL LOW (ref 8.9–10.3)
Chloride: 99 mmol/L (ref 98–111)
Creatinine, Ser: 3.09 mg/dL — ABNORMAL HIGH (ref 0.44–1.00)
GFR, Estimated: 14 mL/min — ABNORMAL LOW (ref >60)
Glucose, Bld: 180 mg/dL — ABNORMAL HIGH (ref 70–99)
Potassium: 4.3 mmol/L (ref 3.5–5.1)
Sodium: 132 mmol/L — ABNORMAL LOW (ref 135–145)

## 2023-10-30 LAB — CBC
HCT: 29.3 % — ABNORMAL LOW (ref 36.0–46.0)
Hemoglobin: 9.9 g/dL — ABNORMAL LOW (ref 12.0–15.0)
MCH: 32.6 pg (ref 26.0–34.0)
MCHC: 33.8 g/dL (ref 30.0–36.0)
MCV: 96.4 fL (ref 80.0–100.0)
Platelets: 147 10*3/uL — ABNORMAL LOW (ref 150–400)
RBC: 3.04 MIL/uL — ABNORMAL LOW (ref 3.87–5.11)
RDW: 14.1 % (ref 11.5–15.5)
WBC: 31.2 10*3/uL — ABNORMAL HIGH (ref 4.0–10.5)
nRBC: 0.2 % (ref 0.0–0.2)

## 2023-10-30 LAB — HEPATIC FUNCTION PANEL
ALT: 599 U/L — ABNORMAL HIGH (ref 0–44)
AST: 642 U/L — ABNORMAL HIGH (ref 15–41)
Albumin: 2.5 g/dL — ABNORMAL LOW (ref 3.5–5.0)
Alkaline Phosphatase: 139 U/L — ABNORMAL HIGH (ref 38–126)
Bilirubin, Direct: <0.1 mg/dL (ref 0.0–0.2)
Total Bilirubin: 0.9 mg/dL (ref 0.0–1.2)
Total Protein: 5 g/dL — ABNORMAL LOW (ref 6.5–8.1)

## 2023-10-30 LAB — GLUCOSE, CAPILLARY
Glucose-Capillary: 106 mg/dL — ABNORMAL HIGH (ref 70–99)
Glucose-Capillary: 112 mg/dL — ABNORMAL HIGH (ref 70–99)
Glucose-Capillary: 123 mg/dL — ABNORMAL HIGH (ref 70–99)
Glucose-Capillary: 125 mg/dL — ABNORMAL HIGH (ref 70–99)
Glucose-Capillary: 129 mg/dL — ABNORMAL HIGH (ref 70–99)
Glucose-Capillary: 137 mg/dL — ABNORMAL HIGH (ref 70–99)
Glucose-Capillary: 160 mg/dL — ABNORMAL HIGH (ref 70–99)
Glucose-Capillary: 178 mg/dL — ABNORMAL HIGH (ref 70–99)

## 2023-10-30 LAB — URINE CULTURE: Culture: 20000 — AB

## 2023-10-30 LAB — LACTIC ACID, PLASMA: Lactic Acid, Venous: 3.4 mmol/L (ref 0.5–1.9)

## 2023-10-30 LAB — PHOSPHORUS: Phosphorus: 4.9 mg/dL — ABNORMAL HIGH (ref 2.5–4.6)

## 2023-10-30 LAB — MAGNESIUM: Magnesium: 1.5 mg/dL — ABNORMAL LOW (ref 1.7–2.4)

## 2023-10-30 MED ORDER — HEPARIN SODIUM (PORCINE) 5000 UNIT/ML IJ SOLN
5000.0000 [IU] | Freq: Three times a day (TID) | INTRAMUSCULAR | Status: DC
Start: 2023-10-30 — End: 2023-11-03
  Administered 2023-10-30 – 2023-11-02 (×10): 5000 [IU] via SUBCUTANEOUS
  Filled 2023-10-30 (×10): qty 1

## 2023-10-30 MED ORDER — SERTRALINE HCL 50 MG PO TABS
100.0000 mg | ORAL_TABLET | Freq: Every day | ORAL | Status: DC
  Administered 2023-10-30 – 2023-11-04 (×5): 100 mg via ORAL
  Filled 2023-10-30 (×5): qty 2

## 2023-10-30 MED ORDER — MAGNESIUM SULFATE 2 GM/50ML IV SOLN
2.0000 g | Freq: Once | INTRAVENOUS | Status: AC
  Administered 2023-10-30: 2 g via INTRAVENOUS
  Filled 2023-10-30: qty 50

## 2023-10-30 MED ORDER — LACTATED RINGERS IV BOLUS
500.0000 mL | Freq: Once | INTRAVENOUS | Status: AC
  Administered 2023-10-30: 500 mL via INTRAVENOUS

## 2023-10-30 MED ORDER — MAGNESIUM SULFATE IN D5W 1-5 GM/100ML-% IV SOLN
1.0000 g | Freq: Once | INTRAVENOUS | Status: DC
Start: 2023-10-30 — End: 2023-10-30
  Filled 2023-10-30: qty 100

## 2023-10-30 MED ORDER — LEVOTHYROXINE SODIUM 50 MCG PO TABS
75.0000 ug | ORAL_TABLET | Freq: Every day | ORAL | Status: DC
  Administered 2023-10-30 – 2023-11-04 (×5): 75 ug via ORAL
  Filled 2023-10-30 (×3): qty 1
  Filled 2023-10-30 (×2): qty 2

## 2023-10-30 NOTE — Progress Notes (Signed)
[facility] SURGICAL PROGRESS NOTE (cpt [phone])  Hospital Day(s): 2.   Post op day(s):  Marland Kitchen   Interval History: Patient seen and examined, no acute events or new complaints overnight. Patient reports about the same, denies any distress.  She remains on NE drip, despite improved BP.  Renal function worsening.   Lactate still elevated at 3.4  Review of Systems:  Unable to obtain.     Vital signs in last 24 hours: [min-max] current  Temp:  [97.3 F (36.3 C)-98.5 F (36.9 C)] 98.5 F (36.9 C) (02/13 0800) Pulse Rate:  [62-110] 83 (02/13 0800) Resp:  [14-33] 26 (02/13 0800) BP: (126-168)/(63-92) 136/90 (02/13 0800) SpO2:  [91 %-98 %] 92 % (02/13 0500) Weight:  [65.9 kg] 65.9 kg (02/13 0500)       Weight: 65.9 kg     Intake/Output last 2 shifts:  02/12 0701 - 02/13 0700 In: 4745.8 [P.O.:200; I.V.:2580.5; IV Piggyback:1905.3] Out: 892 [Urine:12; Stool:880]   Physical Exam:  Constitutional: arousable, cooperative and no distress  HENT: normocephalic without obvious/focal abnormality  Respiratory: breathing non-labored at rest  Cardiovascular: regular rate and sinus rhythm  Gastrointestinal: diffuse subjective tenderness, but w/o guarding or rigidity, and non-distended.  RLQ hernia site non-tender.  Musculoskeletal: no edema or wounds, NT, warm   Labs:     Latest Ref Rng & Units 10/30/2023    4:56 AM 10/29/2023    2:06 PM 10/29/2023    4:26 AM  CBC  WBC 4.0 - 10.5 K/uL 31.2  36.5  34.8   Hemoglobin 12.0 - 15.0 g/dL 9.9  60.4  54.0   Hematocrit 36.0 - 46.0 % 29.3  33.5  36.9   Platelets 150 - 400 K/uL 147  143  154       Latest Ref Rng & Units 10/30/2023    4:56 AM 10/29/2023   12:27 PM 10/29/2023    4:26 AM  CMP  Glucose 70 - 99 mg/dL 981  86  191   BUN 8 - 23 mg/dL 61  53  47   Creatinine 0.44 - 1.00 mg/dL 4.78  2.95  6.21   Sodium 135 - 145 mmol/L 132  135  136   Potassium 3.5 - 5.1 mmol/L 4.3  3.8  4.3   Chloride 98 - 111 mmol/L 99  101  105   CO2 22 -  32 mmol/L 19  17  15    Calcium 8.9 - 10.3 mg/dL 8.0  8.2  8.0   Total Protein 6.5 - 8.1 g/dL 5.0  4.6    Total Bilirubin 0.0 - 1.2 mg/dL 0.9  0.8    Alkaline Phos 38 - 126 U/L 139  190    AST 15 - 41 U/L 642  1,486    ALT 0 - 44 U/L 599  908       Imaging studies: No new pertinent imaging studies   Assessment/Plan:  89 y.o. female with c.diff colitis, with sepsis and progressive AKI, complicated by pertinent comorbidities including: Patient Active Problem List   Diagnosis Date Noted   C. difficile colitis 10/29/2023   Septic shock (HCC) 10/28/2023   Malnutrition of moderate degree 04/30/2023   Closed left hip fracture, initial encounter (HCC) 04/26/2023   Compression fracture of L1 lumbar vertebra (HCC) 04/26/2023   Rhabdomyolysis 04/26/2023   Hypothyroidism 04/26/2023   CKD (chronic kidney disease) stage 4, GFR 15-29 ml/min (HCC) 04/26/2023   Hyperglycemia 04/26/2023   Rheumatoid arthritis (HCC) 04/30/2020   Osteopenia 10/01/2016  Ductal carcinoma in situ (DCIS) of left breast 05/09/2015   Multiple joint pain 10/18/2014   GI symptoms 10/18/2014   Hyperlipidemia 10/18/2014   Chronic renal insufficiency 09/26/2014   Swelling of both hands 09/26/2014   Elevated serum creatinine 08/14/2014   Medication management 08/14/2014   Dry skin dermatitis 05/18/2013   Medicare annual wellness visit, subsequent 04/27/2012   Abnormal TSH 04/27/2012   High risk medication use 10/20/2011   History of ERCP    TINGLING 03/06/2010   Osteoarthritis 10/27/2009   FECAL OCCULT BLOOD 02/21/2009   ESOPHAGEAL STRICTURE 08/04/2008   DIVERTICULOSIS OF COLON 08/04/2008   IRRITABLE BOWEL SYNDROME 08/04/2008   HIP PAIN, RIGHT 07/06/2008   NECK PAIN 07/06/2008   BACK PAIN, RIGHT 07/06/2008   HEADACHE 07/06/2008   ADJUSTMENT DISORDER WITH ANXIOUS MOOD 08/31/2007   HYPERLIPIDEMIA 06/01/2007   INSOMNIA, CHRONIC 06/01/2007   GERD 05/28/2007   BREAST CANCER, HX OF 05/28/2007   History of colonic  polyps 05/28/2007     - Still no plan for OR as d/w Son yesterday, excessively morbid, and likely futile to produce meaningful post-op convalescence.    - Medical mgmt per PCCM.    - Surgery remains available, but will follow peripherally.    - advise palliative care   -- Campbell Lerner M.D., [facility] 10/30/2023 8:47 AM

## 2023-10-30 NOTE — Progress Notes (Addendum)
NAME:  Jeanne Tran, MRN:  [id number], DOB:  [birth year], LOS: 2 ADMISSION DATE:  10/28/2023 History of Present Illness:  Jeanne Tran is an 89 year old with a PMH significant for HLD, GERD, CKD stage 3, Hypothyroidism, Pulmonary nodule, RLS, Breast cancer, Hip fracture 04/2023 that presented to the ER 10/28/2023 with AMS. To review, according to son and chart review patient had a syncopal episode at her facility while on the toilet. EMS was called, BP 60/41, received 1.5 liters IVF. Upon arrival to the ER, Temp 96.5, 100% NRB mask, she was moaning, nodding no to pain, not following commands. Labs revealed WBC 15.2 LA >9. CO2 13. BUN/Cr 41/1.68, prior range Cr 1.4-1.86. Mild AST/ALT elevation, UA positive. Patient was started on Ceftriaxone and Azythromycin, cultures sent, and received an additional 2.5 liter IVF. CT abd/pelvis periportal edema, bladder wall thickening, inguinal hernia, hiatal hernia, aortic atherosclerosis, and Emphysema. She was then admitted to the ICU for further management.   Upon arrival she was mottled, lactic remained > 9, bicarb 13.2, glucose 284, INR 2.3, ABG Ph 7.03, CO2 50, Bicarb 13.2, PO2 207. Lines placed, Vasopressin added, and patient received and amp of bicarbonate and started on a bicarb infusion.  Patient was escalated to Vancomycin and Zosyn pending cultures. With escalation of pressors she was ultimately started on stress dose steroids. Additionally she has had numerous episodes of liquid diarrhea, now with rectal tube in place, stool cultures sent. According to son patient has not been febrile, nausea, vomiting or diarrhea. He states that since her hip fracture last August she has been declining and has been at a facility since. She is unable to ambulate, stand/pivot to commode only. He discussed with his siblings and they have made her a DNR, they believe her quality of life prior to this admission was likely unacceptable to her. Will continue with all aggressive  measures for now.    PCCM consulted for admission due to Septic Shock.  Pertinent  Medical History  HLD CKD stage 3 Hypothyroidsim Hip Fracture 04/2023  Covid infection 09/2023  Significant Hospital Events: Including procedures, antibiotic start and stop dates in addition to other pertinent events   2/11: Patient admitted with septic shock, likely due to UTI. Levophed/vasopressin. Bicarbonate drip/bolus's. Antibiotics, cultures, stress dose steroids.  02/12: C.diff +. Started on Flagyl and PO vanco. Not a candidate for Surgery.   Interim History / Subjective:  Patient seen and evaluated this AM. Mental status with some improvement. Kidney function slightly worse today with Cr. At 3.09 mg/dl.  Lactic acid stable at 3.4  Pressor requirement improving.   Objective   Blood pressure (!) 151/84, pulse 95, temperature 99.6 F (37.6 C), temperature source Axillary, resp. rate (!) 24, weight 65.9 kg, SpO2 94%.        Intake/Output Summary (Last 24 hours) at 10/30/2023 1447 Last data filed at 10/30/2023 1350 Gross per 24 hour  Intake 4061.91 ml  Output 927 ml  Net 3134.91 ml   Filed Weights   10/29/23 0248 10/30/23 0500  Weight: 65 kg 65.9 kg    Examination: General: Elderly, no acute distress HENT: Supple neck reactive pupils Lungs: Clear bilateral air entry Cardiovascular: Normal S1, Normal S2, RRR Abdomen: Soft, mildly tender over the RLQ Extremities: Warm and well perfused, no edema.   Labs and imaging were reviewed.   Assessment & Plan:  This is a case of an 89 year old female with a history of hyperlipidemia, GERD, CKD stage III, hypothyroidism, breast cancer, hip fracture  in August 2024 presenting to [facility] on 02/11 with shock.  Found to have a UTI.  Admitted for pressor support.  # Shock likely distributive/septic in the setting of # Urosepsis with UA with extensive bacteria and turbid appearing urinalysis, leukocytosis and  #C.diff colitis (Fulminant  C.diff) not a candidate for surgical intervention. # Lactic acidosis with downtrending lactate currently at 4.0 --> 3.2secondary to the above. # AKI with creatinine 2.2 mg/dL from a baseline of 1.5 mg/dL i likely prerenal azotemia in the setting of sepsis. # Hepatitis with elevated AST at 1400 and elevated ALT at 900 likely in the setting of shock liver # COVID-19 positive on 120 5 repeat testing positive as well # Hypothyroidism  Neuro: Alert and oriented.  Delirium precautions. Restart home synthroid and zoloft. CVS: Levo for MAP greater than 65 downtrending.  Continue with Decadron 6 mg daily (although low suspicion that COVID is active) Lungs: SpO2 goal greater than 90% ID: Continue Zosyn pending micro diagnosis. Flagyl IV and PO vanc 500Q6H.  Follow-up urine culture and blood cultures. GI.  Okay for clear liquid diet. Renal: Monitor urine output. Avoid nephrotoxic agent.  Heme: Heparin SubQ.   Best Practice (right click and "Reselect all SmartList Selections" daily)   Diet/type: clear liquids DVT prophylaxis SCD Pressure ulcer(s): N/A GI prophylaxis: N/A Lines: Central line Foley:  Yes, and it is still needed Code Status:  DNR  Last date of multidisciplinary goals of care discussion [10/29/2023]  I spent 50 minutes caring for this patient today, including preparing to see the patient, obtaining a medical history , reviewing a separately obtained history, performing a medically appropriate examination and/or evaluation, counseling and educating the patient/family/caregiver, documenting clinical information in the electronic health record, and independently interpreting results (not separately reported/billed) and communicating results to the patient/family/caregiver  Janann Colonel, MD [facility] 10/30/2023 2:47 PM

## 2023-10-30 NOTE — Consult Note (Addendum)
PHARMACY CONSULT NOTE - ELECTROLYTES  Pharmacy Consult for Electrolyte Monitoring and Replacement   Recent Labs: Weight: 65.9 kg (145 lb 4.5 oz) Estimated Creatinine Clearance: 11.8 mL/min (A) (by C-G formula based on SCr of 3.09 mg/dL (H)). Potassium  Date Value  10/30/2023 4.3 mmol/L  04/17/2017 5.0 mEq/L   Magnesium (mg/dL)  Date Value  95/62/1308 1.5 (L)   Calcium (mg/dL)  Date Value  65/78/4696 8.0 (L)  04/17/2017 10.1   Albumin (g/dL)  Date Value  29/52/8413 2.5 (L)  04/17/2017 4.0   Phosphorus (mg/dL)  Date Value  24/40/1027 4.9 (H)   Sodium  Date Value  10/30/2023 132 mmol/L (L)  04/17/2017 141 mEq/L   Assessment  Jeanne Tran is a 89 y.o. female presenting with hypotension. PMH significant for GERD, breast cancer. Pharmacy has been consulted to monitor and replace electrolytes.  Diet: NPO MIVF: NA Pertinent medications: N/A  Goal of Therapy: Electrolytes WNL  Plan:  Replace with 2g of magnesium sulfate x 1 No other replacement indicated at this time Check BMP daily  Thank you for allowing pharmacy to be a part of this patient's care.  Effie Shy, PharmD Pharmacy Resident  10/30/2023 6:58 AM

## 2023-10-31 ENCOUNTER — Inpatient Hospital Stay: Payer: Medicare Other

## 2023-10-31 DIAGNOSIS — A419 Sepsis, unspecified organism: Secondary | ICD-10-CM | POA: Diagnosis not present

## 2023-10-31 DIAGNOSIS — R6521 Severe sepsis with septic shock: Secondary | ICD-10-CM | POA: Diagnosis not present

## 2023-10-31 LAB — BASIC METABOLIC PANEL
Anion gap: 15 (ref 5–15)
BUN: 73 mg/dL — ABNORMAL HIGH (ref 8–23)
CO2: 19 mmol/L — ABNORMAL LOW (ref 22–32)
Calcium: 7.9 mg/dL — ABNORMAL LOW (ref 8.9–10.3)
Chloride: 97 mmol/L — ABNORMAL LOW (ref 98–111)
Creatinine, Ser: 4.2 mg/dL — ABNORMAL HIGH (ref 0.44–1.00)
GFR, Estimated: 10 mL/min — ABNORMAL LOW (ref >60)
Glucose, Bld: 118 mg/dL — ABNORMAL HIGH (ref 70–99)
Potassium: 4.5 mmol/L (ref 3.5–5.1)
Sodium: 131 mmol/L — ABNORMAL LOW (ref 135–145)

## 2023-10-31 LAB — GLUCOSE, CAPILLARY
Glucose-Capillary: 85 mg/dL (ref 70–99)
Glucose-Capillary: 87 mg/dL (ref 70–99)
Glucose-Capillary: 115 mg/dL — ABNORMAL HIGH (ref 70–99)
Glucose-Capillary: 103 mg/dL — ABNORMAL HIGH (ref 70–99)
Glucose-Capillary: 124 mg/dL — ABNORMAL HIGH (ref 70–99)

## 2023-10-31 LAB — CBC
HCT: 25.1 % — ABNORMAL LOW (ref 36.0–46.0)
Hemoglobin: 8.9 g/dL — ABNORMAL LOW (ref 12.0–15.0)
MCH: 33 pg (ref 26.0–34.0)
MCHC: 35.5 g/dL (ref 30.0–36.0)
MCV: 93 fL (ref 80.0–100.0)
Platelets: 109 10*3/uL — ABNORMAL LOW (ref 150–400)
RBC: 2.7 MIL/uL — ABNORMAL LOW (ref 3.87–5.11)
RDW: 14.1 % (ref 11.5–15.5)
WBC: 25.3 10*3/uL — ABNORMAL HIGH (ref 4.0–10.5)
nRBC: 0.1 % (ref 0.0–0.2)

## 2023-10-31 LAB — PHOSPHORUS: Phosphorus: 5.1 mg/dL — ABNORMAL HIGH (ref 2.5–4.6)

## 2023-10-31 LAB — MAGNESIUM: Magnesium: 2.3 mg/dL (ref 1.7–2.4)

## 2023-10-31 MED ORDER — AMIODARONE IV BOLUS ONLY 150 MG/100ML: 150.0000 mg | Freq: Once | INTRAVENOUS | Status: DC

## 2023-10-31 MED ORDER — SODIUM BICARBONATE 8.4 % IV SOLN
INTRAVENOUS | Status: DC
  Filled 2023-10-31: qty 1000
  Filled 2023-10-31 (×2): qty 150

## 2023-10-31 NOTE — Progress Notes (Signed)
NAME:  Jeanne Tran, MRN:  [id number], DOB:  [birth year], LOS: 3 ADMISSION DATE:  10/28/2023 History of Present Illness:  Jeanne Tran is an 89 year old with a PMH significant for HLD, GERD, CKD stage 3, Hypothyroidism, Pulmonary nodule, RLS, Breast cancer, Hip fracture 04/2023 that presented to the ER 10/28/2023 with AMS. To review, according to son and chart review patient had a syncopal episode at her facility while on the toilet. EMS was called, BP 60/41, received 1.5 liters IVF. Upon arrival to the ER, Temp 96.5, 100% NRB mask, she was moaning, nodding no to pain, not following commands. Labs revealed WBC 15.2 LA >9. CO2 13. BUN/Cr 41/1.68, prior range Cr 1.4-1.86. Mild AST/ALT elevation, UA positive. Patient was started on Ceftriaxone and Azythromycin, cultures sent, and received an additional 2.5 liter IVF. CT abd/pelvis periportal edema, bladder wall thickening, inguinal hernia, hiatal hernia, aortic atherosclerosis, and Emphysema. She was then admitted to the ICU for further management.   Upon arrival she was mottled, lactic remained > 9, bicarb 13.2, glucose 284, INR 2.3, ABG Ph 7.03, CO2 50, Bicarb 13.2, PO2 207. Lines placed, Vasopressin added, and patient received and amp of bicarbonate and started on a bicarb infusion.  Patient was escalated to Vancomycin and Zosyn pending cultures. With escalation of pressors she was ultimately started on stress dose steroids. Additionally she has had numerous episodes of liquid diarrhea, now with rectal tube in place, stool cultures sent. According to son patient has not been febrile, nausea, vomiting or diarrhea. He states that since her hip fracture last August she has been declining and has been at a facility since. She is unable to ambulate, stand/pivot to commode only. He discussed with his siblings and they have made her a DNR, they believe her quality of life prior to this admission was likely unacceptable to her. Will continue with all aggressive  measures for now.    PCCM consulted for admission due to Septic Shock.  Pertinent  Medical History  HLD CKD stage 3 Hypothyroidsim Hip Fracture 04/2023  Covid infection 09/2023  Significant Hospital Events: Including procedures, antibiotic start and stop dates in addition to other pertinent events   2/11: Patient admitted with septic shock, likely due to UTI. Levophed/vasopressin. Bicarbonate drip/bolus's. Antibiotics, cultures, stress dose steroids.  02/12: C.diff +. Started on Flagyl and PO vanco. Not a candidate for Surgery.  02/13: Hemodynamically improving. Mental status improved.   Interim History / Subjective:  Patient seen and evaluated this AM. Mental status improved significantly. Alert and oriented x3 to self place and time.  Kidney function continues to worsen now at 4.2 mg/dl from a baseline of 1.5mg /dl.   Lactic acid stable at 3.4  Off pressors since yesterday.   Objective   Blood pressure (!) 137/91, pulse 89, temperature 98.2 F (36.8 C), temperature source Oral, resp. rate 20, weight 66.3 kg, SpO2 97%.        Intake/Output Summary (Last 24 hours) at 10/31/2023 0857 Last data filed at 10/31/2023 0700 Gross per 24 hour  Intake 1494.92 ml  Output 830 ml  Net 664.92 ml   Filed Weights   10/29/23 0248 10/30/23 0500 10/31/23 0454  Weight: 65 kg 65.9 kg 66.3 kg    Examination: General: Elderly, no acute distress, alert and oriented x3.  HENT: Supple neck reactive pupils Lungs: Clear bilateral air entry Cardiovascular: Normal S1, Normal S2, RRR Abdomen: Soft, mildly tender over the RLQ Extremities: Warm and well perfused, no edema.   Labs and imaging  were reviewed.   Assessment & Plan:  This is a case of an 89 year old female with a history of hyperlipidemia, GERD, CKD stage III, hypothyroidism, breast cancer, hip fracture in August 2024 presenting to [facility] on 02/11 with septic shock secondary to C.diff colitis and UTI.   # Shock likely  distributive/septic in the setting of (Resolved) # Urosepsis with UA with extensive bacteria and turbid appearing urinalysis, leukocytosis urine culture with proteus mirabilis and  #C.diff colitis (Fulminant C.diff) not a candidate for surgical intervention. # Lactic acidosis with downtrending lactate currently at 4.0 --> 3.2secondary to the above. # AKI with creatinine 2.2 mg/dL from a baseline of 1.5 mg/dL i likely prerenal azotemia in the setting of sepsis.Worsening currently at 4.2 mg/dl.  # Hepatitis with elevated AST at 1400 and elevated ALT at 900 likely in the setting of shock liver downtrending.  # COVID-19 positive on 120 5 repeat testing positive as well # Hypothyroidism  Neuro: Alert and oriented.  Delirium precautions. c/w home synthroid and zoloft. CVS: Off pressors since yesterday.  Continue with Decadron 6 mg daily (although low suspicion that COVID is active) Lungs: SpO2 goal greater than 90% ID: Continue Ceftriaxone. Flagyl IV and PO vanc 500Q6H.   GI.  Okay for clear liquid diet and advance as tolerated. Renal: Monitor urine output. Avoid nephrotoxic agent. Renal consult.  Heme: Heparin SubQ.   Best Practice (right click and "Reselect all SmartList Selections" daily)   Diet/type: clear liquids DVT prophylaxis SCD Pressure ulcer(s): N/A GI prophylaxis: N/A Lines: Central line Foley:  Yes, and it is still needed Code Status:  DNR  Last date of multidisciplinary goals of care discussion [10/29/2023]  I spent 40 minutes caring for this patient today, including preparing to see the patient, obtaining a medical history , reviewing a separately obtained history, performing a medically appropriate examination and/or evaluation, counseling and educating the patient/family/caregiver, documenting clinical information in the electronic health record, and independently interpreting results (not separately reported/billed) and communicating results to the  patient/family/caregiver  Janann Colonel, MD [facility] 10/31/2023 8:57 AM

## 2023-10-31 NOTE — TOC Initial Note (Signed)
Transition of Care [facility]) - Initial/Assessment Note    Patient Details  Name: Jeanne Tran MRN: [id number] Date of Birth: 04/08/1934  Transition of Care [facility]) CM/SW Contact:    Allena Katz, LCSW Phone Number: 10/31/2023, 10:17 AM  Clinical Narrative:    Pt admitted with septic shock from [facility]. Pt not fully oriented at this time. Pt has been having trouble ambulating prior to admission. CSW waiting on PT/OT to see patient before assessing discharge needs.               Expected Discharge Plan: Skilled Nursing Facility     Patient Goals and CMS Choice            Expected Discharge Plan and Services                                              Prior Living Arrangements/Services   Lives with:: Facility Resident Patient language and need for interpreter reviewed:: Yes                 Activities of Daily Living      Permission Sought/Granted                  Emotional Assessment       Orientation: : Fluctuating Orientation (Suspected and/or reported Sundowners)      Admission diagnosis:  Septic shock (HCC) [A41.9, R65.21] Patient Active Problem List   Diagnosis Date Noted   C. difficile colitis 10/29/2023   Septic shock (HCC) 10/28/2023   Malnutrition of moderate degree 04/30/2023   Closed left hip fracture, initial encounter (HCC) 04/26/2023   Compression fracture of L1 lumbar vertebra (HCC) 04/26/2023   Rhabdomyolysis 04/26/2023   Hypothyroidism 04/26/2023   CKD (chronic kidney disease) stage 4, GFR 15-29 ml/min (HCC) 04/26/2023   Hyperglycemia 04/26/2023   Rheumatoid arthritis (HCC) 04/30/2020   Osteopenia 10/01/2016   Ductal carcinoma in situ (DCIS) of left breast 05/09/2015   Multiple joint pain 10/18/2014   GI symptoms 10/18/2014   Hyperlipidemia 10/18/2014   Chronic renal insufficiency 09/26/2014   Swelling of both hands 09/26/2014   Elevated serum creatinine 08/14/2014   Medication management 08/14/2014    Dry skin dermatitis 05/18/2013   Medicare annual wellness visit, subsequent 04/27/2012   Abnormal TSH 04/27/2012   High risk medication use 10/20/2011   History of ERCP    TINGLING 03/06/2010   Osteoarthritis 10/27/2009   FECAL OCCULT BLOOD 02/21/2009   ESOPHAGEAL STRICTURE 08/04/2008   DIVERTICULOSIS OF COLON 08/04/2008   IRRITABLE BOWEL SYNDROME 08/04/2008   HIP PAIN, RIGHT 07/06/2008   NECK PAIN 07/06/2008   BACK PAIN, RIGHT 07/06/2008   HEADACHE 07/06/2008   ADJUSTMENT DISORDER WITH ANXIOUS MOOD 08/31/2007   HYPERLIPIDEMIA 06/01/2007   INSOMNIA, CHRONIC 06/01/2007   GERD 05/28/2007   BREAST CANCER, HX OF 05/28/2007   History of colonic polyps 05/28/2007   PCP:  Madelin Headings, MD Pharmacy:   [facility] [phone] [place], [place] - [address] AT [facility] OF [address] & [facility] [address] [place] [address] Phone: [phone] Fax: [phone]  [facility] [place], [place] - [address] [place] [address] Phone: [phone] Fax: [phone]  [id number] - [place], [place] - [address] [place] [address] Phone:  [phone] Fax: [phone]  [id number] - [place], [place] - [address] [place] [address] Phone: [phone] Fax: [phone]     Social Drivers of Health (SDOH) Social History: SDOH Screenings   Food Insecurity: No Food Insecurity (04/26/2023)  Housing: Low Risk  (04/26/2023)  Transportation Needs: No Transportation Needs (04/26/2023)  Utilities: Not At Risk (04/26/2023)  Alcohol Screen: Low Risk  (12/14/2021)  Depression (PHQ2-9): Medium Risk (08/13/2023)  Financial Resource Strain: Low Risk  (01/16/2022)  Physical Activity: Inactive (12/14/2021)  Social Connections: Unknown (01/29/2022)   Received from [facility]  Recent Concern: Social Connections - Socially Isolated  (12/14/2021)  Stress: No Stress Concern Present (12/14/2021)  Tobacco Use: Medium Risk (10/28/2023)   SDOH Interventions:     Readmission Risk Interventions    05/01/2023   11:11 AM  Readmission Risk Prevention Plan  Transportation Screening Complete  PCP or Specialist Appt within 5-7 Days Complete  Home Care Screening Complete  Medication Review (RN CM) Complete

## 2023-10-31 NOTE — Consult Note (Signed)
[facility] CONSULT NOTE    Date: 10/31/2023                  Patient Name:  Jeanne Tran  MRN: [id number]  DOB: [birth year]  Age / Sex: 89 y.o., female         PCP: Madelin Headings, MD                 Service Requesting Consult: Critical care                 Reason for Consult: Acute kidney injury, chronic kidney disease stage IIIb            History of Present Illness: Patient is a 89 y.o. female with with a PMHx of hyperlipidemia, GERD, hypothyroidism, history of pulmonary nodule, restless leg syndrome, breast cancer, hip fracture 8/24, who was admitted to [facility] on 10/28/2023 for evaluation of altered mental status.  The patient had a very severe illness including C. difficile colitis along with Proteus UTI.  We are now asked to see her for evaluation and management of acute kidney injury in the setting of known chronic kidney disease stage IIIb.  Her baseline creatinine appears to be 1.5.  Patient was treated with ceftriaxone and azithromycin.  She had developed septic shock and was on vasopressin and also developed acute metabolic acidosis and was on bicarbonate infusion.  Unfortunately her renal function has deteriorated.  Creatinine now up to 4.2 with EGFR of 10.  Urine output was only 105 cc over the preceding 24 hours.  It appears that she is able to eat with assistance at the moment.   Medications: Outpatient medications: Medications Prior to Admission  Medication Sig Dispense Refill Last Dose/Taking   acetaminophen (TYLENOL) 325 MG tablet Take 975 mg by mouth in the morning, at noon, and at bedtime.   10/28/2023   calcium carbonate (OS-CAL - DOSED IN MG OF ELEMENTAL CALCIUM) 1250 (500 Ca) MG tablet Take 1 tablet by mouth daily with breakfast.   10/28/2023   levothyroxine (SYNTHROID) 75 MCG tablet Take 75 mcg by mouth daily.   10/28/2023   melatonin 3 MG TABS tablet Take 3 mg by mouth at bedtime as needed (sleep).   Taking As Needed   mirtazapine  (REMERON) 7.5 MG tablet Take 7.5 mg by mouth at bedtime.   10/27/2023   Multiple Vitamins-Minerals (MULTIVITAMIN WITH MINERALS) tablet Take 1 tablet by mouth daily.   10/28/2023   Omega 3 1000 MG CAPS Take 1 capsule by mouth daily.   10/28/2023   senna-docusate (SENOKOT-S) 8.6-50 MG tablet Take 1 tablet by mouth 2 (two) times daily.   10/28/2023   sertraline (ZOLOFT) 100 MG tablet TAKE 1 TABLET(100 MG) BY MOUTH DAILY (Patient taking differently: Take 100 mg by mouth daily.) 90 tablet 1 10/28/2023   vitamin B-12 (CYANOCOBALAMIN) 100 MCG tablet Take 100 mcg by mouth daily.   10/28/2023   polyethylene glycol (MIRALAX / GLYCOLAX) 17 g packet Take 17 g by mouth daily. (Patient not taking: Reported on 08/13/2023)      zolpidem (AMBIEN) 5 MG tablet Take 1 tablet (5 mg total) by mouth at bedtime as needed for up to 7 days for sleep. 7 tablet 0     Current medications: Current Facility-Administered Medications  Medication Dose Route Frequency Provider Last Rate Last Admin   acetaminophen (TYLENOL) tablet 650 mg  650 mg Oral Q4H PRN Dahlia Byes, NP       cefTRIAXone (ROCEPHIN)  2 g in sodium chloride 0.9 % 100 mL IVPB  2 g Intravenous Q24H Assaker, [place], MD   Stopped at 10/30/23 1900   Chlorhexidine Gluconate Cloth 2 % PADS 6 each  6 each Topical Daily Dahlia Byes, NP   6 each at 10/31/23 0501   dexamethasone (DECADRON) tablet 6 mg  6 mg Oral Daily Assaker, [place], MD   6 mg at 10/30/23 1131   docusate sodium (COLACE) capsule 100 mg  100 mg Oral BID PRN Dahlia Byes, NP       feeding supplement (BOOST / RESOURCE BREEZE) liquid 1 Container  1 Container Oral TID BM Janann Colonel, MD   1 Container at 10/30/23 2000   heparin injection 5,000 Units  5,000 Units Subcutaneous Q8H Assaker, [place], MD   5,000 Units at 10/31/23 0508   insulin aspart (novoLOG) injection 0-15 Units  0-15 Units Subcutaneous Q4H Dahlia Byes, NP   2 Units at 10/30/23 2355   levothyroxine (SYNTHROID)  tablet 75 mcg  75 mcg Oral Q0600 Janann Colonel, MD   75 mcg at 10/31/23 0501   metroNIDAZOLE (FLAGYL) IVPB 500 mg  500 mg Intravenous Q8H Assaker, [place], MD 100 mL/hr at 10/31/23 0700 Infusion Verify at 10/31/23 0700   multivitamin with minerals tablet 1 tablet  1 tablet Oral Daily Assaker, [place], MD   1 tablet at 10/30/23 1132   ondansetron (ZOFRAN) injection 4 mg  4 mg Intravenous Q6H PRN Dahlia Byes, NP   4 mg at 10/31/23 0417   polyethylene glycol (MIRALAX / GLYCOLAX) packet 17 g  17 g Oral Daily PRN Dahlia Byes, NP       sertraline (ZOLOFT) tablet 100 mg  100 mg Oral Daily Assaker, [place], MD   100 mg at 10/30/23 1135   sodium chloride flush (NS) 0.9 % injection 10-40 mL  10-40 mL Intracatheter Q12H Dahlia Byes, NP   10 mL at 10/30/23 2200   sodium chloride flush (NS) 0.9 % injection 10-40 mL  10-40 mL Intracatheter PRN Dahlia Byes, NP       vancomycin (VANCOCIN) capsule 500 mg  500 mg Oral Q6H Assaker, [place], MD   500 mg at 10/31/23 0449      Allergies: Allergies  Allergen Reactions   Codeine Other (See Comments)    REACTION: Intolerance w/ pain meds not cough syrup. Codeine makes her bounce off the walls. falling REACTION: Intolerance w/ pain meds not cough syrup. Codeine makes her bounce off the walls.   Aspirin Other (See Comments)    GI issues GI issues      Past Medical History: Past Medical History:  Diagnosis Date   Arthritis    Breast cancer (HCC)    hx of left with radiation and lumpectomy   Diverticulosis    GERD (gastroesophageal reflux disease)    History of colonic polyps    History of ERCP    and sphincterotomy for abnormal lfts and dilated biliary ducts 5/05   Hyperlipidemia    Migraine headache    Pulmonary nodule    RLS (restless legs syndrome)    Wears contact lenses      Past Surgical History: Past Surgical History:  Procedure Laterality Date   ABDOMINAL HYSTERECTOMY     bso   APPENDECTOMY      BREAST LUMPECTOMY  1995   lt lumpsnbx   BREAST LUMPECTOMY WITH RADIOACTIVE SEED LOCALIZATION Left 07/01/2014   Procedure: LEFT BREAST LUMPECTOMY WITH RADIOACTIVE SEED LOCALIZATION;  Surgeon: Avel Peace, MD;  Location: [facility];  Service: General;  Laterality: Left;   CERVICAL SPINE SURGERY  3/03   CHOLECYSTECTOMY  2012   Complete Hysterectomy     CYSTOCELE REPAIR     ERCP     for abnormal lfts and dilated biliary ducts 5/05   gallbladder duct open  2006   HEMORRHOID SURGERY     HYSTEROSCOPY     INTRAMEDULLARY (IM) NAIL INTERTROCHANTERIC Left 04/26/2023   Procedure: INTRAMEDULLARY (IM) NAIL INTERTROCHANTERIC;  Surgeon: Joen Laura, MD;  Location: [facility];  Service: Orthopedics;  Laterality: Left;   LYMPH NODE BIOPSY     Orthoscopic Rt Knee     SPHINCTEROTOMY     for abnormal lfts and dilated biliary ducts 5/05   tonsillectomy       Family History: Family History  Problem Relation Age of Onset   Mitral valve prolapse Son        with afib to have surgery   Esophageal cancer Neg Hx    Liver disease Neg Hx    Stomach cancer Neg Hx      Social History: Social History   Socioeconomic History   Marital status: Widowed    Spouse name: Not on file   Number of children: 3   Years of education: Not on file   Highest education level: Not on file  Occupational History   Occupation: retired  Tobacco Use   Smoking status: Former    Current packs/day: 0.00    Average packs/day: 3.0 packs/day for 25.0 years (75.0 ttl pk-yrs)    Types: Cigarettes    Start date: 02/26/1969    Quit date: 02/26/1994    Years since quitting: 29.6   Smokeless tobacco: Never   Tobacco comments:    quit 24 years   Vaping Use   Vaping status: Never Used  Substance and Sexual Activity   Alcohol use: No   Drug use: No   Sexual activity: Not on file  Other Topics Concern   Not on file  Social History Narrative   Retired; widowed   2 sons   Plays bridge twice monthly    Still drives         Social Drivers of Corporate investment banker Strain: Low Risk  (01/16/2022)   Overall Financial Resource Strain (CARDIA)    Difficulty of Paying Living Expenses: Not hard at all  Food Insecurity: No Food Insecurity (04/26/2023)   Hunger Vital Sign    Worried About Running Out of Food in the Last Year: Never true    Ran Out of Food in the Last Year: Never true  Transportation Needs: No Transportation Needs (04/26/2023)   PRAPARE - Administrator, Civil Service (Medical): No    Lack of Transportation (Non-Medical): No  Physical Activity: Inactive (12/14/2021)   Exercise Vital Sign    Days of Exercise per Week: 0 days    Minutes of Exercise per Session: 0 min  Stress: No Stress Concern Present (12/14/2021)   [employer] of Occupational Health - Occupational Stress Questionnaire    Feeling of Stress : Not at all  Social Connections: Unknown (01/29/2022)   [facility] from [facility], [facility]   Social Network    Social Network: Not on file  Recent Concern: Social Connections - Socially Isolated (12/14/2021)   Social Connection and Isolation Panel [NHANES]    Frequency of Communication with Friends and Family: More than three times a week    Frequency of Social Gatherings with Friends  and Family: More than three times a week    Attends Religious Services: Never    Active Member of Clubs or Organizations: No    Attends Banker Meetings: Never    Marital Status: Widowed  Intimate Partner Violence: Not At Risk (04/26/2023)   Humiliation, Afraid, Rape, and Kick questionnaire    Fear of Current or Ex-Partner: No    Emotionally Abused: No    Physically Abused: No    Sexually Abused: No     Review of Systems: Unable to obtain accurate review of systems due to altered mental status  Vital Signs: Blood pressure (!) 137/91, pulse 89, temperature 98.2 F (36.8 C), temperature source Oral, resp. rate 20, weight 66.3 kg, SpO2  97%.  Weight trends: Filed Weights   10/29/23 0248 10/30/23 0500 10/31/23 0454  Weight: 65 kg 65.9 kg 66.3 kg     Physical Exam: General: No acute distress  Head: Normocephalic, atraumatic. Moist oral mucosal membranes  Eyes: Anicteric  Neck: Supple  Lungs:  Clear to auscultation, normal effort  Heart: S1S2 no rubs  Abdomen:  Soft, nontender, bowel sounds present  Extremities: 1+ peripheral edema.  Neurologic: Awake, alert, following commands  Skin: No acute rash  Access: No hemodialysis access    Lab results: Basic Metabolic Panel: Recent Labs  Lab 10/29/23 0133 10/29/23 0426 10/29/23 1227 10/30/23 0456 10/31/23 0353  NA 137 136 135 132* 131*  K 3.7 4.3 3.8 4.3 4.5  CL 109 105 101 99 97*  CO2 14* 15* 17* 19* 19*  GLUCOSE 257* 163* 86 180* 118*  BUN 42* 47* 53* 61* 73*  CREATININE 1.76* 1.99* 2.28* 3.09* 4.20*  CALCIUM 7.9* 8.0* 8.2* 8.0* 7.9*  MG 1.9 1.9  --  1.5* 2.3  PHOS 7.5* 6.3*  --  4.9* 5.1*    Liver Function Tests: Recent Labs  Lab 10/28/23 1938 10/29/23 1227 10/30/23 0456  AST 110* 1,486* 642*  ALT 57* 908* 599*  ALKPHOS 100 190* 139*  BILITOT 0.6 0.8 0.9  PROT 5.0* 4.6* 5.0*  ALBUMIN 2.5* 2.4* 2.5*   No results for input(s): "LIPASE", "AMYLASE" in the last 168 hours. No results for input(s): "AMMONIA" in the last 168 hours.  CBC: Recent Labs  Lab 10/28/23 1938 10/29/23 0133 10/29/23 0426 10/29/23 1406 10/30/23 0456 10/31/23 0353  WBC 15.2* 31.5* 34.8* 36.5* 31.2* 25.3*  NEUTROABS 6.3  --   --   --   --   --   HGB 10.7* 11.2* 11.6* 11.3* 9.9* 8.9*  HCT 35.4* 36.7 36.9 33.5* 29.3* 25.1*  MCV 109.6* 108.9* 105.1* 98.2 96.4 93.0  PLT 190 133* 154 143* 147* 109*    Cardiac Enzymes: No results for input(s): "CKTOTAL", "CKMB", "CKMBINDEX", "TROPONINI" in the last 168 hours.  BNP: Invalid input(s): "POCBNP"  CBG: Recent Labs  Lab 10/30/23 1643 10/30/23 2028 10/30/23 2350 10/31/23 0400 10/31/23 0758  GLUCAP 106* 137* 129* 85  87    Microbiology: Results for orders placed or performed during the hospital encounter of 10/28/23  Blood Culture (routine x 2)     Status: None (Preliminary result)   Collection Time: 10/28/23  7:38 PM   Specimen: BLOOD RIGHT WRIST  Result Value Ref Range Status   Specimen Description BLOOD RIGHT WRIST  Final   Special Requests   Final    BOTTLES DRAWN AEROBIC AND ANAEROBIC Blood Culture results may not be optimal due to an inadequate volume of blood received in culture bottles   Culture  Final    NO GROWTH 3 DAYS Performed at [facility], [address] [place], [place] [address]    Report Status PENDING  Incomplete  Blood Culture (routine x 2)     Status: None (Preliminary result)   Collection Time: 10/28/23  7:45 PM   Specimen: Right Antecubital; Blood  Result Value Ref Range Status   Specimen Description RIGHT ANTECUBITAL  Final   Special Requests   Final    BOTTLES DRAWN AEROBIC AND ANAEROBIC Blood Culture results may not be optimal due to an inadequate volume of blood received in culture bottles   Culture   Final    NO GROWTH 3 DAYS Performed at [facility], [address], [place], [place] [address]    Report Status PENDING  Incomplete  Urine Culture     Status: Abnormal   Collection Time: 10/28/23  8:20 PM   Specimen: Urine, Random  Result Value Ref Range Status   Specimen Description   Final    URINE, RANDOM Performed at [facility], [address] [place], [place] [address]    Special Requests   Final    NONE Reflexed from [id number] Performed at [facility], [address] [place], [place] [address]    Culture 20,000 COLONIES/mL PROTEUS MIRABILIS (A)  Final   Report Status 10/30/2023 FINAL  Final   Organism ID, Bacteria PROTEUS MIRABILIS (A)  Final      Susceptibility   Proteus mirabilis - MIC*    AMPICILLIN <=2 SENSITIVE Sensitive     CEFAZOLIN <=4 SENSITIVE Sensitive     CEFEPIME <=0.12 SENSITIVE Sensitive      CEFTRIAXONE <=0.25 SENSITIVE Sensitive     CIPROFLOXACIN <=0.25 SENSITIVE Sensitive     GENTAMICIN <=1 SENSITIVE Sensitive     IMIPENEM 1 SENSITIVE Sensitive     NITROFURANTOIN >=512 RESISTANT Resistant     TRIMETH/SULFA <=20 SENSITIVE Sensitive     AMPICILLIN/SULBACTAM <=2 SENSITIVE Sensitive     PIP/TAZO <=4 SENSITIVE Sensitive ug/mL    * 20,000 COLONIES/mL PROTEUS MIRABILIS  Resp panel by RT-PCR (RSV, Flu A&B, Covid) Anterior Nasal Swab     Status: Abnormal   Collection Time: 10/28/23  9:58 PM   Specimen: Anterior Nasal Swab  Result Value Ref Range Status   SARS Coronavirus 2 by RT PCR POSITIVE (A) NEGATIVE Final    Comment: (NOTE) SARS-CoV-2 target nucleic acids are DETECTED.  The SARS-CoV-2 RNA is generally detectable in upper respiratory specimens during the acute phase of infection. Positive results are indicative of the presence of the identified virus, but do not rule out bacterial infection or co-infection with other pathogens not detected by the test. Clinical correlation with patient history and other diagnostic information is necessary to determine patient infection status. The expected result is Negative.  Fact Sheet for Patients: BloggerCourse.com  Fact Sheet for Healthcare Providers: SeriousBroker.it  This test is not yet approved or cleared by the [place] FDA and  has been authorized for detection and/or diagnosis of SARS-CoV-2 by FDA under an Emergency Use Authorization (EUA).  This EUA will remain in effect (meaning this test can be used) for the duration of  the COVID-19 declaration under Section 564(b)(1) of the A ct, 21 U.S.C. section 360bbb-3(b)(1), unless the authorization is terminated or revoked sooner.     Influenza A by PCR NEGATIVE NEGATIVE Final   Influenza B by PCR NEGATIVE NEGATIVE Final    Comment: (NOTE) The Xpert Xpress SARS-CoV-2/FLU/RSV plus assay is intended as  an aid in the  diagnosis of influenza from Nasopharyngeal swab specimens and should not be used as a sole basis for treatment. Nasal washings and aspirates are unacceptable for Xpert Xpress SARS-CoV-2/FLU/RSV testing.  Fact Sheet for Patients: BloggerCourse.com  Fact Sheet for Healthcare Providers: SeriousBroker.it  This test is not yet approved or cleared by the [place] FDA and has been authorized for detection and/or diagnosis of SARS-CoV-2 by FDA under an Emergency Use Authorization (EUA). This EUA will remain in effect (meaning this test can be used) for the duration of the COVID-19 declaration under Section 564(b)(1) of the Act, 21 U.S.C. section 360bbb-3(b)(1), unless the authorization is terminated or revoked.     Resp Syncytial Virus by PCR NEGATIVE NEGATIVE Final    Comment: (NOTE) Fact Sheet for Patients: BloggerCourse.com  Fact Sheet for Healthcare Providers: SeriousBroker.it  This test is not yet approved or cleared by the [place] FDA and has been authorized for detection and/or diagnosis of SARS-CoV-2 by FDA under an Emergency Use Authorization (EUA). This EUA will remain in effect (meaning this test can be used) for the duration of the COVID-19 declaration under Section 564(b)(1) of the Act, 21 U.S.C. section 360bbb-3(b)(1), unless the authorization is terminated or revoked.  Performed at [facility], [address] [place], [place] [address]   MRSA Next Gen by PCR, Nasal     Status: None   Collection Time: 10/28/23 11:16 PM   Specimen: Nasal Mucosa; Nasal Swab  Result Value Ref Range Status   MRSA by PCR Next Gen NOT DETECTED NOT DETECTED Final    Comment: (NOTE) The GeneXpert MRSA Assay (FDA approved for NASAL specimens only), is one component of a comprehensive MRSA colonization surveillance program. It is not intended to diagnose MRSA infection  nor to guide or monitor treatment for MRSA infections. Test performance is not FDA approved in patients less than 5 years old. Performed at [facility], [address] [place], [place] [address]   Stool culture     Status: None (Preliminary result)   Collection Time: 10/28/23 11:51 PM   Specimen: Stool  Result Value Ref Range Status   Salmonella/Shigella Screen PENDING  Incomplete   Campylobacter Culture PENDING  Incomplete   E coli, Shiga toxin Assay Negative Negative Final    Comment: (NOTE) Performed At: [facility] [address] [place], [place] [id number] Jolene Schimke MD HY:[id number]   C Difficile Quick Screen w PCR reflex     Status: Abnormal   Collection Time: 10/29/23 10:47 AM   Specimen: Stool  Result Value Ref Range Status   C Diff antigen POSITIVE (A) NEGATIVE Final    Comment: CRITICAL RESULT CALLED TO, READ BACK BY AND VERIFIED WITH: CORN, C. 1401 10/29/2023 LRL    C Diff toxin POSITIVE (A) NEGATIVE Final    Comment: CRITICAL RESULT CALLED TO, READ BACK BY AND VERIFIED WITH: CORN, C. 1401 10/29/2023 LRL    C Diff interpretation Toxin producing C. difficile detected.  Final    Comment: CRITICAL RESULT CALLED TO, READ BACK BY AND VERIFIED WITH: CORN, C. 1401 10/29/2023 LRL Performed at [facility], [address] [place], [place] [address]   Gastrointestinal Panel by PCR , Stool     Status: None   Collection Time: 10/29/23 12:27 PM   Specimen: Stool  Result Value Ref Range Status   Campylobacter species NOT DETECTED NOT DETECTED Final   Plesimonas shigelloides NOT DETECTED NOT DETECTED Final   Salmonella species NOT DETECTED  NOT DETECTED Final   Yersinia enterocolitica NOT DETECTED NOT DETECTED Final   Vibrio species NOT DETECTED NOT DETECTED Final   Vibrio cholerae NOT DETECTED NOT DETECTED Final   Enteroaggregative E coli (EAEC) NOT DETECTED NOT DETECTED Final   Enteropathogenic E coli (EPEC) NOT DETECTED NOT DETECTED  Final   Enterotoxigenic E coli (ETEC) NOT DETECTED NOT DETECTED Final   Shiga like toxin producing E coli (STEC) NOT DETECTED NOT DETECTED Final   Shigella/Enteroinvasive E coli (EIEC) NOT DETECTED NOT DETECTED Final   Cryptosporidium NOT DETECTED NOT DETECTED Final   Cyclospora cayetanensis NOT DETECTED NOT DETECTED Final   Entamoeba histolytica NOT DETECTED NOT DETECTED Final   Giardia lamblia NOT DETECTED NOT DETECTED Final   Adenovirus F40/41 NOT DETECTED NOT DETECTED Final   Astrovirus NOT DETECTED NOT DETECTED Final   Norovirus GI/GII NOT DETECTED NOT DETECTED Final   Rotavirus A NOT DETECTED NOT DETECTED Final   Sapovirus (I, II, IV, and V) NOT DETECTED NOT DETECTED Final    Comment: Performed at [facility], [address] [place], [place] [address]    Coagulation Studies: Recent Labs    10/28/23 1938 10/29/23 0133 10/29/23 0426  LABPROT 17.1* 25.8* 26.6*  INR 1.4* 2.3* 2.4*    Urinalysis: Recent Labs    10/28/23 2020  COLORURINE RED*  LABSPEC TEST NOT REPORTED DUE TO COLOR INTERFERENCE OF URINE PIGMENT  PHURINE TEST NOT REPORTED DUE TO COLOR INTERFERENCE OF URINE PIGMENT  GLUCOSEU TEST NOT REPORTED DUE TO COLOR INTERFERENCE OF URINE PIGMENT*  HGBUR TEST NOT REPORTED DUE TO COLOR INTERFERENCE OF URINE PIGMENT*  BILIRUBINUR TEST NOT REPORTED DUE TO COLOR INTERFERENCE OF URINE PIGMENT*  KETONESUR TEST NOT REPORTED DUE TO COLOR INTERFERENCE OF URINE PIGMENT*  PROTEINUR TEST NOT REPORTED DUE TO COLOR INTERFERENCE OF URINE PIGMENT*  NITRITE TEST NOT REPORTED DUE TO COLOR INTERFERENCE OF URINE PIGMENT*  LEUKOCYTESUR TEST NOT REPORTED DUE TO COLOR INTERFERENCE OF URINE PIGMENT*      Imaging: No results found.   Assessment & Plan: Pt is a 89 y.o. female with a PMHx of hyperlipidemia, GERD, hypothyroidism, history of pulmonary nodule, restless leg syndrome, breast cancer, hip fracture 8/24, who was admitted to [facility] on 10/28/2023 for evaluation of altered  mental status.  1.  Acute kidney injury/chronic kidney disease stage IIIb baseline creatinine 1.5 with EGFR 33 04/30/2023.  Acute kidney injury now secondary to ischemic ATN from hypotension and underlying sepsis.  Urine output quite low at the moment.  Creatinine up to 4.2.  No urgent indication for dialysis at the moment however this may need to be considered if renal function continues to deteriorate.  Case discussed with critical care.  2.  Acute metabolic acidosis.  Serum bicarbonate has improved up to 19.  Start the patient on sodium bicarb drip at 40 cc/h.  3.Proteus mirabilis UTI.  Continue ceftriaxone.

## 2023-10-31 NOTE — Progress Notes (Signed)
Pt transferred to room 236.  All pt belonging transferred with pt including life alert necklace in pt belonging bag.  Erick Blinks, RN

## 2023-10-31 NOTE — Plan of Care (Signed)
  Problem: Education: Goal: Knowledge of General Education information will improve Description: Including pain rating scale, medication(s)/side effects and non-pharmacologic comfort measures Outcome: Progressing   Problem: Health Behavior/Discharge Planning: Goal: Ability to manage health-related needs will improve Outcome: Progressing   Problem: Clinical Measurements: Goal: Ability to maintain clinical measurements within normal limits will improve Outcome: Progressing Goal: Will remain free from infection Outcome: Progressing Goal: Diagnostic test results will improve Outcome: Progressing Goal: Respiratory complications will improve Outcome: Progressing Goal: Cardiovascular complication will be avoided Outcome: Progressing   Problem: Activity: Goal: Risk for activity intolerance will decrease Outcome: Progressing   Problem: Nutrition: Goal: Adequate nutrition will be maintained Outcome: Progressing   Problem: Coping: Goal: Level of anxiety will decrease Outcome: Progressing   Problem: Elimination: Goal: Will not experience complications related to bowel motility Outcome: Progressing Goal: Will not experience complications related to urinary retention Outcome: Progressing   Problem: Pain Managment: Goal: General experience of comfort will improve and/or be controlled Outcome: Progressing   Problem: Safety: Goal: Ability to remain free from injury will improve Outcome: Progressing   Problem: Skin Integrity: Goal: Risk for impaired skin integrity will decrease Outcome: Progressing   Problem: Education: Goal: Ability to describe self-care measures that may prevent or decrease complications (Diabetes Survival Skills Education) will improve Outcome: Progressing Goal: Individualized Educational Video(s) Outcome: Progressing   Problem: Coping: Goal: Ability to adjust to condition or change in health will improve Outcome: Progressing   Problem: Fluid  Volume: Goal: Ability to maintain a balanced intake and output will improve Outcome: Progressing   Problem: Health Behavior/Discharge Planning: Goal: Ability to identify and utilize available resources and services will improve Outcome: Progressing Goal: Ability to manage health-related needs will improve Outcome: Progressing   Problem: Metabolic: Goal: Ability to maintain appropriate glucose levels will improve Outcome: Progressing   Problem: Nutritional: Goal: Maintenance of adequate nutrition will improve Outcome: Progressing Goal: Progress toward achieving an optimal weight will improve Outcome: Progressing   Problem: Skin Integrity: Goal: Risk for impaired skin integrity will decrease Outcome: Progressing   Problem: Tissue Perfusion: Goal: Adequacy of tissue perfusion will improve Outcome: Progressing   Problem: Education: Goal: Knowledge of risk factors and measures for prevention of condition will improve Outcome: Progressing   Problem: Coping: Goal: Psychosocial and spiritual needs will be supported Outcome: Progressing   Problem: Respiratory: Goal: Will maintain a patent airway Outcome: Progressing Goal: Complications related to the disease process, condition or treatment will be avoided or minimized Outcome: Progressing

## 2023-10-31 NOTE — Consult Note (Signed)
PHARMACY CONSULT NOTE - ELECTROLYTES  Pharmacy Consult for Electrolyte Monitoring and Replacement   Recent Labs: Weight: 66.3 kg (146 lb 2.6 oz) Estimated Creatinine Clearance: 8.7 mL/min (A) (by C-G formula based on SCr of 4.2 mg/dL (H)). Potassium  Date Value  10/31/2023 4.5 mmol/L  04/17/2017 5.0 mEq/L   Magnesium (mg/dL)  Date Value  82/95/6213 2.3   Calcium (mg/dL)  Date Value  08/65/7846 7.9 (L)  04/17/2017 10.1   Albumin (g/dL)  Date Value  96/29/5284 2.5 (L)  04/17/2017 4.0   Phosphorus (mg/dL)  Date Value  13/24/4010 5.1 (H)   Sodium  Date Value  10/31/2023 131 mmol/L (L)  04/17/2017 141 mEq/L   Assessment  Jeanne Tran is a 89 y.o. female presenting with hypotension. PMH significant for GERD, breast cancer. Pharmacy has been consulted to monitor and replace electrolytes.  Diet: NPO MIVF: NA Pertinent medications: N/A  Goal of Therapy: Electrolytes WNL  Plan:  No other replacement indicated at this time Check BMP daily, monitor worsening renal function   Thank you for allowing pharmacy to be a part of this patient's care.  Effie Shy, PharmD Pharmacy Resident  10/31/2023 12:34 PM

## 2023-10-31 NOTE — Evaluation (Addendum)
Occupational Therapy Evaluation Patient Details Name: Jeanne Tran MRN: [id number] DOB: 01-04-1934 Today's Date: 10/31/2023   History of Present Illness   Jeanne Tran is an 89 year old with a PMH of breast Ca, OA, RA, renal insufficiency, RLS, anxiety, migraines, esophageal stricture, hip fx   that presented to the ER from facility on 10/28/2023 with AMS. Admitted with septic shock, likely due to UTI and found to be COVID + and c.diff +     Clinical Impressions Jeanne Tran was seen for OT evaluation this date. Prior to hospital admission, pt was at LTC facility requiring assit for ADLs and w/c t/fs. Pt currently requires MAX A sup>sit, poor sitting balance with L lateral lean. MOD A x2 + HHA sit<>stand, pt tachycardic with activity max HR 167 bpm, resolved with return to supine MAX A x2. Pt would benefit from skilled OT to address noted impairments and functional limitations (see below for any additional details). Upon hospital discharge, recommend OT follow up on return to facility.     If plan is discharge home, recommend the following:   A lot of help with walking and/or transfers;A lot of help with bathing/dressing/bathroom     Functional Status Assessment   Patient has had a recent decline in their functional status and demonstrates the ability to make significant improvements in function in a reasonable and predictable amount of time.     Equipment Recommendations   Other (comment) (defer)     Recommendations for Other Services         Precautions/Restrictions   Precautions Precautions: Fall Restrictions Weight Bearing Restrictions Per Provider Order: No     Mobility Bed Mobility Overal bed mobility: Needs Assistance Bed Mobility: Supine to Sit, Sit to Supine     Supine to sit: Max assist Sit to supine: Max assist, +2 for physical assistance        Transfers Overall transfer level: Needs assistance Equipment used: 2 person hand held  assist Transfers: Sit to/from Stand Sit to Stand: Mod assist, +2 physical assistance, +2 safety/equipment                  Balance Overall balance assessment: Needs assistance Sitting-balance support: Feet supported, Bilateral upper extremity supported Sitting balance-Leahy Scale: Poor   Postural control: Left lateral lean Standing balance support: Bilateral upper extremity supported Standing balance-Leahy Scale: Poor                             ADL either performed or assessed with clinical judgement   ADL Overall ADL's : Needs assistance/impaired                                       General ADL Comments: MAX A Don B socks in sitting. MIN A static sitting balance, SETUP bed level grooming      Pertinent Vitals/Pain Pain Assessment Pain Assessment: Faces Faces Pain Scale: Hurts a little bit Pain Location: denies localized pain but grimaces with activity Pain Descriptors / Indicators: Grimacing Pain Intervention(s): Limited activity within patient's tolerance, Repositioned     Extremity/Trunk Assessment Upper Extremity Assessment Upper Extremity Assessment: Defer to OT evaluation LUE Deficits / Details: 2-/5, achieves ~60* AROM   Lower Extremity Assessment Lower Extremity Assessment: Generalized weakness       Communication Communication Communication: No apparent difficulties   Cognition Arousal: Alert Behavior During  Therapy: WFL for tasks assessed/performed Cognition: No family/caregiver present to determine baseline                               Following commands: Impaired Following commands impaired: Follows one step commands with increased time     Cueing  General Comments   Cueing Techniques: Verbal cues      Exercises     Shoulder Instructions      Home Living Family/patient expects to be discharged to:: Skilled nursing facility                                 Additional  Comments: per chart pt from LTC facility - pt unable to recall name      Prior Functioning/Environment Prior Level of Function : Needs assist             Mobility Comments: reporets assist for w/c t/fs ADLs Comments: assist for all ADLs    OT Problem List: Decreased strength;Decreased range of motion;Decreased activity tolerance;Impaired balance (sitting and/or standing)   OT Treatment/Interventions: Self-care/ADL training;Therapeutic exercise;Energy conservation;DME and/or AE instruction;Therapeutic activities      OT Goals(Current goals can be found in the care plan section)   Acute Rehab OT Goals Patient Stated Goal: to return to facility OT Goal Formulation: With patient Time For Goal Achievement: 11/14/23 Potential to Achieve Goals: Good ADL Goals Pt Will Perform Grooming: with modified independence;sitting Pt Will Perform Lower Body Dressing: with min assist;with caregiver independent in assisting;sit to/from stand Pt Will Transfer to Toilet: with min assist;stand pivot transfer;bedside commode   OT Frequency:  Min 1X/week    Co-evaluation PT/OT/SLP Co-Evaluation/Treatment: Yes Reason for Co-Treatment: To address functional/ADL transfers;For patient/therapist safety PT goals addressed during session: Mobility/safety with mobility OT goals addressed during session: ADL's and self-care;Strengthening/ROM      AM-PAC OT "6 Clicks" Daily Activity     Outcome Measure Help from another person eating meals?: None Help from another person taking care of personal grooming?: A Little Help from another person toileting, which includes using toliet, bedpan, or urinal?: A Lot Help from another person bathing (including washing, rinsing, drying)?: A Lot Help from another person to put on and taking off regular upper body clothing?: A Little Help from another person to put on and taking off regular lower body clothing?: A Lot 6 Click Score: 16   End of Session    Activity  Tolerance: Patient tolerated treatment well Patient left: in bed;with call bell/phone within reach;with bed alarm set  OT Visit Diagnosis: Other abnormalities of gait and mobility (R26.89);Muscle weakness (generalized) (M62.81)                Time: 1610-9604 OT Time Calculation (min): 23 min Charges:  OT General Charges $OT Visit: 1 Visit OT Evaluation $OT Eval Moderate Complexity: 1 Mod  Kathie Dike, M.S. OTR/L  10/31/23, 1:57 PM  ascom [phone]

## 2023-10-31 NOTE — Evaluation (Signed)
Physical Therapy Evaluation Patient Details Name: Jeanne Tran MRN: [id number] DOB: Mar 04, 1934 Today's Date: 10/31/2023  History of Present Illness  Jeanne Tran is an 89 year old with a PMH of breast Ca, OA, RA, renal insufficiency, RLS, anxiety, migraines, esophageal stricture, hip fx   that presented to the ER from facility on 10/28/2023 with AMS. Admitted with septic shock, likely due to UTI and found to be COVID + and c.diff +  Clinical Impression  Pt alert to self only. Denied pain but did grimace with mobility. Per chart and pt she is from a facility, and per chart stand pivot to WC. The pt required maxA to come up into sitting and min-modA due to L lateral lean. Sit <> stand with two person handheld assist, modAx2 but further mobility deferred due to elevated HR (167 max). Did return to 130s with pt resting in bed.  Overall the patient demonstrated deficits (see "PT Problem List") that impede the patient's functional abilities, safety, and mobility and would benefit from skilled PT intervention.          If plan is discharge home, recommend the following: Two people to help with walking and/or transfers;A lot of help with bathing/dressing/bathroom;Help with stairs or ramp for entrance;Direct supervision/assist for medications management;Direct supervision/assist for financial management;Assist for transportation;Assistance with cooking/housework;Supervision due to cognitive status   Can travel by private vehicle        Equipment Recommendations Other (comment) (TBD)  Recommendations for Other Services       Functional Status Assessment Patient has had a recent decline in their functional status and demonstrates the ability to make significant improvements in function in a reasonable and predictable amount of time.     Precautions / Restrictions Precautions Precautions: Fall Restrictions Weight Bearing Restrictions Per Provider Order: No      Mobility  Bed  Mobility Overal bed mobility: Needs Assistance Bed Mobility: Supine to Sit, Sit to Supine     Supine to sit: Max assist Sit to supine: Max assist, +2 for physical assistance        Transfers Overall transfer level: Needs assistance Equipment used: 2 person hand held assist Transfers: Sit to/from Stand Sit to Stand: Mod assist, +2 physical assistance, +2 safety/equipment                Ambulation/Gait                  Stairs            Wheelchair Mobility     Tilt Bed    Modified Rankin (Stroke Patients Only)       Balance Overall balance assessment: Needs assistance Sitting-balance support: Feet supported, Bilateral upper extremity supported Sitting balance-Leahy Scale: Poor   Postural control: Left lateral lean Standing balance support: Bilateral upper extremity supported Standing balance-Leahy Scale: Poor                               Pertinent Vitals/Pain Pain Assessment Pain Assessment: Faces Faces Pain Scale: Hurts a little bit Pain Location: denies localized pain but grimaces with activity Pain Descriptors / Indicators: Grimacing Pain Intervention(s): Limited activity within patient's tolerance, Repositioned    Home Living Family/patient expects to be discharged to:: Skilled nursing facility                   Additional Comments: per chart pt from LTC facility - pt unable to recall name  Prior Function Prior Level of Function : Needs assist             Mobility Comments: reporets assist for w/c t/fs ADLs Comments: assist for all ADLs     Extremity/Trunk Assessment   Upper Extremity Assessment Upper Extremity Assessment: Defer to OT evaluation LUE Deficits / Details: 2-/5, achieves ~60* AROM    Lower Extremity Assessment Lower Extremity Assessment: Generalized weakness       Communication   Communication Communication: No apparent difficulties    Cognition Arousal: Alert Behavior During  Therapy: WFL for tasks assessed/performed   PT - Cognitive impairments: History of cognitive impairments                         Following commands: Impaired Following commands impaired: Follows one step commands with increased time     Cueing Cueing Techniques: Verbal cues     General Comments General comments (skin integrity, edema, etc.): HR elevated with all mobility attempts    Exercises     Assessment/Plan    PT Assessment Patient needs continued PT services  PT Problem List Decreased strength;Decreased activity tolerance;Decreased balance;Decreased mobility       PT Treatment Interventions DME instruction;Balance training;Gait training;Neuromuscular re-education;Stair training;Functional mobility training;Patient/family education;Therapeutic activities;Therapeutic exercise    PT Goals (Current goals can be found in the Care Plan section)  Acute Rehab PT Goals PT Goal Formulation: Patient unable to participate in goal setting Time For Goal Achievement: 11/14/23 Potential to Achieve Goals: Fair    Frequency Min 1X/week     Co-evaluation PT/OT/SLP Co-Evaluation/Treatment: Yes Reason for Co-Treatment: To address functional/ADL transfers;For patient/therapist safety PT goals addressed during session: Mobility/safety with mobility OT goals addressed during session: ADL's and self-care;Strengthening/ROM       AM-PAC PT "6 Clicks" Mobility  Outcome Measure Help needed turning from your back to your side while in a flat bed without using bedrails?: A Lot Help needed moving from lying on your back to sitting on the side of a flat bed without using bedrails?: A Lot Help needed moving to and from a bed to a chair (including a wheelchair)?: A Lot Help needed standing up from a chair using your arms (e.g., wheelchair or bedside chair)?: A Lot Help needed to walk in hospital room?: Total Help needed climbing 3-5 steps with a railing? : Total 6 Click Score: 10     End of Session   Activity Tolerance: Patient limited by fatigue;Other (comment) (limited by HR) Patient left: in bed;with call bell/phone within reach;with bed alarm set Nurse Communication: Mobility status;Other (comment) (HR) PT Visit Diagnosis: Other abnormalities of gait and mobility (R26.89);Difficulty in walking, not elsewhere classified (R26.2);Muscle weakness (generalized) (M62.81)    Time: 1027-2536 PT Time Calculation (min) (ACUTE ONLY): 16 min   Charges:   PT Evaluation $PT Eval Moderate Complexity: 1 Mod   PT General Charges $$ ACUTE PT VISIT: 1 Visit         Olga Coaster PT, DPT 2:02 PM,10/31/23

## 2023-11-01 ENCOUNTER — Other Ambulatory Visit: Payer: Self-pay

## 2023-11-01 DIAGNOSIS — E039 Hypothyroidism, unspecified: Secondary | ICD-10-CM

## 2023-11-01 DIAGNOSIS — A0472 Enterocolitis due to Clostridium difficile, not specified as recurrent: Secondary | ICD-10-CM | POA: Diagnosis not present

## 2023-11-01 DIAGNOSIS — A419 Sepsis, unspecified organism: Secondary | ICD-10-CM | POA: Diagnosis not present

## 2023-11-01 DIAGNOSIS — E872 Acidosis, unspecified: Secondary | ICD-10-CM | POA: Diagnosis not present

## 2023-11-01 DIAGNOSIS — R6521 Severe sepsis with septic shock: Secondary | ICD-10-CM | POA: Diagnosis not present

## 2023-11-01 LAB — LACTIC ACID, PLASMA
Lactic Acid, Venous: 1.7 mmol/L (ref 0.5–1.9)
Lactic Acid, Venous: 1.8 mmol/L (ref 0.5–1.9)

## 2023-11-01 LAB — BASIC METABOLIC PANEL
Anion gap: 13 (ref 5–15)
BUN: 83 mg/dL — ABNORMAL HIGH (ref 8–23)
CO2: 22 mmol/L (ref 22–32)
Calcium: 8.2 mg/dL — ABNORMAL LOW (ref 8.9–10.3)
Chloride: 99 mmol/L (ref 98–111)
Creatinine, Ser: 5.34 mg/dL — ABNORMAL HIGH (ref 0.44–1.00)
GFR, Estimated: 7 mL/min — ABNORMAL LOW (ref >60)
Glucose, Bld: 131 mg/dL — ABNORMAL HIGH (ref 70–99)
Potassium: 3.7 mmol/L (ref 3.5–5.1)
Sodium: 134 mmol/L — ABNORMAL LOW (ref 135–145)

## 2023-11-01 LAB — GLUCOSE, CAPILLARY
Glucose-Capillary: 108 mg/dL — ABNORMAL HIGH (ref 70–99)
Glucose-Capillary: 111 mg/dL — ABNORMAL HIGH (ref 70–99)
Glucose-Capillary: 120 mg/dL — ABNORMAL HIGH (ref 70–99)
Glucose-Capillary: 124 mg/dL — ABNORMAL HIGH (ref 70–99)
Glucose-Capillary: 146 mg/dL — ABNORMAL HIGH (ref 70–99)
Glucose-Capillary: 151 mg/dL — ABNORMAL HIGH (ref 70–99)
Glucose-Capillary: 165 mg/dL — ABNORMAL HIGH (ref 70–99)

## 2023-11-01 LAB — CBC
HCT: 26 % — ABNORMAL LOW (ref 36.0–46.0)
Hemoglobin: 9 g/dL — ABNORMAL LOW (ref 12.0–15.0)
MCH: 32.7 pg (ref 26.0–34.0)
MCHC: 34.6 g/dL (ref 30.0–36.0)
MCV: 94.5 fL (ref 80.0–100.0)
Platelets: 122 10*3/uL — ABNORMAL LOW (ref 150–400)
RBC: 2.75 MIL/uL — ABNORMAL LOW (ref 3.87–5.11)
RDW: 14.1 % (ref 11.5–15.5)
WBC: 25.3 10*3/uL — ABNORMAL HIGH (ref 4.0–10.5)
nRBC: 0.1 % (ref 0.0–0.2)

## 2023-11-01 LAB — PHOSPHORUS: Phosphorus: 5.8 mg/dL — ABNORMAL HIGH (ref 2.5–4.6)

## 2023-11-01 LAB — MAGNESIUM: Magnesium: 2.2 mg/dL (ref 1.7–2.4)

## 2023-11-01 NOTE — Plan of Care (Signed)

## 2023-11-01 NOTE — Plan of Care (Signed)
?  Problem: Education: ?Goal: Knowledge of General Education information will improve ?Description: Including pain rating scale, medication(s)/side effects and non-pharmacologic comfort measures ?Outcome: Progressing ?  ?Problem: Health Behavior/Discharge Planning: ?Goal: Ability to manage health-related needs will improve ?Outcome: Progressing ?  ?Problem: Clinical Measurements: ?Goal: Ability to maintain clinical measurements within normal limits will improve ?Outcome: Progressing ?Goal: Will remain free from infection ?Outcome: Progressing ?Goal: Diagnostic test results will improve ?Outcome: Progressing ?Goal: Cardiovascular complication will be avoided ?Outcome: Progressing ?  ?Problem: Nutrition: ?Goal: Adequate nutrition will be maintained ?Outcome: Progressing ?  ?

## 2023-11-01 NOTE — Progress Notes (Signed)
[facility]  ROUNDING NOTE   Subjective:   Patient seen resting quietly in bed Breakfast tray at bedside Alert and oriented No family present Ill-appearing 3 L nasal cannula  Creatinine 5.34 Urine output 325 mL recorded on day shift yesterday  During visit, this provider set up patient and meal tray for consumption  Objective:  Vital signs in last 24 hours:  Temp:  [97.8 F (36.6 C)-98.3 F (36.8 C)] 97.8 F (36.6 C) (02/15 1208) Pulse Rate:  [90-100] 93 (02/15 1208) Resp:  [16-20] 20 (02/15 1208) BP: (106-158)/(76-97) 136/89 (02/15 1208) SpO2:  [93 %-99 %] 93 % (02/15 1208) Weight:  [74.3 kg] 74.3 kg (02/15 0735)  Weight change:  Filed Weights   10/30/23 0500 10/31/23 0454 11/01/23 0735  Weight: 65.9 kg 66.3 kg 74.3 kg    Intake/Output: I/O last 3 completed shifts: In: 324.5 [P.O.:120; I.V.:2.7; IV Piggyback:201.8] Out: 1140 [Urine:415; Stool:725]   Intake/Output this shift:  Total I/O In: -  Out: 450 [Urine:450]  Physical Exam: General: Ill-appearing  Head: Normocephalic, atraumatic. Moist oral mucosal membranes  Eyes: Anicteric  Lungs:  Clear to auscultation, NCO2  Heart: Regular rate and rhythm  Abdomen:  Soft, nontender,   Extremities: Trace to 1+ peripheral edema.  Neurologic: Alert and oriented, moving all four extremities  Skin: No lesions  Access: None    Basic Metabolic Panel: Recent Labs  Lab 10/29/23 0133 10/29/23 0426 10/29/23 1227 10/30/23 0456 10/31/23 0353 11/01/23 0638  NA 137 136 135 132* 131* 134*  K 3.7 4.3 3.8 4.3 4.5 3.7  CL 109 105 101 99 97* 99  CO2 14* 15* 17* 19* 19* 22  GLUCOSE 257* 163* 86 180* 118* 131*  BUN 42* 47* 53* 61* 73* 83*  CREATININE 1.76* 1.99* 2.28* 3.09* 4.20* 5.34*  CALCIUM 7.9* 8.0* 8.2* 8.0* 7.9* 8.2*  MG 1.9 1.9  --  1.5* 2.3 2.2  PHOS 7.5* 6.3*  --  4.9* 5.1* 5.8*    Liver Function Tests: Recent Labs  Lab 10/28/23 1938 10/29/23 1227 10/30/23 0456  AST 110* 1,486* 642*   ALT 57* 908* 599*  ALKPHOS 100 190* 139*  BILITOT 0.6 0.8 0.9  PROT 5.0* 4.6* 5.0*  ALBUMIN 2.5* 2.4* 2.5*   No results for input(s): "LIPASE", "AMYLASE" in the last 168 hours. No results for input(s): "AMMONIA" in the last 168 hours.  CBC: Recent Labs  Lab 10/28/23 1938 10/29/23 0133 10/29/23 0426 10/29/23 1406 10/30/23 0456 10/31/23 0353 11/01/23 0638  WBC 15.2*   < > 34.8* 36.5* 31.2* 25.3* 25.3*  NEUTROABS 6.3  --   --   --   --   --   --   HGB 10.7*   < > 11.6* 11.3* 9.9* 8.9* 9.0*  HCT 35.4*   < > 36.9 33.5* 29.3* 25.1* 26.0*  MCV 109.6*   < > 105.1* 98.2 96.4 93.0 94.5  PLT 190   < > 154 143* 147* 109* 122*   < > = values in this interval not displayed.    Cardiac Enzymes: No results for input(s): "CKTOTAL", "CKMB", "CKMBINDEX", "TROPONINI" in the last 168 hours.  BNP: Invalid input(s): "POCBNP"  CBG: Recent Labs  Lab 10/31/23 1958 11/01/23 0129 11/01/23 0441 11/01/23 0738 11/01/23 1206  GLUCAP 124* 165* 111* 120* 151*    Microbiology: Results for orders placed or performed during the hospital encounter of 10/28/23  Blood Culture (routine x 2)     Status: None (Preliminary result)   Collection Time: 10/28/23  7:38 PM   Specimen: BLOOD RIGHT WRIST  Result Value Ref Range Status   Specimen Description BLOOD RIGHT WRIST  Final   Special Requests   Final    BOTTLES DRAWN AEROBIC AND ANAEROBIC Blood Culture results may not be optimal due to an inadequate volume of blood received in culture bottles   Culture   Final    NO GROWTH 4 DAYS Performed at [facility], [address], [place], [place] [address]    Report Status PENDING  Incomplete  Blood Culture (routine x 2)     Status: None (Preliminary result)   Collection Time: 10/28/23  7:45 PM   Specimen: Right Antecubital; Blood  Result Value Ref Range Status   Specimen Description RIGHT ANTECUBITAL  Final   Special Requests   Final    BOTTLES DRAWN AEROBIC AND ANAEROBIC Blood Culture  results may not be optimal due to an inadequate volume of blood received in culture bottles   Culture   Final    NO GROWTH 4 DAYS Performed at [facility], [address] [place], [place] [address]    Report Status PENDING  Incomplete  Urine Culture     Status: Abnormal   Collection Time: 10/28/23  8:20 PM   Specimen: Urine, Random  Result Value Ref Range Status   Specimen Description   Final    URINE, RANDOM Performed at [facility], [address] [place], [place] [address]    Special Requests   Final    NONE Reflexed from [phone] Performed at [facility], [address] [place], [place] [address]    Culture 20,000 COLONIES/mL PROTEUS MIRABILIS (A)  Final   Report Status 10/30/2023 FINAL  Final   Organism ID, Bacteria PROTEUS MIRABILIS (A)  Final      Susceptibility   Proteus mirabilis - MIC*    AMPICILLIN <=2 SENSITIVE Sensitive     CEFAZOLIN <=4 SENSITIVE Sensitive     CEFEPIME <=0.12 SENSITIVE Sensitive     CEFTRIAXONE <=0.25 SENSITIVE Sensitive     CIPROFLOXACIN <=0.25 SENSITIVE Sensitive     GENTAMICIN <=1 SENSITIVE Sensitive     IMIPENEM 1 SENSITIVE Sensitive     NITROFURANTOIN >=512 RESISTANT Resistant     TRIMETH/SULFA <=20 SENSITIVE Sensitive     AMPICILLIN/SULBACTAM <=2 SENSITIVE Sensitive     PIP/TAZO <=4 SENSITIVE Sensitive ug/mL    * 20,000 COLONIES/mL PROTEUS MIRABILIS  Resp panel by RT-PCR (RSV, Flu A&B, Covid) Anterior Nasal Swab     Status: Abnormal   Collection Time: 10/28/23  9:58 PM   Specimen: Anterior Nasal Swab  Result Value Ref Range Status   SARS Coronavirus 2 by RT PCR POSITIVE (A) NEGATIVE Final    Comment: (NOTE) SARS-CoV-2 target nucleic acids are DETECTED.  The SARS-CoV-2 RNA is generally detectable in upper respiratory specimens during the acute phase of infection. Positive results are indicative of the presence of the identified virus, but do not rule out bacterial infection or co-infection with other  pathogens not detected by the test. Clinical correlation with patient history and other diagnostic information is necessary to determine patient infection status. The expected result is Negative.  Fact Sheet for Patients: BloggerCourse.com  Fact Sheet for Healthcare Providers: SeriousBroker.it  This test is not yet approved or cleared by the [place] FDA and  has been authorized for detection and/or diagnosis of SARS-CoV-2 by FDA under an Emergency Use Authorization (EUA).  This EUA will remain in effect (meaning this test can be  used) for the duration of  the COVID-19 declaration under Section 564(b)(1) of the A ct, 21 U.S.C. section 360bbb-3(b)(1), unless the authorization is terminated or revoked sooner.     Influenza A by PCR NEGATIVE NEGATIVE Final   Influenza B by PCR NEGATIVE NEGATIVE Final    Comment: (NOTE) The Xpert Xpress SARS-CoV-2/FLU/RSV plus assay is intended as an aid in the diagnosis of influenza from Nasopharyngeal swab specimens and should not be used as a sole basis for treatment. Nasal washings and aspirates are unacceptable for Xpert Xpress SARS-CoV-2/FLU/RSV testing.  Fact Sheet for Patients: BloggerCourse.com  Fact Sheet for Healthcare Providers: SeriousBroker.it  This test is not yet approved or cleared by the [place] FDA and has been authorized for detection and/or diagnosis of SARS-CoV-2 by FDA under an Emergency Use Authorization (EUA). This EUA will remain in effect (meaning this test can be used) for the duration of the COVID-19 declaration under Section 564(b)(1) of the Act, 21 U.S.C. section 360bbb-3(b)(1), unless the authorization is terminated or revoked.     Resp Syncytial Virus by PCR NEGATIVE NEGATIVE Final    Comment: (NOTE) Fact Sheet for Patients: BloggerCourse.com  Fact Sheet for Healthcare  Providers: SeriousBroker.it  This test is not yet approved or cleared by the [place] FDA and has been authorized for detection and/or diagnosis of SARS-CoV-2 by FDA under an Emergency Use Authorization (EUA). This EUA will remain in effect (meaning this test can be used) for the duration of the COVID-19 declaration under Section 564(b)(1) of the Act, 21 U.S.C. section 360bbb-3(b)(1), unless the authorization is terminated or revoked.  Performed at [facility], [address] [place], [place] [address]   MRSA Next Gen by PCR, Nasal     Status: None   Collection Time: 10/28/23 11:16 PM   Specimen: Nasal Mucosa; Nasal Swab  Result Value Ref Range Status   MRSA by PCR Next Gen NOT DETECTED NOT DETECTED Final    Comment: (NOTE) The GeneXpert MRSA Assay (FDA approved for NASAL specimens only), is one component of a comprehensive MRSA colonization surveillance program. It is not intended to diagnose MRSA infection nor to guide or monitor treatment for MRSA infections. Test performance is not FDA approved in patients less than 63 years old. Performed at [facility], [address] [place], [place] [address]   Stool culture     Status: None (Preliminary result)   Collection Time: 10/28/23 11:51 PM   Specimen: Stool  Result Value Ref Range Status   Salmonella/Shigella Screen Final report  Final   Campylobacter Culture PENDING  Incomplete   E coli, Shiga toxin Assay Negative Negative Final    Comment: (NOTE) Performed At: [facility] [address] [place], [place] [id number] Jolene Schimke MD GE:[id number]   STOOL CULTURE REFLEX - RSASHR     Status: None   Collection Time: 10/28/23 11:51 PM  Result Value Ref Range Status   Stool Culture result 1 (RSASHR) Comment  Final    Comment: (NOTE) No Salmonella or Shigella recovered. Performed At: [facility] [address] [place], [place] [id number] Jolene Schimke  MD UY:[id number]   C Difficile Quick Screen w PCR reflex     Status: Abnormal   Collection Time: 10/29/23 10:47 AM   Specimen: Stool  Result Value Ref Range Status   C Diff antigen POSITIVE (A) NEGATIVE Final    Comment: CRITICAL RESULT CALLED TO, READ BACK BY AND VERIFIED WITH: CORN, C. 1401 10/29/2023 LRL    C  Diff toxin POSITIVE (A) NEGATIVE Final    Comment: CRITICAL RESULT CALLED TO, READ BACK BY AND VERIFIED WITH: CORN, C. 1401 10/29/2023 LRL    C Diff interpretation Toxin producing C. difficile detected.  Final    Comment: CRITICAL RESULT CALLED TO, READ BACK BY AND VERIFIED WITH: CORN, C. 1401 10/29/2023 LRL Performed at [facility], [address] [place], [place] [address]   Gastrointestinal Panel by PCR , Stool     Status: None   Collection Time: 10/29/23 12:27 PM   Specimen: Stool  Result Value Ref Range Status   Campylobacter species NOT DETECTED NOT DETECTED Final   Plesimonas shigelloides NOT DETECTED NOT DETECTED Final   Salmonella species NOT DETECTED NOT DETECTED Final   Yersinia enterocolitica NOT DETECTED NOT DETECTED Final   Vibrio species NOT DETECTED NOT DETECTED Final   Vibrio cholerae NOT DETECTED NOT DETECTED Final   Enteroaggregative E coli (EAEC) NOT DETECTED NOT DETECTED Final   Enteropathogenic E coli (EPEC) NOT DETECTED NOT DETECTED Final   Enterotoxigenic E coli (ETEC) NOT DETECTED NOT DETECTED Final   Shiga like toxin producing E coli (STEC) NOT DETECTED NOT DETECTED Final   Shigella/Enteroinvasive E coli (EIEC) NOT DETECTED NOT DETECTED Final   Cryptosporidium NOT DETECTED NOT DETECTED Final   Cyclospora cayetanensis NOT DETECTED NOT DETECTED Final   Entamoeba histolytica NOT DETECTED NOT DETECTED Final   Giardia lamblia NOT DETECTED NOT DETECTED Final   Adenovirus F40/41 NOT DETECTED NOT DETECTED Final   Astrovirus NOT DETECTED NOT DETECTED Final   Norovirus GI/GII NOT DETECTED NOT DETECTED Final   Rotavirus A NOT DETECTED NOT  DETECTED Final   Sapovirus (I, II, IV, and V) NOT DETECTED NOT DETECTED Final    Comment: Performed at [facility], [address] [place], [place] [address]    Coagulation Studies: No results for input(s): "LABPROT", "INR" in the last 72 hours.  Urinalysis: No results for input(s): "COLORURINE", "LABSPEC", "PHURINE", "GLUCOSEU", "HGBUR", "BILIRUBINUR", "KETONESUR", "PROTEINUR", "UROBILINOGEN", "NITRITE", "LEUKOCYTESUR" in the last 72 hours.  Invalid input(s): "APPERANCEUR"    Imaging: US RENAL Result Date: 10/31/2023 CLINICAL DATA:  [id number] AKI (acute kidney injury) (HCC) [phone], inpatient EXAM: RENAL / URINARY TRACT ULTRASOUND COMPLETE COMPARISON:  10/28/2023 CT chest, abdomen and pelvis FINDINGS: Right Kidney: Renal measurements: 10.0 x 3.6 x 3.6 cm = volume: 69 mL. Mildly echogenic right renal parenchyma, normal thickness. No hydronephrosis. No renal masses. Left Kidney: Renal measurements: 9.8 x 4.7 x 4.2 cm = volume: 102 mL. Mildly echogenic left renal parenchyma, normal thickness. No hydronephrosis. No renal masses. Bladder: Bladder relatively collapsed by indwelling Foley catheter. Other: None. IMPRESSION: 1. No hydronephrosis. 2. Mildly echogenic renal parenchyma bilaterally, compatible with nonspecific acute renal parenchymal disease. Electronically Signed   By: Delbert Phenix M.D.   On: 10/31/2023 14:42     Medications:    cefTRIAXone (ROCEPHIN)  IV 2 g (10/31/23 1744)   metronidazole 500 mg (11/01/23 0620)   sodium bicarbonate 150 mEq in dextrose 5 % 1,150 mL infusion 40 mL/hr at 10/31/23 1215    Chlorhexidine Gluconate Cloth  6 each Topical Daily   dexamethasone  6 mg Oral Daily   feeding supplement  1 Container Oral TID BM   heparin injection (subcutaneous)  5,000 Units Subcutaneous Q8H   insulin aspart  0-15 Units Subcutaneous Q4H   levothyroxine  75 mcg Oral Q0600   multivitamin with minerals  1 tablet Oral Daily   sertraline  100 mg Oral Daily  sodium  chloride flush  10-40 mL Intracatheter Q12H   vancomycin  500 mg Oral Q6H   acetaminophen, docusate sodium, ondansetron (ZOFRAN) IV, polyethylene glycol, sodium chloride flush  Assessment/ Plan:  Jeanne Tran is a 89 y.o.  female  with a PMHx of hyperlipidemia, GERD, hypothyroidism, history of pulmonary nodule, restless leg syndrome, breast cancer, hip fracture 8/24, who was admitted to [facility] on 10/28/2023 for Septic shock (HCC) [A41.9, R65.21]   Acute kidney injury/chronic kidney disease stage IIIb baseline creatinine 1.5 with EGFR 33 04/30/2023. Acute kidney injury now secondary to ischemic ATN from hypotension and underlying sepsis.  Creatinine continues to rise, 5.34 today.  Little urine output recorded, 325 mL on day shift yesterday.  Denies uremic symptoms.  No acute indication for dialysis however patient is agreeable if needed.  Appreciate palliative care following and discussing goals of care.  Encourage nursing to record all urine output.  Will continue to monitor daily.  Lab Results  Component Value Date   CREATININE 5.34 (H) 11/01/2023   CREATININE 4.20 (H) 10/31/2023   CREATININE 3.09 (H) 10/30/2023    Intake/Output Summary (Last 24 hours) at 11/01/2023 1316 Last data filed at 11/01/2023 1205 Gross per 24 hour  Intake --  Output 775 ml  Net -775 ml    2.  Acute metabolic acidosis.  Serum bicarbonate has with infusion.  Can continue sodium bicarb drip for today.   3.Proteus mirabilis UTI.  Continue ceftriaxone.       LOS: 4 Electa Sterry 2/15/20251:16 PM

## 2023-11-01 NOTE — Progress Notes (Signed)
1      PROGRESS NOTE    GENELLA BAS  ZOX:[id number] DOB: June 19, 1934 DOA: 10/28/2023 PCP: Madelin Headings, MD    Brief Narrative:   89 year old with a PMH significant for HLD, GERD, CKD stage 3, Hypothyroidism, Pulmonary nodule, RLS, Breast cancer, Hip fracture 04/2023 that presented to the ER 10/28/2023 with AMS.  She was admitted to ICU for septic shock  2/11: Patient admitted with septic shock, likely due to UTI. Levophed/vasopressin. Bicarbonate drip/bolus's. Antibiotics, cultures, stress dose steroids.  02/12: C.diff +. Started on Flagyl and PO vanco. Not a candidate for Surgery.  02/13: Hemodynamically improving. Mental status improved.  2/14: Transfer out of ICU 2/15: TRH picked up.  Kidney function continues to get worse  Assessment & Plan:   Principal Problem:   Septic shock (HCC) Active Problems:   C. difficile colitis   # Shock likely distributive/septic due to UTI in the setting of (Resolved) -urine culture growing Proteus Mirabilis.  Continue IV Rocephin, Flagyl. #C.diff colitis (Fulminant C.diff) not a candidate for surgical intervention.  On oral vancomycin.  Diarrhea improving # Lactic acidosis with downtrending lactate currently at 4.0 --> 3.4. secondary to the above.  Will order lactic acid trends # AKI with CKD stage IIIb creatinine 2.2 mg/dL from a baseline of 1.5 mg/dL likely ATN in the setting of sepsis.Worsening currently at 5.34 mg/dl.  Not a acute indication for dialysis yet as patient is making urine and not quite uremic.  Family still debating and may opt for comfort care/hospice # Hepatitis with elevated AST at 1400 and elevated ALT at 900 likely in the setting of shock liver downtrending.  Monitor # COVID-19 positive on 10/28/2023  # Hypothyroidism: Continue levothyroxine # Goals of care: Palliative care consult, son shared that patient has had significant clinical and functional decline last 6 months.  She is not ambulating much at [facility] since  her hip fracture.  He is planning on having discussion with remaining family numbers to see if they would agree for keeping her comfortable with palliative/hospice upon discharge.  Overall poor prognosis  DVT prophylaxis:  heparin injection 5,000 Units Start: 10/30/23 1545 SCDs Start: 10/28/23 2318     Code Status: DNR Family Communication: Updated son/Kirk over phone Disposition Plan: Possible discharge in next 2 to 3 days depending on clinical condition   Consultants:  Nephrology Palliative care   Antimicrobials:  Rocephin Flagyl Oral vancomycin   Subjective:  Pleasantly confused.  Struggling to take vancomycin capsules  Objective: Vitals:   11/01/23 0100 11/01/23 0400 11/01/23 0735 11/01/23 1208  BP: (!) 107/97 (!) 144/94 125/89 136/89  Pulse: 95 90 96 93  Resp:   17 20  Temp: 98 F (36.7 C) 98 F (36.7 C) 98.2 F (36.8 C) 97.8 F (36.6 C)  TempSrc: Oral Oral Oral Oral  SpO2: 97% 99% 93% 93%  Weight:   74.3 kg     Intake/Output Summary (Last 24 hours) at 11/01/2023 1407 Last data filed at 11/01/2023 1205 Gross per 24 hour  Intake --  Output 775 ml  Net -775 ml   Filed Weights   10/30/23 0500 10/31/23 0454 11/01/23 0735  Weight: 65.9 kg 66.3 kg 74.3 kg    Examination:  General exam: Cachectic looking, chronically ill and frail appearing Respiratory system: Clear to auscultation. Respiratory effort normal. Cardiovascular system: S1 & S2 heard, RRR. No JVD, murmurs, trace pedal edema Gastrointestinal system: Abdomen is soft, benign Central nervous system: Alert and awake, pleasantly confused.  No focal neurological deficits. Extremities: Symmetric 5 x 5 power. Skin: No rashes, lesions or ulcers Psychiatry: Pleasantly confused    Data Reviewed: I have personally reviewed following labs and imaging studies  CBC: Recent Labs  Lab 10/28/23 1938 10/29/23 0133 10/29/23 0426 10/29/23 1406 10/30/23 0456 10/31/23 0353 11/01/23 0638  WBC 15.2*   < >  34.8* 36.5* 31.2* 25.3* 25.3*  NEUTROABS 6.3  --   --   --   --   --   --   HGB 10.7*   < > 11.6* 11.3* 9.9* 8.9* 9.0*  HCT 35.4*   < > 36.9 33.5* 29.3* 25.1* 26.0*  MCV 109.6*   < > 105.1* 98.2 96.4 93.0 94.5  PLT 190   < > 154 143* 147* 109* 122*   < > = values in this interval not displayed.   Basic Metabolic Panel: Recent Labs  Lab 10/29/23 0133 10/29/23 0426 10/29/23 1227 10/30/23 0456 10/31/23 0353 11/01/23 0638  NA 137 136 135 132* 131* 134*  K 3.7 4.3 3.8 4.3 4.5 3.7  CL 109 105 101 99 97* 99  CO2 14* 15* 17* 19* 19* 22  GLUCOSE 257* 163* 86 180* 118* 131*  BUN 42* 47* 53* 61* 73* 83*  CREATININE 1.76* 1.99* 2.28* 3.09* 4.20* 5.34*  CALCIUM 7.9* 8.0* 8.2* 8.0* 7.9* 8.2*  MG 1.9 1.9  --  1.5* 2.3 2.2  PHOS 7.5* 6.3*  --  4.9* 5.1* 5.8*   GFR: Estimated Creatinine Clearance: 7.2 mL/min (A) (by C-G formula based on SCr of 5.34 mg/dL (H)). Liver Function Tests: Recent Labs  Lab 10/28/23 1938 10/29/23 1227 10/30/23 0456  AST 110* 1,486* 642*  ALT 57* 908* 599*  ALKPHOS 100 190* 139*  BILITOT 0.6 0.8 0.9  PROT 5.0* 4.6* 5.0*  ALBUMIN 2.5* 2.4* 2.5*   No results for input(s): "LIPASE", "AMYLASE" in the last 168 hours. No results for input(s): "AMMONIA" in the last 168 hours. Coagulation Profile: Recent Labs  Lab 10/28/23 1938 10/29/23 0133 10/29/23 0426  INR 1.4* 2.3* 2.4*    CBG: Recent Labs  Lab 10/31/23 1958 11/01/23 0129 11/01/23 0441 11/01/23 0738 11/01/23 1206  GLUCAP 124* 165* 111* 120* 151*    Sepsis Labs: Recent Labs  Lab 10/29/23 0133 10/29/23 0427 10/29/23 0808 10/29/23 1156 10/29/23 1454 10/30/23 0456  PROCALCITON 0.84  --   --   --   --   --   LATICACIDVEN 5.1*   < > 4.0* 4.2* 3.4* 3.4*   < > = values in this interval not displayed.    Recent Results (from the past 240 hours)  Blood Culture (routine x 2)     Status: None (Preliminary result)   Collection Time: 10/28/23  7:38 PM   Specimen: BLOOD RIGHT WRIST  Result Value  Ref Range Status   Specimen Description BLOOD RIGHT WRIST  Final   Special Requests   Final    BOTTLES DRAWN AEROBIC AND ANAEROBIC Blood Culture results may not be optimal due to an inadequate volume of blood received in culture bottles   Culture   Final    NO GROWTH 4 DAYS Performed at [facility], [address] [place], [place] [address]    Report Status PENDING  Incomplete  Blood Culture (routine x 2)     Status: None (Preliminary result)   Collection Time: 10/28/23  7:45 PM   Specimen: Right Antecubital; Blood  Result Value Ref Range Status   Specimen Description RIGHT ANTECUBITAL  Final   Special Requests   Final    BOTTLES DRAWN AEROBIC AND ANAEROBIC Blood Culture results may not be optimal due to an inadequate volume of blood received in culture bottles   Culture   Final    NO GROWTH 4 DAYS Performed at [facility], [address] [place], [place] [address]    Report Status PENDING  Incomplete  Urine Culture     Status: Abnormal   Collection Time: 10/28/23  8:20 PM   Specimen: Urine, Random  Result Value Ref Range Status   Specimen Description   Final    URINE, RANDOM Performed at [facility], [address] [place], [place] [address]    Special Requests   Final    NONE Reflexed from [id number] Performed at [facility], [address] [place], [place] [address]    Culture 20,000 COLONIES/mL PROTEUS MIRABILIS (A)  Final   Report Status 10/30/2023 FINAL  Final   Organism ID, Bacteria PROTEUS MIRABILIS (A)  Final      Susceptibility   Proteus mirabilis - MIC*    AMPICILLIN <=2 SENSITIVE Sensitive     CEFAZOLIN <=4 SENSITIVE Sensitive     CEFEPIME <=0.12 SENSITIVE Sensitive     CEFTRIAXONE <=0.25 SENSITIVE Sensitive     CIPROFLOXACIN <=0.25 SENSITIVE Sensitive     GENTAMICIN <=1 SENSITIVE Sensitive     IMIPENEM 1 SENSITIVE Sensitive     NITROFURANTOIN >=512 RESISTANT Resistant     TRIMETH/SULFA <=20 SENSITIVE Sensitive      AMPICILLIN/SULBACTAM <=2 SENSITIVE Sensitive     PIP/TAZO <=4 SENSITIVE Sensitive ug/mL    * 20,000 COLONIES/mL PROTEUS MIRABILIS  Resp panel by RT-PCR (RSV, Flu A&B, Covid) Anterior Nasal Swab     Status: Abnormal   Collection Time: 10/28/23  9:58 PM   Specimen: Anterior Nasal Swab  Result Value Ref Range Status   SARS Coronavirus 2 by RT PCR POSITIVE (A) NEGATIVE Final    Comment: (NOTE) SARS-CoV-2 target nucleic acids are DETECTED.  The SARS-CoV-2 RNA is generally detectable in upper respiratory specimens during the acute phase of infection. Positive results are indicative of the presence of the identified virus, but do not rule out bacterial infection or co-infection with other pathogens not detected by the test. Clinical correlation with patient history and other diagnostic information is necessary to determine patient infection status. The expected result is Negative.  Fact Sheet for Patients: BloggerCourse.com  Fact Sheet for Healthcare Providers: SeriousBroker.it  This test is not yet approved or cleared by the [place] FDA and  has been authorized for detection and/or diagnosis of SARS-CoV-2 by FDA under an Emergency Use Authorization (EUA).  This EUA will remain in effect (meaning this test can be used) for the duration of  the COVID-19 declaration under Section 564(b)(1) of the A ct, 21 U.S.C. section 360bbb-3(b)(1), unless the authorization is terminated or revoked sooner.     Influenza A by PCR NEGATIVE NEGATIVE Final   Influenza B by PCR NEGATIVE NEGATIVE Final    Comment: (NOTE) The Xpert Xpress SARS-CoV-2/FLU/RSV plus assay is intended as an aid in the diagnosis of influenza from Nasopharyngeal swab specimens and should not be used as a sole basis for treatment. Nasal washings and aspirates are unacceptable for Xpert Xpress SARS-CoV-2/FLU/RSV testing.  Fact Sheet for  Patients: BloggerCourse.com  Fact Sheet for Healthcare Providers: SeriousBroker.it  This test is not yet approved or cleared by the [place] FDA and has been authorized for detection and/or diagnosis  of SARS-CoV-2 by FDA under an Emergency Use Authorization (EUA). This EUA will remain in effect (meaning this test can be used) for the duration of the COVID-19 declaration under Section 564(b)(1) of the Act, 21 U.S.C. section 360bbb-3(b)(1), unless the authorization is terminated or revoked.     Resp Syncytial Virus by PCR NEGATIVE NEGATIVE Final    Comment: (NOTE) Fact Sheet for Patients: BloggerCourse.com  Fact Sheet for Healthcare Providers: SeriousBroker.it  This test is not yet approved or cleared by the [place] FDA and has been authorized for detection and/or diagnosis of SARS-CoV-2 by FDA under an Emergency Use Authorization (EUA). This EUA will remain in effect (meaning this test can be used) for the duration of the COVID-19 declaration under Section 564(b)(1) of the Act, 21 U.S.C. section 360bbb-3(b)(1), unless the authorization is terminated or revoked.  Performed at [facility], [address] [place], [place] [address]   MRSA Next Gen by PCR, Nasal     Status: None   Collection Time: 10/28/23 11:16 PM   Specimen: Nasal Mucosa; Nasal Swab  Result Value Ref Range Status   MRSA by PCR Next Gen NOT DETECTED NOT DETECTED Final    Comment: (NOTE) The GeneXpert MRSA Assay (FDA approved for NASAL specimens only), is one component of a comprehensive MRSA colonization surveillance program. It is not intended to diagnose MRSA infection nor to guide or monitor treatment for MRSA infections. Test performance is not FDA approved in patients less than 63 years old. Performed at [facility], [address] [place], [place] [address]   Stool  culture     Status: None (Preliminary result)   Collection Time: 10/28/23 11:51 PM   Specimen: Stool  Result Value Ref Range Status   Salmonella/Shigella Screen Final report  Final   Campylobacter Culture PENDING  Incomplete   E coli, Shiga toxin Assay Negative Negative Final    Comment: (NOTE) Performed At: [facility] [address] [place], [place] [id number] Jolene Schimke MD WG:[id number]   STOOL CULTURE REFLEX - RSASHR     Status: None   Collection Time: 10/28/23 11:51 PM  Result Value Ref Range Status   Stool Culture result 1 (RSASHR) Comment  Final    Comment: (NOTE) No Salmonella or Shigella recovered. Performed At: [facility] [address] [place], [place] [id number] Jolene Schimke MD MW:[id number]   C Difficile Quick Screen w PCR reflex     Status: Abnormal   Collection Time: 10/29/23 10:47 AM   Specimen: Stool  Result Value Ref Range Status   C Diff antigen POSITIVE (A) NEGATIVE Final    Comment: CRITICAL RESULT CALLED TO, READ BACK BY AND VERIFIED WITH: CORN, C. 1401 10/29/2023 LRL    C Diff toxin POSITIVE (A) NEGATIVE Final    Comment: CRITICAL RESULT CALLED TO, READ BACK BY AND VERIFIED WITH: CORN, C. 1401 10/29/2023 LRL    C Diff interpretation Toxin producing C. difficile detected.  Final    Comment: CRITICAL RESULT CALLED TO, READ BACK BY AND VERIFIED WITH: CORN, C. 1401 10/29/2023 LRL Performed at [facility], [address] [place], [place] [address]   Gastrointestinal Panel by PCR , Stool     Status: None   Collection Time: 10/29/23 12:27 PM   Specimen: Stool  Result Value Ref Range Status   Campylobacter species NOT DETECTED NOT DETECTED Final   Plesimonas shigelloides NOT DETECTED NOT DETECTED Final   Salmonella species NOT DETECTED NOT DETECTED Final   Yersinia enterocolitica  NOT DETECTED NOT DETECTED Final   Vibrio species NOT DETECTED NOT DETECTED Final   Vibrio cholerae NOT DETECTED NOT DETECTED Final    Enteroaggregative E coli (EAEC) NOT DETECTED NOT DETECTED Final   Enteropathogenic E coli (EPEC) NOT DETECTED NOT DETECTED Final   Enterotoxigenic E coli (ETEC) NOT DETECTED NOT DETECTED Final   Shiga like toxin producing E coli (STEC) NOT DETECTED NOT DETECTED Final   Shigella/Enteroinvasive E coli (EIEC) NOT DETECTED NOT DETECTED Final   Cryptosporidium NOT DETECTED NOT DETECTED Final   Cyclospora cayetanensis NOT DETECTED NOT DETECTED Final   Entamoeba histolytica NOT DETECTED NOT DETECTED Final   Giardia lamblia NOT DETECTED NOT DETECTED Final   Adenovirus F40/41 NOT DETECTED NOT DETECTED Final   Astrovirus NOT DETECTED NOT DETECTED Final   Norovirus GI/GII NOT DETECTED NOT DETECTED Final   Rotavirus A NOT DETECTED NOT DETECTED Final   Sapovirus (I, II, IV, and V) NOT DETECTED NOT DETECTED Final    Comment: Performed at [facility], [address], [place], [place] [address]         Radiology Studies: US RENAL Result Date: 10/31/2023 CLINICAL DATA:  [id number] AKI (acute kidney injury) (HCC) [id number], inpatient EXAM: RENAL / URINARY TRACT ULTRASOUND COMPLETE COMPARISON:  10/28/2023 CT chest, abdomen and pelvis FINDINGS: Right Kidney: Renal measurements: 10.0 x 3.6 x 3.6 cm = volume: 69 mL. Mildly echogenic right renal parenchyma, normal thickness. No hydronephrosis. No renal masses. Left Kidney: Renal measurements: 9.8 x 4.7 x 4.2 cm = volume: 102 mL. Mildly echogenic left renal parenchyma, normal thickness. No hydronephrosis. No renal masses. Bladder: Bladder relatively collapsed by indwelling Foley catheter. Other: None. IMPRESSION: 1. No hydronephrosis. 2. Mildly echogenic renal parenchyma bilaterally, compatible with nonspecific acute renal parenchymal disease. Electronically Signed   By: Delbert Phenix M.D.   On: 10/31/2023 14:42        Scheduled Meds:  Chlorhexidine Gluconate Cloth  6 each Topical Daily   dexamethasone  6 mg Oral Daily   feeding supplement  1 Container  Oral TID BM   heparin injection (subcutaneous)  5,000 Units Subcutaneous Q8H   insulin aspart  0-15 Units Subcutaneous Q4H   levothyroxine  75 mcg Oral Q0600   multivitamin with minerals  1 tablet Oral Daily   sertraline  100 mg Oral Daily   sodium chloride flush  10-40 mL Intracatheter Q12H   vancomycin  500 mg Oral Q6H   Continuous Infusions:  cefTRIAXone (ROCEPHIN)  IV 2 g (10/31/23 1744)   metronidazole 500 mg (11/01/23 0620)   sodium bicarbonate 150 mEq in dextrose 5 % 1,150 mL infusion 40 mL/hr at 10/31/23 1215     LOS: 4 days    Time spent: 35 minutes    Cobin Cadavid Sherryll Burger, MD [facility] Pager [phone]  If 7PM-7AM, please contact night-coverage [email or web address]  11/01/2023, 2:07 PM

## 2023-11-01 NOTE — Consult Note (Signed)
PHARMACY CONSULT NOTE - ELECTROLYTES  Pharmacy Consult for Electrolyte Monitoring and Replacement   Recent Labs: Weight: 66.3 kg (146 lb 2.6 oz) Estimated Creatinine Clearance: 6.8 mL/min (A) (by C-G formula based on SCr of 5.34 mg/dL (H)). Potassium  Date Value  11/01/2023 3.7 mmol/L  04/17/2017 5.0 mEq/L   Magnesium (mg/dL)  Date Value  16/06/9603 2.2   Calcium (mg/dL)  Date Value  54/05/8118 8.2 (L)  04/17/2017 10.1   Albumin (g/dL)  Date Value  14/78/2956 2.5 (L)  04/17/2017 4.0   Phosphorus (mg/dL)  Date Value  21/30/8657 5.8 (H)   Sodium  Date Value  11/01/2023 134 mmol/L (L)  04/17/2017 141 mEq/L   Assessment  Jeanne Tran is a 89 y.o. female presenting with hypotension. PMH significant for GERD, breast cancer. Pharmacy has been consulted to monitor and replace electrolytes. Pt has c.diff on PO vancomycin. Acute kidney injury now secondary to ischemic ATN from hypotension and underlying sepsis.   MIVF: Nabicarb @40  ml/hr.  Pertinent medications: N/A  Goal of Therapy: Electrolytes WNL  Plan:  No replacement needed at this time. Pt transferred to 2A. Pharmacy will sign off. Please re-consult if needed. Phos elevated, as pt's renal function improves, phos level should as well. Defer to nephro if phos blinders are needed.   Ronnald Ramp, PharmD 11/01/2023 7:22 AM

## 2023-11-02 DIAGNOSIS — N179 Acute kidney failure, unspecified: Secondary | ICD-10-CM | POA: Diagnosis not present

## 2023-11-02 DIAGNOSIS — A419 Sepsis, unspecified organism: Secondary | ICD-10-CM | POA: Diagnosis not present

## 2023-11-02 DIAGNOSIS — Z515 Encounter for palliative care: Secondary | ICD-10-CM

## 2023-11-02 DIAGNOSIS — A0472 Enterocolitis due to Clostridium difficile, not specified as recurrent: Secondary | ICD-10-CM | POA: Diagnosis not present

## 2023-11-02 DIAGNOSIS — R6521 Severe sepsis with septic shock: Secondary | ICD-10-CM | POA: Diagnosis not present

## 2023-11-02 LAB — COMPREHENSIVE METABOLIC PANEL
ALT: 178 U/L — ABNORMAL HIGH (ref 0–44)
AST: 56 U/L — ABNORMAL HIGH (ref 15–41)
Albumin: 2.6 g/dL — ABNORMAL LOW (ref 3.5–5.0)
Alkaline Phosphatase: 107 U/L (ref 38–126)
Anion gap: 17 — ABNORMAL HIGH (ref 5–15)
BUN: 86 mg/dL — ABNORMAL HIGH (ref 8–23)
CO2: 24 mmol/L (ref 22–32)
Calcium: 8.1 mg/dL — ABNORMAL LOW (ref 8.9–10.3)
Chloride: 95 mmol/L — ABNORMAL LOW (ref 98–111)
Creatinine, Ser: 5.71 mg/dL — ABNORMAL HIGH (ref 0.44–1.00)
GFR, Estimated: 7 mL/min — ABNORMAL LOW (ref >60)
Glucose, Bld: 136 mg/dL — ABNORMAL HIGH (ref 70–99)
Potassium: 3.5 mmol/L (ref 3.5–5.1)
Sodium: 136 mmol/L (ref 135–145)
Total Bilirubin: 0.7 mg/dL (ref 0.0–1.2)
Total Protein: 4.8 g/dL — ABNORMAL LOW (ref 6.5–8.1)

## 2023-11-02 LAB — CULTURE, BLOOD (ROUTINE X 2)
Culture: NO GROWTH
Culture: NO GROWTH

## 2023-11-02 LAB — CBC
HCT: 25.9 % — ABNORMAL LOW (ref 36.0–46.0)
Hemoglobin: 9.2 g/dL — ABNORMAL LOW (ref 12.0–15.0)
MCH: 33.2 pg (ref 26.0–34.0)
MCHC: 35.5 g/dL (ref 30.0–36.0)
MCV: 93.5 fL (ref 80.0–100.0)
Platelets: 120 10*3/uL — ABNORMAL LOW (ref 150–400)
RBC: 2.77 MIL/uL — ABNORMAL LOW (ref 3.87–5.11)
RDW: 13.9 % (ref 11.5–15.5)
WBC: 27.1 10*3/uL — ABNORMAL HIGH (ref 4.0–10.5)
nRBC: 0.1 % (ref 0.0–0.2)

## 2023-11-02 LAB — GLUCOSE, CAPILLARY
Glucose-Capillary: 115 mg/dL — ABNORMAL HIGH (ref 70–99)
Glucose-Capillary: 116 mg/dL — ABNORMAL HIGH (ref 70–99)
Glucose-Capillary: 131 mg/dL — ABNORMAL HIGH (ref 70–99)
Glucose-Capillary: 145 mg/dL — ABNORMAL HIGH (ref 70–99)

## 2023-11-02 LAB — MAGNESIUM: Magnesium: 2.3 mg/dL (ref 1.7–2.4)

## 2023-11-02 LAB — PHOSPHORUS: Phosphorus: 5 mg/dL — ABNORMAL HIGH (ref 2.5–4.6)

## 2023-11-02 MED ORDER — VANCOMYCIN 50 MG/ML ORAL SOLUTION
500.0000 mg | Freq: Four times a day (QID) | ORAL | Status: DC
  Administered 2023-11-02 (×2): 500 mg via ORAL
  Filled 2023-11-02 (×3): qty 10

## 2023-11-02 MED ORDER — ACETAMINOPHEN 325 MG PO TABS: 650.0000 mg | ORAL_TABLET | Freq: Four times a day (QID) | ORAL | Status: DC | PRN

## 2023-11-02 MED ORDER — ACETAMINOPHEN 650 MG RE SUPP: 650.0000 mg | Freq: Four times a day (QID) | RECTAL | Status: DC | PRN

## 2023-11-02 MED ORDER — GLYCOPYRROLATE 0.2 MG/ML IJ SOLN: 0.2000 mg | INTRAMUSCULAR | Status: DC | PRN

## 2023-11-02 MED ORDER — POLYVINYL ALCOHOL 1.4 % OP SOLN: 1.0000 [drp] | Freq: Four times a day (QID) | OPHTHALMIC | Status: DC | PRN

## 2023-11-02 MED ORDER — GLYCOPYRROLATE 1 MG PO TABS: 1.0000 mg | ORAL_TABLET | ORAL | Status: DC | PRN

## 2023-11-02 MED ORDER — SODIUM CHLORIDE 0.9% FLUSH: 3.0000 mL | INTRAVENOUS | Status: DC | PRN

## 2023-11-02 MED ORDER — SODIUM CHLORIDE 0.9% FLUSH
3.0000 mL | Freq: Two times a day (BID) | INTRAVENOUS | Status: DC
  Administered 2023-11-02: 10 mL via INTRAVENOUS
  Administered 2023-11-03: 5 mL via INTRAVENOUS
  Administered 2023-11-03: 10 mL via INTRAVENOUS
  Administered 2023-11-04: 3 mL via INTRAVENOUS

## 2023-11-02 MED ORDER — MORPHINE SULFATE (PF) 2 MG/ML IV SOLN
0.5000 mg | INTRAVENOUS | Status: DC | PRN
  Administered 2023-11-02: 0.5 mg via INTRAVENOUS
  Filled 2023-11-02: qty 1

## 2023-11-02 MED ORDER — LORAZEPAM 2 MG/ML IJ SOLN
0.5000 mg | INTRAMUSCULAR | Status: DC | PRN
  Administered 2023-11-02: 0.5 mg via INTRAVENOUS
  Filled 2023-11-02: qty 1

## 2023-11-02 MED ORDER — HALOPERIDOL LACTATE 5 MG/ML IJ SOLN: 2.5000 mg | INTRAMUSCULAR | Status: DC | PRN

## 2023-11-02 NOTE — Progress Notes (Addendum)
[facility] Liaison Note  Received request from Transitions of Care Manager, Meagan Hagwood, LCSW, for hospice services at [facility] after discharge.  Spoke with son to initiate education related to hospice philosophy, services, and team approach to care. Son verbalized understanding of information given.   Facility to provide needed DME.    Please send signed and completed DNR LTC with patient/family. Please provide prescriptions at discharge as needed to ensure ongoing symptom management.    [facility] information and contact numbers given to family & above information shared with TOC.   Please call with any questions/concerns.    Thank you for the opportunity to participate in this patient's care.  [facility] Liaison [phone]

## 2023-11-02 NOTE — Progress Notes (Signed)
1      PROGRESS NOTE    Jeanne Tran  ZOX:[id number] DOB: 1934-06-20 DOA: 10/28/2023 PCP: Madelin Headings, MD    Brief Narrative:   89 year old with a PMH significant for HLD, GERD, CKD stage 3, Hypothyroidism, Pulmonary nodule, RLS, Breast cancer, Hip fracture 04/2023 that presented to the ER 10/28/2023 with AMS.  She was admitted to ICU for septic shock  2/11: Patient admitted with septic shock, likely due to UTI. Levophed/vasopressin. Bicarbonate drip/bolus's. Antibiotics, cultures, stress dose steroids.  02/12: C.diff +. Started on Flagyl and PO vanco. Not a candidate for Surgery.  02/13: Hemodynamically improving. Mental status improved.  2/14: Transfer out of ICU 2/15: TRH picked up.  Kidney function continues to get worse 2/16: Hospice screening  Assessment & Plan:   Principal Problem:   Septic shock (HCC) Active Problems:   Hospice care   C. difficile colitis   # Shock likely distributive/septic due to UTI in the setting of (Resolved) -urine culture growing Proteus Mirabilis.  Plan for comfort care #C.diff colitis (Fulminant C.diff) not a candidate for surgical intervention.  Diarrhea improving # Lactic acidosis with downtrending lactate currently at 4.0 --> 3.4. secondary to the above.  Will order lactic acid trends # AKI with CKD stage IIIb creatinine 2.2 mg/dL from a baseline of 1.5 mg/dL likely ATN in the setting of sepsis.Worsening currently at 5.71 mg/dl.  Not a acute indication for dialysis yet as patient is making urine and not quite uremic.  She is likely not a good candidate for dialysis either based on my discussion with nephrology.  Family chose comfort care/hospice # Hepatitis with elevated AST at 1400 and elevated ALT at 900 likely in the setting of shock liver downtrending.  Monitor # COVID-19 positive on 10/28/2023  # Hypothyroidism: Continue levothyroxine # Goals of care: Family shared that patient has had significant clinical and functional decline last 6  months.  She is not ambulating much at [facility] since her hip fracture.  Family chose comfort care/hospice at [facility].  Overall poor prognosis  DVT prophylaxis:  heparin injection 5,000 Units Start: 10/30/23 1545 SCDs Start: 10/28/23 2318     Code Status: DNR Family Communication: Updated son, daughter and other family was at bedside Disposition Plan: Possible discharge in next 2 days back to [facility] with hospice   Consultants:  Nephrology Palliative care   Subjective:  Pleasantly confused.  Wants to stay comfortable.  She is tired.  Pulled out her neck central line  Objective: Vitals:   11/02/23 0044 11/02/23 0535 11/02/23 0818 11/02/23 1257  BP: (!) 140/87 120/81 108/67 (!) 128/95  Pulse: 92  (!) 108 (!) 102  Resp: 18 18 16 20   Temp: 98.1 F (36.7 C) (!) 97.3 F (36.3 C) 98.3 F (36.8 C) 97.6 F (36.4 C)  TempSrc: Oral Oral Axillary Axillary  SpO2: 95% 91% 94% 94%  Weight:        Intake/Output Summary (Last 24 hours) at 11/02/2023 1500 Last data filed at 11/02/2023 1106 Gross per 24 hour  Intake 1895.19 ml  Output 1000 ml  Net 895.19 ml   Filed Weights   10/30/23 0500 10/31/23 0454 11/01/23 0735  Weight: 65.9 kg 66.3 kg 74.3 kg    Examination:  General exam: Cachectic looking, chronically ill and frail appearing Respiratory system: Clear to auscultation. Respiratory effort normal. Cardiovascular system: S1 & S2 heard, RRR. No JVD, murmurs, trace pedal edema Gastrointestinal system: Abdomen is soft, benign Central nervous system:  Alert and awake, pleasantly confused. No focal neurological deficits. Extremities: Symmetric 5 x 5 power. Skin: No rashes, lesions or ulcers Psychiatry: Pleasantly confused    Data Reviewed: I have personally reviewed following labs and imaging studies  CBC: Recent Labs  Lab 10/28/23 1938 10/29/23 0133 10/29/23 1406 10/30/23 0456 10/31/23 0353 11/01/23 0638 11/02/23 0500  WBC 15.2*   < > 36.5* 31.2* 25.3*  25.3* 27.1*  NEUTROABS 6.3  --   --   --   --   --   --   HGB 10.7*   < > 11.3* 9.9* 8.9* 9.0* 9.2*  HCT 35.4*   < > 33.5* 29.3* 25.1* 26.0* 25.9*  MCV 109.6*   < > 98.2 96.4 93.0 94.5 93.5  PLT 190   < > 143* 147* 109* 122* 120*   < > = values in this interval not displayed.   Basic Metabolic Panel: Recent Labs  Lab 10/29/23 0426 10/29/23 1227 10/30/23 0456 10/31/23 0353 11/01/23 0638 11/02/23 0500  NA 136 135 132* 131* 134* 136  K 4.3 3.8 4.3 4.5 3.7 3.5  CL 105 101 99 97* 99 95*  CO2 15* 17* 19* 19* 22 24  GLUCOSE 163* 86 180* 118* 131* 136*  BUN 47* 53* 61* 73* 83* 86*  CREATININE 1.99* 2.28* 3.09* 4.20* 5.34* 5.71*  CALCIUM 8.0* 8.2* 8.0* 7.9* 8.2* 8.1*  MG 1.9  --  1.5* 2.3 2.2 2.3  PHOS 6.3*  --  4.9* 5.1* 5.8* 5.0*   GFR: Estimated Creatinine Clearance: 6.7 mL/min (A) (by C-G formula based on SCr of 5.71 mg/dL (H)). Liver Function Tests: Recent Labs  Lab 10/28/23 1938 10/29/23 1227 10/30/23 0456 11/02/23 0500  AST 110* 1,486* 642* 56*  ALT 57* 908* 599* 178*  ALKPHOS 100 190* 139* 107  BILITOT 0.6 0.8 0.9 0.7  PROT 5.0* 4.6* 5.0* 4.8*  ALBUMIN 2.5* 2.4* 2.5* 2.6*   No results for input(s): "LIPASE", "AMYLASE" in the last 168 hours. No results for input(s): "AMMONIA" in the last 168 hours. Coagulation Profile: Recent Labs  Lab 10/28/23 1938 10/29/23 0133 10/29/23 0426  INR 1.4* 2.3* 2.4*    CBG: Recent Labs  Lab 11/01/23 2031 11/01/23 2353 11/02/23 0453 11/02/23 0822 11/02/23 1233  GLUCAP 124* 108* 115* 116* 131*    Sepsis Labs: Recent Labs  Lab 10/29/23 0133 10/29/23 0427 10/29/23 1454 10/30/23 0456 11/01/23 1459 11/01/23 1533  PROCALCITON 0.84  --   --   --   --   --   LATICACIDVEN 5.1*   < > 3.4* 3.4* 1.7 1.8   < > = values in this interval not displayed.    Recent Results (from the past 240 hours)  Blood Culture (routine x 2)     Status: None   Collection Time: 10/28/23  7:38 PM   Specimen: BLOOD RIGHT WRIST  Result Value  Ref Range Status   Specimen Description BLOOD RIGHT WRIST  Final   Special Requests   Final    BOTTLES DRAWN AEROBIC AND ANAEROBIC Blood Culture results may not be optimal due to an inadequate volume of blood received in culture bottles   Culture   Final    NO GROWTH 5 DAYS Performed at [facility], [address] [place], [place] [address]    Report Status 11/02/2023 FINAL  Final  Blood Culture (routine x 2)     Status: None   Collection Time: 10/28/23  7:45 PM   Specimen: Right Antecubital; Blood  Result Value  Ref Range Status   Specimen Description RIGHT ANTECUBITAL  Final   Special Requests   Final    BOTTLES DRAWN AEROBIC AND ANAEROBIC Blood Culture results may not be optimal due to an inadequate volume of blood received in culture bottles   Culture   Final    NO GROWTH 5 DAYS Performed at [facility], [address] [place], [place] [address]    Report Status 11/02/2023 FINAL  Final  Urine Culture     Status: Abnormal   Collection Time: 10/28/23  8:20 PM   Specimen: Urine, Random  Result Value Ref Range Status   Specimen Description   Final    URINE, RANDOM Performed at [facility], [address] [place], [place] [address]    Special Requests   Final    NONE Reflexed from [id number] Performed at [facility], [address] [place], [place] [address]    Culture 20,000 COLONIES/mL PROTEUS MIRABILIS (A)  Final   Report Status 10/30/2023 FINAL  Final   Organism ID, Bacteria PROTEUS MIRABILIS (A)  Final      Susceptibility   Proteus mirabilis - MIC*    AMPICILLIN <=2 SENSITIVE Sensitive     CEFAZOLIN <=4 SENSITIVE Sensitive     CEFEPIME <=0.12 SENSITIVE Sensitive     CEFTRIAXONE <=0.25 SENSITIVE Sensitive     CIPROFLOXACIN <=0.25 SENSITIVE Sensitive     GENTAMICIN <=1 SENSITIVE Sensitive     IMIPENEM 1 SENSITIVE Sensitive     NITROFURANTOIN >=512 RESISTANT Resistant     TRIMETH/SULFA <=20 SENSITIVE Sensitive      AMPICILLIN/SULBACTAM <=2 SENSITIVE Sensitive     PIP/TAZO <=4 SENSITIVE Sensitive ug/mL    * 20,000 COLONIES/mL PROTEUS MIRABILIS  Resp panel by RT-PCR (RSV, Flu A&B, Covid) Anterior Nasal Swab     Status: Abnormal   Collection Time: 10/28/23  9:58 PM   Specimen: Anterior Nasal Swab  Result Value Ref Range Status   SARS Coronavirus 2 by RT PCR POSITIVE (A) NEGATIVE Final    Comment: (NOTE) SARS-CoV-2 target nucleic acids are DETECTED.  The SARS-CoV-2 RNA is generally detectable in upper respiratory specimens during the acute phase of infection. Positive results are indicative of the presence of the identified virus, but do not rule out bacterial infection or co-infection with other pathogens not detected by the test. Clinical correlation with patient history and other diagnostic information is necessary to determine patient infection status. The expected result is Negative.  Fact Sheet for Patients: BloggerCourse.com  Fact Sheet for Healthcare Providers: SeriousBroker.it  This test is not yet approved or cleared by the [place] FDA and  has been authorized for detection and/or diagnosis of SARS-CoV-2 by FDA under an Emergency Use Authorization (EUA).  This EUA will remain in effect (meaning this test can be used) for the duration of  the COVID-19 declaration under Section 564(b)(1) of the A ct, 21 U.S.C. section 360bbb-3(b)(1), unless the authorization is terminated or revoked sooner.     Influenza A by PCR NEGATIVE NEGATIVE Final   Influenza B by PCR NEGATIVE NEGATIVE Final    Comment: (NOTE) The Xpert Xpress SARS-CoV-2/FLU/RSV plus assay is intended as an aid in the diagnosis of influenza from Nasopharyngeal swab specimens and should not be used as a sole basis for treatment. Nasal washings and aspirates are unacceptable for Xpert Xpress SARS-CoV-2/FLU/RSV testing.  Fact Sheet for  Patients: BloggerCourse.com  Fact Sheet for Healthcare Providers: SeriousBroker.it  This test is not yet approved or cleared by the  [place] [employer] trader and has been authorized for detection and/or diagnosis of SARS-CoV-2 by FDA under an TEFL teacher (EUA). This EUA will remain in effect (meaning this test can be used) for the duration of the COVID-19 declaration under Section 564(b)(1) of the Act, 21 U.S.C. section 360bbb-3(b)(1), unless the authorization is terminated or revoked.     Resp Syncytial Virus by PCR NEGATIVE NEGATIVE Final    Comment: (NOTE) Fact Sheet for Patients: BloggerCourse.com  Fact Sheet for Healthcare Providers: SeriousBroker.it  This test is not yet approved or cleared by the [place] FDA and has been authorized for detection and/or diagnosis of SARS-CoV-2 by FDA under an Emergency Use Authorization (EUA). This EUA will remain in effect (meaning this test can be used) for the duration of the COVID-19 declaration under Section 564(b)(1) of the Act, 21 U.S.C. section 360bbb-3(b)(1), unless the authorization is terminated or revoked.  Performed at [facility], [address] [place], [place] [address]   MRSA Next Gen by PCR, Nasal     Status: None   Collection Time: 10/28/23 11:16 PM   Specimen: Nasal Mucosa; Nasal Swab  Result Value Ref Range Status   MRSA by PCR Next Gen NOT DETECTED NOT DETECTED Final    Comment: (NOTE) The GeneXpert MRSA Assay (FDA approved for NASAL specimens only), is one component of a comprehensive MRSA colonization surveillance program. It is not intended to diagnose MRSA infection nor to guide or monitor treatment for MRSA infections. Test performance is not FDA approved in patients less than 34 years old. Performed at [facility], [address] [place], [place] [address]   Stool  culture     Status: None (Preliminary result)   Collection Time: 10/28/23 11:51 PM   Specimen: Stool  Result Value Ref Range Status   Salmonella/Shigella Screen Final report  Final   Campylobacter Culture PENDING  Incomplete   E coli, Shiga toxin Assay Negative Negative Final    Comment: (NOTE) Performed At: [facility] [address] [place], [place] [id number] Jolene Schimke MD WG:[id number]   STOOL CULTURE REFLEX - RSASHR     Status: None   Collection Time: 10/28/23 11:51 PM  Result Value Ref Range Status   Stool Culture result 1 (RSASHR) Comment  Final    Comment: (NOTE) No Salmonella or Shigella recovered. Performed At: [facility] [address] [place], [place] [id number] Jolene Schimke MD MW:[id number]   C Difficile Quick Screen w PCR reflex     Status: Abnormal   Collection Time: 10/29/23 10:47 AM   Specimen: Stool  Result Value Ref Range Status   C Diff antigen POSITIVE (A) NEGATIVE Final    Comment: CRITICAL RESULT CALLED TO, READ BACK BY AND VERIFIED WITH: CORN, C. 1401 10/29/2023 LRL    C Diff toxin POSITIVE (A) NEGATIVE Final    Comment: CRITICAL RESULT CALLED TO, READ BACK BY AND VERIFIED WITH: CORN, C. 1401 10/29/2023 LRL    C Diff interpretation Toxin producing C. difficile detected.  Final    Comment: CRITICAL RESULT CALLED TO, READ BACK BY AND VERIFIED WITH: CORN, C. 1401 10/29/2023 LRL Performed at [facility], [address] [place], [place] [address]   Gastrointestinal Panel by PCR , Stool     Status: None   Collection Time: 10/29/23 12:27 PM   Specimen: Stool  Result Value Ref Range Status   Campylobacter species NOT DETECTED NOT DETECTED Final   Plesimonas shigelloides NOT DETECTED NOT DETECTED Final  Salmonella species NOT DETECTED NOT DETECTED Final   Yersinia enterocolitica NOT DETECTED NOT DETECTED Final   Vibrio species NOT DETECTED NOT DETECTED Final   Vibrio cholerae NOT DETECTED NOT DETECTED Final    Enteroaggregative E coli (EAEC) NOT DETECTED NOT DETECTED Final   Enteropathogenic E coli (EPEC) NOT DETECTED NOT DETECTED Final   Enterotoxigenic E coli (ETEC) NOT DETECTED NOT DETECTED Final   Shiga like toxin producing E coli (STEC) NOT DETECTED NOT DETECTED Final   Shigella/Enteroinvasive E coli (EIEC) NOT DETECTED NOT DETECTED Final   Cryptosporidium NOT DETECTED NOT DETECTED Final   Cyclospora cayetanensis NOT DETECTED NOT DETECTED Final   Entamoeba histolytica NOT DETECTED NOT DETECTED Final   Giardia lamblia NOT DETECTED NOT DETECTED Final   Adenovirus F40/41 NOT DETECTED NOT DETECTED Final   Astrovirus NOT DETECTED NOT DETECTED Final   Norovirus GI/GII NOT DETECTED NOT DETECTED Final   Rotavirus A NOT DETECTED NOT DETECTED Final   Sapovirus (I, II, IV, and V) NOT DETECTED NOT DETECTED Final    Comment: Performed at [facility], [address], [place], [place] [address]         Radiology Studies: No results found.       Scheduled Meds:  Chlorhexidine Gluconate Cloth  6 each Topical Daily   feeding supplement  1 Container Oral TID BM   heparin injection (subcutaneous)  5,000 Units Subcutaneous Q8H   insulin aspart  0-15 Units Subcutaneous Q4H   levothyroxine  75 mcg Oral Q0600   multivitamin with minerals  1 tablet Oral Daily   sertraline  100 mg Oral Daily   sodium chloride flush  10-40 mL Intracatheter Q12H   sodium chloride flush  3-10 mL Intravenous Q12H   Continuous Infusions:     LOS: 5 days    Time spent: 35 minutes    Delfino Lovett, MD [facility] Pager [phone]  If 7PM-7AM, please contact night-coverage [email or web address]  11/02/2023, 3:00 PM

## 2023-11-02 NOTE — Plan of Care (Signed)
  Problem: Clinical Measurements: Goal: Respiratory complications will improve Outcome: Progressing Goal: Cardiovascular complication will be avoided Outcome: Progressing   Problem: Activity: Goal: Risk for activity intolerance will decrease Outcome: Progressing   Problem: Nutrition: Goal: Adequate nutrition will be maintained Outcome: Progressing   Problem: Coping: Goal: Level of anxiety will decrease Outcome: Progressing   Problem: Elimination: Goal: Will not experience complications related to bowel motility Outcome: Progressing Goal: Will not experience complications related to urinary retention Outcome: Progressing

## 2023-11-02 NOTE — Progress Notes (Signed)
[facility]  ROUNDING NOTE   Subjective:   Patient seen resting quietly in bed No family present Remains alert  Creatinine 5.74 Urine output 1050 mL in preceding 24 hours   Objective:  Vital signs in last 24 hours:  Temp:  [97.3 F (36.3 C)-98.3 F (36.8 C)] 98.3 F (36.8 C) (02/16 0818) Pulse Rate:  [92-108] 108 (02/16 0818) Resp:  [16-20] 16 (02/16 0818) BP: (108-144)/(67-95) 108/67 (02/16 0818) SpO2:  [90 %-95 %] 94 % (02/16 0818)  Weight change:  Filed Weights   10/30/23 0500 10/31/23 0454 11/01/23 0735  Weight: 65.9 kg 66.3 kg 74.3 kg    Intake/Output: I/O last 3 completed shifts: In: 2115.2 [P.O.:320; I.V.:1195.2; IV Piggyback:600] Out: 1300 [Urine:1050; Stool:250]   Intake/Output this shift:  Total I/O In: -  Out: 150 [Urine:150]  Physical Exam: General: Ill-appearing  Head: Normocephalic, atraumatic. Moist oral mucosal membranes  Eyes: Anicteric  Lungs:  Clear to auscultation, NCO2  Heart: Regular rate and rhythm  Abdomen:  Soft, nontender  Extremities: Trace peripheral edema.  Neurologic: Alert and oriented, moving all four extremities  Skin: No lesions  Access: None    Basic Metabolic Panel: Recent Labs  Lab 10/29/23 0426 10/29/23 1227 10/30/23 0456 10/31/23 0353 11/01/23 0638 11/02/23 0500  NA 136 135 132* 131* 134* 136  K 4.3 3.8 4.3 4.5 3.7 3.5  CL 105 101 99 97* 99 95*  CO2 15* 17* 19* 19* 22 24  GLUCOSE 163* 86 180* 118* 131* 136*  BUN 47* 53* 61* 73* 83* 86*  CREATININE 1.99* 2.28* 3.09* 4.20* 5.34* 5.71*  CALCIUM 8.0* 8.2* 8.0* 7.9* 8.2* 8.1*  MG 1.9  --  1.5* 2.3 2.2 2.3  PHOS 6.3*  --  4.9* 5.1* 5.8* 5.0*    Liver Function Tests: Recent Labs  Lab 10/28/23 1938 10/29/23 1227 10/30/23 0456 11/02/23 0500  AST 110* 1,486* 642* 56*  ALT 57* 908* 599* 178*  ALKPHOS 100 190* 139* 107  BILITOT 0.6 0.8 0.9 0.7  PROT 5.0* 4.6* 5.0* 4.8*  ALBUMIN 2.5* 2.4* 2.5* 2.6*   No results for input(s): "LIPASE",  "AMYLASE" in the last 168 hours. No results for input(s): "AMMONIA" in the last 168 hours.  CBC: Recent Labs  Lab 10/28/23 1938 10/29/23 0133 10/29/23 1406 10/30/23 0456 10/31/23 0353 11/01/23 0638 11/02/23 0500  WBC 15.2*   < > 36.5* 31.2* 25.3* 25.3* 27.1*  NEUTROABS 6.3  --   --   --   --   --   --   HGB 10.7*   < > 11.3* 9.9* 8.9* 9.0* 9.2*  HCT 35.4*   < > 33.5* 29.3* 25.1* 26.0* 25.9*  MCV 109.6*   < > 98.2 96.4 93.0 94.5 93.5  PLT 190   < > 143* 147* 109* 122* 120*   < > = values in this interval not displayed.    Cardiac Enzymes: No results for input(s): "CKTOTAL", "CKMB", "CKMBINDEX", "TROPONINI" in the last 168 hours.  BNP: Invalid input(s): "POCBNP"  CBG: Recent Labs  Lab 11/01/23 2031 11/01/23 2353 11/02/23 0453 11/02/23 0822 11/02/23 1233  GLUCAP 124* 108* 115* 116* 131*    Microbiology: Results for orders placed or performed during the hospital encounter of 10/28/23  Blood Culture (routine x 2)     Status: None   Collection Time: 10/28/23  7:38 PM   Specimen: BLOOD RIGHT WRIST  Result Value Ref Range Status   Specimen Description BLOOD RIGHT WRIST  Final   Special Requests  Final    BOTTLES DRAWN AEROBIC AND ANAEROBIC Blood Culture results may not be optimal due to an inadequate volume of blood received in culture bottles   Culture   Final    NO GROWTH 5 DAYS Performed at [facility], [address] [place], [place] [address]    Report Status 11/02/2023 FINAL  Final  Blood Culture (routine x 2)     Status: None   Collection Time: 10/28/23  7:45 PM   Specimen: Right Antecubital; Blood  Result Value Ref Range Status   Specimen Description RIGHT ANTECUBITAL  Final   Special Requests   Final    BOTTLES DRAWN AEROBIC AND ANAEROBIC Blood Culture results may not be optimal due to an inadequate volume of blood received in culture bottles   Culture   Final    NO GROWTH 5 DAYS Performed at [facility], [address]  [place], [place] [address]    Report Status 11/02/2023 FINAL  Final  Urine Culture     Status: Abnormal   Collection Time: 10/28/23  8:20 PM   Specimen: Urine, Random  Result Value Ref Range Status   Specimen Description   Final    URINE, RANDOM Performed at [facility], [address], [place], [place] [address]    Special Requests   Final    NONE Reflexed from [phone] Performed at [facility], [address] [place], [place] [address]    Culture 20,000 COLONIES/mL PROTEUS MIRABILIS (A)  Final   Report Status 10/30/2023 FINAL  Final   Organism ID, Bacteria PROTEUS MIRABILIS (A)  Final      Susceptibility   Proteus mirabilis - MIC*    AMPICILLIN <=2 SENSITIVE Sensitive     CEFAZOLIN <=4 SENSITIVE Sensitive     CEFEPIME <=0.12 SENSITIVE Sensitive     CEFTRIAXONE <=0.25 SENSITIVE Sensitive     CIPROFLOXACIN <=0.25 SENSITIVE Sensitive     GENTAMICIN <=1 SENSITIVE Sensitive     IMIPENEM 1 SENSITIVE Sensitive     NITROFURANTOIN >=512 RESISTANT Resistant     TRIMETH/SULFA <=20 SENSITIVE Sensitive     AMPICILLIN/SULBACTAM <=2 SENSITIVE Sensitive     PIP/TAZO <=4 SENSITIVE Sensitive ug/mL    * 20,000 COLONIES/mL PROTEUS MIRABILIS  Resp panel by RT-PCR (RSV, Flu A&B, Covid) Anterior Nasal Swab     Status: Abnormal   Collection Time: 10/28/23  9:58 PM   Specimen: Anterior Nasal Swab  Result Value Ref Range Status   SARS Coronavirus 2 by RT PCR POSITIVE (A) NEGATIVE Final    Comment: (NOTE) SARS-CoV-2 target nucleic acids are DETECTED.  The SARS-CoV-2 RNA is generally detectable in upper respiratory specimens during the acute phase of infection. Positive results are indicative of the presence of the identified virus, but do not rule out bacterial infection or co-infection with other pathogens not detected by the test. Clinical correlation with patient history and other diagnostic information is necessary to determine patient infection status. The expected result is  Negative.  Fact Sheet for Patients: BloggerCourse.com  Fact Sheet for Healthcare Providers: SeriousBroker.it  This test is not yet approved or cleared by the [place] FDA and  has been authorized for detection and/or diagnosis of SARS-CoV-2 by FDA under an Emergency Use Authorization (EUA).  This EUA will remain in effect (meaning this test can be used) for the duration of  the COVID-19 declaration under Section 564(b)(1) of the A ct, 21 U.S.C. section 360bbb-3(b)(1), unless the authorization is terminated or revoked sooner.  Influenza A by PCR NEGATIVE NEGATIVE Final   Influenza B by PCR NEGATIVE NEGATIVE Final    Comment: (NOTE) The Xpert Xpress SARS-CoV-2/FLU/RSV plus assay is intended as an aid in the diagnosis of influenza from Nasopharyngeal swab specimens and should not be used as a sole basis for treatment. Nasal washings and aspirates are unacceptable for Xpert Xpress SARS-CoV-2/FLU/RSV testing.  Fact Sheet for Patients: BloggerCourse.com  Fact Sheet for Healthcare Providers: SeriousBroker.it  This test is not yet approved or cleared by the [place] FDA and has been authorized for detection and/or diagnosis of SARS-CoV-2 by FDA under an Emergency Use Authorization (EUA). This EUA will remain in effect (meaning this test can be used) for the duration of the COVID-19 declaration under Section 564(b)(1) of the Act, 21 U.S.C. section 360bbb-3(b)(1), unless the authorization is terminated or revoked.     Resp Syncytial Virus by PCR NEGATIVE NEGATIVE Final    Comment: (NOTE) Fact Sheet for Patients: BloggerCourse.com  Fact Sheet for Healthcare Providers: SeriousBroker.it  This test is not yet approved or cleared by the [place] FDA and has been authorized for detection and/or diagnosis of SARS-CoV-2  by FDA under an Emergency Use Authorization (EUA). This EUA will remain in effect (meaning this test can be used) for the duration of the COVID-19 declaration under Section 564(b)(1) of the Act, 21 U.S.C. section 360bbb-3(b)(1), unless the authorization is terminated or revoked.  Performed at [facility], [address] [place], [place] [address]   MRSA Next Gen by PCR, Nasal     Status: None   Collection Time: 10/28/23 11:16 PM   Specimen: Nasal Mucosa; Nasal Swab  Result Value Ref Range Status   MRSA by PCR Next Gen NOT DETECTED NOT DETECTED Final    Comment: (NOTE) The GeneXpert MRSA Assay (FDA approved for NASAL specimens only), is one component of a comprehensive MRSA colonization surveillance program. It is not intended to diagnose MRSA infection nor to guide or monitor treatment for MRSA infections. Test performance is not FDA approved in patients less than 58 years old. Performed at [facility], [address] [place], [place] [address]   Stool culture     Status: None (Preliminary result)   Collection Time: 10/28/23 11:51 PM   Specimen: Stool  Result Value Ref Range Status   Salmonella/Shigella Screen Final report  Final   Campylobacter Culture PENDING  Incomplete   E coli, Shiga toxin Assay Negative Negative Final    Comment: (NOTE) Performed At: [facility] [address] [place], [place] [id number] Jolene Schimke MD WG:[id number]   STOOL CULTURE REFLEX - RSASHR     Status: None   Collection Time: 10/28/23 11:51 PM  Result Value Ref Range Status   Stool Culture result 1 (RSASHR) Comment  Final    Comment: (NOTE) No Salmonella or Shigella recovered. Performed At: [facility] [address] [place], [place] [id number] Jolene Schimke MD MW:[id number]   C Difficile Quick Screen w PCR reflex     Status: Abnormal   Collection Time: 10/29/23 10:47 AM   Specimen: Stool  Result Value Ref Range Status   C Diff antigen  POSITIVE (A) NEGATIVE Final    Comment: CRITICAL RESULT CALLED TO, READ BACK BY AND VERIFIED WITH: CORN, C. 1401 10/29/2023 LRL    C Diff toxin POSITIVE (A) NEGATIVE Final    Comment: CRITICAL RESULT CALLED TO, READ BACK BY AND VERIFIED WITH: CORN, C. 1401 10/29/2023 LRL    C Diff interpretation Toxin  producing C. difficile detected.  Final    Comment: CRITICAL RESULT CALLED TO, READ BACK BY AND VERIFIED WITH: CORN, C. 1401 10/29/2023 LRL Performed at [facility], [address] [place], [place] [address]   Gastrointestinal Panel by PCR , Stool     Status: None   Collection Time: 10/29/23 12:27 PM   Specimen: Stool  Result Value Ref Range Status   Campylobacter species NOT DETECTED NOT DETECTED Final   Plesimonas shigelloides NOT DETECTED NOT DETECTED Final   Salmonella species NOT DETECTED NOT DETECTED Final   Yersinia enterocolitica NOT DETECTED NOT DETECTED Final   Vibrio species NOT DETECTED NOT DETECTED Final   Vibrio cholerae NOT DETECTED NOT DETECTED Final   Enteroaggregative E coli (EAEC) NOT DETECTED NOT DETECTED Final   Enteropathogenic E coli (EPEC) NOT DETECTED NOT DETECTED Final   Enterotoxigenic E coli (ETEC) NOT DETECTED NOT DETECTED Final   Shiga like toxin producing E coli (STEC) NOT DETECTED NOT DETECTED Final   Shigella/Enteroinvasive E coli (EIEC) NOT DETECTED NOT DETECTED Final   Cryptosporidium NOT DETECTED NOT DETECTED Final   Cyclospora cayetanensis NOT DETECTED NOT DETECTED Final   Entamoeba histolytica NOT DETECTED NOT DETECTED Final   Giardia lamblia NOT DETECTED NOT DETECTED Final   Adenovirus F40/41 NOT DETECTED NOT DETECTED Final   Astrovirus NOT DETECTED NOT DETECTED Final   Norovirus GI/GII NOT DETECTED NOT DETECTED Final   Rotavirus A NOT DETECTED NOT DETECTED Final   Sapovirus (I, II, IV, and V) NOT DETECTED NOT DETECTED Final    Comment: Performed at [facility], [address] [place], [place] [address]    Coagulation  Studies: No results for input(s): "LABPROT", "INR" in the last 72 hours.  Urinalysis: No results for input(s): "COLORURINE", "LABSPEC", "PHURINE", "GLUCOSEU", "HGBUR", "BILIRUBINUR", "KETONESUR", "PROTEINUR", "UROBILINOGEN", "NITRITE", "LEUKOCYTESUR" in the last 72 hours.  Invalid input(s): "APPERANCEUR"    Imaging: No results found.    Medications:    metronidazole 500 mg (11/02/23 0537)    Chlorhexidine Gluconate Cloth  6 each Topical Daily   feeding supplement  1 Container Oral TID BM   heparin injection (subcutaneous)  5,000 Units Subcutaneous Q8H   insulin aspart  0-15 Units Subcutaneous Q4H   levothyroxine  75 mcg Oral Q0600   multivitamin with minerals  1 tablet Oral Daily   sertraline  100 mg Oral Daily   sodium chloride flush  10-40 mL Intracatheter Q12H   sodium chloride flush  3-10 mL Intravenous Q12H   vancomycin  500 mg Oral Q6H   acetaminophen, docusate sodium, glycopyrrolate **OR** glycopyrrolate **OR** glycopyrrolate, haloperidol lactate, LORazepam, morphine injection, ondansetron (ZOFRAN) IV, polyethylene glycol, polyvinyl alcohol, sodium chloride flush, sodium chloride flush  Assessment/ Plan:  [name] is a 89 y.o.  female  with a PMHx of hyperlipidemia, GERD, hypothyroidism, history of pulmonary nodule, restless leg syndrome, breast cancer, hip fracture 8/24, who was admitted to [facility] on 10/28/2023 for Septic shock (HCC) [A41.9, R65.21]   Acute kidney injury/chronic kidney disease stage IIIb baseline creatinine 1.5 with EGFR 33 04/30/2023. Acute kidney injury now secondary to ischemic ATN from hypotension and underlying sepsis.  Renal function continues to decline despite adequate urine output.  Due to patient's health, we are concerned of patient's tolerance to receive renal replacement therapy.  Agree with plan to transition to comfort measures  Lab Results  Component Value Date   CREATININE 5.71 (H) 11/02/2023   CREATININE 5.34 (H) 11/01/2023    CREATININE 4.20 (H) 10/31/2023  Intake/Output Summary (Last 24 hours) at 11/02/2023 1256 Last data filed at 11/02/2023 1106 Gross per 24 hour  Intake 1995.19 ml  Output 1000 ml  Net 995.19 ml    2.  Acute metabolic acidosis.  Serum bicarbonate corrected.   3.Proteus mirabilis UTI.  Continue ceftriaxone.   As updated plan, patient and family have agreed to transition towards comfort measures with discharge to hospice.  We will sign off at this time.     LOS: 5 Kniyah Khun 2/16/202512:56 PM

## 2023-11-02 NOTE — Progress Notes (Signed)
Went to patient room to give report with dat nurse Trinna Post, we both noted blood coming out of her neck, fount out patient has pulled her internal jugular catheter  out, nurse cleaned her up ,and notify attending, Catheter was intact , updated day shift nurse Trinna Post RN, will continue to monitor.

## 2023-11-02 NOTE — Consult Note (Signed)
 Consultation Note Date: 11/02/2023   Patient Name: Jeanne Tran  DOB: March 07, 1934  MRN: [id number]  Age / Sex: 89 y.o., female  PCP: Fabian Sharp Neta Mends, MD Referring Physician: Delfino Lovett, MD  Reason for Consultation: Establishing goals of care   HPI/Brief Hospital Course: 89 y.o. female  with past medical history of CKD stage 3, HLD, hypothyroidism, breast cancer and pulmonary nodule admitted from [facility] on 10/28/2023 post syncopal episode and AMS.  Initially admitted to ICU for septic shock from presumed UTI requiring vasopressor support, found to be C. Diff (+), renal function continues to decline   Palliative medicine was consulted for assisting with goals of care conversations.  Subjective:  Extensive chart review has been completed prior to meeting patient including labs, vital signs, imaging, progress notes, orders, and available advanced directive documents from current and previous encounters.  Secure chat received from primary team that family has opted for comfort measures and are interested in pursing hospice services. Requested to assist with coordinating care.  Visited with Jeanne Tran at bedside. She is awake, alert and able to engage in conversations. No family at bedside during time of visit.  Introduced myself as a Publishing rights manager as a member of the palliative care team. Explained palliative medicine is specialized medical care for people living with serious illness. It focuses on providing relief from the symptoms and stress of a serious illness. The goal is to improve quality of life for both the patient and the family.   Assessed symptoms, she denies acute pain or discomfort. Reports sleeping well and appetite remains poor but attempts to eat at each meal.  Due to current presentation, Jeanne Tran not appropriate for hospice IPU. TOC and HL engaged, updated on assessment, will need hospice services to follow at  facility.  Called and spoke with daughter-Luann, confirms comfort care, discussed next steps regarding connecting with hospice.  Comfort orders placed by primary team.  All questions/concerns addressed. Emotional support provided to patient/family/support persons. PMT will continue to follow and support patient as needed.  Objective: Primary Diagnoses: Present on Admission:  Septic shock [facility])   Physical Exam Constitutional:      General: She is not in acute distress.    Appearance: She is not ill-appearing.  Pulmonary:     Effort: Pulmonary effort is normal. No respiratory distress.  Skin:    General: Skin is warm and dry.  Neurological:     Mental Status: She is alert.     Motor: Weakness present.     Vital Signs: BP (!) 128/95 (BP Location: Right Arm)   Pulse (!) 102   Temp 97.6 F (36.4 C) (Axillary)   Resp 20   Wt 74.3 kg   SpO2 94%   BMI 28.12 kg/m  Pain Scale: 0-10   Pain Score: 0-No pain   IO: Intake/output summary:  Intake/Output Summary (Last 24 hours) at 11/02/2023 1533 Last data filed at 11/02/2023 1106 Gross per 24 hour  Intake 1895.19 ml  Output 1000 ml  Net 895.19 ml    LBM: Last BM Date : 11/02/23 Baseline Weight: Weight: 65 kg Most recent weight: Weight: 74.3 kg       Assessment and Plan  SUMMARY OF RECOMMENDATIONS   Comfort measures-managed by primary team Not appropriate for IPU, will need hospice to follow at facility at discharge  Discussed With: Primary team, TOC and HL   Thank you for this consult and allowing Palliative Medicine to participate in the care of Jeanne E.  Sahm "Libby". Palliative medicine will continue to follow and assist as needed.   Time Total: 55 minutes  Time spent includes: Detailed review of medical records (labs, imaging, vital signs), medically appropriate exam (mental status, respiratory, cardiac, skin), discussed with treatment team, counseling and educating patient, family and staff, documenting  clinical information, medication management and coordination of care.   Signed by: Leeanne Deed, DNP, AGNP-C Palliative Medicine    Please contact Palliative Medicine Team phone at [phone] for questions and concerns.  For individual provider: See Loretha Stapler

## 2023-11-02 NOTE — Progress Notes (Signed)
   11/02/23 1200  Spiritual Encounters  Type of Visit Initial  Care provided to: Patient  Conversation partners present during encounter Nurse  Referral source Physician  Reason for visit Urgent spiritual support  OnCall Visit Yes   Chaplain responded to [facility] consult for patient recently placed on comfort care. Patient presently being tended to by care team. No family present at this time. Chaplain will monitor situation.

## 2023-11-02 NOTE — IPAL (Signed)
  Interdisciplinary Goals of Care Family Meeting   Date carried out: 11/02/2023  Location of the meeting: Bedside  Member's involved: Physician and Family Member or next of kin  Durable Power of Attorney or acting medical decision maker: Son, daughter and other family numbers at bedside  Discussion: We discussed goals of care for [employer]   Code status:   Code Status: Do not attempt resuscitation (DNR) - Comfort care   Disposition: Home with Hospice versus hospice home  Time spent for the meeting: 35 minutes    Delfino Lovett, MD  11/02/2023, 12:12 PM

## 2023-11-02 NOTE — TOC Progression Note (Addendum)
Transition of Care [facility]) - Progression Note    Patient Details  Name: Jeanne Tran MRN: [id number] Date of Birth: May 13, 1934  [facility]) CM/SW Contact  Liliana Cline, LCSW Phone Number: 11/02/2023, 11:18 AM  Clinical Narrative:    Notified by MD family chose comfort care - need to determine hospice home versus SNF with hospice - Palliative NP to follow up. Left VM for Lowella Bandy at [facility] requesting return call with which agency they use for hospice.  1:22- Correction per team patient's family confirmed she is from [facility] not [facility] [employer].  Per [place] [facility] [place], they use [facility] for Hospice. Ree Kida with [facility] is following up.   Expected Discharge Plan: Skilled Nursing Facility    Expected Discharge Plan and Services                                               Social Determinants of Health (SDOH) Interventions SDOH Screenings   Food Insecurity: No Food Insecurity (11/01/2023)  Housing: Low Risk  (11/01/2023)  Transportation Needs: No Transportation Needs (11/01/2023)  Utilities: Not At Risk (11/01/2023)  Alcohol Screen: Low Risk  (12/14/2021)  Depression (PHQ2-9): Medium Risk (08/13/2023)  Financial Resource Strain: Low Risk  (01/16/2022)  Physical Activity: Inactive (12/14/2021)  Social Connections: Socially Isolated (11/01/2023)  Stress: No Stress Concern Present (12/14/2021)  Tobacco Use: Medium Risk (10/28/2023)    Readmission Risk Interventions    05/01/2023   11:11 AM  Readmission Risk Prevention Plan  Transportation Screening Complete  PCP or Specialist Appt within 5-7 Days Complete  Home Care Screening Complete  Medication Review (RN CM) Complete

## 2023-11-03 DIAGNOSIS — R6521 Severe sepsis with septic shock: Secondary | ICD-10-CM | POA: Diagnosis not present

## 2023-11-03 DIAGNOSIS — A419 Sepsis, unspecified organism: Secondary | ICD-10-CM | POA: Diagnosis not present

## 2023-11-03 DIAGNOSIS — Z515 Encounter for palliative care: Secondary | ICD-10-CM | POA: Diagnosis not present

## 2023-11-03 DIAGNOSIS — A0472 Enterocolitis due to Clostridium difficile, not specified as recurrent: Secondary | ICD-10-CM | POA: Diagnosis not present

## 2023-11-03 LAB — STOOL CULTURE: E coli, Shiga toxin Assay: NEGATIVE

## 2023-11-03 LAB — STOOL CULTURE REFLEX - CMPCXR

## 2023-11-03 LAB — STOOL CULTURE REFLEX - RSASHR

## 2023-11-03 NOTE — [place] FL2 (Signed)
[place] MEDICAID FL2 LEVEL OF CARE FORM     IDENTIFICATION  Patient Name: Jeanne Tran Birthdate: 1934/03/08 Sex: female Admission Date (Current Location): 10/28/2023  [facility] and [place] Number:  Chiropodist and Address:  [facility], [address] [place], [place] [address]      Provider Number: [id number]  Attending Physician Name and Address:  Delfino Lovett, MD  Relative Name and Phone Number:  Lorrine Kin [phone])  [phone] (Work Phone)    Current Level of Care: Hospital Recommended Level of Care: Skilled Nursing Facility Prior Approval Number:    Date Approved/Denied:   PASRR Number: [id number]  Discharge Plan: Home    Current Diagnoses: Patient Active Problem List   Diagnosis Date Noted   C. difficile colitis 10/29/2023   Septic shock (HCC) 10/28/2023   Malnutrition of moderate degree 04/30/2023   Closed left hip fracture, initial encounter (HCC) 04/26/2023   Compression fracture of L1 lumbar vertebra (HCC) 04/26/2023   Rhabdomyolysis 04/26/2023   Hypothyroidism 04/26/2023   CKD (chronic kidney disease) stage 4, GFR 15-29 ml/min (HCC) 04/26/2023   Hyperglycemia 04/26/2023   Rheumatoid arthritis (HCC) 04/30/2020   Osteopenia 10/01/2016   Ductal carcinoma in situ (DCIS) of left breast 05/09/2015   Multiple joint pain 10/18/2014   GI symptoms 10/18/2014   Hyperlipidemia 10/18/2014   Chronic renal insufficiency 09/26/2014   Swelling of both hands 09/26/2014   Elevated serum creatinine 08/14/2014   Hospice care 08/14/2014   Dry skin dermatitis 05/18/2013   Medicare annual wellness visit, subsequent 04/27/2012   Abnormal TSH 04/27/2012   High risk medication use 10/20/2011   History of ERCP    TINGLING 03/06/2010   Osteoarthritis 10/27/2009   FECAL OCCULT BLOOD 02/21/2009   ESOPHAGEAL STRICTURE 08/04/2008   DIVERTICULOSIS OF COLON 08/04/2008   IRRITABLE BOWEL SYNDROME 08/04/2008   HIP PAIN, RIGHT 07/06/2008    NECK PAIN 07/06/2008   BACK PAIN, RIGHT 07/06/2008   HEADACHE 07/06/2008   ADJUSTMENT DISORDER WITH ANXIOUS MOOD 08/31/2007   HYPERLIPIDEMIA 06/01/2007   INSOMNIA, CHRONIC 06/01/2007   GERD 05/28/2007   BREAST CANCER, HX OF 05/28/2007   History of colonic polyps 05/28/2007    Orientation RESPIRATION BLADDER Height & Weight     Self, Place  O2 (023L per nasal cannula) Incontinent, External catheter (Foley) Weight: 74.3 kg Height:     BEHAVIORAL SYMPTOMS/MOOD NEUROLOGICAL BOWEL NUTRITION STATUS  Other (Comment) (n/a)   Incontinent (rectal tube) Diet (clear liquid)  AMBULATORY STATUS COMMUNICATION OF NEEDS Skin   Total Care Verbally Other (Comment) (Eccyhmosis bilateral knee, erythema bilateral arm)                       Personal Care Assistance Level of Assistance  Total care       Total Care Assistance: Maximum assistance   Functional Limitations Info             SPECIAL CARE FACTORS FREQUENCY                       Contractures Contractures Info: Not present    Additional Factors Info  Code Status, Allergies Code Status Info: DNR Allergies Info: Codeine, Aspirin           Current Medications (11/03/2023):  This is the current hospital active medication list Current Facility-Administered Medications  Medication Dose Route Frequency Provider Last Rate Last Admin   acetaminophen (TYLENOL) tablet 650 mg  650 mg Oral Q4H PRN  Dahlia Byes, NP       Chlorhexidine Gluconate Cloth 2 % PADS 6 each  6 each Topical Daily Dahlia Byes, NP   6 each at 11/02/23 1037   docusate sodium (COLACE) capsule 100 mg  100 mg Oral BID PRN Dahlia Byes, NP       feeding supplement (BOOST / RESOURCE BREEZE) liquid 1 Container  1 Container Oral TID BM Janann Colonel, MD   1 Container at 11/02/23 1440   glycopyrrolate (ROBINUL) tablet 1 mg  1 mg Oral Q4H PRN Delfino Lovett, MD       Or   glycopyrrolate (ROBINUL) injection 0.2 mg  0.2 mg Subcutaneous Q4H PRN Delfino Lovett, MD       Or   glycopyrrolate (ROBINUL) injection 0.2 mg  0.2 mg Intravenous Q4H PRN Delfino Lovett, MD       haloperidol lactate (HALDOL) injection 2.5 mg  2.5 mg Intravenous Q4H PRN Delfino Lovett, MD       insulin aspart (novoLOG) injection 0-15 Units  0-15 Units Subcutaneous Q4H Dahlia Byes, NP   2 Units at 11/01/23 2147   levothyroxine (SYNTHROID) tablet 75 mcg  75 mcg Oral Q0600 Janann Colonel, MD   75 mcg at 11/02/23 0538   LORazepam (ATIVAN) injection 0.5 mg  0.5 mg Intravenous Q4H PRN Delfino Lovett, MD   0.5 mg at 11/02/23 2351   morphine (PF) 2 MG/ML injection 0.5 mg  0.5 mg Intravenous Q4H PRN Delfino Lovett, MD   0.5 mg at 11/02/23 2352   multivitamin with minerals tablet 1 tablet  1 tablet Oral Daily Assaker, [place], MD   1 tablet at 11/02/23 1037   ondansetron (ZOFRAN) injection 4 mg  4 mg Intravenous Q6H PRN Dahlia Byes, NP   4 mg at 10/31/23 1814   polyethylene glycol (MIRALAX / GLYCOLAX) packet 17 g  17 g Oral Daily PRN Dahlia Byes, NP       polyvinyl alcohol (LIQUIFILM TEARS) 1.4 % ophthalmic solution 1 drop  1 drop Both Eyes QID PRN Delfino Lovett, MD       sertraline (ZOLOFT) tablet 100 mg  100 mg Oral Daily Assaker, [place], MD   100 mg at 11/02/23 1037   sodium chloride flush (NS) 0.9 % injection 10-40 mL  10-40 mL Intracatheter Q12H Dahlia Byes, NP   10 mL at 11/02/23 1038   sodium chloride flush (NS) 0.9 % injection 10-40 mL  10-40 mL Intracatheter PRN Dahlia Byes, NP       sodium chloride flush (NS) 0.9 % injection 3-10 mL  3-10 mL Intravenous Q12H Sherryll Burger, Vipul, MD   10 mL at 11/02/23 1250   sodium chloride flush (NS) 0.9 % injection 3-10 mL  3-10 mL Intravenous PRN Delfino Lovett, MD         Discharge Medications: Please see discharge summary for a list of discharge medications.  Relevant Imaging Results:  Relevant Lab Results:   Additional Information [id number]  Truddie Hidden, RN

## 2023-11-03 NOTE — Plan of Care (Signed)
  Problem: Education: Goal: Knowledge of General Education information will improve Description: Including pain rating scale, medication(s)/side effects and non-pharmacologic comfort measures Outcome: Progressing   Problem: Health Behavior/Discharge Planning: Goal: Ability to manage health-related needs will improve Outcome: Progressing   Problem: Clinical Measurements: Goal: Ability to maintain clinical measurements within normal limits will improve Outcome: Progressing Goal: Will remain free from infection Outcome: Progressing Goal: Diagnostic test results will improve Outcome: Progressing Goal: Respiratory complications will improve Outcome: Progressing Goal: Cardiovascular complication will be avoided Outcome: Progressing   Problem: Activity: Goal: Risk for activity intolerance will decrease Outcome: Progressing   Problem: Nutrition: Goal: Adequate nutrition will be maintained Outcome: Progressing   Problem: Coping: Goal: Level of anxiety will decrease Outcome: Progressing   Problem: Elimination: Goal: Will not experience complications related to bowel motility Outcome: Progressing Goal: Will not experience complications related to urinary retention Outcome: Progressing   Problem: Pain Managment: Goal: General experience of comfort will improve and/or be controlled Outcome: Progressing   Problem: Safety: Goal: Ability to remain free from injury will improve Outcome: Progressing   Problem: Skin Integrity: Goal: Risk for impaired skin integrity will decrease Outcome: Progressing   Problem: Education: Goal: Ability to describe self-care measures that may prevent or decrease complications (Diabetes Survival Skills Education) will improve Outcome: Progressing Goal: Individualized Educational Video(s) Outcome: Progressing   Problem: Coping: Goal: Ability to adjust to condition or change in health will improve Outcome: Progressing   Problem: Fluid  Volume: Goal: Ability to maintain a balanced intake and output will improve Outcome: Progressing   Problem: Health Behavior/Discharge Planning: Goal: Ability to identify and utilize available resources and services will improve Outcome: Progressing Goal: Ability to manage health-related needs will improve Outcome: Progressing   Problem: Metabolic: Goal: Ability to maintain appropriate glucose levels will improve Outcome: Progressing   Problem: Nutritional: Goal: Maintenance of adequate nutrition will improve Outcome: Progressing Goal: Progress toward achieving an optimal weight will improve Outcome: Progressing   Problem: Skin Integrity: Goal: Risk for impaired skin integrity will decrease Outcome: Progressing   Problem: Tissue Perfusion: Goal: Adequacy of tissue perfusion will improve Outcome: Progressing   Problem: Education: Goal: Knowledge of risk factors and measures for prevention of condition will improve Outcome: Progressing   Problem: Coping: Goal: Psychosocial and spiritual needs will be supported Outcome: Progressing   Problem: Respiratory: Goal: Will maintain a patent airway Outcome: Progressing Goal: Complications related to the disease process, condition or treatment will be avoided or minimized Outcome: Progressing

## 2023-11-03 NOTE — Progress Notes (Signed)
1      PROGRESS NOTE    Jeanne Tran  WUJ:[id number] DOB: 09/04/1934 DOA: 10/28/2023 PCP: Madelin Headings, MD    Brief Narrative:   89 year old with a PMH significant for HLD, GERD, CKD stage 3, Hypothyroidism, Pulmonary nodule, RLS, Breast cancer, Hip fracture 04/2023 that presented to the ER 10/28/2023 with AMS.  She was admitted to ICU for septic shock  2/11: Patient admitted with septic shock, likely due to UTI. Levophed/vasopressin. Bicarbonate drip/bolus's. Antibiotics, cultures, stress dose steroids.  02/12: C.diff +. Started on Flagyl and PO vanco. Not a candidate for Surgery.  02/13: Hemodynamically improving. Mental status improved.  2/14: Transfer out of ICU 2/15: TRH picked up.  Kidney function continues to get worse 2/16: Hospice screening 2/17: back to facility with Hospice  Assessment & Plan:   Principal Problem:   Septic shock (HCC) Active Problems:   Hospice care   C. difficile colitis   # Shock likely distributive/septic due to UTI in the setting of (Resolved) -urine culture growing Proteus Mirabilis.  Plan for comfort care #C.diff colitis (Fulminant C.diff) not a candidate for surgical intervention.  Diarrhea improving # Lactic acidosis with downtrending lactate currently at 4.0 --> 3.4. secondary to the above.  Will order lactic acid trends # AKI with CKD stage IIIb creatinine 2.2 mg/dL from a baseline of 1.5 mg/dL likely ATN in the setting of sepsis.Worsening currently at 5.71 mg/dl.  Not a acute indication for dialysis yet as patient is making urine and not quite uremic.  She is likely not a good candidate for dialysis either based on my discussion with nephrology.  Family chose comfort care/hospice # Hepatitis with elevated AST at 1400 and elevated ALT at 900 likely in the setting of shock liver downtrending.  Monitor # COVID-19 positive on 10/28/2023  # Hypothyroidism: Continue levothyroxine # Goals of care: Family shared that patient has had significant  clinical and functional decline last 6 months.  She is not ambulating much at [facility] since her hip fracture.  Family chose comfort care/hosp[facility] at [facility].  Overall poor prognosis  DVT prophylaxis:  SCDs Start: 10/28/23 2318     Code Status: DNR Family Communication: Updated son, daughter and other family was at bedside Disposition Plan: discharge tomorrow back to [facility] with hospice   Consultants:  Nephrology Palliative care   Subjective:  Obtunded, comfortable  Objective: Vitals:   11/02/23 1257 11/02/23 1631 11/02/23 1949 11/03/23 0840  BP: (!) 128/95 (!) 119/92 110/70 103/73  Pulse: (!) 102 84 80 94  Resp: 20 16 19    Temp: 97.6 F (36.4 C) 97.9 F (36.6 C) 97.8 F (36.6 C) 98.8 F (37.1 C)  TempSrc: Axillary  Oral Oral  SpO2: 94% 92% 94% 92%  Weight:        Intake/Output Summary (Last 24 hours) at 11/03/2023 1832 Last data filed at 11/03/2023 1809 Gross per 24 hour  Intake --  Output 1150 ml  Net -1150 ml   Filed Weights   10/30/23 0500 10/31/23 0454 11/01/23 0735  Weight: 65.9 kg 66.3 kg 74.3 kg    Examination:  General exam: Cachectic looking, chronically ill and frail appearing Respiratory system: Clear to auscultation. Respiratory effort normal. Cardiovascular system: S1 & S2 heard, RRR.  Gastrointestinal system: Abdomen is soft, benign Central nervous system: nonfocal, obtunded Skin: No rashes, lesions or ulcers     Data Reviewed: I have personally reviewed following labs and imaging studies  CBC: Recent Labs  Lab  10/28/23 1938 10/29/23 0133 10/29/23 1406 10/30/23 0456 10/31/23 0353 11/01/23 0638 11/02/23 0500  WBC 15.2*   < > 36.5* 31.2* 25.3* 25.3* 27.1*  NEUTROABS 6.3  --   --   --   --   --   --   HGB 10.7*   < > 11.3* 9.9* 8.9* 9.0* 9.2*  HCT 35.4*   < > 33.5* 29.3* 25.1* 26.0* 25.9*  MCV 109.6*   < > 98.2 96.4 93.0 94.5 93.5  PLT 190   < > 143* 147* 109* 122* 120*   < > = values in this interval not displayed.    Basic Metabolic Panel: Recent Labs  Lab 10/29/23 0426 10/29/23 1227 10/30/23 0456 10/31/23 0353 11/01/23 0638 11/02/23 0500  NA 136 135 132* 131* 134* 136  K 4.3 3.8 4.3 4.5 3.7 3.5  CL 105 101 99 97* 99 95*  CO2 15* 17* 19* 19* 22 24  GLUCOSE 163* 86 180* 118* 131* 136*  BUN 47* 53* 61* 73* 83* 86*  CREATININE 1.99* 2.28* 3.09* 4.20* 5.34* 5.71*  CALCIUM 8.0* 8.2* 8.0* 7.9* 8.2* 8.1*  MG 1.9  --  1.5* 2.3 2.2 2.3  PHOS 6.3*  --  4.9* 5.1* 5.8* 5.0*   GFR: Estimated Creatinine Clearance: 6.7 mL/min (A) (by C-G formula based on SCr of 5.71 mg/dL (H)). Liver Function Tests: Recent Labs  Lab 10/28/23 1938 10/29/23 1227 10/30/23 0456 11/02/23 0500  AST 110* 1,486* 642* 56*  ALT 57* 908* 599* 178*  ALKPHOS 100 190* 139* 107  BILITOT 0.6 0.8 0.9 0.7  PROT 5.0* 4.6* 5.0* 4.8*  ALBUMIN 2.5* 2.4* 2.5* 2.6*   No results for input(s): "LIPASE", "AMYLASE" in the last 168 hours. No results for input(s): "AMMONIA" in the last 168 hours. Coagulation Profile: Recent Labs  Lab 10/28/23 1938 10/29/23 0133 10/29/23 0426  INR 1.4* 2.3* 2.4*    CBG: Recent Labs  Lab 11/01/23 2353 11/02/23 0453 11/02/23 0822 11/02/23 1233 11/02/23 1627  GLUCAP 108* 115* 116* 131* 145*    Sepsis Labs: Recent Labs  Lab 10/29/23 0133 10/29/23 0427 10/29/23 1454 10/30/23 0456 11/01/23 1459 11/01/23 1533  PROCALCITON 0.84  --   --   --   --   --   LATICACIDVEN 5.1*   < > 3.4* 3.4* 1.7 1.8   < > = values in this interval not displayed.    Recent Results (from the past 240 hours)  Blood Culture (routine x 2)     Status: None   Collection Time: 10/28/23  7:38 PM   Specimen: BLOOD RIGHT WRIST  Result Value Ref Range Status   Specimen Description BLOOD RIGHT WRIST  Final   Special Requests   Final    BOTTLES DRAWN AEROBIC AND ANAEROBIC Blood Culture results may not be optimal due to an inadequate volume of blood received in culture bottles   Culture   Final    NO GROWTH 5  DAYS Performed at [facility], [address] [place], [place] [address]    Report Status 11/02/2023 FINAL  Final  Blood Culture (routine x 2)     Status: None   Collection Time: 10/28/23  7:45 PM   Specimen: Right Antecubital; Blood  Result Value Ref Range Status   Specimen Description RIGHT ANTECUBITAL  Final   Special Requests   Final    BOTTLES DRAWN AEROBIC AND ANAEROBIC Blood Culture results may not be optimal due to an inadequate volume of blood received in culture bottles  Culture   Final    NO GROWTH 5 DAYS Performed at [facility], [address] [place], [place] [address]    Report Status 11/02/2023 FINAL  Final  Urine Culture     Status: Abnormal   Collection Time: 10/28/23  8:20 PM   Specimen: Urine, Random  Result Value Ref Range Status   Specimen Description   Final    URINE, RANDOM Performed at [facility], [address] [place], [place] [address]    Special Requests   Final    NONE Reflexed from [phone] Performed at [facility], [address] [place], [place] [address]    Culture 20,000 COLONIES/mL PROTEUS MIRABILIS (A)  Final   Report Status 10/30/2023 FINAL  Final   Organism ID, Bacteria PROTEUS MIRABILIS (A)  Final      Susceptibility   Proteus mirabilis - MIC*    AMPICILLIN <=2 SENSITIVE Sensitive     CEFAZOLIN <=4 SENSITIVE Sensitive     CEFEPIME <=0.12 SENSITIVE Sensitive     CEFTRIAXONE <=0.25 SENSITIVE Sensitive     CIPROFLOXACIN <=0.25 SENSITIVE Sensitive     GENTAMICIN <=1 SENSITIVE Sensitive     IMIPENEM 1 SENSITIVE Sensitive     NITROFURANTOIN >=512 RESISTANT Resistant     TRIMETH/SULFA <=20 SENSITIVE Sensitive     AMPICILLIN/SULBACTAM <=2 SENSITIVE Sensitive     PIP/TAZO <=4 SENSITIVE Sensitive ug/mL    * 20,000 COLONIES/mL PROTEUS MIRABILIS  Resp panel by RT-PCR (RSV, Flu A&B, Covid) Anterior Nasal Swab     Status: Abnormal   Collection Time: 10/28/23  9:58 PM   Specimen: Anterior Nasal  Swab  Result Value Ref Range Status   SARS Coronavirus 2 by RT PCR POSITIVE (A) NEGATIVE Final    Comment: (NOTE) SARS-CoV-2 target nucleic acids are DETECTED.  The SARS-CoV-2 RNA is generally detectable in upper respiratory specimens during the acute phase of infection. Positive results are indicative of the presence of the identified virus, but do not rule out bacterial infection or co-infection with other pathogens not detected by the test. Clinical correlation with patient history and other diagnostic information is necessary to determine patient infection status. The expected result is Negative.  Fact Sheet for Patients: BloggerCourse.com  Fact Sheet for Healthcare Providers: SeriousBroker.it  This test is not yet approved or cleared by the [place] FDA and  has been authorized for detection and/or diagnosis of SARS-CoV-2 by FDA under an Emergency Use Authorization (EUA).  This EUA will remain in effect (meaning this test can be used) for the duration of  the COVID-19 declaration under Section 564(b)(1) of the A ct, 21 U.S.C. section 360bbb-3(b)(1), unless the authorization is terminated or revoked sooner.     Influenza A by PCR NEGATIVE NEGATIVE Final   Influenza B by PCR NEGATIVE NEGATIVE Final    Comment: (NOTE) The Xpert Xpress SARS-CoV-2/FLU/RSV plus assay is intended as an aid in the diagnosis of influenza from Nasopharyngeal swab specimens and should not be used as a sole basis for treatment. Nasal washings and aspirates are unacceptable for Xpert Xpress SARS-CoV-2/FLU/RSV testing.  Fact Sheet for Patients: BloggerCourse.com  Fact Sheet for Healthcare Providers: SeriousBroker.it  This test is not yet approved or cleared by the [place] FDA and has been authorized for detection and/or diagnosis of SARS-CoV-2 by FDA under an Emergency Use Authorization  (EUA). This EUA will remain in effect (meaning this test can be used) for the duration of the COVID-19 declaration under Section 564(b)(1) of the Act,  21 U.S.C. section 360bbb-3(b)(1), unless the authorization is terminated or revoked.     Resp Syncytial Virus by PCR NEGATIVE NEGATIVE Final    Comment: (NOTE) Fact Sheet for Patients: BloggerCourse.com  Fact Sheet for Healthcare Providers: SeriousBroker.it  This test is not yet approved or cleared by the [place] FDA and has been authorized for detection and/or diagnosis of SARS-CoV-2 by FDA under an Emergency Use Authorization (EUA). This EUA will remain in effect (meaning this test can be used) for the duration of the COVID-19 declaration under Section 564(b)(1) of the Act, 21 U.S.C. section 360bbb-3(b)(1), unless the authorization is terminated or revoked.  Performed at [facility], [address] [place], [place] [address]   MRSA Next Gen by PCR, Nasal     Status: None   Collection Time: 10/28/23 11:16 PM   Specimen: Nasal Mucosa; Nasal Swab  Result Value Ref Range Status   MRSA by PCR Next Gen NOT DETECTED NOT DETECTED Final    Comment: (NOTE) The GeneXpert MRSA Assay (FDA approved for NASAL specimens only), is one component of a comprehensive MRSA colonization surveillance program. It is not intended to diagnose MRSA infection nor to guide or monitor treatment for MRSA infections. Test performance is not FDA approved in patients less than 30 years old. Performed at [facility], [address] [place], [place] [address]   Stool culture     Status: None   Collection Time: 10/28/23 11:51 PM   Specimen: Stool  Result Value Ref Range Status   Salmonella/Shigella Screen Final report  Final   Campylobacter Culture Final report  Final   E coli, Shiga toxin Assay Negative Negative Final    Comment: (NOTE) Performed At: [facility] [address] [place], [place] [id number] Jolene Schimke MD [id number]   STOOL CULTURE REFLEX - RSASHR     Status: None   Collection Time: 10/28/23 11:51 PM  Result Value Ref Range Status   Stool Culture result 1 (RSASHR) Comment  Final    Comment: (NOTE) No Salmonella or Shigella recovered. Performed At: [facility] [address] [place], [place] [id number] Jolene Schimke MD YS:[id number]   STOOL CULTURE Reflex - CMPCXR     Status: None   Collection Time: 10/28/23 11:51 PM  Result Value Ref Range Status   Stool Culture result 1 (CMPCXR) Comment  Final    Comment: (NOTE) No Campylobacter species isolated. Performed At: [facility] [address] [place], [place] [id number] Jolene Schimke MD [id number]   C Difficile Quick Screen w PCR reflex     Status: Abnormal   Collection Time: 10/29/23 10:47 AM   Specimen: Stool  Result Value Ref Range Status   C Diff antigen POSITIVE (A) NEGATIVE Final    Comment: CRITICAL RESULT CALLED TO, READ BACK BY AND VERIFIED WITH: CORN, C. 1401 10/29/2023 LRL    C Diff toxin POSITIVE (A) NEGATIVE Final    Comment: CRITICAL RESULT CALLED TO, READ BACK BY AND VERIFIED WITH: CORN, C. 1401 10/29/2023 LRL    C Diff interpretation Toxin producing C. difficile detected.  Final    Comment: CRITICAL RESULT CALLED TO, READ BACK BY AND VERIFIED WITH: CORN, C. 1401 10/29/2023 LRL Performed at [facility], [address] [place], [place] [address]   Gastrointestinal Panel by PCR , Stool     Status: None   Collection Time: 10/29/23 12:27 PM   Specimen: Stool  Result Value Ref Range Status   Campylobacter species NOT DETECTED NOT  DETECTED Final   Plesimonas shigelloides NOT DETECTED NOT DETECTED Final   Salmonella species NOT DETECTED NOT DETECTED Final   Yersinia enterocolitica NOT DETECTED NOT DETECTED Final   Vibrio species NOT DETECTED NOT DETECTED Final   Vibrio cholerae NOT DETECTED NOT DETECTED Final    Enteroaggregative E coli (EAEC) NOT DETECTED NOT DETECTED Final   Enteropathogenic E coli (EPEC) NOT DETECTED NOT DETECTED Final   Enterotoxigenic E coli (ETEC) NOT DETECTED NOT DETECTED Final   Shiga like toxin producing E coli (STEC) NOT DETECTED NOT DETECTED Final   Shigella/Enteroinvasive E coli (EIEC) NOT DETECTED NOT DETECTED Final   Cryptosporidium NOT DETECTED NOT DETECTED Final   Cyclospora cayetanensis NOT DETECTED NOT DETECTED Final   Entamoeba histolytica NOT DETECTED NOT DETECTED Final   Giardia lamblia NOT DETECTED NOT DETECTED Final   Adenovirus F40/41 NOT DETECTED NOT DETECTED Final   Astrovirus NOT DETECTED NOT DETECTED Final   Norovirus GI/GII NOT DETECTED NOT DETECTED Final   Rotavirus A NOT DETECTED NOT DETECTED Final   Sapovirus (I, II, IV, and V) NOT DETECTED NOT DETECTED Final    Comment: Performed at [facility], [address] [place], [place] [address]         Radiology Studies: No results found.       Scheduled Meds:  Chlorhexidine Gluconate Cloth  6 each Topical Daily   feeding supplement  1 Container Oral TID BM   insulin aspart  0-15 Units Subcutaneous Q4H   levothyroxine  75 mcg Oral Q0600   multivitamin with minerals  1 tablet Oral Daily   sertraline  100 mg Oral Daily   sodium chloride flush  10-40 mL Intracatheter Q12H   sodium chloride flush  3-10 mL Intravenous Q12H   Continuous Infusions:     LOS: 6 days    Time spent: 35 minutes    Delfino Lovett, MD [facility] Pager [phone]  If 7PM-7AM, please contact night-coverage [email or web address]  11/03/2023, 6:32 PM

## 2023-11-03 NOTE — TOC Progression Note (Addendum)
Transition of Care [facility]) - Progression Note    Patient Details  Name: Jeanne Tran MRN: [id number] Date of Birth: Sep 30, 1933  Transition of [facility] CM/SW Contact  Truddie Hidden, RN Phone Number: 11/03/2023, 9:42 AM  Clinical Narrative:    Attempt to reach [facility] at [facility] regarding discharge. No answer. Left a message.  FL2 completed.     Expected Discharge Plan: Skilled Nursing Facility    Expected Discharge Plan and Services                                               Social Determinants of Health (SDOH) Interventions SDOH Screenings   Food Insecurity: No Food Insecurity (11/01/2023)  Housing: Low Risk  (11/01/2023)  Transportation Needs: No Transportation Needs (11/01/2023)  Utilities: Not At Risk (11/01/2023)  Alcohol Screen: Low Risk  (12/14/2021)  Depression (PHQ2-9): Medium Risk (08/13/2023)  Financial Resource Strain: Low Risk  (01/16/2022)  Physical Activity: Inactive (12/14/2021)  Social Connections: Socially Isolated (11/01/2023)  Stress: No Stress Concern Present (12/14/2021)  Tobacco Use: Medium Risk (10/28/2023)    Readmission Risk Interventions    05/01/2023   11:11 AM  Readmission Risk Prevention Plan  Transportation Screening Complete  PCP or Specialist Appt within 5-7 Days Complete  Home Care Screening Complete  Medication Review (RN CM) Complete

## 2023-11-03 NOTE — Care Management Important Message (Signed)
Important Message  Patient Details  Name: Jeanne Tran MRN: [id number] Date of Birth: May 24, 1934   Important Message Given:  Yes - Medicare IM     Cristela Blue, CMA 11/03/2023, 10:15 AM

## 2023-11-04 DIAGNOSIS — A0472 Enterocolitis due to Clostridium difficile, not specified as recurrent: Secondary | ICD-10-CM | POA: Diagnosis not present

## 2023-11-04 DIAGNOSIS — A419 Sepsis, unspecified organism: Secondary | ICD-10-CM | POA: Diagnosis not present

## 2023-11-04 DIAGNOSIS — Z515 Encounter for palliative care: Secondary | ICD-10-CM | POA: Diagnosis not present

## 2023-11-04 DIAGNOSIS — R6521 Severe sepsis with septic shock: Secondary | ICD-10-CM | POA: Diagnosis not present

## 2023-11-04 NOTE — [place] FL2 (Signed)
[place] MEDICAID FL2 LEVEL OF CARE FORM     IDENTIFICATION  Patient Name: Jeanne Tran Birthdate: 1934/04/05 Sex: female Admission Date (Current Location): 10/28/2023  [facility] and [place] Number:  Chiropodist and Address:  [facility], [address], [place], [place] [address]      Provider Number: [id number]  Attending Physician Name and Address:  Delfino Lovett, MD  Relative Name and Phone Number:  Terie Purser)  [phone] (Work Phone)    Current Level of Care: Hospital Recommended Level of Care: Skilled Nursing Facility (with hospice through [employer]) Prior Approval Number:    Date Approved/Denied:   PASRR Number: [id number]  Discharge Plan: SNF (with hospice through [facility])    Current Diagnoses: Patient Active Problem List   Diagnosis Date Noted   C. difficile colitis 10/29/2023   Septic shock (HCC) 10/28/2023   Malnutrition of moderate degree 04/30/2023   Closed left hip fracture, initial encounter (HCC) 04/26/2023   Compression fracture of L1 lumbar vertebra (HCC) 04/26/2023   Rhabdomyolysis 04/26/2023   Hypothyroidism 04/26/2023   CKD (chronic kidney disease) stage 4, GFR 15-29 ml/min (HCC) 04/26/2023   Hyperglycemia 04/26/2023   Rheumatoid arthritis (HCC) 04/30/2020   Osteopenia 10/01/2016   Ductal carcinoma in situ (DCIS) of left breast 05/09/2015   Multiple joint pain 10/18/2014   GI symptoms 10/18/2014   Hyperlipidemia 10/18/2014   Chronic renal insufficiency 09/26/2014   Swelling of both hands 09/26/2014   Elevated serum creatinine 08/14/2014   Hospice care 08/14/2014   Dry skin dermatitis 05/18/2013   Medicare annual wellness visit, subsequent 04/27/2012   Abnormal TSH 04/27/2012   High risk medication use 10/20/2011   History of ERCP    TINGLING 03/06/2010   Osteoarthritis 10/27/2009   FECAL OCCULT BLOOD 02/21/2009   ESOPHAGEAL STRICTURE 08/04/2008   DIVERTICULOSIS OF COLON 08/04/2008    IRRITABLE BOWEL SYNDROME 08/04/2008   HIP PAIN, RIGHT 07/06/2008   NECK PAIN 07/06/2008   BACK PAIN, RIGHT 07/06/2008   HEADACHE 07/06/2008   ADJUSTMENT DISORDER WITH ANXIOUS MOOD 08/31/2007   HYPERLIPIDEMIA 06/01/2007   INSOMNIA, CHRONIC 06/01/2007   GERD 05/28/2007   BREAST CANCER, HX OF 05/28/2007   History of colonic polyps 05/28/2007    Orientation RESPIRATION BLADDER Height & Weight     Self, Situation, Place  O2 (Nasal Cannula 3 L) Incontinent, Indwelling catheter Weight: 161 lb 2.5 oz (73.1 kg) Height:     BEHAVIORAL SYMPTOMS/MOOD NEUROLOGICAL BOWEL NUTRITION STATUS   (None)  (None) Incontinent Diet (Carb modified)  AMBULATORY STATUS COMMUNICATION OF NEEDS Skin   Extensive Assist Verbally Bruising, Other (Comment) (Erythema/redness.)                       Personal Care Assistance Level of Assistance  Bathing, Feeding, Dressing Bathing Assistance: Maximum assistance Feeding assistance: Limited assistance Dressing Assistance: Maximum assistance   Functional Limitations Info  Sight, Hearing, Speech Sight Info: Adequate Hearing Info: Adequate Speech Info: Adequate    SPECIAL CARE FACTORS FREQUENCY                       Contractures Contractures Info: Not present    Additional Factors Info  Code Status, Allergies, Isolation Precautions Code Status Info: DNR Allergies Info: Codeine, Aspirin     Isolation Precautions Info: Enteric. Covid positive on admission (2/11)     Current Medications (11/04/2023):  This is the current hospital active medication list Current Facility-Administered Medications  Medication Dose  Route Frequency Provider Last Rate Last Admin   acetaminophen (TYLENOL) tablet 650 mg  650 mg Oral Q4H PRN Dahlia Byes, NP       Chlorhexidine Gluconate Cloth 2 % PADS 6 each  6 each Topical Daily Dahlia Byes, NP   6 each at 11/03/23 1502   docusate sodium (COLACE) capsule 100 mg  100 mg Oral BID PRN Dahlia Byes, NP        feeding supplement (BOOST / RESOURCE BREEZE) liquid 1 Container  1 Container Oral TID BM Janann Colonel, MD   1 Container at 11/03/23 2101   glycopyrrolate (ROBINUL) tablet 1 mg  1 mg Oral Q4H PRN Delfino Lovett, MD       Or   glycopyrrolate (ROBINUL) injection 0.2 mg  0.2 mg Subcutaneous Q4H PRN Delfino Lovett, MD       Or   glycopyrrolate (ROBINUL) injection 0.2 mg  0.2 mg Intravenous Q4H PRN Delfino Lovett, MD       haloperidol lactate (HALDOL) injection 2.5 mg  2.5 mg Intravenous Q4H PRN Delfino Lovett, MD       insulin aspart (novoLOG) injection 0-15 Units  0-15 Units Subcutaneous Q4H Dahlia Byes, NP   2 Units at 11/01/23 2147   levothyroxine (SYNTHROID) tablet 75 mcg  75 mcg Oral Q0600 Janann Colonel, MD   75 mcg at 11/04/23 0532   LORazepam (ATIVAN) injection 0.5 mg  0.5 mg Intravenous Q4H PRN Delfino Lovett, MD   0.5 mg at 11/02/23 2351   morphine (PF) 2 MG/ML injection 0.5 mg  0.5 mg Intravenous Q4H PRN Delfino Lovett, MD   0.5 mg at 11/02/23 2352   multivitamin with minerals tablet 1 tablet  1 tablet Oral Daily Assaker, [place], MD   1 tablet at 11/02/23 1037   ondansetron (ZOFRAN) injection 4 mg  4 mg Intravenous Q6H PRN Dahlia Byes, NP   4 mg at 10/31/23 1814   polyethylene glycol (MIRALAX / GLYCOLAX) packet 17 g  17 g Oral Daily PRN Dahlia Byes, NP       polyvinyl alcohol (LIQUIFILM TEARS) 1.4 % ophthalmic solution 1 drop  1 drop Both Eyes QID PRN Delfino Lovett, MD       sertraline (ZOLOFT) tablet 100 mg  100 mg Oral Daily Assaker, [place], MD   100 mg at 11/02/23 1037   sodium chloride flush (NS) 0.9 % injection 10-40 mL  10-40 mL Intracatheter Q12H Dahlia Byes, NP   10 mL at 11/03/23 2103   sodium chloride flush (NS) 0.9 % injection 10-40 mL  10-40 mL Intracatheter PRN Dahlia Byes, NP       sodium chloride flush (NS) 0.9 % injection 3-10 mL  3-10 mL Intravenous Q12H Sherryll Burger, [place]ip[place], MD   5 mL at 11/03/23 2103   sodium chloride flush (NS) 0.9 % injection 3-10 mL   3-10 mL Intravenous PRN Delfino Lovett, MD         Discharge Medications: Please see discharge summary for a list of discharge medications.  Relevant Imaging Results:  Relevant Lab Results:   Additional Information SS#: [id number]  Margarito Liner, LCSW

## 2023-11-04 NOTE — Discharge Instructions (Signed)
 the [facility] offers a [employer] that anyone can call toll free at [phone]. The friendship line is available 24 hours a day  [facility] is a Program of All-inclusive Care for the Elderly (PACE). Their mission is to promote and sustain the independence of seniors wishing to remain in the community. They provide seniors with comprehensive long-term health, social, medical and dietary care. Their program is a safe alternative to nursing home care. [phone]  [facility] Physical Address [facility] [address] [place], [place] [address] Phone: [phone]. . Online zoom yoga class, connect with others without leaving your home [facility] offers [employer] dance cardio sessions for individuals via Zoom. This program provides: - Dance fitness activities Please contact program for more information. Servinganyone in need adults 18+ hiv/aids individuals families Call [phone]  Email [email or web address] to get more info  [employer] offers an online [employer] to individuals where they can receive help to focus on their best health. Whether you're a [employer] member or not, the neighborhood center offers a... Main Serviceshealth education  exercise & fitness  community support services  recreation  virtual support Other Servicessupport groups Servinganyone in need adults young adults teens seniors individuals families [email or web address] to get more info  Schedule on their website  The [facility] offers an array of activities for adults age 27 and over. This program provides:- Fitness and health programs- Tech classes- Activity books Main Serviceshealth education  community support services  exercise & fitness  recreation  more education Servingseniors  Call [phone]    For more resources go online to [email or web address] and type in you zipcode

## 2023-11-04 NOTE — TOC Transition Note (Signed)
Transition of Care [facility]) - Discharge Note   Patient Details  Name: Jeanne Tran MRN: [id number] Date of Birth: 1934/02/29  Transition of [facility]) CM/SW Contact:  Margarito Liner, LCSW Phone Number: 11/04/2023, 10:53 AM   Clinical Narrative: Patient has orders to discharge to [facility] today. RN will call report to [phone] (Room 606). EMS transport has been arranged and she is 4th on the list. No further concerns. CSW signing off.    Final next level of care: Skilled Nursing Facility (with hospice) Barriers to Discharge: Barriers Resolved   Patient Goals and CMS Choice            Discharge Placement   Existing PASRR number confirmed : 11/03/23          Patient chooses bed at: [facility] Patient to be transferred to facility by: EMS Name of family member notified: Lorrine Kin Patient and family notified of of transfer: 11/04/23  Discharge Plan and Services Additional resources added to the After Visit Summary for                                       Social Drivers of Health (SDOH) Interventions SDOH Screenings   Food Insecurity: No Food Insecurity (11/01/2023)  Housing: Low Risk  (11/01/2023)  Transportation Needs: No Transportation Needs (11/01/2023)  Utilities: Not At Risk (11/01/2023)  Alcohol Screen: Low Risk  (12/14/2021)  Depression (PHQ2-9): Medium Risk (08/13/2023)  Financial Resource Strain: Low Risk  (01/16/2022)  Physical Activity: Inactive (12/14/2021)  Social Connections: Socially Isolated (11/01/2023)  Stress: No Stress Concern Present (12/14/2021)  Tobacco Use: Medium Risk (10/28/2023)     Readmission Risk Interventions    05/01/2023   11:11 AM  Readmission Risk Prevention Plan  Transportation Screening Complete  PCP or Specialist Appt within 5-7 Days Complete  Home Care Screening Complete  Medication Review (RN CM) Complete

## 2023-11-04 NOTE — Discharge Summary (Addendum)
Physician Discharge Summary   Patient: Jeanne Tran MRN: [id number] DOB: 08/21/1934  Admit date:     10/28/2023  Discharge date: 11/04/23  Discharge Physician: Delfino Lovett   PCP: Madelin Headings, MD   Recommendations at discharge:    Hospice at the facility  Discharge Diagnoses: Principal Problem:   Septic shock [facility]) Active Problems:   Hospice care   C. difficile colitis  Hospital Course: Assessment and Plan:  89 year old with a PMH significant for HLD, GERD, CKD stage 3, Hypothyroidism, Pulmonary nodule, RLS, Breast cancer, Hip fracture 04/2023 that presented to the ER 10/28/2023 with AMS.  She was admitted to ICU for septic shock   2/11: Patient admitted with septic shock, likely due to UTI. Levophed/vasopressin. Bicarbonate drip/bolus's. Antibiotics, cultures, stress dose steroids.  02/12: C.diff +. Started on Flagyl and PO vanco. Not a candidate for Surgery.  02/13: Hemodynamically improving. Mental status improved.  2/14: Transfer out of ICU 2/15: TRH picked up.  Kidney function continues to get worse 2/16: Hospice screening 2/17: back to facility with Hospice   # Shock likely distributive/septic due to UTI in the setting of (Resolved) -urine culture growing Proteus Mirabilis.  Plan for comfort care #C.diff colitis (Fulminant C.diff) not a candidate for surgical intervention.  Diarrhea improving # Lactic acidosis with downtrending lactate currently at 4.0 --> 3.4. secondary to the above.  Will order lactic acid trends # AKI with CKD stage IIIb creatinine 2.2 mg/dL from a baseline of 1.5 mg/dL likely ATN in the setting of sepsis.Worsening currently at 5.71 mg/dl.  Not a acute indication for dialysis yet as patient is making urine and not quite uremic.  She is likely not a good candidate for dialysis either based on my discussion with nephrology.  Family chose comfort care/hospice # Hepatitis with elevated AST at 1400 and elevated ALT at 900 likely in the setting of shock  liver downtrending.  Monitor # COVID-19 positive on 10/28/2023  # Hypothyroidism: Continue levothyroxine # Goals of care: Family shared that patient has had significant clinical and functional decline last 6 months.  She is not ambulating much at [facility] since her hip fracture.  Family chose comfort care/hospice at [facility].  Overall poor prognosis          Consultants: Nephro, Palliative care Disposition: Hospice care Diet recommendation:  Carb modified diet DISCHARGE MEDICATION: Allergies as of 11/04/2023       Reactions   Codeine Other (See Comments)   REACTION: Intolerance w/ pain meds not cough syrup. Codeine makes her bounce off the walls. falling REACTION: Intolerance w/ pain meds not cough syrup. Codeine makes her bounce off the walls.   Aspirin Other (See Comments)   GI issues GI issues        Medication List     STOP taking these medications    melatonin 3 MG Tabs tablet   mirtazapine 7.5 MG tablet Commonly known as: REMERON   Omega 3 1000 MG Caps   polyethylene glycol 17 g packet Commonly known as: MIRALAX / GLYCOLAX   vitamin B-12 100 MCG tablet Commonly known as: CYANOCOBALAMIN   zolpidem 5 MG tablet Commonly known as: AMBIEN       TAKE these medications    acetaminophen 325 MG tablet Commonly known as: TYLENOL Take 975 mg by mouth in the morning, at noon, and at bedtime.   calcium carbonate 1250 (500 Ca) MG tablet Commonly known as: OS-CAL - dosed in mg of elemental calcium Take 1  tablet by mouth daily with breakfast.   levothyroxine 75 MCG tablet Commonly known as: SYNTHROID Take 75 mcg by mouth daily.   multivitamin with minerals tablet Take 1 tablet by mouth daily.   senna-docusate 8.6-50 MG tablet Commonly known as: Senokot-S Take 1 tablet by mouth 2 (two) times daily.   sertraline 100 MG tablet Commonly known as: ZOLOFT TAKE 1 TABLET(100 MG) BY MOUTH DAILY What changed: See the new instructions.         Follow-up Information     Panosh, Neta Mends, MD. Schedule an appointment as soon as possible for a visit in 1 week(s).   Specialties: Internal Medicine, Pediatrics Why: [facility] Discharge F/UP Contact information: [address] [place] [address] [phone]                Discharge Exam: Filed Weights   10/31/23 0454 11/01/23 0735 11/04/23 0500  Weight: 66.3 kg 74.3 kg 73.1 kg   General exam: Cachectic looking, chronically ill and frail appearing Respiratory system: Clear to auscultation. Respiratory effort normal. Cardiovascular system: S1 & S2 heard, RRR.  Gastrointestinal system: Abdomen is soft, benign Central nervous system: nonfocal, alert and wake   Condition at discharge: poor  The results of significant diagnostics from this hospitalization (including imaging, microbiology, ancillary and laboratory) are listed below for reference.   Imaging Studies: US RENAL Result Date: 10/31/2023 CLINICAL DATA:  [id number] AKI (acute kidney injury) (HCC) [phone], inpatient EXAM: RENAL / URINARY TRACT ULTRASOUND COMPLETE COMPARISON:  10/28/2023 CT chest, abdomen and pelvis FINDINGS: Right Kidney: Renal measurements: 10.0 x 3.6 x 3.6 cm = volume: 69 mL. Mildly echogenic right renal parenchyma, normal thickness. No hydronephrosis. No renal masses. Left Kidney: Renal measurements: 9.8 x 4.7 x 4.2 cm = volume: 102 mL. Mildly echogenic left renal parenchyma, normal thickness. No hydronephrosis. No renal masses. Bladder: Bladder relatively collapsed by indwelling Foley catheter. Other: None. IMPRESSION: 1. No hydronephrosis. 2. Mildly echogenic renal parenchyma bilaterally, compatible with nonspecific acute renal parenchymal disease. Electronically Signed   By: Delbert Phenix M.D.   On: 10/31/2023 14:42   DG Chest Port 1 View Result Date: 10/29/2023 CLINICAL DATA:  Central line placement EXAM: PORTABLE CHEST 1 VIEW COMPARISON:  10/28/2023 FINDINGS: Right central line tip at the  cavoatrial junction. No pneumothorax. Heart and mediastinal contours within normal limits. Emphysema. Left lower lobe opacity could reflect atelectasis or infiltrate. No effusions. No acute bony abnormality. IMPRESSION: Right central line tip at the cavoatrial junction.  No pneumothorax. Left lower lobe atelectasis or infiltrate. Electronically Signed   By: Charlett Nose M.D.   On: 10/29/2023 01:00   CT CHEST ABDOMEN PELVIS WO CONTRAST Result Date: 10/28/2023 CLINICAL DATA:  Sepsis. Syncopal episode tonight when on the toilet. Altered mental status. Low blood pressures. Had COVID-19 in late January. EXAM: CT CHEST, ABDOMEN AND PELVIS WITHOUT CONTRAST TECHNIQUE: Multidetector CT imaging of the chest, abdomen and pelvis was performed following the standard protocol without IV contrast. RADIATION DOSE REDUCTION: This exam was performed according to the departmental dose-optimization program which includes automated exposure control, adjustment of the mA and/or kV according to patient size and/or use of iterative reconstruction technique. COMPARISON:  Same day chest radiograph; CT 07/20/2007 FINDINGS: CT CHEST FINDINGS Cardiovascular: No pericardial effusion. Advanced coronary artery and aortic atherosclerotic calcification. Mediastinum/Nodes: Trachea is unremarkable. Moderate to large hiatal hernia. No thoracic adenopathy. Lungs/Pleura: Advanced emphysema. Bronchial wall thickening. Scarring/atelectasis in the lung bases. Multiple right middle lobe pulmonary nodules measuring up to 5 mm (  series 4/image 84 and 4/94). These are stable since 2008 compatible with benign etiology. No follow-up recommended. No pleural effusion or pneumothorax. Musculoskeletal: No acute fracture. CT ABDOMEN PELVIS FINDINGS Hepatobiliary: Cholecystectomy. Nonspecific periportal edema. No definite biliary dilation. Pancreas: No acute abnormality. Spleen: Unremarkable. Adrenals/Urinary Tract: Unremarkable adrenal glands. No urinary calculi or  hydronephrosis. Diffuse bladder wall thickening of the nondistended bladder. Stomach/Bowel: No evidence of bowel obstruction. Colonic diverticulosis without diverticulitis. Herniation of a nonobstructed loop of small bowel into the right inguinal hernia. Vascular/Lymphatic: Aortic atherosclerosis. No enlarged abdominal or pelvic lymph nodes. Reproductive: Hysterectomy. Other: Small volume free fluid in the pelvis. No free intraperitoneal air. Musculoskeletal: No acute fracture. Chronic compression fracture of L1. ORIF left femur. No acute fracture. IMPRESSION: 1. Nonspecific periportal edema. Differential considerations include but are not limited to volume overload or hepatitis. Correlate with LFTs 2. Diffuse bladder wall thickening of the nondistended bladder. Correlate with urinalysis to exclude cystitis. 3. Herniation of a nonobstructed loop of small bowel into the right inguinal hernia. 4. Moderate to large hiatal hernia. 5. Aortic Atherosclerosis (ICD10-I70.0) and Emphysema (ICD10-J43.9). Electronically Signed   By: Minerva Fester M.D.   On: 10/28/2023 21:48   DG Chest Port 1 View Result Date: 10/28/2023 CLINICAL DATA:  Possible sepsis EXAM: PORTABLE CHEST 1 VIEW COMPARISON:  04/25/2023 FINDINGS: Low lung volumes. Surgical clips in the left axilla. Patchy airspace disease at left base. Stable cardiomediastinal silhouette with aortic atherosclerosis. No pneumothorax IMPRESSION: Low lung volumes with patchy airspace disease at left base, atelectasis versus pneumonia. Electronically Signed   By: Jasmine Pang M.D.   On: 10/28/2023 20:27    Microbiology: Results for orders placed or performed during the hospital encounter of 10/28/23  Blood Culture (routine x 2)     Status: None   Collection Time: 10/28/23  7:38 PM   Specimen: BLOOD RIGHT WRIST  Result Value Ref Range Status   Specimen Description BLOOD RIGHT WRIST  Final   Special Requests   Final    BOTTLES DRAWN AEROBIC AND ANAEROBIC Blood  Culture results may not be optimal due to an inadequate volume of blood received in culture bottles   Culture   Final    NO GROWTH 5 DAYS Performed at [facility], [address] [place], [place] [address]    Report Status 11/02/2023 FINAL  Final  Blood Culture (routine x 2)     Status: None   Collection Time: 10/28/23  7:45 PM   Specimen: Right Antecubital; Blood  Result Value Ref Range Status   Specimen Description RIGHT ANTECUBITAL  Final   Special Requests   Final    BOTTLES DRAWN AEROBIC AND ANAEROBIC Blood Culture results may not be optimal due to an inadequate volume of blood received in culture bottles   Culture   Final    NO GROWTH 5 DAYS Performed at [facility], [address], [place], [place] [address]    Report Status 11/02/2023 FINAL  Final  Urine Culture     Status: Abnormal   Collection Time: 10/28/23  8:20 PM   Specimen: Urine, Random  Result Value Ref Range Status   Specimen Description   Final    URINE, RANDOM Performed at [facility], [address] [place], [place] [address]    Special Requests   Final    NONE Reflexed from [phone] Performed at [facility], [address] [place], [place] [address]    Culture 20,000 COLONIES/mL PROTEUS MIRABILIS (A)  Final  Report Status 10/30/2023 FINAL  Final   Organism ID, Bacteria PROTEUS MIRABILIS (A)  Final      Susceptibility   Proteus mirabilis - MIC*    AMPICILLIN <=2 SENSITIVE Sensitive     CEFAZOLIN <=4 SENSITIVE Sensitive     CEFEPIME <=0.12 SENSITIVE Sensitive     CEFTRIAXONE <=0.25 SENSITIVE Sensitive     CIPROFLOXACIN <=0.25 SENSITIVE Sensitive     GENTAMICIN <=1 SENSITIVE Sensitive     IMIPENEM 1 SENSITIVE Sensitive     NITROFURANTOIN >=512 RESISTANT Resistant     TRIMETH/SULFA <=20 SENSITIVE Sensitive     AMPICILLIN/SULBACTAM <=2 SENSITIVE Sensitive     PIP/TAZO <=4 SENSITIVE Sensitive ug/mL    * 20,000 COLONIES/mL PROTEUS MIRABILIS  Resp panel by  RT-PCR (RSV, Flu A&B, Covid) Anterior Nasal Swab     Status: Abnormal   Collection Time: 10/28/23  9:58 PM   Specimen: Anterior Nasal Swab  Result Value Ref Range Status   SARS Coronavirus 2 by RT PCR POSITIVE (A) NEGATIVE Final    Comment: (NOTE) SARS-CoV-2 target nucleic acids are DETECTED.  The SARS-CoV-2 RNA is generally detectable in upper respiratory specimens during the acute phase of infection. Positive results are indicative of the presence of the identified virus, but do not rule out bacterial infection or co-infection with other pathogens not detected by the test. Clinical correlation with patient history and other diagnostic information is necessary to determine patient infection status. The expected result is Negative.  Fact Sheet for Patients: BloggerCourse.com  Fact Sheet for Healthcare Providers: SeriousBroker.it  This test is not yet approved or cleared by the [place] FDA and  has been authorized for detection and/or diagnosis of SARS-CoV-2 by FDA under an Emergency Use Authorization (EUA).  This EUA will remain in effect (meaning this test can be used) for the duration of  the COVID-19 declaration under Section 564(b)(1) of the A ct, 21 U.S.C. section 360bbb-3(b)(1), unless the authorization is terminated or revoked sooner.     Influenza A by PCR NEGATIVE NEGATIVE Final   Influenza B by PCR NEGATIVE NEGATIVE Final    Comment: (NOTE) The Xpert Xpress SARS-CoV-2/FLU/RSV plus assay is intended as an aid in the diagnosis of influenza from Nasopharyngeal swab specimens and should not be used as a sole basis for treatment. Nasal washings and aspirates are unacceptable for Xpert Xpress SARS-CoV-2/FLU/RSV testing.  Fact Sheet for Patients: BloggerCourse.com  Fact Sheet for Healthcare Providers: SeriousBroker.it  This test is not yet approved or cleared by the  [place] FDA and has been authorized for detection and/or diagnosis of SARS-CoV-2 by FDA under an Emergency Use Authorization (EUA). This EUA will remain in effect (meaning this test can be used) for the duration of the COVID-19 declaration under Section 564(b)(1) of the Act, 21 U.S.C. section 360bbb-3(b)(1), unless the authorization is terminated or revoked.     Resp Syncytial Virus by PCR NEGATIVE NEGATIVE Final    Comment: (NOTE) Fact Sheet for Patients: BloggerCourse.com  Fact Sheet for Healthcare Providers: SeriousBroker.it  This test is not yet approved or cleared by the [place] FDA and has been authorized for detection and/or diagnosis of SARS-CoV-2 by FDA under an Emergency Use Authorization (EUA). This EUA will remain in effect (meaning this test can be used) for the duration of the COVID-19 declaration under Section 564(b)(1) of the Act, 21 U.S.C. section 360bbb-3(b)(1), unless the authorization is terminated or revoked.  Performed at [facility], [address] [place], [place] [address]   MRSA  Next Gen by PCR, Nasal     Status: None   Collection Time: 10/28/23 11:16 PM   Specimen: Nasal Mucosa; Nasal Swab  Result Value Ref Range Status   MRSA by PCR Next Gen NOT DETECTED NOT DETECTED Final    Comment: (NOTE) The GeneXpert MRSA Assay (FDA approved for NASAL specimens only), is one component of a comprehensive MRSA colonization surveillance program. It is not intended to diagnose MRSA infection nor to guide or monitor treatment for MRSA infections. Test performance is not FDA approved in patients less than 67 years old. Performed at [facility], [address] [place], [place] [address]   Stool culture     Status: None   Collection Time: 10/28/23 11:51 PM   Specimen: Stool  Result Value Ref Range Status   Salmonella/Shigella Screen Final report  Final   Campylobacter Culture  Final report  Final   E coli, Shiga toxin Assay Negative Negative Final    Comment: (NOTE) Performed At: [facility] [address] [place], [place] [id number] Jolene Schimke MD [id number]   STOOL CULTURE REFLEX - RSASHR     Status: None   Collection Time: 10/28/23 11:51 PM  Result Value Ref Range Status   Stool Culture result 1 (RSASHR) Comment  Final    Comment: (NOTE) No Salmonella or Shigella recovered. Performed At: [facility] [address] [place], [place] [id number] Jolene Schimke MD WN:[id number]   STOOL CULTURE Reflex - CMPCXR     Status: None   Collection Time: 10/28/23 11:51 PM  Result Value Ref Range Status   Stool Culture result 1 (CMPCXR) Comment  Final    Comment: (NOTE) No Campylobacter species isolated. Performed At: [facility] [address] [place], [place] [id number] Jolene Schimke MD [id number]   C Difficile Quick Screen w PCR reflex     Status: Abnormal   Collection Time: 10/29/23 10:47 AM   Specimen: Stool  Result Value Ref Range Status   C Diff antigen POSITIVE (A) NEGATIVE Final    Comment: CRITICAL RESULT CALLED TO, READ BACK BY AND VERIFIED WITH: CORN, C. 1401 10/29/2023 LRL    C Diff toxin POSITIVE (A) NEGATIVE Final    Comment: CRITICAL RESULT CALLED TO, READ BACK BY AND VERIFIED WITH: CORN, C. 1401 10/29/2023 LRL    C Diff interpretation Toxin producing C. difficile detected.  Final    Comment: CRITICAL RESULT CALLED TO, READ BACK BY AND VERIFIED WITH: CORN, C. 1401 10/29/2023 LRL Performed at [facility], [address] [place], [place] [address]   Gastrointestinal Panel by PCR , Stool     Status: None   Collection Time: 10/29/23 12:27 PM   Specimen: Stool  Result Value Ref Range Status   Campylobacter species NOT DETECTED NOT DETECTED Final   Plesimonas shigelloides NOT DETECTED NOT DETECTED Final   Salmonella species NOT DETECTED NOT DETECTED Final   Yersinia enterocolitica NOT DETECTED  NOT DETECTED Final   Vibrio species NOT DETECTED NOT DETECTED Final   Vibrio cholerae NOT DETECTED NOT DETECTED Final   Enteroaggregative E coli (EAEC) NOT DETECTED NOT DETECTED Final   Enteropathogenic E coli (EPEC) NOT DETECTED NOT DETECTED Final   Enterotoxigenic E coli (ETEC) NOT DETECTED NOT DETECTED Final   Shiga like toxin producing E coli (STEC) NOT DETECTED NOT DETECTED Final   Shigella/Enteroinvasive E coli (EIEC) NOT DETECTED NOT DETECTED Final   Cryptosporidium NOT DETECTED NOT DETECTED Final   Cyclospora cayetanensis NOT DETECTED NOT DETECTED Final  Entamoeba histolytica NOT DETECTED NOT DETECTED Final   Giardia lamblia NOT DETECTED NOT DETECTED Final   Adenovirus F40/41 NOT DETECTED NOT DETECTED Final   Astrovirus NOT DETECTED NOT DETECTED Final   Norovirus GI/GII NOT DETECTED NOT DETECTED Final   Rotavirus A NOT DETECTED NOT DETECTED Final   Sapovirus (I, II, IV, and V) NOT DETECTED NOT DETECTED Final    Comment: Performed at [facility], [address] [place], [place] [address]    Labs: CBC: Recent Labs  Lab 10/28/23 1938 10/29/23 0133 10/29/23 1406 10/30/23 0456 10/31/23 0353 11/01/23 0638 11/02/23 0500  WBC 15.2*   < > 36.5* 31.2* 25.3* 25.3* 27.1*  NEUTROABS 6.3  --   --   --   --   --   --   HGB 10.7*   < > 11.3* 9.9* 8.9* 9.0* 9.2*  HCT 35.4*   < > 33.5* 29.3* 25.1* 26.0* 25.9*  MCV 109.6*   < > 98.2 96.4 93.0 94.5 93.5  PLT 190   < > 143* 147* 109* 122* 120*   < > = values in this interval not displayed.   Basic Metabolic Panel: Recent Labs  Lab 10/29/23 0426 10/29/23 1227 10/30/23 0456 10/31/23 0353 11/01/23 0638 11/02/23 0500  NA 136 135 132* 131* 134* 136  K 4.3 3.8 4.3 4.5 3.7 3.5  CL 105 101 99 97* 99 95*  CO2 15* 17* 19* 19* 22 24  GLUCOSE 163* 86 180* 118* 131* 136*  BUN 47* 53* 61* 73* 83* 86*  CREATININE 1.99* 2.28* 3.09* 4.20* 5.34* 5.71*  CALCIUM 8.0* 8.2* 8.0* 7.9* 8.2* 8.1*  MG 1.9  --  1.5* 2.3 2.2 2.3  PHOS  6.3*  --  4.9* 5.1* 5.8* 5.0*   Liver Function Tests: Recent Labs  Lab 10/28/23 1938 10/29/23 1227 10/30/23 0456 11/02/23 0500  AST 110* 1,486* 642* 56*  ALT 57* 908* 599* 178*  ALKPHOS 100 190* 139* 107  BILITOT 0.6 0.8 0.9 0.7  PROT 5.0* 4.6* 5.0* 4.8*  ALBUMIN 2.5* 2.4* 2.5* 2.6*   CBG: Recent Labs  Lab 11/01/23 2353 11/02/23 0453 11/02/23 0822 11/02/23 1233 11/02/23 1627  GLUCAP 108* 115* 116* 131* 145*    Discharge time spent: greater than 30 minutes.  Signed: Delfino Lovett, MD [facility] 11/04/2023

## 2023-11-04 NOTE — Plan of Care (Signed)
  Problem: Education: Goal: Knowledge of General Education information will improve Description: Including pain rating scale, medication(s)/side effects and non-pharmacologic comfort measures Outcome: Progressing   Problem: Health Behavior/Discharge Planning: Goal: Ability to manage health-related needs will improve Outcome: Progressing   Problem: Clinical Measurements: Goal: Ability to maintain clinical measurements within normal limits will improve Outcome: Progressing Goal: Will remain free from infection Outcome: Progressing Goal: Diagnostic test results will improve Outcome: Progressing Goal: Respiratory complications will improve Outcome: Progressing Goal: Cardiovascular complication will be avoided Outcome: Progressing   Problem: Activity: Goal: Risk for activity intolerance will decrease Outcome: Progressing   Problem: Nutrition: Goal: Adequate nutrition will be maintained Outcome: Progressing   Problem: Coping: Goal: Level of anxiety will decrease Outcome: Progressing   Problem: Elimination: Goal: Will not experience complications related to bowel motility Outcome: Progressing Goal: Will not experience complications related to urinary retention Outcome: Progressing   Problem: Pain Managment: Goal: General experience of comfort will improve and/or be controlled Outcome: Progressing   Problem: Safety: Goal: Ability to remain free from injury will improve Outcome: Progressing   Problem: Skin Integrity: Goal: Risk for impaired skin integrity will decrease Outcome: Progressing   Problem: Education: Goal: Ability to describe self-care measures that may prevent or decrease complications (Diabetes Survival Skills Education) will improve Outcome: Progressing Goal: Individualized Educational Video(s) Outcome: Progressing   Problem: Coping: Goal: Ability to adjust to condition or change in health will improve Outcome: Progressing   Problem: Fluid  Volume: Goal: Ability to maintain a balanced intake and output will improve Outcome: Progressing   Problem: Health Behavior/Discharge Planning: Goal: Ability to identify and utilize available resources and services will improve Outcome: Progressing Goal: Ability to manage health-related needs will improve Outcome: Progressing   Problem: Metabolic: Goal: Ability to maintain appropriate glucose levels will improve Outcome: Progressing   Problem: Nutritional: Goal: Maintenance of adequate nutrition will improve Outcome: Progressing Goal: Progress toward achieving an optimal weight will improve Outcome: Progressing   Problem: Skin Integrity: Goal: Risk for impaired skin integrity will decrease Outcome: Progressing   Problem: Tissue Perfusion: Goal: Adequacy of tissue perfusion will improve Outcome: Progressing   Problem: Education: Goal: Knowledge of risk factors and measures for prevention of condition will improve Outcome: Progressing   Problem: Coping: Goal: Psychosocial and spiritual needs will be supported Outcome: Progressing   Problem: Respiratory: Goal: Will maintain a patent airway Outcome: Progressing Goal: Complications related to the disease process, condition or treatment will be avoided or minimized Outcome: Progressing

## 2023-11-05 ENCOUNTER — Ambulatory Visit: Payer: Medicare Other | Admitting: Internal Medicine

## 2023-11-05 DIAGNOSIS — A419 Sepsis, unspecified organism: Secondary | ICD-10-CM | POA: Diagnosis not present

## 2023-11-05 DIAGNOSIS — A0472 Enterocolitis due to Clostridium difficile, not specified as recurrent: Secondary | ICD-10-CM | POA: Diagnosis not present

## 2023-11-05 DIAGNOSIS — N390 Urinary tract infection, site not specified: Secondary | ICD-10-CM | POA: Diagnosis not present

## 2023-11-05 DIAGNOSIS — N179 Acute kidney failure, unspecified: Secondary | ICD-10-CM | POA: Diagnosis not present

## 2023-11-05 DIAGNOSIS — Z515 Encounter for palliative care: Secondary | ICD-10-CM | POA: Diagnosis not present

## 2023-11-05 DIAGNOSIS — Z9981 Dependence on supplemental oxygen: Secondary | ICD-10-CM | POA: Diagnosis not present

## 2023-11-06 DIAGNOSIS — F32A Depression, unspecified: Secondary | ICD-10-CM | POA: Diagnosis not present

## 2023-11-06 DIAGNOSIS — N186 End stage renal disease: Secondary | ICD-10-CM | POA: Diagnosis not present

## 2023-11-06 DIAGNOSIS — R1312 Dysphagia, oropharyngeal phase: Secondary | ICD-10-CM | POA: Diagnosis not present

## 2023-11-06 DIAGNOSIS — E785 Hyperlipidemia, unspecified: Secondary | ICD-10-CM | POA: Diagnosis not present

## 2023-11-06 DIAGNOSIS — G2581 Restless legs syndrome: Secondary | ICD-10-CM | POA: Diagnosis not present

## 2023-11-06 DIAGNOSIS — K219 Gastro-esophageal reflux disease without esophagitis: Secondary | ICD-10-CM | POA: Diagnosis not present

## 2023-11-06 DIAGNOSIS — E039 Hypothyroidism, unspecified: Secondary | ICD-10-CM | POA: Diagnosis not present

## 2023-11-07 DIAGNOSIS — E039 Hypothyroidism, unspecified: Secondary | ICD-10-CM | POA: Diagnosis not present

## 2023-11-07 DIAGNOSIS — N179 Acute kidney failure, unspecified: Secondary | ICD-10-CM | POA: Diagnosis not present

## 2023-11-07 DIAGNOSIS — G2581 Restless legs syndrome: Secondary | ICD-10-CM | POA: Diagnosis not present

## 2023-11-07 DIAGNOSIS — A419 Sepsis, unspecified organism: Secondary | ICD-10-CM | POA: Diagnosis not present

## 2023-11-07 DIAGNOSIS — A0472 Enterocolitis due to Clostridium difficile, not specified as recurrent: Secondary | ICD-10-CM | POA: Diagnosis not present

## 2023-11-07 DIAGNOSIS — N390 Urinary tract infection, site not specified: Secondary | ICD-10-CM | POA: Diagnosis not present

## 2023-11-07 DIAGNOSIS — F32A Depression, unspecified: Secondary | ICD-10-CM | POA: Diagnosis not present

## 2023-11-07 DIAGNOSIS — Z9981 Dependence on supplemental oxygen: Secondary | ICD-10-CM | POA: Diagnosis not present

## 2023-11-07 DIAGNOSIS — E785 Hyperlipidemia, unspecified: Secondary | ICD-10-CM | POA: Diagnosis not present

## 2023-11-07 DIAGNOSIS — N186 End stage renal disease: Secondary | ICD-10-CM | POA: Diagnosis not present

## 2023-11-07 DIAGNOSIS — K219 Gastro-esophageal reflux disease without esophagitis: Secondary | ICD-10-CM | POA: Diagnosis not present

## 2023-11-07 DIAGNOSIS — Z515 Encounter for palliative care: Secondary | ICD-10-CM | POA: Diagnosis not present

## 2023-11-10 DIAGNOSIS — Z515 Encounter for palliative care: Secondary | ICD-10-CM | POA: Diagnosis not present

## 2023-11-10 DIAGNOSIS — E785 Hyperlipidemia, unspecified: Secondary | ICD-10-CM | POA: Diagnosis not present

## 2023-11-10 DIAGNOSIS — N179 Acute kidney failure, unspecified: Secondary | ICD-10-CM | POA: Diagnosis not present

## 2023-11-10 DIAGNOSIS — Z9981 Dependence on supplemental oxygen: Secondary | ICD-10-CM | POA: Diagnosis not present

## 2023-11-10 DIAGNOSIS — F32A Depression, unspecified: Secondary | ICD-10-CM | POA: Diagnosis not present

## 2023-11-10 DIAGNOSIS — A0472 Enterocolitis due to Clostridium difficile, not specified as recurrent: Secondary | ICD-10-CM | POA: Diagnosis not present

## 2023-11-10 DIAGNOSIS — N390 Urinary tract infection, site not specified: Secondary | ICD-10-CM | POA: Diagnosis not present

## 2023-11-10 DIAGNOSIS — A419 Sepsis, unspecified organism: Secondary | ICD-10-CM | POA: Diagnosis not present

## 2023-11-10 DIAGNOSIS — E039 Hypothyroidism, unspecified: Secondary | ICD-10-CM | POA: Diagnosis not present

## 2023-11-10 DIAGNOSIS — K219 Gastro-esophageal reflux disease without esophagitis: Secondary | ICD-10-CM | POA: Diagnosis not present

## 2023-11-10 DIAGNOSIS — N186 End stage renal disease: Secondary | ICD-10-CM | POA: Diagnosis not present

## 2023-11-10 DIAGNOSIS — R1312 Dysphagia, oropharyngeal phase: Secondary | ICD-10-CM | POA: Diagnosis not present

## 2023-11-10 DIAGNOSIS — G2581 Restless legs syndrome: Secondary | ICD-10-CM | POA: Diagnosis not present

## 2023-11-11 DIAGNOSIS — R1312 Dysphagia, oropharyngeal phase: Secondary | ICD-10-CM | POA: Diagnosis not present

## 2023-11-12 DIAGNOSIS — E039 Hypothyroidism, unspecified: Secondary | ICD-10-CM | POA: Diagnosis not present

## 2023-11-12 DIAGNOSIS — N179 Acute kidney failure, unspecified: Secondary | ICD-10-CM | POA: Diagnosis not present

## 2023-11-12 DIAGNOSIS — N186 End stage renal disease: Secondary | ICD-10-CM | POA: Diagnosis not present

## 2023-11-12 DIAGNOSIS — A0472 Enterocolitis due to Clostridium difficile, not specified as recurrent: Secondary | ICD-10-CM | POA: Diagnosis not present

## 2023-11-12 DIAGNOSIS — E785 Hyperlipidemia, unspecified: Secondary | ICD-10-CM | POA: Diagnosis not present

## 2023-11-12 DIAGNOSIS — G2581 Restless legs syndrome: Secondary | ICD-10-CM | POA: Diagnosis not present

## 2023-11-12 DIAGNOSIS — K219 Gastro-esophageal reflux disease without esophagitis: Secondary | ICD-10-CM | POA: Diagnosis not present

## 2023-11-12 DIAGNOSIS — A419 Sepsis, unspecified organism: Secondary | ICD-10-CM | POA: Diagnosis not present

## 2023-11-12 DIAGNOSIS — F32A Depression, unspecified: Secondary | ICD-10-CM | POA: Diagnosis not present

## 2023-11-12 DIAGNOSIS — Z515 Encounter for palliative care: Secondary | ICD-10-CM | POA: Diagnosis not present

## 2023-11-12 DIAGNOSIS — N390 Urinary tract infection, site not specified: Secondary | ICD-10-CM | POA: Diagnosis not present

## 2023-11-12 DIAGNOSIS — Z9981 Dependence on supplemental oxygen: Secondary | ICD-10-CM | POA: Diagnosis not present

## 2023-11-13 DIAGNOSIS — A0472 Enterocolitis due to Clostridium difficile, not specified as recurrent: Secondary | ICD-10-CM | POA: Diagnosis not present

## 2023-11-13 DIAGNOSIS — R6521 Severe sepsis with septic shock: Secondary | ICD-10-CM | POA: Diagnosis not present

## 2023-11-13 DIAGNOSIS — R1312 Dysphagia, oropharyngeal phase: Secondary | ICD-10-CM | POA: Diagnosis not present

## 2023-11-13 DIAGNOSIS — N390 Urinary tract infection, site not specified: Secondary | ICD-10-CM | POA: Diagnosis not present

## 2023-11-13 DIAGNOSIS — Z9981 Dependence on supplemental oxygen: Secondary | ICD-10-CM | POA: Diagnosis not present

## 2023-11-13 DIAGNOSIS — N179 Acute kidney failure, unspecified: Secondary | ICD-10-CM | POA: Diagnosis not present

## 2023-11-13 DIAGNOSIS — Z515 Encounter for palliative care: Secondary | ICD-10-CM | POA: Diagnosis not present

## 2023-11-14 DIAGNOSIS — N390 Urinary tract infection, site not specified: Secondary | ICD-10-CM | POA: Diagnosis not present

## 2023-11-14 DIAGNOSIS — N179 Acute kidney failure, unspecified: Secondary | ICD-10-CM | POA: Diagnosis not present

## 2023-11-14 DIAGNOSIS — Z515 Encounter for palliative care: Secondary | ICD-10-CM | POA: Diagnosis not present

## 2023-11-14 DIAGNOSIS — Z9981 Dependence on supplemental oxygen: Secondary | ICD-10-CM | POA: Diagnosis not present

## 2023-11-14 DIAGNOSIS — R6521 Severe sepsis with septic shock: Secondary | ICD-10-CM | POA: Diagnosis not present

## 2023-11-14 DIAGNOSIS — A0472 Enterocolitis due to Clostridium difficile, not specified as recurrent: Secondary | ICD-10-CM | POA: Diagnosis not present

## 2023-11-15 DIAGNOSIS — K219 Gastro-esophageal reflux disease without esophagitis: Secondary | ICD-10-CM | POA: Diagnosis not present

## 2023-11-15 DIAGNOSIS — E039 Hypothyroidism, unspecified: Secondary | ICD-10-CM | POA: Diagnosis not present

## 2023-11-15 DIAGNOSIS — F32A Depression, unspecified: Secondary | ICD-10-CM | POA: Diagnosis not present

## 2023-11-15 DIAGNOSIS — N186 End stage renal disease: Secondary | ICD-10-CM | POA: Diagnosis not present

## 2023-11-15 DIAGNOSIS — G2581 Restless legs syndrome: Secondary | ICD-10-CM | POA: Diagnosis not present

## 2023-11-15 DIAGNOSIS — E785 Hyperlipidemia, unspecified: Secondary | ICD-10-CM | POA: Diagnosis not present

## 2023-11-17 DIAGNOSIS — K219 Gastro-esophageal reflux disease without esophagitis: Secondary | ICD-10-CM | POA: Diagnosis not present

## 2023-11-17 DIAGNOSIS — N186 End stage renal disease: Secondary | ICD-10-CM | POA: Diagnosis not present

## 2023-11-17 DIAGNOSIS — E039 Hypothyroidism, unspecified: Secondary | ICD-10-CM | POA: Diagnosis not present

## 2023-11-17 DIAGNOSIS — F32A Depression, unspecified: Secondary | ICD-10-CM | POA: Diagnosis not present

## 2023-11-17 DIAGNOSIS — E785 Hyperlipidemia, unspecified: Secondary | ICD-10-CM | POA: Diagnosis not present

## 2023-11-17 DIAGNOSIS — G2581 Restless legs syndrome: Secondary | ICD-10-CM | POA: Diagnosis not present

## 2023-11-18 DIAGNOSIS — Z9981 Dependence on supplemental oxygen: Secondary | ICD-10-CM | POA: Diagnosis not present

## 2023-11-18 DIAGNOSIS — N390 Urinary tract infection, site not specified: Secondary | ICD-10-CM | POA: Diagnosis not present

## 2023-11-18 DIAGNOSIS — A0472 Enterocolitis due to Clostridium difficile, not specified as recurrent: Secondary | ICD-10-CM | POA: Diagnosis not present

## 2023-11-18 DIAGNOSIS — Z515 Encounter for palliative care: Secondary | ICD-10-CM | POA: Diagnosis not present

## 2023-11-18 DIAGNOSIS — R1312 Dysphagia, oropharyngeal phase: Secondary | ICD-10-CM | POA: Diagnosis not present

## 2023-11-18 DIAGNOSIS — N179 Acute kidney failure, unspecified: Secondary | ICD-10-CM | POA: Diagnosis not present

## 2023-11-18 DIAGNOSIS — R6521 Severe sepsis with septic shock: Secondary | ICD-10-CM | POA: Diagnosis not present

## 2023-11-19 DIAGNOSIS — N186 End stage renal disease: Secondary | ICD-10-CM | POA: Diagnosis not present

## 2023-11-19 DIAGNOSIS — R1312 Dysphagia, oropharyngeal phase: Secondary | ICD-10-CM | POA: Diagnosis not present

## 2023-11-19 DIAGNOSIS — F32A Depression, unspecified: Secondary | ICD-10-CM | POA: Diagnosis not present

## 2023-11-19 DIAGNOSIS — K219 Gastro-esophageal reflux disease without esophagitis: Secondary | ICD-10-CM | POA: Diagnosis not present

## 2023-11-19 DIAGNOSIS — E039 Hypothyroidism, unspecified: Secondary | ICD-10-CM | POA: Diagnosis not present

## 2023-11-19 DIAGNOSIS — E785 Hyperlipidemia, unspecified: Secondary | ICD-10-CM | POA: Diagnosis not present

## 2023-11-19 DIAGNOSIS — G2581 Restless legs syndrome: Secondary | ICD-10-CM | POA: Diagnosis not present

## 2023-11-20 DIAGNOSIS — R1312 Dysphagia, oropharyngeal phase: Secondary | ICD-10-CM | POA: Diagnosis not present

## 2023-11-21 DIAGNOSIS — E785 Hyperlipidemia, unspecified: Secondary | ICD-10-CM | POA: Diagnosis not present

## 2023-11-21 DIAGNOSIS — G2581 Restless legs syndrome: Secondary | ICD-10-CM | POA: Diagnosis not present

## 2023-11-21 DIAGNOSIS — E039 Hypothyroidism, unspecified: Secondary | ICD-10-CM | POA: Diagnosis not present

## 2023-11-21 DIAGNOSIS — N186 End stage renal disease: Secondary | ICD-10-CM | POA: Diagnosis not present

## 2023-11-21 DIAGNOSIS — K219 Gastro-esophageal reflux disease without esophagitis: Secondary | ICD-10-CM | POA: Diagnosis not present

## 2023-11-21 DIAGNOSIS — F32A Depression, unspecified: Secondary | ICD-10-CM | POA: Diagnosis not present

## 2023-11-24 DIAGNOSIS — E039 Hypothyroidism, unspecified: Secondary | ICD-10-CM | POA: Diagnosis not present

## 2023-11-24 DIAGNOSIS — F32A Depression, unspecified: Secondary | ICD-10-CM | POA: Diagnosis not present

## 2023-11-24 DIAGNOSIS — K219 Gastro-esophageal reflux disease without esophagitis: Secondary | ICD-10-CM | POA: Diagnosis not present

## 2023-11-24 DIAGNOSIS — N186 End stage renal disease: Secondary | ICD-10-CM | POA: Diagnosis not present

## 2023-11-24 DIAGNOSIS — G2581 Restless legs syndrome: Secondary | ICD-10-CM | POA: Diagnosis not present

## 2023-11-24 DIAGNOSIS — E785 Hyperlipidemia, unspecified: Secondary | ICD-10-CM | POA: Diagnosis not present

## 2023-11-25 DIAGNOSIS — R1312 Dysphagia, oropharyngeal phase: Secondary | ICD-10-CM | POA: Diagnosis not present

## 2023-11-26 DIAGNOSIS — N186 End stage renal disease: Secondary | ICD-10-CM | POA: Diagnosis not present

## 2023-11-26 DIAGNOSIS — E039 Hypothyroidism, unspecified: Secondary | ICD-10-CM | POA: Diagnosis not present

## 2023-11-26 DIAGNOSIS — R6521 Severe sepsis with septic shock: Secondary | ICD-10-CM | POA: Diagnosis not present

## 2023-11-26 DIAGNOSIS — A0472 Enterocolitis due to Clostridium difficile, not specified as recurrent: Secondary | ICD-10-CM | POA: Diagnosis not present

## 2023-11-26 DIAGNOSIS — Z9981 Dependence on supplemental oxygen: Secondary | ICD-10-CM | POA: Diagnosis not present

## 2023-11-26 DIAGNOSIS — G2581 Restless legs syndrome: Secondary | ICD-10-CM | POA: Diagnosis not present

## 2023-11-26 DIAGNOSIS — K219 Gastro-esophageal reflux disease without esophagitis: Secondary | ICD-10-CM | POA: Diagnosis not present

## 2023-11-26 DIAGNOSIS — N390 Urinary tract infection, site not specified: Secondary | ICD-10-CM | POA: Diagnosis not present

## 2023-11-26 DIAGNOSIS — N179 Acute kidney failure, unspecified: Secondary | ICD-10-CM | POA: Diagnosis not present

## 2023-11-26 DIAGNOSIS — F32A Depression, unspecified: Secondary | ICD-10-CM | POA: Diagnosis not present

## 2023-11-26 DIAGNOSIS — E785 Hyperlipidemia, unspecified: Secondary | ICD-10-CM | POA: Diagnosis not present

## 2023-11-26 DIAGNOSIS — R1312 Dysphagia, oropharyngeal phase: Secondary | ICD-10-CM | POA: Diagnosis not present

## 2023-11-27 DIAGNOSIS — R1312 Dysphagia, oropharyngeal phase: Secondary | ICD-10-CM | POA: Diagnosis not present

## 2023-11-28 DIAGNOSIS — N186 End stage renal disease: Secondary | ICD-10-CM | POA: Diagnosis not present

## 2023-11-28 DIAGNOSIS — G2581 Restless legs syndrome: Secondary | ICD-10-CM | POA: Diagnosis not present

## 2023-11-28 DIAGNOSIS — R1312 Dysphagia, oropharyngeal phase: Secondary | ICD-10-CM | POA: Diagnosis not present

## 2023-11-28 DIAGNOSIS — F32A Depression, unspecified: Secondary | ICD-10-CM | POA: Diagnosis not present

## 2023-11-28 DIAGNOSIS — E785 Hyperlipidemia, unspecified: Secondary | ICD-10-CM | POA: Diagnosis not present

## 2023-11-28 DIAGNOSIS — K219 Gastro-esophageal reflux disease without esophagitis: Secondary | ICD-10-CM | POA: Diagnosis not present

## 2023-11-28 DIAGNOSIS — E039 Hypothyroidism, unspecified: Secondary | ICD-10-CM | POA: Diagnosis not present

## 2023-11-30 DIAGNOSIS — R1312 Dysphagia, oropharyngeal phase: Secondary | ICD-10-CM | POA: Diagnosis not present

## 2023-12-01 DIAGNOSIS — K219 Gastro-esophageal reflux disease without esophagitis: Secondary | ICD-10-CM | POA: Diagnosis not present

## 2023-12-01 DIAGNOSIS — G2581 Restless legs syndrome: Secondary | ICD-10-CM | POA: Diagnosis not present

## 2023-12-01 DIAGNOSIS — E785 Hyperlipidemia, unspecified: Secondary | ICD-10-CM | POA: Diagnosis not present

## 2023-12-01 DIAGNOSIS — F32A Depression, unspecified: Secondary | ICD-10-CM | POA: Diagnosis not present

## 2023-12-01 DIAGNOSIS — N186 End stage renal disease: Secondary | ICD-10-CM | POA: Diagnosis not present

## 2023-12-01 DIAGNOSIS — E039 Hypothyroidism, unspecified: Secondary | ICD-10-CM | POA: Diagnosis not present

## 2023-12-02 DIAGNOSIS — N179 Acute kidney failure, unspecified: Secondary | ICD-10-CM | POA: Diagnosis not present

## 2023-12-02 DIAGNOSIS — Z9981 Dependence on supplemental oxygen: Secondary | ICD-10-CM | POA: Diagnosis not present

## 2023-12-02 DIAGNOSIS — Z515 Encounter for palliative care: Secondary | ICD-10-CM | POA: Diagnosis not present

## 2023-12-02 DIAGNOSIS — R1312 Dysphagia, oropharyngeal phase: Secondary | ICD-10-CM | POA: Diagnosis not present

## 2023-12-02 DIAGNOSIS — A0472 Enterocolitis due to Clostridium difficile, not specified as recurrent: Secondary | ICD-10-CM | POA: Diagnosis not present

## 2023-12-02 DIAGNOSIS — N390 Urinary tract infection, site not specified: Secondary | ICD-10-CM | POA: Diagnosis not present

## 2023-12-03 DIAGNOSIS — E039 Hypothyroidism, unspecified: Secondary | ICD-10-CM | POA: Diagnosis not present

## 2023-12-03 DIAGNOSIS — E785 Hyperlipidemia, unspecified: Secondary | ICD-10-CM | POA: Diagnosis not present

## 2023-12-03 DIAGNOSIS — F32A Depression, unspecified: Secondary | ICD-10-CM | POA: Diagnosis not present

## 2023-12-03 DIAGNOSIS — N186 End stage renal disease: Secondary | ICD-10-CM | POA: Diagnosis not present

## 2023-12-03 DIAGNOSIS — G2581 Restless legs syndrome: Secondary | ICD-10-CM | POA: Diagnosis not present

## 2023-12-03 DIAGNOSIS — K219 Gastro-esophageal reflux disease without esophagitis: Secondary | ICD-10-CM | POA: Diagnosis not present

## 2023-12-04 DIAGNOSIS — R1312 Dysphagia, oropharyngeal phase: Secondary | ICD-10-CM | POA: Diagnosis not present

## 2023-12-05 DIAGNOSIS — K219 Gastro-esophageal reflux disease without esophagitis: Secondary | ICD-10-CM | POA: Diagnosis not present

## 2023-12-05 DIAGNOSIS — N186 End stage renal disease: Secondary | ICD-10-CM | POA: Diagnosis not present

## 2023-12-05 DIAGNOSIS — F32A Depression, unspecified: Secondary | ICD-10-CM | POA: Diagnosis not present

## 2023-12-05 DIAGNOSIS — E039 Hypothyroidism, unspecified: Secondary | ICD-10-CM | POA: Diagnosis not present

## 2023-12-05 DIAGNOSIS — G2581 Restless legs syndrome: Secondary | ICD-10-CM | POA: Diagnosis not present

## 2023-12-05 DIAGNOSIS — R1312 Dysphagia, oropharyngeal phase: Secondary | ICD-10-CM | POA: Diagnosis not present

## 2023-12-05 DIAGNOSIS — E785 Hyperlipidemia, unspecified: Secondary | ICD-10-CM | POA: Diagnosis not present

## 2023-12-06 DIAGNOSIS — R1312 Dysphagia, oropharyngeal phase: Secondary | ICD-10-CM | POA: Diagnosis not present

## 2023-12-08 DIAGNOSIS — E039 Hypothyroidism, unspecified: Secondary | ICD-10-CM | POA: Diagnosis not present

## 2023-12-08 DIAGNOSIS — E785 Hyperlipidemia, unspecified: Secondary | ICD-10-CM | POA: Diagnosis not present

## 2023-12-08 DIAGNOSIS — G2581 Restless legs syndrome: Secondary | ICD-10-CM | POA: Diagnosis not present

## 2023-12-08 DIAGNOSIS — F32A Depression, unspecified: Secondary | ICD-10-CM | POA: Diagnosis not present

## 2023-12-08 DIAGNOSIS — K219 Gastro-esophageal reflux disease without esophagitis: Secondary | ICD-10-CM | POA: Diagnosis not present

## 2023-12-08 DIAGNOSIS — N186 End stage renal disease: Secondary | ICD-10-CM | POA: Diagnosis not present

## 2023-12-09 DIAGNOSIS — B354 Tinea corporis: Secondary | ICD-10-CM | POA: Diagnosis not present

## 2023-12-09 DIAGNOSIS — Z515 Encounter for palliative care: Secondary | ICD-10-CM | POA: Diagnosis not present

## 2023-12-10 DIAGNOSIS — K219 Gastro-esophageal reflux disease without esophagitis: Secondary | ICD-10-CM | POA: Diagnosis not present

## 2023-12-10 DIAGNOSIS — F32A Depression, unspecified: Secondary | ICD-10-CM | POA: Diagnosis not present

## 2023-12-10 DIAGNOSIS — G2581 Restless legs syndrome: Secondary | ICD-10-CM | POA: Diagnosis not present

## 2023-12-10 DIAGNOSIS — E039 Hypothyroidism, unspecified: Secondary | ICD-10-CM | POA: Diagnosis not present

## 2023-12-10 DIAGNOSIS — N186 End stage renal disease: Secondary | ICD-10-CM | POA: Diagnosis not present

## 2023-12-10 DIAGNOSIS — E785 Hyperlipidemia, unspecified: Secondary | ICD-10-CM | POA: Diagnosis not present

## 2023-12-11 DIAGNOSIS — N179 Acute kidney failure, unspecified: Secondary | ICD-10-CM | POA: Diagnosis not present

## 2023-12-11 DIAGNOSIS — G2581 Restless legs syndrome: Secondary | ICD-10-CM | POA: Diagnosis not present

## 2023-12-11 DIAGNOSIS — Z9981 Dependence on supplemental oxygen: Secondary | ICD-10-CM | POA: Diagnosis not present

## 2023-12-11 DIAGNOSIS — R1312 Dysphagia, oropharyngeal phase: Secondary | ICD-10-CM | POA: Diagnosis not present

## 2023-12-11 DIAGNOSIS — E039 Hypothyroidism, unspecified: Secondary | ICD-10-CM | POA: Diagnosis not present

## 2023-12-11 DIAGNOSIS — Z515 Encounter for palliative care: Secondary | ICD-10-CM | POA: Diagnosis not present

## 2023-12-11 DIAGNOSIS — F32A Depression, unspecified: Secondary | ICD-10-CM | POA: Diagnosis not present

## 2023-12-11 DIAGNOSIS — N186 End stage renal disease: Secondary | ICD-10-CM | POA: Diagnosis not present

## 2023-12-11 DIAGNOSIS — A0472 Enterocolitis due to Clostridium difficile, not specified as recurrent: Secondary | ICD-10-CM | POA: Diagnosis not present

## 2023-12-11 DIAGNOSIS — K219 Gastro-esophageal reflux disease without esophagitis: Secondary | ICD-10-CM | POA: Diagnosis not present

## 2023-12-11 DIAGNOSIS — E785 Hyperlipidemia, unspecified: Secondary | ICD-10-CM | POA: Diagnosis not present

## 2023-12-11 DIAGNOSIS — N390 Urinary tract infection, site not specified: Secondary | ICD-10-CM | POA: Diagnosis not present

## 2023-12-12 DIAGNOSIS — E039 Hypothyroidism, unspecified: Secondary | ICD-10-CM | POA: Diagnosis not present

## 2023-12-12 DIAGNOSIS — R1312 Dysphagia, oropharyngeal phase: Secondary | ICD-10-CM | POA: Diagnosis not present

## 2023-12-12 DIAGNOSIS — G2581 Restless legs syndrome: Secondary | ICD-10-CM | POA: Diagnosis not present

## 2023-12-12 DIAGNOSIS — K219 Gastro-esophageal reflux disease without esophagitis: Secondary | ICD-10-CM | POA: Diagnosis not present

## 2023-12-12 DIAGNOSIS — F32A Depression, unspecified: Secondary | ICD-10-CM | POA: Diagnosis not present

## 2023-12-12 DIAGNOSIS — E785 Hyperlipidemia, unspecified: Secondary | ICD-10-CM | POA: Diagnosis not present

## 2023-12-12 DIAGNOSIS — N186 End stage renal disease: Secondary | ICD-10-CM | POA: Diagnosis not present

## 2023-12-13 DIAGNOSIS — R1312 Dysphagia, oropharyngeal phase: Secondary | ICD-10-CM | POA: Diagnosis not present

## 2023-12-14 DIAGNOSIS — R1312 Dysphagia, oropharyngeal phase: Secondary | ICD-10-CM | POA: Diagnosis not present

## 2023-12-15 DIAGNOSIS — F32A Depression, unspecified: Secondary | ICD-10-CM | POA: Diagnosis not present

## 2023-12-15 DIAGNOSIS — K219 Gastro-esophageal reflux disease without esophagitis: Secondary | ICD-10-CM | POA: Diagnosis not present

## 2023-12-15 DIAGNOSIS — G2581 Restless legs syndrome: Secondary | ICD-10-CM | POA: Diagnosis not present

## 2023-12-15 DIAGNOSIS — E785 Hyperlipidemia, unspecified: Secondary | ICD-10-CM | POA: Diagnosis not present

## 2023-12-15 DIAGNOSIS — N186 End stage renal disease: Secondary | ICD-10-CM | POA: Diagnosis not present

## 2023-12-15 DIAGNOSIS — E039 Hypothyroidism, unspecified: Secondary | ICD-10-CM | POA: Diagnosis not present

## 2023-12-16 DIAGNOSIS — K219 Gastro-esophageal reflux disease without esophagitis: Secondary | ICD-10-CM | POA: Diagnosis not present

## 2023-12-16 DIAGNOSIS — E039 Hypothyroidism, unspecified: Secondary | ICD-10-CM | POA: Diagnosis not present

## 2023-12-16 DIAGNOSIS — E785 Hyperlipidemia, unspecified: Secondary | ICD-10-CM | POA: Diagnosis not present

## 2023-12-16 DIAGNOSIS — Z0000 Encounter for general adult medical examination without abnormal findings: Secondary | ICD-10-CM | POA: Diagnosis not present

## 2023-12-16 DIAGNOSIS — N186 End stage renal disease: Secondary | ICD-10-CM | POA: Diagnosis not present

## 2023-12-16 DIAGNOSIS — F32A Depression, unspecified: Secondary | ICD-10-CM | POA: Diagnosis not present

## 2023-12-16 DIAGNOSIS — G2581 Restless legs syndrome: Secondary | ICD-10-CM | POA: Diagnosis not present

## 2023-12-18 DIAGNOSIS — N186 End stage renal disease: Secondary | ICD-10-CM | POA: Diagnosis not present

## 2023-12-18 DIAGNOSIS — E039 Hypothyroidism, unspecified: Secondary | ICD-10-CM | POA: Diagnosis not present

## 2023-12-18 DIAGNOSIS — E785 Hyperlipidemia, unspecified: Secondary | ICD-10-CM | POA: Diagnosis not present

## 2023-12-18 DIAGNOSIS — G2581 Restless legs syndrome: Secondary | ICD-10-CM | POA: Diagnosis not present

## 2023-12-18 DIAGNOSIS — F32A Depression, unspecified: Secondary | ICD-10-CM | POA: Diagnosis not present

## 2023-12-18 DIAGNOSIS — K219 Gastro-esophageal reflux disease without esophagitis: Secondary | ICD-10-CM | POA: Diagnosis not present

## 2023-12-19 DIAGNOSIS — K219 Gastro-esophageal reflux disease without esophagitis: Secondary | ICD-10-CM | POA: Diagnosis not present

## 2023-12-19 DIAGNOSIS — E039 Hypothyroidism, unspecified: Secondary | ICD-10-CM | POA: Diagnosis not present

## 2023-12-19 DIAGNOSIS — E785 Hyperlipidemia, unspecified: Secondary | ICD-10-CM | POA: Diagnosis not present

## 2023-12-19 DIAGNOSIS — G2581 Restless legs syndrome: Secondary | ICD-10-CM | POA: Diagnosis not present

## 2023-12-19 DIAGNOSIS — N186 End stage renal disease: Secondary | ICD-10-CM | POA: Diagnosis not present

## 2023-12-19 DIAGNOSIS — F32A Depression, unspecified: Secondary | ICD-10-CM | POA: Diagnosis not present

## 2023-12-22 DIAGNOSIS — F32A Depression, unspecified: Secondary | ICD-10-CM | POA: Diagnosis not present

## 2023-12-22 DIAGNOSIS — K219 Gastro-esophageal reflux disease without esophagitis: Secondary | ICD-10-CM | POA: Diagnosis not present

## 2023-12-22 DIAGNOSIS — G2581 Restless legs syndrome: Secondary | ICD-10-CM | POA: Diagnosis not present

## 2023-12-22 DIAGNOSIS — E039 Hypothyroidism, unspecified: Secondary | ICD-10-CM | POA: Diagnosis not present

## 2023-12-22 DIAGNOSIS — N186 End stage renal disease: Secondary | ICD-10-CM | POA: Diagnosis not present

## 2023-12-22 DIAGNOSIS — E785 Hyperlipidemia, unspecified: Secondary | ICD-10-CM | POA: Diagnosis not present

## 2023-12-22 DIAGNOSIS — R1312 Dysphagia, oropharyngeal phase: Secondary | ICD-10-CM | POA: Diagnosis not present

## 2023-12-23 DIAGNOSIS — R1312 Dysphagia, oropharyngeal phase: Secondary | ICD-10-CM | POA: Diagnosis not present

## 2023-12-24 DIAGNOSIS — E785 Hyperlipidemia, unspecified: Secondary | ICD-10-CM | POA: Diagnosis not present

## 2023-12-24 DIAGNOSIS — E039 Hypothyroidism, unspecified: Secondary | ICD-10-CM | POA: Diagnosis not present

## 2023-12-24 DIAGNOSIS — K219 Gastro-esophageal reflux disease without esophagitis: Secondary | ICD-10-CM | POA: Diagnosis not present

## 2023-12-24 DIAGNOSIS — N186 End stage renal disease: Secondary | ICD-10-CM | POA: Diagnosis not present

## 2023-12-24 DIAGNOSIS — G2581 Restless legs syndrome: Secondary | ICD-10-CM | POA: Diagnosis not present

## 2023-12-24 DIAGNOSIS — F32A Depression, unspecified: Secondary | ICD-10-CM | POA: Diagnosis not present

## 2023-12-25 DIAGNOSIS — R1312 Dysphagia, oropharyngeal phase: Secondary | ICD-10-CM | POA: Diagnosis not present

## 2023-12-26 DIAGNOSIS — E039 Hypothyroidism, unspecified: Secondary | ICD-10-CM | POA: Diagnosis not present

## 2023-12-26 DIAGNOSIS — F32A Depression, unspecified: Secondary | ICD-10-CM | POA: Diagnosis not present

## 2023-12-26 DIAGNOSIS — K219 Gastro-esophageal reflux disease without esophagitis: Secondary | ICD-10-CM | POA: Diagnosis not present

## 2023-12-26 DIAGNOSIS — G2581 Restless legs syndrome: Secondary | ICD-10-CM | POA: Diagnosis not present

## 2023-12-26 DIAGNOSIS — N186 End stage renal disease: Secondary | ICD-10-CM | POA: Diagnosis not present

## 2023-12-26 DIAGNOSIS — E785 Hyperlipidemia, unspecified: Secondary | ICD-10-CM | POA: Diagnosis not present

## 2023-12-29 DIAGNOSIS — G2581 Restless legs syndrome: Secondary | ICD-10-CM | POA: Diagnosis not present

## 2023-12-29 DIAGNOSIS — N186 End stage renal disease: Secondary | ICD-10-CM | POA: Diagnosis not present

## 2023-12-29 DIAGNOSIS — E785 Hyperlipidemia, unspecified: Secondary | ICD-10-CM | POA: Diagnosis not present

## 2023-12-29 DIAGNOSIS — F32A Depression, unspecified: Secondary | ICD-10-CM | POA: Diagnosis not present

## 2023-12-29 DIAGNOSIS — E039 Hypothyroidism, unspecified: Secondary | ICD-10-CM | POA: Diagnosis not present

## 2023-12-29 DIAGNOSIS — K219 Gastro-esophageal reflux disease without esophagitis: Secondary | ICD-10-CM | POA: Diagnosis not present

## 2023-12-30 DIAGNOSIS — N179 Acute kidney failure, unspecified: Secondary | ICD-10-CM | POA: Diagnosis not present

## 2023-12-30 DIAGNOSIS — A0472 Enterocolitis due to Clostridium difficile, not specified as recurrent: Secondary | ICD-10-CM | POA: Diagnosis not present

## 2023-12-30 DIAGNOSIS — Z515 Encounter for palliative care: Secondary | ICD-10-CM | POA: Diagnosis not present

## 2023-12-30 DIAGNOSIS — N390 Urinary tract infection, site not specified: Secondary | ICD-10-CM | POA: Diagnosis not present

## 2023-12-30 DIAGNOSIS — Z9981 Dependence on supplemental oxygen: Secondary | ICD-10-CM | POA: Diagnosis not present

## 2023-12-31 DIAGNOSIS — N186 End stage renal disease: Secondary | ICD-10-CM | POA: Diagnosis not present

## 2023-12-31 DIAGNOSIS — K219 Gastro-esophageal reflux disease without esophagitis: Secondary | ICD-10-CM | POA: Diagnosis not present

## 2023-12-31 DIAGNOSIS — E785 Hyperlipidemia, unspecified: Secondary | ICD-10-CM | POA: Diagnosis not present

## 2023-12-31 DIAGNOSIS — E039 Hypothyroidism, unspecified: Secondary | ICD-10-CM | POA: Diagnosis not present

## 2023-12-31 DIAGNOSIS — F32A Depression, unspecified: Secondary | ICD-10-CM | POA: Diagnosis not present

## 2023-12-31 DIAGNOSIS — G2581 Restless legs syndrome: Secondary | ICD-10-CM | POA: Diagnosis not present

## 2024-01-01 DIAGNOSIS — K219 Gastro-esophageal reflux disease without esophagitis: Secondary | ICD-10-CM | POA: Diagnosis not present

## 2024-01-01 DIAGNOSIS — F32A Depression, unspecified: Secondary | ICD-10-CM | POA: Diagnosis not present

## 2024-01-01 DIAGNOSIS — E039 Hypothyroidism, unspecified: Secondary | ICD-10-CM | POA: Diagnosis not present

## 2024-01-01 DIAGNOSIS — E785 Hyperlipidemia, unspecified: Secondary | ICD-10-CM | POA: Diagnosis not present

## 2024-01-01 DIAGNOSIS — G2581 Restless legs syndrome: Secondary | ICD-10-CM | POA: Diagnosis not present

## 2024-01-01 DIAGNOSIS — N186 End stage renal disease: Secondary | ICD-10-CM | POA: Diagnosis not present

## 2024-01-02 DIAGNOSIS — N186 End stage renal disease: Secondary | ICD-10-CM | POA: Diagnosis not present

## 2024-01-02 DIAGNOSIS — G2581 Restless legs syndrome: Secondary | ICD-10-CM | POA: Diagnosis not present

## 2024-01-02 DIAGNOSIS — F32A Depression, unspecified: Secondary | ICD-10-CM | POA: Diagnosis not present

## 2024-01-02 DIAGNOSIS — E785 Hyperlipidemia, unspecified: Secondary | ICD-10-CM | POA: Diagnosis not present

## 2024-01-02 DIAGNOSIS — E039 Hypothyroidism, unspecified: Secondary | ICD-10-CM | POA: Diagnosis not present

## 2024-01-02 DIAGNOSIS — K219 Gastro-esophageal reflux disease without esophagitis: Secondary | ICD-10-CM | POA: Diagnosis not present

## 2024-01-05 DIAGNOSIS — K219 Gastro-esophageal reflux disease without esophagitis: Secondary | ICD-10-CM | POA: Diagnosis not present

## 2024-01-05 DIAGNOSIS — G2581 Restless legs syndrome: Secondary | ICD-10-CM | POA: Diagnosis not present

## 2024-01-05 DIAGNOSIS — F32A Depression, unspecified: Secondary | ICD-10-CM | POA: Diagnosis not present

## 2024-01-05 DIAGNOSIS — E785 Hyperlipidemia, unspecified: Secondary | ICD-10-CM | POA: Diagnosis not present

## 2024-01-05 DIAGNOSIS — N186 End stage renal disease: Secondary | ICD-10-CM | POA: Diagnosis not present

## 2024-01-05 DIAGNOSIS — E039 Hypothyroidism, unspecified: Secondary | ICD-10-CM | POA: Diagnosis not present

## 2024-01-07 DIAGNOSIS — G2581 Restless legs syndrome: Secondary | ICD-10-CM | POA: Diagnosis not present

## 2024-01-07 DIAGNOSIS — K219 Gastro-esophageal reflux disease without esophagitis: Secondary | ICD-10-CM | POA: Diagnosis not present

## 2024-01-07 DIAGNOSIS — N186 End stage renal disease: Secondary | ICD-10-CM | POA: Diagnosis not present

## 2024-01-07 DIAGNOSIS — F32A Depression, unspecified: Secondary | ICD-10-CM | POA: Diagnosis not present

## 2024-01-07 DIAGNOSIS — E039 Hypothyroidism, unspecified: Secondary | ICD-10-CM | POA: Diagnosis not present

## 2024-01-07 DIAGNOSIS — E785 Hyperlipidemia, unspecified: Secondary | ICD-10-CM | POA: Diagnosis not present

## 2024-01-08 DIAGNOSIS — N179 Acute kidney failure, unspecified: Secondary | ICD-10-CM | POA: Diagnosis not present

## 2024-01-08 DIAGNOSIS — Z9981 Dependence on supplemental oxygen: Secondary | ICD-10-CM | POA: Diagnosis not present

## 2024-01-08 DIAGNOSIS — A0472 Enterocolitis due to Clostridium difficile, not specified as recurrent: Secondary | ICD-10-CM | POA: Diagnosis not present

## 2024-01-08 DIAGNOSIS — N390 Urinary tract infection, site not specified: Secondary | ICD-10-CM | POA: Diagnosis not present

## 2024-01-10 DIAGNOSIS — E039 Hypothyroidism, unspecified: Secondary | ICD-10-CM | POA: Diagnosis not present

## 2024-01-10 DIAGNOSIS — E785 Hyperlipidemia, unspecified: Secondary | ICD-10-CM | POA: Diagnosis not present

## 2024-01-10 DIAGNOSIS — K219 Gastro-esophageal reflux disease without esophagitis: Secondary | ICD-10-CM | POA: Diagnosis not present

## 2024-01-10 DIAGNOSIS — G2581 Restless legs syndrome: Secondary | ICD-10-CM | POA: Diagnosis not present

## 2024-01-10 DIAGNOSIS — F32A Depression, unspecified: Secondary | ICD-10-CM | POA: Diagnosis not present

## 2024-01-10 DIAGNOSIS — N186 End stage renal disease: Secondary | ICD-10-CM | POA: Diagnosis not present

## 2024-01-12 DIAGNOSIS — K219 Gastro-esophageal reflux disease without esophagitis: Secondary | ICD-10-CM | POA: Diagnosis not present

## 2024-01-12 DIAGNOSIS — E785 Hyperlipidemia, unspecified: Secondary | ICD-10-CM | POA: Diagnosis not present

## 2024-01-12 DIAGNOSIS — G2581 Restless legs syndrome: Secondary | ICD-10-CM | POA: Diagnosis not present

## 2024-01-12 DIAGNOSIS — E039 Hypothyroidism, unspecified: Secondary | ICD-10-CM | POA: Diagnosis not present

## 2024-01-12 DIAGNOSIS — F32A Depression, unspecified: Secondary | ICD-10-CM | POA: Diagnosis not present

## 2024-01-12 DIAGNOSIS — N186 End stage renal disease: Secondary | ICD-10-CM | POA: Diagnosis not present

## 2024-01-14 DIAGNOSIS — G2581 Restless legs syndrome: Secondary | ICD-10-CM | POA: Diagnosis not present

## 2024-01-14 DIAGNOSIS — E785 Hyperlipidemia, unspecified: Secondary | ICD-10-CM | POA: Diagnosis not present

## 2024-01-14 DIAGNOSIS — F32A Depression, unspecified: Secondary | ICD-10-CM | POA: Diagnosis not present

## 2024-01-14 DIAGNOSIS — N186 End stage renal disease: Secondary | ICD-10-CM | POA: Diagnosis not present

## 2024-01-14 DIAGNOSIS — E039 Hypothyroidism, unspecified: Secondary | ICD-10-CM | POA: Diagnosis not present

## 2024-01-14 DIAGNOSIS — K219 Gastro-esophageal reflux disease without esophagitis: Secondary | ICD-10-CM | POA: Diagnosis not present

## 2024-01-15 DIAGNOSIS — E785 Hyperlipidemia, unspecified: Secondary | ICD-10-CM | POA: Diagnosis not present

## 2024-01-15 DIAGNOSIS — F32A Depression, unspecified: Secondary | ICD-10-CM | POA: Diagnosis not present

## 2024-01-15 DIAGNOSIS — G2581 Restless legs syndrome: Secondary | ICD-10-CM | POA: Diagnosis not present

## 2024-01-15 DIAGNOSIS — N186 End stage renal disease: Secondary | ICD-10-CM | POA: Diagnosis not present

## 2024-01-15 DIAGNOSIS — K219 Gastro-esophageal reflux disease without esophagitis: Secondary | ICD-10-CM | POA: Diagnosis not present

## 2024-01-15 DIAGNOSIS — E039 Hypothyroidism, unspecified: Secondary | ICD-10-CM | POA: Diagnosis not present

## 2024-01-16 DIAGNOSIS — G2581 Restless legs syndrome: Secondary | ICD-10-CM | POA: Diagnosis not present

## 2024-01-16 DIAGNOSIS — E785 Hyperlipidemia, unspecified: Secondary | ICD-10-CM | POA: Diagnosis not present

## 2024-01-16 DIAGNOSIS — K219 Gastro-esophageal reflux disease without esophagitis: Secondary | ICD-10-CM | POA: Diagnosis not present

## 2024-01-16 DIAGNOSIS — F32A Depression, unspecified: Secondary | ICD-10-CM | POA: Diagnosis not present

## 2024-01-16 DIAGNOSIS — N186 End stage renal disease: Secondary | ICD-10-CM | POA: Diagnosis not present

## 2024-01-16 DIAGNOSIS — E039 Hypothyroidism, unspecified: Secondary | ICD-10-CM | POA: Diagnosis not present

## 2024-01-19 DIAGNOSIS — F32A Depression, unspecified: Secondary | ICD-10-CM | POA: Diagnosis not present

## 2024-01-19 DIAGNOSIS — K219 Gastro-esophageal reflux disease without esophagitis: Secondary | ICD-10-CM | POA: Diagnosis not present

## 2024-01-19 DIAGNOSIS — E039 Hypothyroidism, unspecified: Secondary | ICD-10-CM | POA: Diagnosis not present

## 2024-01-19 DIAGNOSIS — G2581 Restless legs syndrome: Secondary | ICD-10-CM | POA: Diagnosis not present

## 2024-01-19 DIAGNOSIS — N186 End stage renal disease: Secondary | ICD-10-CM | POA: Diagnosis not present

## 2024-01-19 DIAGNOSIS — E785 Hyperlipidemia, unspecified: Secondary | ICD-10-CM | POA: Diagnosis not present

## 2024-01-21 DIAGNOSIS — N186 End stage renal disease: Secondary | ICD-10-CM | POA: Diagnosis not present

## 2024-01-21 DIAGNOSIS — K219 Gastro-esophageal reflux disease without esophagitis: Secondary | ICD-10-CM | POA: Diagnosis not present

## 2024-01-21 DIAGNOSIS — F32A Depression, unspecified: Secondary | ICD-10-CM | POA: Diagnosis not present

## 2024-01-21 DIAGNOSIS — G2581 Restless legs syndrome: Secondary | ICD-10-CM | POA: Diagnosis not present

## 2024-01-21 DIAGNOSIS — E785 Hyperlipidemia, unspecified: Secondary | ICD-10-CM | POA: Diagnosis not present

## 2024-01-21 DIAGNOSIS — E039 Hypothyroidism, unspecified: Secondary | ICD-10-CM | POA: Diagnosis not present

## 2024-01-23 DIAGNOSIS — G2581 Restless legs syndrome: Secondary | ICD-10-CM | POA: Diagnosis not present

## 2024-01-23 DIAGNOSIS — N186 End stage renal disease: Secondary | ICD-10-CM | POA: Diagnosis not present

## 2024-01-23 DIAGNOSIS — E785 Hyperlipidemia, unspecified: Secondary | ICD-10-CM | POA: Diagnosis not present

## 2024-01-23 DIAGNOSIS — K219 Gastro-esophageal reflux disease without esophagitis: Secondary | ICD-10-CM | POA: Diagnosis not present

## 2024-01-23 DIAGNOSIS — F32A Depression, unspecified: Secondary | ICD-10-CM | POA: Diagnosis not present

## 2024-01-23 DIAGNOSIS — E039 Hypothyroidism, unspecified: Secondary | ICD-10-CM | POA: Diagnosis not present

## 2024-01-26 DIAGNOSIS — E785 Hyperlipidemia, unspecified: Secondary | ICD-10-CM | POA: Diagnosis not present

## 2024-01-26 DIAGNOSIS — G2581 Restless legs syndrome: Secondary | ICD-10-CM | POA: Diagnosis not present

## 2024-01-26 DIAGNOSIS — F32A Depression, unspecified: Secondary | ICD-10-CM | POA: Diagnosis not present

## 2024-01-26 DIAGNOSIS — E039 Hypothyroidism, unspecified: Secondary | ICD-10-CM | POA: Diagnosis not present

## 2024-01-26 DIAGNOSIS — K219 Gastro-esophageal reflux disease without esophagitis: Secondary | ICD-10-CM | POA: Diagnosis not present

## 2024-01-26 DIAGNOSIS — N186 End stage renal disease: Secondary | ICD-10-CM | POA: Diagnosis not present

## 2024-01-27 DIAGNOSIS — E039 Hypothyroidism, unspecified: Secondary | ICD-10-CM | POA: Diagnosis not present

## 2024-01-27 DIAGNOSIS — Z515 Encounter for palliative care: Secondary | ICD-10-CM | POA: Diagnosis not present

## 2024-01-27 DIAGNOSIS — K219 Gastro-esophageal reflux disease without esophagitis: Secondary | ICD-10-CM | POA: Diagnosis not present

## 2024-01-27 DIAGNOSIS — A0472 Enterocolitis due to Clostridium difficile, not specified as recurrent: Secondary | ICD-10-CM | POA: Diagnosis not present

## 2024-01-27 DIAGNOSIS — N1832 Chronic kidney disease, stage 3b: Secondary | ICD-10-CM | POA: Diagnosis not present

## 2024-01-28 DIAGNOSIS — K219 Gastro-esophageal reflux disease without esophagitis: Secondary | ICD-10-CM | POA: Diagnosis not present

## 2024-01-28 DIAGNOSIS — E039 Hypothyroidism, unspecified: Secondary | ICD-10-CM | POA: Diagnosis not present

## 2024-01-28 DIAGNOSIS — G2581 Restless legs syndrome: Secondary | ICD-10-CM | POA: Diagnosis not present

## 2024-01-28 DIAGNOSIS — E785 Hyperlipidemia, unspecified: Secondary | ICD-10-CM | POA: Diagnosis not present

## 2024-01-28 DIAGNOSIS — N186 End stage renal disease: Secondary | ICD-10-CM | POA: Diagnosis not present

## 2024-01-28 DIAGNOSIS — F32A Depression, unspecified: Secondary | ICD-10-CM | POA: Diagnosis not present

## 2024-01-30 DIAGNOSIS — G2581 Restless legs syndrome: Secondary | ICD-10-CM | POA: Diagnosis not present

## 2024-01-30 DIAGNOSIS — E785 Hyperlipidemia, unspecified: Secondary | ICD-10-CM | POA: Diagnosis not present

## 2024-01-30 DIAGNOSIS — N186 End stage renal disease: Secondary | ICD-10-CM | POA: Diagnosis not present

## 2024-01-30 DIAGNOSIS — F32A Depression, unspecified: Secondary | ICD-10-CM | POA: Diagnosis not present

## 2024-01-30 DIAGNOSIS — E039 Hypothyroidism, unspecified: Secondary | ICD-10-CM | POA: Diagnosis not present

## 2024-01-30 DIAGNOSIS — K219 Gastro-esophageal reflux disease without esophagitis: Secondary | ICD-10-CM | POA: Diagnosis not present

## 2024-02-02 DIAGNOSIS — G2581 Restless legs syndrome: Secondary | ICD-10-CM | POA: Diagnosis not present

## 2024-02-02 DIAGNOSIS — K219 Gastro-esophageal reflux disease without esophagitis: Secondary | ICD-10-CM | POA: Diagnosis not present

## 2024-02-02 DIAGNOSIS — E785 Hyperlipidemia, unspecified: Secondary | ICD-10-CM | POA: Diagnosis not present

## 2024-02-02 DIAGNOSIS — N186 End stage renal disease: Secondary | ICD-10-CM | POA: Diagnosis not present

## 2024-02-02 DIAGNOSIS — F32A Depression, unspecified: Secondary | ICD-10-CM | POA: Diagnosis not present

## 2024-02-02 DIAGNOSIS — E039 Hypothyroidism, unspecified: Secondary | ICD-10-CM | POA: Diagnosis not present

## 2024-02-03 DIAGNOSIS — G2581 Restless legs syndrome: Secondary | ICD-10-CM | POA: Diagnosis not present

## 2024-02-03 DIAGNOSIS — K219 Gastro-esophageal reflux disease without esophagitis: Secondary | ICD-10-CM | POA: Diagnosis not present

## 2024-02-03 DIAGNOSIS — E039 Hypothyroidism, unspecified: Secondary | ICD-10-CM | POA: Diagnosis not present

## 2024-02-03 DIAGNOSIS — E785 Hyperlipidemia, unspecified: Secondary | ICD-10-CM | POA: Diagnosis not present

## 2024-02-03 DIAGNOSIS — F32A Depression, unspecified: Secondary | ICD-10-CM | POA: Diagnosis not present

## 2024-02-03 DIAGNOSIS — N186 End stage renal disease: Secondary | ICD-10-CM | POA: Diagnosis not present

## 2024-02-04 DIAGNOSIS — E039 Hypothyroidism, unspecified: Secondary | ICD-10-CM | POA: Diagnosis not present

## 2024-02-04 DIAGNOSIS — E785 Hyperlipidemia, unspecified: Secondary | ICD-10-CM | POA: Diagnosis not present

## 2024-02-04 DIAGNOSIS — K219 Gastro-esophageal reflux disease without esophagitis: Secondary | ICD-10-CM | POA: Diagnosis not present

## 2024-02-04 DIAGNOSIS — G2581 Restless legs syndrome: Secondary | ICD-10-CM | POA: Diagnosis not present

## 2024-02-04 DIAGNOSIS — N186 End stage renal disease: Secondary | ICD-10-CM | POA: Diagnosis not present

## 2024-02-04 DIAGNOSIS — F32A Depression, unspecified: Secondary | ICD-10-CM | POA: Diagnosis not present

## 2024-02-06 DIAGNOSIS — E785 Hyperlipidemia, unspecified: Secondary | ICD-10-CM | POA: Diagnosis not present

## 2024-02-06 DIAGNOSIS — N186 End stage renal disease: Secondary | ICD-10-CM | POA: Diagnosis not present

## 2024-02-06 DIAGNOSIS — E039 Hypothyroidism, unspecified: Secondary | ICD-10-CM | POA: Diagnosis not present

## 2024-02-06 DIAGNOSIS — F32A Depression, unspecified: Secondary | ICD-10-CM | POA: Diagnosis not present

## 2024-02-06 DIAGNOSIS — K219 Gastro-esophageal reflux disease without esophagitis: Secondary | ICD-10-CM | POA: Diagnosis not present

## 2024-02-06 DIAGNOSIS — G2581 Restless legs syndrome: Secondary | ICD-10-CM | POA: Diagnosis not present

## 2024-02-10 DIAGNOSIS — K219 Gastro-esophageal reflux disease without esophagitis: Secondary | ICD-10-CM | POA: Diagnosis not present

## 2024-02-10 DIAGNOSIS — E039 Hypothyroidism, unspecified: Secondary | ICD-10-CM | POA: Diagnosis not present

## 2024-02-10 DIAGNOSIS — N186 End stage renal disease: Secondary | ICD-10-CM | POA: Diagnosis not present

## 2024-02-10 DIAGNOSIS — G2581 Restless legs syndrome: Secondary | ICD-10-CM | POA: Diagnosis not present

## 2024-02-10 DIAGNOSIS — F32A Depression, unspecified: Secondary | ICD-10-CM | POA: Diagnosis not present

## 2024-02-10 DIAGNOSIS — A0472 Enterocolitis due to Clostridium difficile, not specified as recurrent: Secondary | ICD-10-CM | POA: Diagnosis not present

## 2024-02-10 DIAGNOSIS — N1832 Chronic kidney disease, stage 3b: Secondary | ICD-10-CM | POA: Diagnosis not present

## 2024-02-10 DIAGNOSIS — E785 Hyperlipidemia, unspecified: Secondary | ICD-10-CM | POA: Diagnosis not present

## 2024-02-11 DIAGNOSIS — G2581 Restless legs syndrome: Secondary | ICD-10-CM | POA: Diagnosis not present

## 2024-02-11 DIAGNOSIS — F32A Depression, unspecified: Secondary | ICD-10-CM | POA: Diagnosis not present

## 2024-02-11 DIAGNOSIS — N186 End stage renal disease: Secondary | ICD-10-CM | POA: Diagnosis not present

## 2024-02-11 DIAGNOSIS — K219 Gastro-esophageal reflux disease without esophagitis: Secondary | ICD-10-CM | POA: Diagnosis not present

## 2024-02-11 DIAGNOSIS — E039 Hypothyroidism, unspecified: Secondary | ICD-10-CM | POA: Diagnosis not present

## 2024-02-11 DIAGNOSIS — E785 Hyperlipidemia, unspecified: Secondary | ICD-10-CM | POA: Diagnosis not present

## 2024-02-12 DIAGNOSIS — G4700 Insomnia, unspecified: Secondary | ICD-10-CM | POA: Diagnosis not present

## 2024-02-12 DIAGNOSIS — F331 Major depressive disorder, recurrent, moderate: Secondary | ICD-10-CM | POA: Diagnosis not present

## 2024-02-12 DIAGNOSIS — G3184 Mild cognitive impairment, so stated: Secondary | ICD-10-CM | POA: Diagnosis not present

## 2024-02-14 DIAGNOSIS — E039 Hypothyroidism, unspecified: Secondary | ICD-10-CM | POA: Diagnosis not present

## 2024-02-14 DIAGNOSIS — F32A Depression, unspecified: Secondary | ICD-10-CM | POA: Diagnosis not present

## 2024-02-14 DIAGNOSIS — N186 End stage renal disease: Secondary | ICD-10-CM | POA: Diagnosis not present

## 2024-02-14 DIAGNOSIS — E785 Hyperlipidemia, unspecified: Secondary | ICD-10-CM | POA: Diagnosis not present

## 2024-02-14 DIAGNOSIS — K219 Gastro-esophageal reflux disease without esophagitis: Secondary | ICD-10-CM | POA: Diagnosis not present

## 2024-02-14 DIAGNOSIS — G2581 Restless legs syndrome: Secondary | ICD-10-CM | POA: Diagnosis not present

## 2024-02-15 DIAGNOSIS — F32A Depression, unspecified: Secondary | ICD-10-CM | POA: Diagnosis not present

## 2024-02-15 DIAGNOSIS — K219 Gastro-esophageal reflux disease without esophagitis: Secondary | ICD-10-CM | POA: Diagnosis not present

## 2024-02-15 DIAGNOSIS — E785 Hyperlipidemia, unspecified: Secondary | ICD-10-CM | POA: Diagnosis not present

## 2024-02-15 DIAGNOSIS — G2581 Restless legs syndrome: Secondary | ICD-10-CM | POA: Diagnosis not present

## 2024-02-15 DIAGNOSIS — N186 End stage renal disease: Secondary | ICD-10-CM | POA: Diagnosis not present

## 2024-02-15 DIAGNOSIS — E039 Hypothyroidism, unspecified: Secondary | ICD-10-CM | POA: Diagnosis not present

## 2024-02-16 DIAGNOSIS — F32A Depression, unspecified: Secondary | ICD-10-CM | POA: Diagnosis not present

## 2024-02-16 DIAGNOSIS — G2581 Restless legs syndrome: Secondary | ICD-10-CM | POA: Diagnosis not present

## 2024-02-16 DIAGNOSIS — N186 End stage renal disease: Secondary | ICD-10-CM | POA: Diagnosis not present

## 2024-02-16 DIAGNOSIS — E039 Hypothyroidism, unspecified: Secondary | ICD-10-CM | POA: Diagnosis not present

## 2024-02-16 DIAGNOSIS — E785 Hyperlipidemia, unspecified: Secondary | ICD-10-CM | POA: Diagnosis not present

## 2024-02-16 DIAGNOSIS — K219 Gastro-esophageal reflux disease without esophagitis: Secondary | ICD-10-CM | POA: Diagnosis not present

## 2024-02-17 DIAGNOSIS — F331 Major depressive disorder, recurrent, moderate: Secondary | ICD-10-CM | POA: Diagnosis not present

## 2024-02-17 DIAGNOSIS — G4700 Insomnia, unspecified: Secondary | ICD-10-CM | POA: Diagnosis not present

## 2024-02-17 DIAGNOSIS — G3184 Mild cognitive impairment, so stated: Secondary | ICD-10-CM | POA: Diagnosis not present

## 2024-02-18 DIAGNOSIS — N186 End stage renal disease: Secondary | ICD-10-CM | POA: Diagnosis not present

## 2024-02-18 DIAGNOSIS — E039 Hypothyroidism, unspecified: Secondary | ICD-10-CM | POA: Diagnosis not present

## 2024-02-18 DIAGNOSIS — F32A Depression, unspecified: Secondary | ICD-10-CM | POA: Diagnosis not present

## 2024-02-18 DIAGNOSIS — G2581 Restless legs syndrome: Secondary | ICD-10-CM | POA: Diagnosis not present

## 2024-02-18 DIAGNOSIS — K219 Gastro-esophageal reflux disease without esophagitis: Secondary | ICD-10-CM | POA: Diagnosis not present

## 2024-02-18 DIAGNOSIS — E785 Hyperlipidemia, unspecified: Secondary | ICD-10-CM | POA: Diagnosis not present

## 2024-02-20 DIAGNOSIS — N186 End stage renal disease: Secondary | ICD-10-CM | POA: Diagnosis not present

## 2024-02-20 DIAGNOSIS — K219 Gastro-esophageal reflux disease without esophagitis: Secondary | ICD-10-CM | POA: Diagnosis not present

## 2024-02-20 DIAGNOSIS — E039 Hypothyroidism, unspecified: Secondary | ICD-10-CM | POA: Diagnosis not present

## 2024-02-20 DIAGNOSIS — F32A Depression, unspecified: Secondary | ICD-10-CM | POA: Diagnosis not present

## 2024-02-20 DIAGNOSIS — E785 Hyperlipidemia, unspecified: Secondary | ICD-10-CM | POA: Diagnosis not present

## 2024-02-20 DIAGNOSIS — G2581 Restless legs syndrome: Secondary | ICD-10-CM | POA: Diagnosis not present

## 2024-02-23 DIAGNOSIS — E039 Hypothyroidism, unspecified: Secondary | ICD-10-CM | POA: Diagnosis not present

## 2024-02-23 DIAGNOSIS — K219 Gastro-esophageal reflux disease without esophagitis: Secondary | ICD-10-CM | POA: Diagnosis not present

## 2024-02-23 DIAGNOSIS — F32A Depression, unspecified: Secondary | ICD-10-CM | POA: Diagnosis not present

## 2024-02-23 DIAGNOSIS — E785 Hyperlipidemia, unspecified: Secondary | ICD-10-CM | POA: Diagnosis not present

## 2024-02-23 DIAGNOSIS — G2581 Restless legs syndrome: Secondary | ICD-10-CM | POA: Diagnosis not present

## 2024-02-23 DIAGNOSIS — N186 End stage renal disease: Secondary | ICD-10-CM | POA: Diagnosis not present

## 2024-02-24 DIAGNOSIS — K219 Gastro-esophageal reflux disease without esophagitis: Secondary | ICD-10-CM | POA: Diagnosis not present

## 2024-02-24 DIAGNOSIS — N1832 Chronic kidney disease, stage 3b: Secondary | ICD-10-CM | POA: Diagnosis not present

## 2024-02-24 DIAGNOSIS — E0590 Thyrotoxicosis, unspecified without thyrotoxic crisis or storm: Secondary | ICD-10-CM | POA: Diagnosis not present

## 2024-02-24 DIAGNOSIS — Z8619 Personal history of other infectious and parasitic diseases: Secondary | ICD-10-CM | POA: Diagnosis not present

## 2024-02-25 DIAGNOSIS — E039 Hypothyroidism, unspecified: Secondary | ICD-10-CM | POA: Diagnosis not present

## 2024-02-25 DIAGNOSIS — G2581 Restless legs syndrome: Secondary | ICD-10-CM | POA: Diagnosis not present

## 2024-02-25 DIAGNOSIS — K219 Gastro-esophageal reflux disease without esophagitis: Secondary | ICD-10-CM | POA: Diagnosis not present

## 2024-02-25 DIAGNOSIS — F32A Depression, unspecified: Secondary | ICD-10-CM | POA: Diagnosis not present

## 2024-02-25 DIAGNOSIS — E785 Hyperlipidemia, unspecified: Secondary | ICD-10-CM | POA: Diagnosis not present

## 2024-02-25 DIAGNOSIS — N186 End stage renal disease: Secondary | ICD-10-CM | POA: Diagnosis not present

## 2024-02-26 DIAGNOSIS — E785 Hyperlipidemia, unspecified: Secondary | ICD-10-CM | POA: Diagnosis not present

## 2024-02-26 DIAGNOSIS — F32A Depression, unspecified: Secondary | ICD-10-CM | POA: Diagnosis not present

## 2024-02-26 DIAGNOSIS — G2581 Restless legs syndrome: Secondary | ICD-10-CM | POA: Diagnosis not present

## 2024-02-26 DIAGNOSIS — E039 Hypothyroidism, unspecified: Secondary | ICD-10-CM | POA: Diagnosis not present

## 2024-02-26 DIAGNOSIS — K219 Gastro-esophageal reflux disease without esophagitis: Secondary | ICD-10-CM | POA: Diagnosis not present

## 2024-02-26 DIAGNOSIS — N186 End stage renal disease: Secondary | ICD-10-CM | POA: Diagnosis not present

## 2024-02-27 DIAGNOSIS — N186 End stage renal disease: Secondary | ICD-10-CM | POA: Diagnosis not present

## 2024-02-27 DIAGNOSIS — F32A Depression, unspecified: Secondary | ICD-10-CM | POA: Diagnosis not present

## 2024-02-27 DIAGNOSIS — E785 Hyperlipidemia, unspecified: Secondary | ICD-10-CM | POA: Diagnosis not present

## 2024-02-27 DIAGNOSIS — G2581 Restless legs syndrome: Secondary | ICD-10-CM | POA: Diagnosis not present

## 2024-02-27 DIAGNOSIS — E039 Hypothyroidism, unspecified: Secondary | ICD-10-CM | POA: Diagnosis not present

## 2024-02-27 DIAGNOSIS — K219 Gastro-esophageal reflux disease without esophagitis: Secondary | ICD-10-CM | POA: Diagnosis not present

## 2024-03-01 DIAGNOSIS — N186 End stage renal disease: Secondary | ICD-10-CM | POA: Diagnosis not present

## 2024-03-01 DIAGNOSIS — E039 Hypothyroidism, unspecified: Secondary | ICD-10-CM | POA: Diagnosis not present

## 2024-03-01 DIAGNOSIS — F32A Depression, unspecified: Secondary | ICD-10-CM | POA: Diagnosis not present

## 2024-03-01 DIAGNOSIS — E785 Hyperlipidemia, unspecified: Secondary | ICD-10-CM | POA: Diagnosis not present

## 2024-03-01 DIAGNOSIS — G2581 Restless legs syndrome: Secondary | ICD-10-CM | POA: Diagnosis not present

## 2024-03-01 DIAGNOSIS — K219 Gastro-esophageal reflux disease without esophagitis: Secondary | ICD-10-CM | POA: Diagnosis not present

## 2024-03-03 DIAGNOSIS — E785 Hyperlipidemia, unspecified: Secondary | ICD-10-CM | POA: Diagnosis not present

## 2024-03-03 DIAGNOSIS — G2581 Restless legs syndrome: Secondary | ICD-10-CM | POA: Diagnosis not present

## 2024-03-03 DIAGNOSIS — F32A Depression, unspecified: Secondary | ICD-10-CM | POA: Diagnosis not present

## 2024-03-03 DIAGNOSIS — K219 Gastro-esophageal reflux disease without esophagitis: Secondary | ICD-10-CM | POA: Diagnosis not present

## 2024-03-03 DIAGNOSIS — N186 End stage renal disease: Secondary | ICD-10-CM | POA: Diagnosis not present

## 2024-03-03 DIAGNOSIS — E039 Hypothyroidism, unspecified: Secondary | ICD-10-CM | POA: Diagnosis not present

## 2024-03-05 DIAGNOSIS — N186 End stage renal disease: Secondary | ICD-10-CM | POA: Diagnosis not present

## 2024-03-05 DIAGNOSIS — E785 Hyperlipidemia, unspecified: Secondary | ICD-10-CM | POA: Diagnosis not present

## 2024-03-05 DIAGNOSIS — K219 Gastro-esophageal reflux disease without esophagitis: Secondary | ICD-10-CM | POA: Diagnosis not present

## 2024-03-05 DIAGNOSIS — G2581 Restless legs syndrome: Secondary | ICD-10-CM | POA: Diagnosis not present

## 2024-03-05 DIAGNOSIS — E039 Hypothyroidism, unspecified: Secondary | ICD-10-CM | POA: Diagnosis not present

## 2024-03-05 DIAGNOSIS — F32A Depression, unspecified: Secondary | ICD-10-CM | POA: Diagnosis not present

## 2024-03-08 DIAGNOSIS — E039 Hypothyroidism, unspecified: Secondary | ICD-10-CM | POA: Diagnosis not present

## 2024-03-08 DIAGNOSIS — G2581 Restless legs syndrome: Secondary | ICD-10-CM | POA: Diagnosis not present

## 2024-03-08 DIAGNOSIS — K219 Gastro-esophageal reflux disease without esophagitis: Secondary | ICD-10-CM | POA: Diagnosis not present

## 2024-03-08 DIAGNOSIS — F32A Depression, unspecified: Secondary | ICD-10-CM | POA: Diagnosis not present

## 2024-03-08 DIAGNOSIS — N186 End stage renal disease: Secondary | ICD-10-CM | POA: Diagnosis not present

## 2024-03-08 DIAGNOSIS — E785 Hyperlipidemia, unspecified: Secondary | ICD-10-CM | POA: Diagnosis not present

## 2024-03-10 DIAGNOSIS — K219 Gastro-esophageal reflux disease without esophagitis: Secondary | ICD-10-CM | POA: Diagnosis not present

## 2024-03-10 DIAGNOSIS — E039 Hypothyroidism, unspecified: Secondary | ICD-10-CM | POA: Diagnosis not present

## 2024-03-10 DIAGNOSIS — N186 End stage renal disease: Secondary | ICD-10-CM | POA: Diagnosis not present

## 2024-03-10 DIAGNOSIS — E785 Hyperlipidemia, unspecified: Secondary | ICD-10-CM | POA: Diagnosis not present

## 2024-03-10 DIAGNOSIS — G2581 Restless legs syndrome: Secondary | ICD-10-CM | POA: Diagnosis not present

## 2024-03-10 DIAGNOSIS — F32A Depression, unspecified: Secondary | ICD-10-CM | POA: Diagnosis not present

## 2024-03-12 DIAGNOSIS — G2581 Restless legs syndrome: Secondary | ICD-10-CM | POA: Diagnosis not present

## 2024-03-12 DIAGNOSIS — E039 Hypothyroidism, unspecified: Secondary | ICD-10-CM | POA: Diagnosis not present

## 2024-03-12 DIAGNOSIS — K219 Gastro-esophageal reflux disease without esophagitis: Secondary | ICD-10-CM | POA: Diagnosis not present

## 2024-03-12 DIAGNOSIS — N186 End stage renal disease: Secondary | ICD-10-CM | POA: Diagnosis not present

## 2024-03-12 DIAGNOSIS — F32A Depression, unspecified: Secondary | ICD-10-CM | POA: Diagnosis not present

## 2024-03-12 DIAGNOSIS — E785 Hyperlipidemia, unspecified: Secondary | ICD-10-CM | POA: Diagnosis not present

## 2024-03-15 DIAGNOSIS — G2581 Restless legs syndrome: Secondary | ICD-10-CM | POA: Diagnosis not present

## 2024-03-15 DIAGNOSIS — N186 End stage renal disease: Secondary | ICD-10-CM | POA: Diagnosis not present

## 2024-03-15 DIAGNOSIS — E039 Hypothyroidism, unspecified: Secondary | ICD-10-CM | POA: Diagnosis not present

## 2024-03-15 DIAGNOSIS — E785 Hyperlipidemia, unspecified: Secondary | ICD-10-CM | POA: Diagnosis not present

## 2024-03-15 DIAGNOSIS — K219 Gastro-esophageal reflux disease without esophagitis: Secondary | ICD-10-CM | POA: Diagnosis not present

## 2024-03-15 DIAGNOSIS — F32A Depression, unspecified: Secondary | ICD-10-CM | POA: Diagnosis not present

## 2024-03-16 DIAGNOSIS — K219 Gastro-esophageal reflux disease without esophagitis: Secondary | ICD-10-CM | POA: Diagnosis not present

## 2024-03-16 DIAGNOSIS — G2581 Restless legs syndrome: Secondary | ICD-10-CM | POA: Diagnosis not present

## 2024-03-16 DIAGNOSIS — E039 Hypothyroidism, unspecified: Secondary | ICD-10-CM | POA: Diagnosis not present

## 2024-03-16 DIAGNOSIS — N186 End stage renal disease: Secondary | ICD-10-CM | POA: Diagnosis not present

## 2024-03-16 DIAGNOSIS — E785 Hyperlipidemia, unspecified: Secondary | ICD-10-CM | POA: Diagnosis not present

## 2024-03-16 DIAGNOSIS — F32A Depression, unspecified: Secondary | ICD-10-CM | POA: Diagnosis not present

## 2024-03-17 DIAGNOSIS — E039 Hypothyroidism, unspecified: Secondary | ICD-10-CM | POA: Diagnosis not present

## 2024-03-17 DIAGNOSIS — E785 Hyperlipidemia, unspecified: Secondary | ICD-10-CM | POA: Diagnosis not present

## 2024-03-17 DIAGNOSIS — G2581 Restless legs syndrome: Secondary | ICD-10-CM | POA: Diagnosis not present

## 2024-03-17 DIAGNOSIS — F32A Depression, unspecified: Secondary | ICD-10-CM | POA: Diagnosis not present

## 2024-03-17 DIAGNOSIS — N186 End stage renal disease: Secondary | ICD-10-CM | POA: Diagnosis not present

## 2024-03-17 DIAGNOSIS — K219 Gastro-esophageal reflux disease without esophagitis: Secondary | ICD-10-CM | POA: Diagnosis not present

## 2024-03-19 DIAGNOSIS — N186 End stage renal disease: Secondary | ICD-10-CM | POA: Diagnosis not present

## 2024-03-19 DIAGNOSIS — E039 Hypothyroidism, unspecified: Secondary | ICD-10-CM | POA: Diagnosis not present

## 2024-03-19 DIAGNOSIS — E785 Hyperlipidemia, unspecified: Secondary | ICD-10-CM | POA: Diagnosis not present

## 2024-03-19 DIAGNOSIS — K219 Gastro-esophageal reflux disease without esophagitis: Secondary | ICD-10-CM | POA: Diagnosis not present

## 2024-03-19 DIAGNOSIS — G2581 Restless legs syndrome: Secondary | ICD-10-CM | POA: Diagnosis not present

## 2024-03-19 DIAGNOSIS — F32A Depression, unspecified: Secondary | ICD-10-CM | POA: Diagnosis not present

## 2024-03-22 DIAGNOSIS — N186 End stage renal disease: Secondary | ICD-10-CM | POA: Diagnosis not present

## 2024-03-22 DIAGNOSIS — K219 Gastro-esophageal reflux disease without esophagitis: Secondary | ICD-10-CM | POA: Diagnosis not present

## 2024-03-22 DIAGNOSIS — G2581 Restless legs syndrome: Secondary | ICD-10-CM | POA: Diagnosis not present

## 2024-03-22 DIAGNOSIS — E039 Hypothyroidism, unspecified: Secondary | ICD-10-CM | POA: Diagnosis not present

## 2024-03-22 DIAGNOSIS — E785 Hyperlipidemia, unspecified: Secondary | ICD-10-CM | POA: Diagnosis not present

## 2024-03-22 DIAGNOSIS — F32A Depression, unspecified: Secondary | ICD-10-CM | POA: Diagnosis not present

## 2024-03-23 DIAGNOSIS — Z515 Encounter for palliative care: Secondary | ICD-10-CM | POA: Diagnosis not present

## 2024-03-23 DIAGNOSIS — K219 Gastro-esophageal reflux disease without esophagitis: Secondary | ICD-10-CM | POA: Diagnosis not present

## 2024-03-23 DIAGNOSIS — E039 Hypothyroidism, unspecified: Secondary | ICD-10-CM | POA: Diagnosis not present

## 2024-03-23 DIAGNOSIS — N1832 Chronic kidney disease, stage 3b: Secondary | ICD-10-CM | POA: Diagnosis not present

## 2024-03-24 DIAGNOSIS — E039 Hypothyroidism, unspecified: Secondary | ICD-10-CM | POA: Diagnosis not present

## 2024-03-24 DIAGNOSIS — G2581 Restless legs syndrome: Secondary | ICD-10-CM | POA: Diagnosis not present

## 2024-03-24 DIAGNOSIS — F32A Depression, unspecified: Secondary | ICD-10-CM | POA: Diagnosis not present

## 2024-03-24 DIAGNOSIS — N186 End stage renal disease: Secondary | ICD-10-CM | POA: Diagnosis not present

## 2024-03-24 DIAGNOSIS — K219 Gastro-esophageal reflux disease without esophagitis: Secondary | ICD-10-CM | POA: Diagnosis not present

## 2024-03-24 DIAGNOSIS — E785 Hyperlipidemia, unspecified: Secondary | ICD-10-CM | POA: Diagnosis not present

## 2024-03-27 DIAGNOSIS — K219 Gastro-esophageal reflux disease without esophagitis: Secondary | ICD-10-CM | POA: Diagnosis not present

## 2024-03-27 DIAGNOSIS — E785 Hyperlipidemia, unspecified: Secondary | ICD-10-CM | POA: Diagnosis not present

## 2024-03-27 DIAGNOSIS — E039 Hypothyroidism, unspecified: Secondary | ICD-10-CM | POA: Diagnosis not present

## 2024-03-27 DIAGNOSIS — G2581 Restless legs syndrome: Secondary | ICD-10-CM | POA: Diagnosis not present

## 2024-03-27 DIAGNOSIS — N186 End stage renal disease: Secondary | ICD-10-CM | POA: Diagnosis not present

## 2024-03-27 DIAGNOSIS — F32A Depression, unspecified: Secondary | ICD-10-CM | POA: Diagnosis not present

## 2024-03-29 DIAGNOSIS — N186 End stage renal disease: Secondary | ICD-10-CM | POA: Diagnosis not present

## 2024-03-29 DIAGNOSIS — F32A Depression, unspecified: Secondary | ICD-10-CM | POA: Diagnosis not present

## 2024-03-29 DIAGNOSIS — K219 Gastro-esophageal reflux disease without esophagitis: Secondary | ICD-10-CM | POA: Diagnosis not present

## 2024-03-29 DIAGNOSIS — E039 Hypothyroidism, unspecified: Secondary | ICD-10-CM | POA: Diagnosis not present

## 2024-03-29 DIAGNOSIS — E785 Hyperlipidemia, unspecified: Secondary | ICD-10-CM | POA: Diagnosis not present

## 2024-03-29 DIAGNOSIS — G2581 Restless legs syndrome: Secondary | ICD-10-CM | POA: Diagnosis not present

## 2024-03-31 DIAGNOSIS — E785 Hyperlipidemia, unspecified: Secondary | ICD-10-CM | POA: Diagnosis not present

## 2024-03-31 DIAGNOSIS — K219 Gastro-esophageal reflux disease without esophagitis: Secondary | ICD-10-CM | POA: Diagnosis not present

## 2024-03-31 DIAGNOSIS — F32A Depression, unspecified: Secondary | ICD-10-CM | POA: Diagnosis not present

## 2024-03-31 DIAGNOSIS — G2581 Restless legs syndrome: Secondary | ICD-10-CM | POA: Diagnosis not present

## 2024-03-31 DIAGNOSIS — E039 Hypothyroidism, unspecified: Secondary | ICD-10-CM | POA: Diagnosis not present

## 2024-03-31 DIAGNOSIS — N186 End stage renal disease: Secondary | ICD-10-CM | POA: Diagnosis not present

## 2024-04-01 DIAGNOSIS — G3184 Mild cognitive impairment, so stated: Secondary | ICD-10-CM | POA: Diagnosis not present

## 2024-04-01 DIAGNOSIS — G4700 Insomnia, unspecified: Secondary | ICD-10-CM | POA: Diagnosis not present

## 2024-04-01 DIAGNOSIS — F331 Major depressive disorder, recurrent, moderate: Secondary | ICD-10-CM | POA: Diagnosis not present

## 2024-04-02 DIAGNOSIS — F32A Depression, unspecified: Secondary | ICD-10-CM | POA: Diagnosis not present

## 2024-04-02 DIAGNOSIS — N186 End stage renal disease: Secondary | ICD-10-CM | POA: Diagnosis not present

## 2024-04-02 DIAGNOSIS — E785 Hyperlipidemia, unspecified: Secondary | ICD-10-CM | POA: Diagnosis not present

## 2024-04-02 DIAGNOSIS — E039 Hypothyroidism, unspecified: Secondary | ICD-10-CM | POA: Diagnosis not present

## 2024-04-02 DIAGNOSIS — G2581 Restless legs syndrome: Secondary | ICD-10-CM | POA: Diagnosis not present

## 2024-04-02 DIAGNOSIS — K219 Gastro-esophageal reflux disease without esophagitis: Secondary | ICD-10-CM | POA: Diagnosis not present

## 2024-04-05 DIAGNOSIS — F32A Depression, unspecified: Secondary | ICD-10-CM | POA: Diagnosis not present

## 2024-04-05 DIAGNOSIS — E039 Hypothyroidism, unspecified: Secondary | ICD-10-CM | POA: Diagnosis not present

## 2024-04-05 DIAGNOSIS — G2581 Restless legs syndrome: Secondary | ICD-10-CM | POA: Diagnosis not present

## 2024-04-05 DIAGNOSIS — K219 Gastro-esophageal reflux disease without esophagitis: Secondary | ICD-10-CM | POA: Diagnosis not present

## 2024-04-05 DIAGNOSIS — N186 End stage renal disease: Secondary | ICD-10-CM | POA: Diagnosis not present

## 2024-04-05 DIAGNOSIS — E785 Hyperlipidemia, unspecified: Secondary | ICD-10-CM | POA: Diagnosis not present

## 2024-04-06 DIAGNOSIS — F321 Major depressive disorder, single episode, moderate: Secondary | ICD-10-CM | POA: Diagnosis not present

## 2024-04-06 DIAGNOSIS — N1832 Chronic kidney disease, stage 3b: Secondary | ICD-10-CM | POA: Diagnosis not present

## 2024-04-06 DIAGNOSIS — E46 Unspecified protein-calorie malnutrition: Secondary | ICD-10-CM | POA: Diagnosis not present

## 2024-04-06 DIAGNOSIS — F419 Anxiety disorder, unspecified: Secondary | ICD-10-CM | POA: Diagnosis not present

## 2024-04-06 DIAGNOSIS — E039 Hypothyroidism, unspecified: Secondary | ICD-10-CM | POA: Diagnosis not present

## 2024-04-06 DIAGNOSIS — Z515 Encounter for palliative care: Secondary | ICD-10-CM | POA: Diagnosis not present

## 2024-04-06 DIAGNOSIS — A0472 Enterocolitis due to Clostridium difficile, not specified as recurrent: Secondary | ICD-10-CM | POA: Diagnosis not present

## 2024-04-06 DIAGNOSIS — K219 Gastro-esophageal reflux disease without esophagitis: Secondary | ICD-10-CM | POA: Diagnosis not present

## 2024-04-07 DIAGNOSIS — K219 Gastro-esophageal reflux disease without esophagitis: Secondary | ICD-10-CM | POA: Diagnosis not present

## 2024-04-07 DIAGNOSIS — N186 End stage renal disease: Secondary | ICD-10-CM | POA: Diagnosis not present

## 2024-04-07 DIAGNOSIS — G2581 Restless legs syndrome: Secondary | ICD-10-CM | POA: Diagnosis not present

## 2024-04-07 DIAGNOSIS — F32A Depression, unspecified: Secondary | ICD-10-CM | POA: Diagnosis not present

## 2024-04-07 DIAGNOSIS — E785 Hyperlipidemia, unspecified: Secondary | ICD-10-CM | POA: Diagnosis not present

## 2024-04-07 DIAGNOSIS — E039 Hypothyroidism, unspecified: Secondary | ICD-10-CM | POA: Diagnosis not present

## 2024-04-09 DIAGNOSIS — E039 Hypothyroidism, unspecified: Secondary | ICD-10-CM | POA: Diagnosis not present

## 2024-04-09 DIAGNOSIS — E785 Hyperlipidemia, unspecified: Secondary | ICD-10-CM | POA: Diagnosis not present

## 2024-04-09 DIAGNOSIS — K219 Gastro-esophageal reflux disease without esophagitis: Secondary | ICD-10-CM | POA: Diagnosis not present

## 2024-04-09 DIAGNOSIS — G2581 Restless legs syndrome: Secondary | ICD-10-CM | POA: Diagnosis not present

## 2024-04-09 DIAGNOSIS — F32A Depression, unspecified: Secondary | ICD-10-CM | POA: Diagnosis not present

## 2024-04-09 DIAGNOSIS — N186 End stage renal disease: Secondary | ICD-10-CM | POA: Diagnosis not present

## 2024-04-12 DIAGNOSIS — N186 End stage renal disease: Secondary | ICD-10-CM | POA: Diagnosis not present

## 2024-04-12 DIAGNOSIS — E785 Hyperlipidemia, unspecified: Secondary | ICD-10-CM | POA: Diagnosis not present

## 2024-04-12 DIAGNOSIS — E039 Hypothyroidism, unspecified: Secondary | ICD-10-CM | POA: Diagnosis not present

## 2024-04-12 DIAGNOSIS — F32A Depression, unspecified: Secondary | ICD-10-CM | POA: Diagnosis not present

## 2024-04-12 DIAGNOSIS — G2581 Restless legs syndrome: Secondary | ICD-10-CM | POA: Diagnosis not present

## 2024-04-12 DIAGNOSIS — K219 Gastro-esophageal reflux disease without esophagitis: Secondary | ICD-10-CM | POA: Diagnosis not present

## 2024-04-14 DIAGNOSIS — F32A Depression, unspecified: Secondary | ICD-10-CM | POA: Diagnosis not present

## 2024-04-14 DIAGNOSIS — E785 Hyperlipidemia, unspecified: Secondary | ICD-10-CM | POA: Diagnosis not present

## 2024-04-14 DIAGNOSIS — K219 Gastro-esophageal reflux disease without esophagitis: Secondary | ICD-10-CM | POA: Diagnosis not present

## 2024-04-14 DIAGNOSIS — G2581 Restless legs syndrome: Secondary | ICD-10-CM | POA: Diagnosis not present

## 2024-04-14 DIAGNOSIS — N186 End stage renal disease: Secondary | ICD-10-CM | POA: Diagnosis not present

## 2024-04-14 DIAGNOSIS — E039 Hypothyroidism, unspecified: Secondary | ICD-10-CM | POA: Diagnosis not present

## 2024-04-16 DIAGNOSIS — F32A Depression, unspecified: Secondary | ICD-10-CM | POA: Diagnosis not present

## 2024-04-16 DIAGNOSIS — K219 Gastro-esophageal reflux disease without esophagitis: Secondary | ICD-10-CM | POA: Diagnosis not present

## 2024-04-16 DIAGNOSIS — N186 End stage renal disease: Secondary | ICD-10-CM | POA: Diagnosis not present

## 2024-04-16 DIAGNOSIS — E039 Hypothyroidism, unspecified: Secondary | ICD-10-CM | POA: Diagnosis not present

## 2024-04-16 DIAGNOSIS — G2581 Restless legs syndrome: Secondary | ICD-10-CM | POA: Diagnosis not present

## 2024-04-16 DIAGNOSIS — E785 Hyperlipidemia, unspecified: Secondary | ICD-10-CM | POA: Diagnosis not present

## 2024-04-19 DIAGNOSIS — F32A Depression, unspecified: Secondary | ICD-10-CM | POA: Diagnosis not present

## 2024-04-19 DIAGNOSIS — K219 Gastro-esophageal reflux disease without esophagitis: Secondary | ICD-10-CM | POA: Diagnosis not present

## 2024-04-19 DIAGNOSIS — N186 End stage renal disease: Secondary | ICD-10-CM | POA: Diagnosis not present

## 2024-04-19 DIAGNOSIS — G2581 Restless legs syndrome: Secondary | ICD-10-CM | POA: Diagnosis not present

## 2024-04-19 DIAGNOSIS — E785 Hyperlipidemia, unspecified: Secondary | ICD-10-CM | POA: Diagnosis not present

## 2024-04-19 DIAGNOSIS — E039 Hypothyroidism, unspecified: Secondary | ICD-10-CM | POA: Diagnosis not present

## 2024-04-21 DIAGNOSIS — N186 End stage renal disease: Secondary | ICD-10-CM | POA: Diagnosis not present

## 2024-04-21 DIAGNOSIS — E039 Hypothyroidism, unspecified: Secondary | ICD-10-CM | POA: Diagnosis not present

## 2024-04-21 DIAGNOSIS — F32A Depression, unspecified: Secondary | ICD-10-CM | POA: Diagnosis not present

## 2024-04-21 DIAGNOSIS — E785 Hyperlipidemia, unspecified: Secondary | ICD-10-CM | POA: Diagnosis not present

## 2024-04-21 DIAGNOSIS — E7800 Pure hypercholesterolemia, unspecified: Secondary | ICD-10-CM | POA: Diagnosis not present

## 2024-04-21 DIAGNOSIS — K219 Gastro-esophageal reflux disease without esophagitis: Secondary | ICD-10-CM | POA: Diagnosis not present

## 2024-04-21 DIAGNOSIS — G2581 Restless legs syndrome: Secondary | ICD-10-CM | POA: Diagnosis not present

## 2024-04-23 DIAGNOSIS — F32A Depression, unspecified: Secondary | ICD-10-CM | POA: Diagnosis not present

## 2024-04-23 DIAGNOSIS — N186 End stage renal disease: Secondary | ICD-10-CM | POA: Diagnosis not present

## 2024-04-23 DIAGNOSIS — E785 Hyperlipidemia, unspecified: Secondary | ICD-10-CM | POA: Diagnosis not present

## 2024-04-23 DIAGNOSIS — G2581 Restless legs syndrome: Secondary | ICD-10-CM | POA: Diagnosis not present

## 2024-04-23 DIAGNOSIS — E039 Hypothyroidism, unspecified: Secondary | ICD-10-CM | POA: Diagnosis not present

## 2024-04-23 DIAGNOSIS — K219 Gastro-esophageal reflux disease without esophagitis: Secondary | ICD-10-CM | POA: Diagnosis not present

## 2024-04-27 DIAGNOSIS — E785 Hyperlipidemia, unspecified: Secondary | ICD-10-CM | POA: Diagnosis not present

## 2024-04-27 DIAGNOSIS — G2581 Restless legs syndrome: Secondary | ICD-10-CM | POA: Diagnosis not present

## 2024-04-27 DIAGNOSIS — R000 Tachycardia, unspecified: Secondary | ICD-10-CM | POA: Diagnosis not present

## 2024-04-27 DIAGNOSIS — K219 Gastro-esophageal reflux disease without esophagitis: Secondary | ICD-10-CM | POA: Diagnosis not present

## 2024-04-27 DIAGNOSIS — G3184 Mild cognitive impairment, so stated: Secondary | ICD-10-CM | POA: Diagnosis not present

## 2024-04-27 DIAGNOSIS — F331 Major depressive disorder, recurrent, moderate: Secondary | ICD-10-CM | POA: Diagnosis not present

## 2024-04-27 DIAGNOSIS — N186 End stage renal disease: Secondary | ICD-10-CM | POA: Diagnosis not present

## 2024-04-27 DIAGNOSIS — F32A Depression, unspecified: Secondary | ICD-10-CM | POA: Diagnosis not present

## 2024-04-27 DIAGNOSIS — G4700 Insomnia, unspecified: Secondary | ICD-10-CM | POA: Diagnosis not present

## 2024-04-27 DIAGNOSIS — R051 Acute cough: Secondary | ICD-10-CM | POA: Diagnosis not present

## 2024-04-27 DIAGNOSIS — E039 Hypothyroidism, unspecified: Secondary | ICD-10-CM | POA: Diagnosis not present

## 2024-04-28 DIAGNOSIS — K219 Gastro-esophageal reflux disease without esophagitis: Secondary | ICD-10-CM | POA: Diagnosis not present

## 2024-04-28 DIAGNOSIS — E785 Hyperlipidemia, unspecified: Secondary | ICD-10-CM | POA: Diagnosis not present

## 2024-04-28 DIAGNOSIS — N186 End stage renal disease: Secondary | ICD-10-CM | POA: Diagnosis not present

## 2024-04-28 DIAGNOSIS — G2581 Restless legs syndrome: Secondary | ICD-10-CM | POA: Diagnosis not present

## 2024-04-28 DIAGNOSIS — F32A Depression, unspecified: Secondary | ICD-10-CM | POA: Diagnosis not present

## 2024-04-28 DIAGNOSIS — E039 Hypothyroidism, unspecified: Secondary | ICD-10-CM | POA: Diagnosis not present

## 2024-04-30 DIAGNOSIS — N186 End stage renal disease: Secondary | ICD-10-CM | POA: Diagnosis not present

## 2024-04-30 DIAGNOSIS — G2581 Restless legs syndrome: Secondary | ICD-10-CM | POA: Diagnosis not present

## 2024-04-30 DIAGNOSIS — G3184 Mild cognitive impairment, so stated: Secondary | ICD-10-CM | POA: Diagnosis not present

## 2024-04-30 DIAGNOSIS — F331 Major depressive disorder, recurrent, moderate: Secondary | ICD-10-CM | POA: Diagnosis not present

## 2024-04-30 DIAGNOSIS — F32A Depression, unspecified: Secondary | ICD-10-CM | POA: Diagnosis not present

## 2024-04-30 DIAGNOSIS — E039 Hypothyroidism, unspecified: Secondary | ICD-10-CM | POA: Diagnosis not present

## 2024-04-30 DIAGNOSIS — E785 Hyperlipidemia, unspecified: Secondary | ICD-10-CM | POA: Diagnosis not present

## 2024-04-30 DIAGNOSIS — K219 Gastro-esophageal reflux disease without esophagitis: Secondary | ICD-10-CM | POA: Diagnosis not present

## 2024-04-30 DIAGNOSIS — G4700 Insomnia, unspecified: Secondary | ICD-10-CM | POA: Diagnosis not present

## 2024-05-03 DIAGNOSIS — N186 End stage renal disease: Secondary | ICD-10-CM | POA: Diagnosis not present

## 2024-05-03 DIAGNOSIS — F32A Depression, unspecified: Secondary | ICD-10-CM | POA: Diagnosis not present

## 2024-05-03 DIAGNOSIS — E039 Hypothyroidism, unspecified: Secondary | ICD-10-CM | POA: Diagnosis not present

## 2024-05-03 DIAGNOSIS — G2581 Restless legs syndrome: Secondary | ICD-10-CM | POA: Diagnosis not present

## 2024-05-03 DIAGNOSIS — K219 Gastro-esophageal reflux disease without esophagitis: Secondary | ICD-10-CM | POA: Diagnosis not present

## 2024-05-03 DIAGNOSIS — E785 Hyperlipidemia, unspecified: Secondary | ICD-10-CM | POA: Diagnosis not present

## 2024-05-04 DIAGNOSIS — R41841 Cognitive communication deficit: Secondary | ICD-10-CM | POA: Diagnosis not present

## 2024-05-04 DIAGNOSIS — R1312 Dysphagia, oropharyngeal phase: Secondary | ICD-10-CM | POA: Diagnosis not present

## 2024-05-05 DIAGNOSIS — R41841 Cognitive communication deficit: Secondary | ICD-10-CM | POA: Diagnosis not present

## 2024-05-05 DIAGNOSIS — K219 Gastro-esophageal reflux disease without esophagitis: Secondary | ICD-10-CM | POA: Diagnosis not present

## 2024-05-05 DIAGNOSIS — R1312 Dysphagia, oropharyngeal phase: Secondary | ICD-10-CM | POA: Diagnosis not present

## 2024-05-05 DIAGNOSIS — E039 Hypothyroidism, unspecified: Secondary | ICD-10-CM | POA: Diagnosis not present

## 2024-05-05 DIAGNOSIS — G2581 Restless legs syndrome: Secondary | ICD-10-CM | POA: Diagnosis not present

## 2024-05-05 DIAGNOSIS — E785 Hyperlipidemia, unspecified: Secondary | ICD-10-CM | POA: Diagnosis not present

## 2024-05-05 DIAGNOSIS — N186 End stage renal disease: Secondary | ICD-10-CM | POA: Diagnosis not present

## 2024-05-05 DIAGNOSIS — F32A Depression, unspecified: Secondary | ICD-10-CM | POA: Diagnosis not present

## 2024-05-06 DIAGNOSIS — R1312 Dysphagia, oropharyngeal phase: Secondary | ICD-10-CM | POA: Diagnosis not present

## 2024-05-06 DIAGNOSIS — R41841 Cognitive communication deficit: Secondary | ICD-10-CM | POA: Diagnosis not present

## 2024-05-07 DIAGNOSIS — K219 Gastro-esophageal reflux disease without esophagitis: Secondary | ICD-10-CM | POA: Diagnosis not present

## 2024-05-07 DIAGNOSIS — F32A Depression, unspecified: Secondary | ICD-10-CM | POA: Diagnosis not present

## 2024-05-07 DIAGNOSIS — N186 End stage renal disease: Secondary | ICD-10-CM | POA: Diagnosis not present

## 2024-05-07 DIAGNOSIS — E039 Hypothyroidism, unspecified: Secondary | ICD-10-CM | POA: Diagnosis not present

## 2024-05-07 DIAGNOSIS — G2581 Restless legs syndrome: Secondary | ICD-10-CM | POA: Diagnosis not present

## 2024-05-07 DIAGNOSIS — E785 Hyperlipidemia, unspecified: Secondary | ICD-10-CM | POA: Diagnosis not present

## 2024-05-07 DIAGNOSIS — R1312 Dysphagia, oropharyngeal phase: Secondary | ICD-10-CM | POA: Diagnosis not present

## 2024-05-07 DIAGNOSIS — R41841 Cognitive communication deficit: Secondary | ICD-10-CM | POA: Diagnosis not present

## 2024-05-10 DIAGNOSIS — R41841 Cognitive communication deficit: Secondary | ICD-10-CM | POA: Diagnosis not present

## 2024-05-10 DIAGNOSIS — R1312 Dysphagia, oropharyngeal phase: Secondary | ICD-10-CM | POA: Diagnosis not present

## 2024-05-11 DIAGNOSIS — R1312 Dysphagia, oropharyngeal phase: Secondary | ICD-10-CM | POA: Diagnosis not present

## 2024-05-11 DIAGNOSIS — R41841 Cognitive communication deficit: Secondary | ICD-10-CM | POA: Diagnosis not present

## 2024-05-12 DIAGNOSIS — F32A Depression, unspecified: Secondary | ICD-10-CM | POA: Diagnosis not present

## 2024-05-12 DIAGNOSIS — N186 End stage renal disease: Secondary | ICD-10-CM | POA: Diagnosis not present

## 2024-05-12 DIAGNOSIS — K219 Gastro-esophageal reflux disease without esophagitis: Secondary | ICD-10-CM | POA: Diagnosis not present

## 2024-05-12 DIAGNOSIS — G2581 Restless legs syndrome: Secondary | ICD-10-CM | POA: Diagnosis not present

## 2024-05-12 DIAGNOSIS — E785 Hyperlipidemia, unspecified: Secondary | ICD-10-CM | POA: Diagnosis not present

## 2024-05-12 DIAGNOSIS — R41841 Cognitive communication deficit: Secondary | ICD-10-CM | POA: Diagnosis not present

## 2024-05-12 DIAGNOSIS — R1312 Dysphagia, oropharyngeal phase: Secondary | ICD-10-CM | POA: Diagnosis not present

## 2024-05-12 DIAGNOSIS — E039 Hypothyroidism, unspecified: Secondary | ICD-10-CM | POA: Diagnosis not present

## 2024-05-13 DIAGNOSIS — N186 End stage renal disease: Secondary | ICD-10-CM | POA: Diagnosis not present

## 2024-05-13 DIAGNOSIS — G2581 Restless legs syndrome: Secondary | ICD-10-CM | POA: Diagnosis not present

## 2024-05-13 DIAGNOSIS — E785 Hyperlipidemia, unspecified: Secondary | ICD-10-CM | POA: Diagnosis not present

## 2024-05-13 DIAGNOSIS — R1312 Dysphagia, oropharyngeal phase: Secondary | ICD-10-CM | POA: Diagnosis not present

## 2024-05-13 DIAGNOSIS — K219 Gastro-esophageal reflux disease without esophagitis: Secondary | ICD-10-CM | POA: Diagnosis not present

## 2024-05-13 DIAGNOSIS — E039 Hypothyroidism, unspecified: Secondary | ICD-10-CM | POA: Diagnosis not present

## 2024-05-13 DIAGNOSIS — R41841 Cognitive communication deficit: Secondary | ICD-10-CM | POA: Diagnosis not present

## 2024-05-13 DIAGNOSIS — F32A Depression, unspecified: Secondary | ICD-10-CM | POA: Diagnosis not present

## 2024-05-14 DIAGNOSIS — E785 Hyperlipidemia, unspecified: Secondary | ICD-10-CM | POA: Diagnosis not present

## 2024-05-14 DIAGNOSIS — G2581 Restless legs syndrome: Secondary | ICD-10-CM | POA: Diagnosis not present

## 2024-05-14 DIAGNOSIS — N186 End stage renal disease: Secondary | ICD-10-CM | POA: Diagnosis not present

## 2024-05-14 DIAGNOSIS — E039 Hypothyroidism, unspecified: Secondary | ICD-10-CM | POA: Diagnosis not present

## 2024-05-14 DIAGNOSIS — K219 Gastro-esophageal reflux disease without esophagitis: Secondary | ICD-10-CM | POA: Diagnosis not present

## 2024-05-14 DIAGNOSIS — R1312 Dysphagia, oropharyngeal phase: Secondary | ICD-10-CM | POA: Diagnosis not present

## 2024-05-14 DIAGNOSIS — R41841 Cognitive communication deficit: Secondary | ICD-10-CM | POA: Diagnosis not present

## 2024-05-14 DIAGNOSIS — F32A Depression, unspecified: Secondary | ICD-10-CM | POA: Diagnosis not present

## 2024-05-17 DIAGNOSIS — R41841 Cognitive communication deficit: Secondary | ICD-10-CM | POA: Diagnosis not present

## 2024-05-17 DIAGNOSIS — E039 Hypothyroidism, unspecified: Secondary | ICD-10-CM | POA: Diagnosis not present

## 2024-05-17 DIAGNOSIS — G2581 Restless legs syndrome: Secondary | ICD-10-CM | POA: Diagnosis not present

## 2024-05-17 DIAGNOSIS — N186 End stage renal disease: Secondary | ICD-10-CM | POA: Diagnosis not present

## 2024-05-17 DIAGNOSIS — K219 Gastro-esophageal reflux disease without esophagitis: Secondary | ICD-10-CM | POA: Diagnosis not present

## 2024-05-17 DIAGNOSIS — E785 Hyperlipidemia, unspecified: Secondary | ICD-10-CM | POA: Diagnosis not present

## 2024-05-17 DIAGNOSIS — F32A Depression, unspecified: Secondary | ICD-10-CM | POA: Diagnosis not present

## 2024-05-17 DIAGNOSIS — R1312 Dysphagia, oropharyngeal phase: Secondary | ICD-10-CM | POA: Diagnosis not present

## 2024-05-18 DIAGNOSIS — R1312 Dysphagia, oropharyngeal phase: Secondary | ICD-10-CM | POA: Diagnosis not present

## 2024-05-18 DIAGNOSIS — R41841 Cognitive communication deficit: Secondary | ICD-10-CM | POA: Diagnosis not present

## 2024-05-19 DIAGNOSIS — R41841 Cognitive communication deficit: Secondary | ICD-10-CM | POA: Diagnosis not present

## 2024-05-19 DIAGNOSIS — E785 Hyperlipidemia, unspecified: Secondary | ICD-10-CM | POA: Diagnosis not present

## 2024-05-19 DIAGNOSIS — N186 End stage renal disease: Secondary | ICD-10-CM | POA: Diagnosis not present

## 2024-05-19 DIAGNOSIS — R1312 Dysphagia, oropharyngeal phase: Secondary | ICD-10-CM | POA: Diagnosis not present

## 2024-05-19 DIAGNOSIS — G2581 Restless legs syndrome: Secondary | ICD-10-CM | POA: Diagnosis not present

## 2024-05-19 DIAGNOSIS — F32A Depression, unspecified: Secondary | ICD-10-CM | POA: Diagnosis not present

## 2024-05-19 DIAGNOSIS — E039 Hypothyroidism, unspecified: Secondary | ICD-10-CM | POA: Diagnosis not present

## 2024-05-19 DIAGNOSIS — K219 Gastro-esophageal reflux disease without esophagitis: Secondary | ICD-10-CM | POA: Diagnosis not present

## 2024-05-20 DIAGNOSIS — F32A Depression, unspecified: Secondary | ICD-10-CM | POA: Diagnosis not present

## 2024-05-20 DIAGNOSIS — K219 Gastro-esophageal reflux disease without esophagitis: Secondary | ICD-10-CM | POA: Diagnosis not present

## 2024-05-20 DIAGNOSIS — G2581 Restless legs syndrome: Secondary | ICD-10-CM | POA: Diagnosis not present

## 2024-05-20 DIAGNOSIS — E039 Hypothyroidism, unspecified: Secondary | ICD-10-CM | POA: Diagnosis not present

## 2024-05-20 DIAGNOSIS — R41841 Cognitive communication deficit: Secondary | ICD-10-CM | POA: Diagnosis not present

## 2024-05-20 DIAGNOSIS — E785 Hyperlipidemia, unspecified: Secondary | ICD-10-CM | POA: Diagnosis not present

## 2024-05-20 DIAGNOSIS — N186 End stage renal disease: Secondary | ICD-10-CM | POA: Diagnosis not present

## 2024-05-20 DIAGNOSIS — R1312 Dysphagia, oropharyngeal phase: Secondary | ICD-10-CM | POA: Diagnosis not present

## 2024-05-21 DIAGNOSIS — E039 Hypothyroidism, unspecified: Secondary | ICD-10-CM | POA: Diagnosis not present

## 2024-05-21 DIAGNOSIS — R1312 Dysphagia, oropharyngeal phase: Secondary | ICD-10-CM | POA: Diagnosis not present

## 2024-05-21 DIAGNOSIS — N186 End stage renal disease: Secondary | ICD-10-CM | POA: Diagnosis not present

## 2024-05-21 DIAGNOSIS — K219 Gastro-esophageal reflux disease without esophagitis: Secondary | ICD-10-CM | POA: Diagnosis not present

## 2024-05-21 DIAGNOSIS — F32A Depression, unspecified: Secondary | ICD-10-CM | POA: Diagnosis not present

## 2024-05-21 DIAGNOSIS — E785 Hyperlipidemia, unspecified: Secondary | ICD-10-CM | POA: Diagnosis not present

## 2024-05-21 DIAGNOSIS — G2581 Restless legs syndrome: Secondary | ICD-10-CM | POA: Diagnosis not present

## 2024-05-21 DIAGNOSIS — R41841 Cognitive communication deficit: Secondary | ICD-10-CM | POA: Diagnosis not present

## 2024-05-24 DIAGNOSIS — E785 Hyperlipidemia, unspecified: Secondary | ICD-10-CM | POA: Diagnosis not present

## 2024-05-24 DIAGNOSIS — E039 Hypothyroidism, unspecified: Secondary | ICD-10-CM | POA: Diagnosis not present

## 2024-05-24 DIAGNOSIS — N186 End stage renal disease: Secondary | ICD-10-CM | POA: Diagnosis not present

## 2024-05-24 DIAGNOSIS — F32A Depression, unspecified: Secondary | ICD-10-CM | POA: Diagnosis not present

## 2024-05-24 DIAGNOSIS — R1312 Dysphagia, oropharyngeal phase: Secondary | ICD-10-CM | POA: Diagnosis not present

## 2024-05-24 DIAGNOSIS — R41841 Cognitive communication deficit: Secondary | ICD-10-CM | POA: Diagnosis not present

## 2024-05-24 DIAGNOSIS — G2581 Restless legs syndrome: Secondary | ICD-10-CM | POA: Diagnosis not present

## 2024-05-24 DIAGNOSIS — K219 Gastro-esophageal reflux disease without esophagitis: Secondary | ICD-10-CM | POA: Diagnosis not present

## 2024-05-25 DIAGNOSIS — N186 End stage renal disease: Secondary | ICD-10-CM | POA: Diagnosis not present

## 2024-05-25 DIAGNOSIS — E785 Hyperlipidemia, unspecified: Secondary | ICD-10-CM | POA: Diagnosis not present

## 2024-05-25 DIAGNOSIS — R41841 Cognitive communication deficit: Secondary | ICD-10-CM | POA: Diagnosis not present

## 2024-05-25 DIAGNOSIS — F32A Depression, unspecified: Secondary | ICD-10-CM | POA: Diagnosis not present

## 2024-05-25 DIAGNOSIS — K219 Gastro-esophageal reflux disease without esophagitis: Secondary | ICD-10-CM | POA: Diagnosis not present

## 2024-05-25 DIAGNOSIS — R1312 Dysphagia, oropharyngeal phase: Secondary | ICD-10-CM | POA: Diagnosis not present

## 2024-05-25 DIAGNOSIS — G2581 Restless legs syndrome: Secondary | ICD-10-CM | POA: Diagnosis not present

## 2024-05-25 DIAGNOSIS — E039 Hypothyroidism, unspecified: Secondary | ICD-10-CM | POA: Diagnosis not present

## 2024-05-26 DIAGNOSIS — R41841 Cognitive communication deficit: Secondary | ICD-10-CM | POA: Diagnosis not present

## 2024-05-26 DIAGNOSIS — E785 Hyperlipidemia, unspecified: Secondary | ICD-10-CM | POA: Diagnosis not present

## 2024-05-26 DIAGNOSIS — G2581 Restless legs syndrome: Secondary | ICD-10-CM | POA: Diagnosis not present

## 2024-05-26 DIAGNOSIS — E039 Hypothyroidism, unspecified: Secondary | ICD-10-CM | POA: Diagnosis not present

## 2024-05-26 DIAGNOSIS — R1312 Dysphagia, oropharyngeal phase: Secondary | ICD-10-CM | POA: Diagnosis not present

## 2024-05-26 DIAGNOSIS — K219 Gastro-esophageal reflux disease without esophagitis: Secondary | ICD-10-CM | POA: Diagnosis not present

## 2024-05-26 DIAGNOSIS — F32A Depression, unspecified: Secondary | ICD-10-CM | POA: Diagnosis not present

## 2024-05-26 DIAGNOSIS — N186 End stage renal disease: Secondary | ICD-10-CM | POA: Diagnosis not present

## 2024-05-27 DIAGNOSIS — R1312 Dysphagia, oropharyngeal phase: Secondary | ICD-10-CM | POA: Diagnosis not present

## 2024-05-27 DIAGNOSIS — R41841 Cognitive communication deficit: Secondary | ICD-10-CM | POA: Diagnosis not present

## 2024-05-29 DIAGNOSIS — K219 Gastro-esophageal reflux disease without esophagitis: Secondary | ICD-10-CM | POA: Diagnosis not present

## 2024-05-29 DIAGNOSIS — G2581 Restless legs syndrome: Secondary | ICD-10-CM | POA: Diagnosis not present

## 2024-05-29 DIAGNOSIS — N186 End stage renal disease: Secondary | ICD-10-CM | POA: Diagnosis not present

## 2024-05-29 DIAGNOSIS — E785 Hyperlipidemia, unspecified: Secondary | ICD-10-CM | POA: Diagnosis not present

## 2024-05-29 DIAGNOSIS — R41841 Cognitive communication deficit: Secondary | ICD-10-CM | POA: Diagnosis not present

## 2024-05-29 DIAGNOSIS — F32A Depression, unspecified: Secondary | ICD-10-CM | POA: Diagnosis not present

## 2024-05-29 DIAGNOSIS — E039 Hypothyroidism, unspecified: Secondary | ICD-10-CM | POA: Diagnosis not present

## 2024-05-29 DIAGNOSIS — R1312 Dysphagia, oropharyngeal phase: Secondary | ICD-10-CM | POA: Diagnosis not present

## 2024-05-31 DIAGNOSIS — R1312 Dysphagia, oropharyngeal phase: Secondary | ICD-10-CM | POA: Diagnosis not present

## 2024-05-31 DIAGNOSIS — F32A Depression, unspecified: Secondary | ICD-10-CM | POA: Diagnosis not present

## 2024-05-31 DIAGNOSIS — E785 Hyperlipidemia, unspecified: Secondary | ICD-10-CM | POA: Diagnosis not present

## 2024-05-31 DIAGNOSIS — G2581 Restless legs syndrome: Secondary | ICD-10-CM | POA: Diagnosis not present

## 2024-05-31 DIAGNOSIS — N186 End stage renal disease: Secondary | ICD-10-CM | POA: Diagnosis not present

## 2024-05-31 DIAGNOSIS — R41841 Cognitive communication deficit: Secondary | ICD-10-CM | POA: Diagnosis not present

## 2024-05-31 DIAGNOSIS — K219 Gastro-esophageal reflux disease without esophagitis: Secondary | ICD-10-CM | POA: Diagnosis not present

## 2024-05-31 DIAGNOSIS — E039 Hypothyroidism, unspecified: Secondary | ICD-10-CM | POA: Diagnosis not present

## 2024-06-01 DIAGNOSIS — R1312 Dysphagia, oropharyngeal phase: Secondary | ICD-10-CM | POA: Diagnosis not present

## 2024-06-01 DIAGNOSIS — R41841 Cognitive communication deficit: Secondary | ICD-10-CM | POA: Diagnosis not present

## 2024-06-02 DIAGNOSIS — G2581 Restless legs syndrome: Secondary | ICD-10-CM | POA: Diagnosis not present

## 2024-06-02 DIAGNOSIS — N186 End stage renal disease: Secondary | ICD-10-CM | POA: Diagnosis not present

## 2024-06-02 DIAGNOSIS — E785 Hyperlipidemia, unspecified: Secondary | ICD-10-CM | POA: Diagnosis not present

## 2024-06-02 DIAGNOSIS — F32A Depression, unspecified: Secondary | ICD-10-CM | POA: Diagnosis not present

## 2024-06-02 DIAGNOSIS — K219 Gastro-esophageal reflux disease without esophagitis: Secondary | ICD-10-CM | POA: Diagnosis not present

## 2024-06-02 DIAGNOSIS — E039 Hypothyroidism, unspecified: Secondary | ICD-10-CM | POA: Diagnosis not present

## 2024-06-03 DIAGNOSIS — N186 End stage renal disease: Secondary | ICD-10-CM | POA: Diagnosis not present

## 2024-06-03 DIAGNOSIS — K219 Gastro-esophageal reflux disease without esophagitis: Secondary | ICD-10-CM | POA: Diagnosis not present

## 2024-06-03 DIAGNOSIS — G2581 Restless legs syndrome: Secondary | ICD-10-CM | POA: Diagnosis not present

## 2024-06-03 DIAGNOSIS — F32A Depression, unspecified: Secondary | ICD-10-CM | POA: Diagnosis not present

## 2024-06-03 DIAGNOSIS — R41841 Cognitive communication deficit: Secondary | ICD-10-CM | POA: Diagnosis not present

## 2024-06-03 DIAGNOSIS — R1312 Dysphagia, oropharyngeal phase: Secondary | ICD-10-CM | POA: Diagnosis not present

## 2024-06-03 DIAGNOSIS — E039 Hypothyroidism, unspecified: Secondary | ICD-10-CM | POA: Diagnosis not present

## 2024-06-03 DIAGNOSIS — E785 Hyperlipidemia, unspecified: Secondary | ICD-10-CM | POA: Diagnosis not present

## 2024-06-04 DIAGNOSIS — N186 End stage renal disease: Secondary | ICD-10-CM | POA: Diagnosis not present

## 2024-06-04 DIAGNOSIS — R1312 Dysphagia, oropharyngeal phase: Secondary | ICD-10-CM | POA: Diagnosis not present

## 2024-06-04 DIAGNOSIS — E785 Hyperlipidemia, unspecified: Secondary | ICD-10-CM | POA: Diagnosis not present

## 2024-06-04 DIAGNOSIS — G2581 Restless legs syndrome: Secondary | ICD-10-CM | POA: Diagnosis not present

## 2024-06-04 DIAGNOSIS — E039 Hypothyroidism, unspecified: Secondary | ICD-10-CM | POA: Diagnosis not present

## 2024-06-04 DIAGNOSIS — R41841 Cognitive communication deficit: Secondary | ICD-10-CM | POA: Diagnosis not present

## 2024-06-04 DIAGNOSIS — K219 Gastro-esophageal reflux disease without esophagitis: Secondary | ICD-10-CM | POA: Diagnosis not present

## 2024-06-04 DIAGNOSIS — F32A Depression, unspecified: Secondary | ICD-10-CM | POA: Diagnosis not present

## 2024-06-05 DIAGNOSIS — R1312 Dysphagia, oropharyngeal phase: Secondary | ICD-10-CM | POA: Diagnosis not present

## 2024-06-05 DIAGNOSIS — R41841 Cognitive communication deficit: Secondary | ICD-10-CM | POA: Diagnosis not present

## 2024-06-06 DIAGNOSIS — R1312 Dysphagia, oropharyngeal phase: Secondary | ICD-10-CM | POA: Diagnosis not present

## 2024-06-06 DIAGNOSIS — R41841 Cognitive communication deficit: Secondary | ICD-10-CM | POA: Diagnosis not present

## 2024-06-07 DIAGNOSIS — N186 End stage renal disease: Secondary | ICD-10-CM | POA: Diagnosis not present

## 2024-06-07 DIAGNOSIS — F32A Depression, unspecified: Secondary | ICD-10-CM | POA: Diagnosis not present

## 2024-06-07 DIAGNOSIS — E039 Hypothyroidism, unspecified: Secondary | ICD-10-CM | POA: Diagnosis not present

## 2024-06-07 DIAGNOSIS — E785 Hyperlipidemia, unspecified: Secondary | ICD-10-CM | POA: Diagnosis not present

## 2024-06-07 DIAGNOSIS — G2581 Restless legs syndrome: Secondary | ICD-10-CM | POA: Diagnosis not present

## 2024-06-07 DIAGNOSIS — K219 Gastro-esophageal reflux disease without esophagitis: Secondary | ICD-10-CM | POA: Diagnosis not present

## 2024-06-08 DIAGNOSIS — R1312 Dysphagia, oropharyngeal phase: Secondary | ICD-10-CM | POA: Diagnosis not present

## 2024-06-08 DIAGNOSIS — R41841 Cognitive communication deficit: Secondary | ICD-10-CM | POA: Diagnosis not present

## 2024-06-09 DIAGNOSIS — E039 Hypothyroidism, unspecified: Secondary | ICD-10-CM | POA: Diagnosis not present

## 2024-06-09 DIAGNOSIS — F32A Depression, unspecified: Secondary | ICD-10-CM | POA: Diagnosis not present

## 2024-06-09 DIAGNOSIS — K219 Gastro-esophageal reflux disease without esophagitis: Secondary | ICD-10-CM | POA: Diagnosis not present

## 2024-06-09 DIAGNOSIS — E785 Hyperlipidemia, unspecified: Secondary | ICD-10-CM | POA: Diagnosis not present

## 2024-06-09 DIAGNOSIS — R1312 Dysphagia, oropharyngeal phase: Secondary | ICD-10-CM | POA: Diagnosis not present

## 2024-06-09 DIAGNOSIS — G2581 Restless legs syndrome: Secondary | ICD-10-CM | POA: Diagnosis not present

## 2024-06-09 DIAGNOSIS — R41841 Cognitive communication deficit: Secondary | ICD-10-CM | POA: Diagnosis not present

## 2024-06-09 DIAGNOSIS — N186 End stage renal disease: Secondary | ICD-10-CM | POA: Diagnosis not present

## 2024-06-10 DIAGNOSIS — R41841 Cognitive communication deficit: Secondary | ICD-10-CM | POA: Diagnosis not present

## 2024-06-10 DIAGNOSIS — R1312 Dysphagia, oropharyngeal phase: Secondary | ICD-10-CM | POA: Diagnosis not present

## 2024-06-11 DIAGNOSIS — G3184 Mild cognitive impairment, so stated: Secondary | ICD-10-CM | POA: Diagnosis not present

## 2024-06-11 DIAGNOSIS — R41841 Cognitive communication deficit: Secondary | ICD-10-CM | POA: Diagnosis not present

## 2024-06-11 DIAGNOSIS — E785 Hyperlipidemia, unspecified: Secondary | ICD-10-CM | POA: Diagnosis not present

## 2024-06-11 DIAGNOSIS — N186 End stage renal disease: Secondary | ICD-10-CM | POA: Diagnosis not present

## 2024-06-11 DIAGNOSIS — G4700 Insomnia, unspecified: Secondary | ICD-10-CM | POA: Diagnosis not present

## 2024-06-11 DIAGNOSIS — E039 Hypothyroidism, unspecified: Secondary | ICD-10-CM | POA: Diagnosis not present

## 2024-06-11 DIAGNOSIS — F331 Major depressive disorder, recurrent, moderate: Secondary | ICD-10-CM | POA: Diagnosis not present

## 2024-06-11 DIAGNOSIS — F32A Depression, unspecified: Secondary | ICD-10-CM | POA: Diagnosis not present

## 2024-06-11 DIAGNOSIS — K219 Gastro-esophageal reflux disease without esophagitis: Secondary | ICD-10-CM | POA: Diagnosis not present

## 2024-06-11 DIAGNOSIS — R1312 Dysphagia, oropharyngeal phase: Secondary | ICD-10-CM | POA: Diagnosis not present

## 2024-06-11 DIAGNOSIS — G2581 Restless legs syndrome: Secondary | ICD-10-CM | POA: Diagnosis not present

## 2024-06-14 DIAGNOSIS — E039 Hypothyroidism, unspecified: Secondary | ICD-10-CM | POA: Diagnosis not present

## 2024-06-14 DIAGNOSIS — F32A Depression, unspecified: Secondary | ICD-10-CM | POA: Diagnosis not present

## 2024-06-14 DIAGNOSIS — R1312 Dysphagia, oropharyngeal phase: Secondary | ICD-10-CM | POA: Diagnosis not present

## 2024-06-14 DIAGNOSIS — K219 Gastro-esophageal reflux disease without esophagitis: Secondary | ICD-10-CM | POA: Diagnosis not present

## 2024-06-14 DIAGNOSIS — R41841 Cognitive communication deficit: Secondary | ICD-10-CM | POA: Diagnosis not present

## 2024-06-14 DIAGNOSIS — N186 End stage renal disease: Secondary | ICD-10-CM | POA: Diagnosis not present

## 2024-06-14 DIAGNOSIS — E785 Hyperlipidemia, unspecified: Secondary | ICD-10-CM | POA: Diagnosis not present

## 2024-06-14 DIAGNOSIS — G2581 Restless legs syndrome: Secondary | ICD-10-CM | POA: Diagnosis not present

## 2024-06-15 DIAGNOSIS — R41841 Cognitive communication deficit: Secondary | ICD-10-CM | POA: Diagnosis not present

## 2024-06-15 DIAGNOSIS — R1312 Dysphagia, oropharyngeal phase: Secondary | ICD-10-CM | POA: Diagnosis not present

## 2024-06-16 DIAGNOSIS — K219 Gastro-esophageal reflux disease without esophagitis: Secondary | ICD-10-CM | POA: Diagnosis not present

## 2024-06-16 DIAGNOSIS — G2581 Restless legs syndrome: Secondary | ICD-10-CM | POA: Diagnosis not present

## 2024-06-16 DIAGNOSIS — E785 Hyperlipidemia, unspecified: Secondary | ICD-10-CM | POA: Diagnosis not present

## 2024-06-16 DIAGNOSIS — N186 End stage renal disease: Secondary | ICD-10-CM | POA: Diagnosis not present

## 2024-06-16 DIAGNOSIS — R1312 Dysphagia, oropharyngeal phase: Secondary | ICD-10-CM | POA: Diagnosis not present

## 2024-06-16 DIAGNOSIS — F32A Depression, unspecified: Secondary | ICD-10-CM | POA: Diagnosis not present

## 2024-06-16 DIAGNOSIS — E039 Hypothyroidism, unspecified: Secondary | ICD-10-CM | POA: Diagnosis not present

## 2024-06-16 DIAGNOSIS — R41841 Cognitive communication deficit: Secondary | ICD-10-CM | POA: Diagnosis not present

## 2024-06-17 DIAGNOSIS — R1312 Dysphagia, oropharyngeal phase: Secondary | ICD-10-CM | POA: Diagnosis not present

## 2024-06-17 DIAGNOSIS — R41841 Cognitive communication deficit: Secondary | ICD-10-CM | POA: Diagnosis not present

## 2024-06-18 DIAGNOSIS — G2581 Restless legs syndrome: Secondary | ICD-10-CM | POA: Diagnosis not present

## 2024-06-18 DIAGNOSIS — E785 Hyperlipidemia, unspecified: Secondary | ICD-10-CM | POA: Diagnosis not present

## 2024-06-18 DIAGNOSIS — K219 Gastro-esophageal reflux disease without esophagitis: Secondary | ICD-10-CM | POA: Diagnosis not present

## 2024-06-18 DIAGNOSIS — E039 Hypothyroidism, unspecified: Secondary | ICD-10-CM | POA: Diagnosis not present

## 2024-06-18 DIAGNOSIS — R1312 Dysphagia, oropharyngeal phase: Secondary | ICD-10-CM | POA: Diagnosis not present

## 2024-06-18 DIAGNOSIS — G4700 Insomnia, unspecified: Secondary | ICD-10-CM | POA: Diagnosis not present

## 2024-06-18 DIAGNOSIS — G3184 Mild cognitive impairment, so stated: Secondary | ICD-10-CM | POA: Diagnosis not present

## 2024-06-18 DIAGNOSIS — F331 Major depressive disorder, recurrent, moderate: Secondary | ICD-10-CM | POA: Diagnosis not present

## 2024-06-18 DIAGNOSIS — N186 End stage renal disease: Secondary | ICD-10-CM | POA: Diagnosis not present

## 2024-06-18 DIAGNOSIS — F32A Depression, unspecified: Secondary | ICD-10-CM | POA: Diagnosis not present

## 2024-06-18 DIAGNOSIS — R41841 Cognitive communication deficit: Secondary | ICD-10-CM | POA: Diagnosis not present

## 2024-06-21 DIAGNOSIS — N186 End stage renal disease: Secondary | ICD-10-CM | POA: Diagnosis not present

## 2024-06-21 DIAGNOSIS — K219 Gastro-esophageal reflux disease without esophagitis: Secondary | ICD-10-CM | POA: Diagnosis not present

## 2024-06-21 DIAGNOSIS — R1312 Dysphagia, oropharyngeal phase: Secondary | ICD-10-CM | POA: Diagnosis not present

## 2024-06-21 DIAGNOSIS — E039 Hypothyroidism, unspecified: Secondary | ICD-10-CM | POA: Diagnosis not present

## 2024-06-21 DIAGNOSIS — R41841 Cognitive communication deficit: Secondary | ICD-10-CM | POA: Diagnosis not present

## 2024-06-21 DIAGNOSIS — F32A Depression, unspecified: Secondary | ICD-10-CM | POA: Diagnosis not present

## 2024-06-21 DIAGNOSIS — E785 Hyperlipidemia, unspecified: Secondary | ICD-10-CM | POA: Diagnosis not present

## 2024-06-21 DIAGNOSIS — G2581 Restless legs syndrome: Secondary | ICD-10-CM | POA: Diagnosis not present

## 2024-06-22 DIAGNOSIS — R1312 Dysphagia, oropharyngeal phase: Secondary | ICD-10-CM | POA: Diagnosis not present

## 2024-06-22 DIAGNOSIS — R41841 Cognitive communication deficit: Secondary | ICD-10-CM | POA: Diagnosis not present

## 2024-06-23 DIAGNOSIS — E785 Hyperlipidemia, unspecified: Secondary | ICD-10-CM | POA: Diagnosis not present

## 2024-06-23 DIAGNOSIS — E039 Hypothyroidism, unspecified: Secondary | ICD-10-CM | POA: Diagnosis not present

## 2024-06-23 DIAGNOSIS — K219 Gastro-esophageal reflux disease without esophagitis: Secondary | ICD-10-CM | POA: Diagnosis not present

## 2024-06-23 DIAGNOSIS — G2581 Restless legs syndrome: Secondary | ICD-10-CM | POA: Diagnosis not present

## 2024-06-23 DIAGNOSIS — R1312 Dysphagia, oropharyngeal phase: Secondary | ICD-10-CM | POA: Diagnosis not present

## 2024-06-23 DIAGNOSIS — R41841 Cognitive communication deficit: Secondary | ICD-10-CM | POA: Diagnosis not present

## 2024-06-23 DIAGNOSIS — F32A Depression, unspecified: Secondary | ICD-10-CM | POA: Diagnosis not present

## 2024-06-23 DIAGNOSIS — N186 End stage renal disease: Secondary | ICD-10-CM | POA: Diagnosis not present

## 2024-06-24 DIAGNOSIS — R41841 Cognitive communication deficit: Secondary | ICD-10-CM | POA: Diagnosis not present

## 2024-06-24 DIAGNOSIS — R1312 Dysphagia, oropharyngeal phase: Secondary | ICD-10-CM | POA: Diagnosis not present

## 2024-06-25 DIAGNOSIS — G4700 Insomnia, unspecified: Secondary | ICD-10-CM | POA: Diagnosis not present

## 2024-06-25 DIAGNOSIS — G2581 Restless legs syndrome: Secondary | ICD-10-CM | POA: Diagnosis not present

## 2024-06-25 DIAGNOSIS — E039 Hypothyroidism, unspecified: Secondary | ICD-10-CM | POA: Diagnosis not present

## 2024-06-25 DIAGNOSIS — F32A Depression, unspecified: Secondary | ICD-10-CM | POA: Diagnosis not present

## 2024-06-25 DIAGNOSIS — E785 Hyperlipidemia, unspecified: Secondary | ICD-10-CM | POA: Diagnosis not present

## 2024-06-25 DIAGNOSIS — R1312 Dysphagia, oropharyngeal phase: Secondary | ICD-10-CM | POA: Diagnosis not present

## 2024-06-25 DIAGNOSIS — G3184 Mild cognitive impairment, so stated: Secondary | ICD-10-CM | POA: Diagnosis not present

## 2024-06-25 DIAGNOSIS — F331 Major depressive disorder, recurrent, moderate: Secondary | ICD-10-CM | POA: Diagnosis not present

## 2024-06-25 DIAGNOSIS — N186 End stage renal disease: Secondary | ICD-10-CM | POA: Diagnosis not present

## 2024-06-25 DIAGNOSIS — K219 Gastro-esophageal reflux disease without esophagitis: Secondary | ICD-10-CM | POA: Diagnosis not present

## 2024-06-25 DIAGNOSIS — R41841 Cognitive communication deficit: Secondary | ICD-10-CM | POA: Diagnosis not present

## 2024-06-28 DIAGNOSIS — R1312 Dysphagia, oropharyngeal phase: Secondary | ICD-10-CM | POA: Diagnosis not present

## 2024-06-28 DIAGNOSIS — R41841 Cognitive communication deficit: Secondary | ICD-10-CM | POA: Diagnosis not present

## 2024-06-28 DIAGNOSIS — G2581 Restless legs syndrome: Secondary | ICD-10-CM | POA: Diagnosis not present

## 2024-06-28 DIAGNOSIS — F32A Depression, unspecified: Secondary | ICD-10-CM | POA: Diagnosis not present

## 2024-06-28 DIAGNOSIS — N186 End stage renal disease: Secondary | ICD-10-CM | POA: Diagnosis not present

## 2024-06-28 DIAGNOSIS — E039 Hypothyroidism, unspecified: Secondary | ICD-10-CM | POA: Diagnosis not present

## 2024-06-28 DIAGNOSIS — K219 Gastro-esophageal reflux disease without esophagitis: Secondary | ICD-10-CM | POA: Diagnosis not present

## 2024-06-28 DIAGNOSIS — E785 Hyperlipidemia, unspecified: Secondary | ICD-10-CM | POA: Diagnosis not present

## 2024-06-29 DIAGNOSIS — N186 End stage renal disease: Secondary | ICD-10-CM | POA: Diagnosis not present

## 2024-06-29 DIAGNOSIS — G2581 Restless legs syndrome: Secondary | ICD-10-CM | POA: Diagnosis not present

## 2024-06-29 DIAGNOSIS — E039 Hypothyroidism, unspecified: Secondary | ICD-10-CM | POA: Diagnosis not present

## 2024-06-29 DIAGNOSIS — R41841 Cognitive communication deficit: Secondary | ICD-10-CM | POA: Diagnosis not present

## 2024-06-29 DIAGNOSIS — R1312 Dysphagia, oropharyngeal phase: Secondary | ICD-10-CM | POA: Diagnosis not present

## 2024-06-29 DIAGNOSIS — F32A Depression, unspecified: Secondary | ICD-10-CM | POA: Diagnosis not present

## 2024-06-29 DIAGNOSIS — E785 Hyperlipidemia, unspecified: Secondary | ICD-10-CM | POA: Diagnosis not present

## 2024-06-29 DIAGNOSIS — K219 Gastro-esophageal reflux disease without esophagitis: Secondary | ICD-10-CM | POA: Diagnosis not present

## 2024-06-30 DIAGNOSIS — N186 End stage renal disease: Secondary | ICD-10-CM | POA: Diagnosis not present

## 2024-06-30 DIAGNOSIS — R41841 Cognitive communication deficit: Secondary | ICD-10-CM | POA: Diagnosis not present

## 2024-06-30 DIAGNOSIS — K219 Gastro-esophageal reflux disease without esophagitis: Secondary | ICD-10-CM | POA: Diagnosis not present

## 2024-06-30 DIAGNOSIS — F32A Depression, unspecified: Secondary | ICD-10-CM | POA: Diagnosis not present

## 2024-06-30 DIAGNOSIS — E785 Hyperlipidemia, unspecified: Secondary | ICD-10-CM | POA: Diagnosis not present

## 2024-06-30 DIAGNOSIS — R1312 Dysphagia, oropharyngeal phase: Secondary | ICD-10-CM | POA: Diagnosis not present

## 2024-06-30 DIAGNOSIS — E039 Hypothyroidism, unspecified: Secondary | ICD-10-CM | POA: Diagnosis not present

## 2024-06-30 DIAGNOSIS — G2581 Restless legs syndrome: Secondary | ICD-10-CM | POA: Diagnosis not present

## 2024-07-01 DIAGNOSIS — R1312 Dysphagia, oropharyngeal phase: Secondary | ICD-10-CM | POA: Diagnosis not present

## 2024-07-01 DIAGNOSIS — R41841 Cognitive communication deficit: Secondary | ICD-10-CM | POA: Diagnosis not present

## 2024-07-02 DIAGNOSIS — F32A Depression, unspecified: Secondary | ICD-10-CM | POA: Diagnosis not present

## 2024-07-02 DIAGNOSIS — R41841 Cognitive communication deficit: Secondary | ICD-10-CM | POA: Diagnosis not present

## 2024-07-02 DIAGNOSIS — G2581 Restless legs syndrome: Secondary | ICD-10-CM | POA: Diagnosis not present

## 2024-07-02 DIAGNOSIS — E039 Hypothyroidism, unspecified: Secondary | ICD-10-CM | POA: Diagnosis not present

## 2024-07-02 DIAGNOSIS — K219 Gastro-esophageal reflux disease without esophagitis: Secondary | ICD-10-CM | POA: Diagnosis not present

## 2024-07-02 DIAGNOSIS — R1312 Dysphagia, oropharyngeal phase: Secondary | ICD-10-CM | POA: Diagnosis not present

## 2024-07-02 DIAGNOSIS — N186 End stage renal disease: Secondary | ICD-10-CM | POA: Diagnosis not present

## 2024-07-02 DIAGNOSIS — E785 Hyperlipidemia, unspecified: Secondary | ICD-10-CM | POA: Diagnosis not present

## 2024-07-05 DIAGNOSIS — G2581 Restless legs syndrome: Secondary | ICD-10-CM | POA: Diagnosis not present

## 2024-07-05 DIAGNOSIS — R41841 Cognitive communication deficit: Secondary | ICD-10-CM | POA: Diagnosis not present

## 2024-07-05 DIAGNOSIS — E039 Hypothyroidism, unspecified: Secondary | ICD-10-CM | POA: Diagnosis not present

## 2024-07-05 DIAGNOSIS — R1312 Dysphagia, oropharyngeal phase: Secondary | ICD-10-CM | POA: Diagnosis not present

## 2024-07-05 DIAGNOSIS — N186 End stage renal disease: Secondary | ICD-10-CM | POA: Diagnosis not present

## 2024-07-05 DIAGNOSIS — F32A Depression, unspecified: Secondary | ICD-10-CM | POA: Diagnosis not present

## 2024-07-05 DIAGNOSIS — E785 Hyperlipidemia, unspecified: Secondary | ICD-10-CM | POA: Diagnosis not present

## 2024-07-05 DIAGNOSIS — K219 Gastro-esophageal reflux disease without esophagitis: Secondary | ICD-10-CM | POA: Diagnosis not present

## 2024-07-06 DIAGNOSIS — K219 Gastro-esophageal reflux disease without esophagitis: Secondary | ICD-10-CM | POA: Diagnosis not present

## 2024-07-06 DIAGNOSIS — E039 Hypothyroidism, unspecified: Secondary | ICD-10-CM | POA: Diagnosis not present

## 2024-07-06 DIAGNOSIS — R1312 Dysphagia, oropharyngeal phase: Secondary | ICD-10-CM | POA: Diagnosis not present

## 2024-07-06 DIAGNOSIS — N186 End stage renal disease: Secondary | ICD-10-CM | POA: Diagnosis not present

## 2024-07-06 DIAGNOSIS — R41841 Cognitive communication deficit: Secondary | ICD-10-CM | POA: Diagnosis not present

## 2024-07-06 DIAGNOSIS — R4181 Age-related cognitive decline: Secondary | ICD-10-CM | POA: Diagnosis not present

## 2024-07-06 DIAGNOSIS — G2581 Restless legs syndrome: Secondary | ICD-10-CM | POA: Diagnosis not present

## 2024-07-06 DIAGNOSIS — E785 Hyperlipidemia, unspecified: Secondary | ICD-10-CM | POA: Diagnosis not present

## 2024-07-06 DIAGNOSIS — F32A Depression, unspecified: Secondary | ICD-10-CM | POA: Diagnosis not present

## 2024-07-07 DIAGNOSIS — K219 Gastro-esophageal reflux disease without esophagitis: Secondary | ICD-10-CM | POA: Diagnosis not present

## 2024-07-07 DIAGNOSIS — E785 Hyperlipidemia, unspecified: Secondary | ICD-10-CM | POA: Diagnosis not present

## 2024-07-07 DIAGNOSIS — N186 End stage renal disease: Secondary | ICD-10-CM | POA: Diagnosis not present

## 2024-07-07 DIAGNOSIS — R41841 Cognitive communication deficit: Secondary | ICD-10-CM | POA: Diagnosis not present

## 2024-07-07 DIAGNOSIS — R1312 Dysphagia, oropharyngeal phase: Secondary | ICD-10-CM | POA: Diagnosis not present

## 2024-07-07 DIAGNOSIS — E039 Hypothyroidism, unspecified: Secondary | ICD-10-CM | POA: Diagnosis not present

## 2024-07-07 DIAGNOSIS — F32A Depression, unspecified: Secondary | ICD-10-CM | POA: Diagnosis not present

## 2024-07-07 DIAGNOSIS — G2581 Restless legs syndrome: Secondary | ICD-10-CM | POA: Diagnosis not present

## 2024-07-08 DIAGNOSIS — R1312 Dysphagia, oropharyngeal phase: Secondary | ICD-10-CM | POA: Diagnosis not present

## 2024-07-08 DIAGNOSIS — R41841 Cognitive communication deficit: Secondary | ICD-10-CM | POA: Diagnosis not present

## 2024-07-09 DIAGNOSIS — F32A Depression, unspecified: Secondary | ICD-10-CM | POA: Diagnosis not present

## 2024-07-09 DIAGNOSIS — G2581 Restless legs syndrome: Secondary | ICD-10-CM | POA: Diagnosis not present

## 2024-07-09 DIAGNOSIS — N186 End stage renal disease: Secondary | ICD-10-CM | POA: Diagnosis not present

## 2024-07-09 DIAGNOSIS — E039 Hypothyroidism, unspecified: Secondary | ICD-10-CM | POA: Diagnosis not present

## 2024-07-09 DIAGNOSIS — E785 Hyperlipidemia, unspecified: Secondary | ICD-10-CM | POA: Diagnosis not present

## 2024-07-09 DIAGNOSIS — K219 Gastro-esophageal reflux disease without esophagitis: Secondary | ICD-10-CM | POA: Diagnosis not present

## 2024-07-10 DIAGNOSIS — R41841 Cognitive communication deficit: Secondary | ICD-10-CM | POA: Diagnosis not present

## 2024-07-10 DIAGNOSIS — R1312 Dysphagia, oropharyngeal phase: Secondary | ICD-10-CM | POA: Diagnosis not present

## 2024-07-12 DIAGNOSIS — K219 Gastro-esophageal reflux disease without esophagitis: Secondary | ICD-10-CM | POA: Diagnosis not present

## 2024-07-12 DIAGNOSIS — R1312 Dysphagia, oropharyngeal phase: Secondary | ICD-10-CM | POA: Diagnosis not present

## 2024-07-12 DIAGNOSIS — E785 Hyperlipidemia, unspecified: Secondary | ICD-10-CM | POA: Diagnosis not present

## 2024-07-12 DIAGNOSIS — F32A Depression, unspecified: Secondary | ICD-10-CM | POA: Diagnosis not present

## 2024-07-12 DIAGNOSIS — G2581 Restless legs syndrome: Secondary | ICD-10-CM | POA: Diagnosis not present

## 2024-07-12 DIAGNOSIS — N186 End stage renal disease: Secondary | ICD-10-CM | POA: Diagnosis not present

## 2024-07-12 DIAGNOSIS — E039 Hypothyroidism, unspecified: Secondary | ICD-10-CM | POA: Diagnosis not present

## 2024-07-12 DIAGNOSIS — R41841 Cognitive communication deficit: Secondary | ICD-10-CM | POA: Diagnosis not present

## 2024-07-13 DIAGNOSIS — R1312 Dysphagia, oropharyngeal phase: Secondary | ICD-10-CM | POA: Diagnosis not present

## 2024-07-13 DIAGNOSIS — R41841 Cognitive communication deficit: Secondary | ICD-10-CM | POA: Diagnosis not present

## 2024-07-14 DIAGNOSIS — N186 End stage renal disease: Secondary | ICD-10-CM | POA: Diagnosis not present

## 2024-07-14 DIAGNOSIS — E039 Hypothyroidism, unspecified: Secondary | ICD-10-CM | POA: Diagnosis not present

## 2024-07-14 DIAGNOSIS — F32A Depression, unspecified: Secondary | ICD-10-CM | POA: Diagnosis not present

## 2024-07-14 DIAGNOSIS — G2581 Restless legs syndrome: Secondary | ICD-10-CM | POA: Diagnosis not present

## 2024-07-14 DIAGNOSIS — R41841 Cognitive communication deficit: Secondary | ICD-10-CM | POA: Diagnosis not present

## 2024-07-14 DIAGNOSIS — R1312 Dysphagia, oropharyngeal phase: Secondary | ICD-10-CM | POA: Diagnosis not present

## 2024-07-14 DIAGNOSIS — E785 Hyperlipidemia, unspecified: Secondary | ICD-10-CM | POA: Diagnosis not present

## 2024-07-14 DIAGNOSIS — K219 Gastro-esophageal reflux disease without esophagitis: Secondary | ICD-10-CM | POA: Diagnosis not present

## 2024-07-15 DIAGNOSIS — R1312 Dysphagia, oropharyngeal phase: Secondary | ICD-10-CM | POA: Diagnosis not present

## 2024-07-15 DIAGNOSIS — R41841 Cognitive communication deficit: Secondary | ICD-10-CM | POA: Diagnosis not present

## 2024-07-16 DIAGNOSIS — R1312 Dysphagia, oropharyngeal phase: Secondary | ICD-10-CM | POA: Diagnosis not present

## 2024-07-16 DIAGNOSIS — F331 Major depressive disorder, recurrent, moderate: Secondary | ICD-10-CM | POA: Diagnosis not present

## 2024-07-16 DIAGNOSIS — R41841 Cognitive communication deficit: Secondary | ICD-10-CM | POA: Diagnosis not present

## 2024-07-16 DIAGNOSIS — G4700 Insomnia, unspecified: Secondary | ICD-10-CM | POA: Diagnosis not present

## 2024-07-16 DIAGNOSIS — G3184 Mild cognitive impairment, so stated: Secondary | ICD-10-CM | POA: Diagnosis not present

## 2024-07-17 DIAGNOSIS — E785 Hyperlipidemia, unspecified: Secondary | ICD-10-CM | POA: Diagnosis not present

## 2024-07-17 DIAGNOSIS — K219 Gastro-esophageal reflux disease without esophagitis: Secondary | ICD-10-CM | POA: Diagnosis not present

## 2024-07-17 DIAGNOSIS — F32A Depression, unspecified: Secondary | ICD-10-CM | POA: Diagnosis not present

## 2024-07-17 DIAGNOSIS — E039 Hypothyroidism, unspecified: Secondary | ICD-10-CM | POA: Diagnosis not present

## 2024-07-17 DIAGNOSIS — G311 Senile degeneration of brain, not elsewhere classified: Secondary | ICD-10-CM | POA: Diagnosis not present

## 2024-07-17 DIAGNOSIS — G2581 Restless legs syndrome: Secondary | ICD-10-CM | POA: Diagnosis not present

## 2024-07-17 DIAGNOSIS — N186 End stage renal disease: Secondary | ICD-10-CM | POA: Diagnosis not present

## 2024-07-19 DIAGNOSIS — N186 End stage renal disease: Secondary | ICD-10-CM | POA: Diagnosis not present

## 2024-07-19 DIAGNOSIS — K219 Gastro-esophageal reflux disease without esophagitis: Secondary | ICD-10-CM | POA: Diagnosis not present

## 2024-07-19 DIAGNOSIS — F32A Depression, unspecified: Secondary | ICD-10-CM | POA: Diagnosis not present

## 2024-07-19 DIAGNOSIS — R41841 Cognitive communication deficit: Secondary | ICD-10-CM | POA: Diagnosis not present

## 2024-07-19 DIAGNOSIS — E785 Hyperlipidemia, unspecified: Secondary | ICD-10-CM | POA: Diagnosis not present

## 2024-07-19 DIAGNOSIS — G311 Senile degeneration of brain, not elsewhere classified: Secondary | ICD-10-CM | POA: Diagnosis not present

## 2024-07-19 DIAGNOSIS — E039 Hypothyroidism, unspecified: Secondary | ICD-10-CM | POA: Diagnosis not present

## 2024-07-19 DIAGNOSIS — R1312 Dysphagia, oropharyngeal phase: Secondary | ICD-10-CM | POA: Diagnosis not present

## 2024-07-20 DIAGNOSIS — R1312 Dysphagia, oropharyngeal phase: Secondary | ICD-10-CM | POA: Diagnosis not present

## 2024-07-20 DIAGNOSIS — R41841 Cognitive communication deficit: Secondary | ICD-10-CM | POA: Diagnosis not present

## 2024-07-21 DIAGNOSIS — R1312 Dysphagia, oropharyngeal phase: Secondary | ICD-10-CM | POA: Diagnosis not present

## 2024-07-21 DIAGNOSIS — G311 Senile degeneration of brain, not elsewhere classified: Secondary | ICD-10-CM | POA: Diagnosis not present

## 2024-07-21 DIAGNOSIS — E039 Hypothyroidism, unspecified: Secondary | ICD-10-CM | POA: Diagnosis not present

## 2024-07-21 DIAGNOSIS — N186 End stage renal disease: Secondary | ICD-10-CM | POA: Diagnosis not present

## 2024-07-21 DIAGNOSIS — F32A Depression, unspecified: Secondary | ICD-10-CM | POA: Diagnosis not present

## 2024-07-21 DIAGNOSIS — R41841 Cognitive communication deficit: Secondary | ICD-10-CM | POA: Diagnosis not present

## 2024-07-21 DIAGNOSIS — K219 Gastro-esophageal reflux disease without esophagitis: Secondary | ICD-10-CM | POA: Diagnosis not present

## 2024-07-21 DIAGNOSIS — E785 Hyperlipidemia, unspecified: Secondary | ICD-10-CM | POA: Diagnosis not present

## 2024-07-22 DIAGNOSIS — R1312 Dysphagia, oropharyngeal phase: Secondary | ICD-10-CM | POA: Diagnosis not present

## 2024-07-22 DIAGNOSIS — R41841 Cognitive communication deficit: Secondary | ICD-10-CM | POA: Diagnosis not present

## 2024-07-23 DIAGNOSIS — G311 Senile degeneration of brain, not elsewhere classified: Secondary | ICD-10-CM | POA: Diagnosis not present

## 2024-07-23 DIAGNOSIS — R1312 Dysphagia, oropharyngeal phase: Secondary | ICD-10-CM | POA: Diagnosis not present

## 2024-07-23 DIAGNOSIS — E785 Hyperlipidemia, unspecified: Secondary | ICD-10-CM | POA: Diagnosis not present

## 2024-07-23 DIAGNOSIS — K219 Gastro-esophageal reflux disease without esophagitis: Secondary | ICD-10-CM | POA: Diagnosis not present

## 2024-07-23 DIAGNOSIS — F32A Depression, unspecified: Secondary | ICD-10-CM | POA: Diagnosis not present

## 2024-07-23 DIAGNOSIS — N186 End stage renal disease: Secondary | ICD-10-CM | POA: Diagnosis not present

## 2024-07-23 DIAGNOSIS — E039 Hypothyroidism, unspecified: Secondary | ICD-10-CM | POA: Diagnosis not present

## 2024-07-23 DIAGNOSIS — R41841 Cognitive communication deficit: Secondary | ICD-10-CM | POA: Diagnosis not present

## 2024-07-26 DIAGNOSIS — E785 Hyperlipidemia, unspecified: Secondary | ICD-10-CM | POA: Diagnosis not present

## 2024-07-26 DIAGNOSIS — E039 Hypothyroidism, unspecified: Secondary | ICD-10-CM | POA: Diagnosis not present

## 2024-07-26 DIAGNOSIS — G311 Senile degeneration of brain, not elsewhere classified: Secondary | ICD-10-CM | POA: Diagnosis not present

## 2024-07-26 DIAGNOSIS — K219 Gastro-esophageal reflux disease without esophagitis: Secondary | ICD-10-CM | POA: Diagnosis not present

## 2024-07-26 DIAGNOSIS — R1312 Dysphagia, oropharyngeal phase: Secondary | ICD-10-CM | POA: Diagnosis not present

## 2024-07-26 DIAGNOSIS — N186 End stage renal disease: Secondary | ICD-10-CM | POA: Diagnosis not present

## 2024-07-26 DIAGNOSIS — F32A Depression, unspecified: Secondary | ICD-10-CM | POA: Diagnosis not present

## 2024-07-26 DIAGNOSIS — R41841 Cognitive communication deficit: Secondary | ICD-10-CM | POA: Diagnosis not present

## 2024-07-27 DIAGNOSIS — R1312 Dysphagia, oropharyngeal phase: Secondary | ICD-10-CM | POA: Diagnosis not present

## 2024-07-27 DIAGNOSIS — R41841 Cognitive communication deficit: Secondary | ICD-10-CM | POA: Diagnosis not present

## 2024-07-28 DIAGNOSIS — N186 End stage renal disease: Secondary | ICD-10-CM | POA: Diagnosis not present

## 2024-07-28 DIAGNOSIS — F32A Depression, unspecified: Secondary | ICD-10-CM | POA: Diagnosis not present

## 2024-07-28 DIAGNOSIS — R1312 Dysphagia, oropharyngeal phase: Secondary | ICD-10-CM | POA: Diagnosis not present

## 2024-07-28 DIAGNOSIS — G311 Senile degeneration of brain, not elsewhere classified: Secondary | ICD-10-CM | POA: Diagnosis not present

## 2024-07-28 DIAGNOSIS — E785 Hyperlipidemia, unspecified: Secondary | ICD-10-CM | POA: Diagnosis not present

## 2024-07-28 DIAGNOSIS — R41841 Cognitive communication deficit: Secondary | ICD-10-CM | POA: Diagnosis not present

## 2024-07-28 DIAGNOSIS — E039 Hypothyroidism, unspecified: Secondary | ICD-10-CM | POA: Diagnosis not present

## 2024-07-28 DIAGNOSIS — K219 Gastro-esophageal reflux disease without esophagitis: Secondary | ICD-10-CM | POA: Diagnosis not present

## 2024-07-29 DIAGNOSIS — R41841 Cognitive communication deficit: Secondary | ICD-10-CM | POA: Diagnosis not present

## 2024-07-29 DIAGNOSIS — R1312 Dysphagia, oropharyngeal phase: Secondary | ICD-10-CM | POA: Diagnosis not present

## 2024-07-30 DIAGNOSIS — E785 Hyperlipidemia, unspecified: Secondary | ICD-10-CM | POA: Diagnosis not present

## 2024-07-30 DIAGNOSIS — K219 Gastro-esophageal reflux disease without esophagitis: Secondary | ICD-10-CM | POA: Diagnosis not present

## 2024-07-30 DIAGNOSIS — E039 Hypothyroidism, unspecified: Secondary | ICD-10-CM | POA: Diagnosis not present

## 2024-07-30 DIAGNOSIS — N186 End stage renal disease: Secondary | ICD-10-CM | POA: Diagnosis not present

## 2024-07-30 DIAGNOSIS — G311 Senile degeneration of brain, not elsewhere classified: Secondary | ICD-10-CM | POA: Diagnosis not present

## 2024-07-30 DIAGNOSIS — F32A Depression, unspecified: Secondary | ICD-10-CM | POA: Diagnosis not present

## 2024-08-02 DIAGNOSIS — K219 Gastro-esophageal reflux disease without esophagitis: Secondary | ICD-10-CM | POA: Diagnosis not present

## 2024-08-02 DIAGNOSIS — E039 Hypothyroidism, unspecified: Secondary | ICD-10-CM | POA: Diagnosis not present

## 2024-08-02 DIAGNOSIS — N186 End stage renal disease: Secondary | ICD-10-CM | POA: Diagnosis not present

## 2024-08-02 DIAGNOSIS — G311 Senile degeneration of brain, not elsewhere classified: Secondary | ICD-10-CM | POA: Diagnosis not present

## 2024-08-02 DIAGNOSIS — E785 Hyperlipidemia, unspecified: Secondary | ICD-10-CM | POA: Diagnosis not present

## 2024-08-02 DIAGNOSIS — R1312 Dysphagia, oropharyngeal phase: Secondary | ICD-10-CM | POA: Diagnosis not present

## 2024-08-02 DIAGNOSIS — F32A Depression, unspecified: Secondary | ICD-10-CM | POA: Diagnosis not present

## 2024-08-02 DIAGNOSIS — R41841 Cognitive communication deficit: Secondary | ICD-10-CM | POA: Diagnosis not present

## 2024-08-03 DIAGNOSIS — R41841 Cognitive communication deficit: Secondary | ICD-10-CM | POA: Diagnosis not present

## 2024-08-03 DIAGNOSIS — R1312 Dysphagia, oropharyngeal phase: Secondary | ICD-10-CM | POA: Diagnosis not present

## 2024-08-04 DIAGNOSIS — F32A Depression, unspecified: Secondary | ICD-10-CM | POA: Diagnosis not present

## 2024-08-04 DIAGNOSIS — R1312 Dysphagia, oropharyngeal phase: Secondary | ICD-10-CM | POA: Diagnosis not present

## 2024-08-04 DIAGNOSIS — K219 Gastro-esophageal reflux disease without esophagitis: Secondary | ICD-10-CM | POA: Diagnosis not present

## 2024-08-04 DIAGNOSIS — E785 Hyperlipidemia, unspecified: Secondary | ICD-10-CM | POA: Diagnosis not present

## 2024-08-04 DIAGNOSIS — G311 Senile degeneration of brain, not elsewhere classified: Secondary | ICD-10-CM | POA: Diagnosis not present

## 2024-08-04 DIAGNOSIS — N186 End stage renal disease: Secondary | ICD-10-CM | POA: Diagnosis not present

## 2024-08-04 DIAGNOSIS — E039 Hypothyroidism, unspecified: Secondary | ICD-10-CM | POA: Diagnosis not present

## 2024-08-04 DIAGNOSIS — R41841 Cognitive communication deficit: Secondary | ICD-10-CM | POA: Diagnosis not present

## 2024-08-05 DIAGNOSIS — R1312 Dysphagia, oropharyngeal phase: Secondary | ICD-10-CM | POA: Diagnosis not present

## 2024-08-05 DIAGNOSIS — R41841 Cognitive communication deficit: Secondary | ICD-10-CM | POA: Diagnosis not present

## 2024-08-06 DIAGNOSIS — F32A Depression, unspecified: Secondary | ICD-10-CM | POA: Diagnosis not present

## 2024-08-06 DIAGNOSIS — E039 Hypothyroidism, unspecified: Secondary | ICD-10-CM | POA: Diagnosis not present

## 2024-08-06 DIAGNOSIS — K219 Gastro-esophageal reflux disease without esophagitis: Secondary | ICD-10-CM | POA: Diagnosis not present

## 2024-08-06 DIAGNOSIS — E785 Hyperlipidemia, unspecified: Secondary | ICD-10-CM | POA: Diagnosis not present

## 2024-08-06 DIAGNOSIS — G311 Senile degeneration of brain, not elsewhere classified: Secondary | ICD-10-CM | POA: Diagnosis not present

## 2024-08-06 DIAGNOSIS — N186 End stage renal disease: Secondary | ICD-10-CM | POA: Diagnosis not present

## 2024-08-07 DIAGNOSIS — R41841 Cognitive communication deficit: Secondary | ICD-10-CM | POA: Diagnosis not present

## 2024-08-07 DIAGNOSIS — R1312 Dysphagia, oropharyngeal phase: Secondary | ICD-10-CM | POA: Diagnosis not present

## 2024-08-09 DIAGNOSIS — R1312 Dysphagia, oropharyngeal phase: Secondary | ICD-10-CM | POA: Diagnosis not present

## 2024-08-09 DIAGNOSIS — R41841 Cognitive communication deficit: Secondary | ICD-10-CM | POA: Diagnosis not present

## 2024-08-09 DIAGNOSIS — N186 End stage renal disease: Secondary | ICD-10-CM | POA: Diagnosis not present

## 2024-08-09 DIAGNOSIS — F32A Depression, unspecified: Secondary | ICD-10-CM | POA: Diagnosis not present

## 2024-08-09 DIAGNOSIS — K219 Gastro-esophageal reflux disease without esophagitis: Secondary | ICD-10-CM | POA: Diagnosis not present

## 2024-08-09 DIAGNOSIS — E785 Hyperlipidemia, unspecified: Secondary | ICD-10-CM | POA: Diagnosis not present

## 2024-08-09 DIAGNOSIS — G311 Senile degeneration of brain, not elsewhere classified: Secondary | ICD-10-CM | POA: Diagnosis not present

## 2024-08-09 DIAGNOSIS — E039 Hypothyroidism, unspecified: Secondary | ICD-10-CM | POA: Diagnosis not present

## 2024-08-10 DIAGNOSIS — R1312 Dysphagia, oropharyngeal phase: Secondary | ICD-10-CM | POA: Diagnosis not present

## 2024-08-10 DIAGNOSIS — R41841 Cognitive communication deficit: Secondary | ICD-10-CM | POA: Diagnosis not present

## 2024-08-10 DIAGNOSIS — K219 Gastro-esophageal reflux disease without esophagitis: Secondary | ICD-10-CM | POA: Diagnosis not present

## 2024-08-10 DIAGNOSIS — E039 Hypothyroidism, unspecified: Secondary | ICD-10-CM | POA: Diagnosis not present

## 2024-08-10 DIAGNOSIS — N186 End stage renal disease: Secondary | ICD-10-CM | POA: Diagnosis not present

## 2024-08-11 DIAGNOSIS — E039 Hypothyroidism, unspecified: Secondary | ICD-10-CM | POA: Diagnosis not present

## 2024-08-11 DIAGNOSIS — F32A Depression, unspecified: Secondary | ICD-10-CM | POA: Diagnosis not present

## 2024-08-11 DIAGNOSIS — R1312 Dysphagia, oropharyngeal phase: Secondary | ICD-10-CM | POA: Diagnosis not present

## 2024-08-11 DIAGNOSIS — N186 End stage renal disease: Secondary | ICD-10-CM | POA: Diagnosis not present

## 2024-08-11 DIAGNOSIS — K219 Gastro-esophageal reflux disease without esophagitis: Secondary | ICD-10-CM | POA: Diagnosis not present

## 2024-08-11 DIAGNOSIS — R41841 Cognitive communication deficit: Secondary | ICD-10-CM | POA: Diagnosis not present

## 2024-08-11 DIAGNOSIS — E785 Hyperlipidemia, unspecified: Secondary | ICD-10-CM | POA: Diagnosis not present

## 2024-08-11 DIAGNOSIS — G311 Senile degeneration of brain, not elsewhere classified: Secondary | ICD-10-CM | POA: Diagnosis not present

## 2024-08-12 DIAGNOSIS — R1312 Dysphagia, oropharyngeal phase: Secondary | ICD-10-CM | POA: Diagnosis not present

## 2024-08-12 DIAGNOSIS — R41841 Cognitive communication deficit: Secondary | ICD-10-CM | POA: Diagnosis not present

## 2024-08-13 DIAGNOSIS — E039 Hypothyroidism, unspecified: Secondary | ICD-10-CM | POA: Diagnosis not present

## 2024-08-13 DIAGNOSIS — R41841 Cognitive communication deficit: Secondary | ICD-10-CM | POA: Diagnosis not present

## 2024-08-13 DIAGNOSIS — N186 End stage renal disease: Secondary | ICD-10-CM | POA: Diagnosis not present

## 2024-08-13 DIAGNOSIS — K219 Gastro-esophageal reflux disease without esophagitis: Secondary | ICD-10-CM | POA: Diagnosis not present

## 2024-08-13 DIAGNOSIS — E785 Hyperlipidemia, unspecified: Secondary | ICD-10-CM | POA: Diagnosis not present

## 2024-08-13 DIAGNOSIS — R1312 Dysphagia, oropharyngeal phase: Secondary | ICD-10-CM | POA: Diagnosis not present

## 2024-08-13 DIAGNOSIS — F32A Depression, unspecified: Secondary | ICD-10-CM | POA: Diagnosis not present

## 2024-08-13 DIAGNOSIS — G311 Senile degeneration of brain, not elsewhere classified: Secondary | ICD-10-CM | POA: Diagnosis not present

## 2024-08-14 DIAGNOSIS — R41841 Cognitive communication deficit: Secondary | ICD-10-CM | POA: Diagnosis not present

## 2024-08-14 DIAGNOSIS — R1312 Dysphagia, oropharyngeal phase: Secondary | ICD-10-CM | POA: Diagnosis not present

## 2024-08-16 DIAGNOSIS — F330 Major depressive disorder, recurrent, mild: Secondary | ICD-10-CM | POA: Diagnosis not present

## 2024-08-16 DIAGNOSIS — F03A0 Unspecified dementia, mild, without behavioral disturbance, psychotic disturbance, mood disturbance, and anxiety: Secondary | ICD-10-CM | POA: Diagnosis not present

## 2024-08-16 DIAGNOSIS — F5101 Primary insomnia: Secondary | ICD-10-CM | POA: Diagnosis not present

## 2024-08-20 DIAGNOSIS — R790 Abnormal level of blood mineral: Secondary | ICD-10-CM | POA: Diagnosis not present

## 2024-08-20 DIAGNOSIS — N186 End stage renal disease: Secondary | ICD-10-CM | POA: Diagnosis not present

## 2024-08-20 DIAGNOSIS — I1310 Hypertensive heart and chronic kidney disease without heart failure, with stage 1 through stage 4 chronic kidney disease, or unspecified chronic kidney disease: Secondary | ICD-10-CM | POA: Diagnosis not present

## 2024-08-20 DIAGNOSIS — E038 Other specified hypothyroidism: Secondary | ICD-10-CM | POA: Diagnosis not present

## 2024-08-20 DIAGNOSIS — K219 Gastro-esophageal reflux disease without esophagitis: Secondary | ICD-10-CM | POA: Diagnosis not present
